# Patient Record
Sex: Female | Born: 1940 | Race: White | Hispanic: No | Smoking: Never smoker
Health system: Southern US, Community
[De-identification: ages and names within clinical notes are randomized; demographics above are authoritative.]

## PROBLEM LIST (undated history)

## (undated) DIAGNOSIS — F329 Major depressive disorder, single episode, unspecified: Secondary | ICD-10-CM

## (undated) DIAGNOSIS — K219 Gastro-esophageal reflux disease without esophagitis: Secondary | ICD-10-CM

## (undated) DIAGNOSIS — Z923 Personal history of irradiation: Secondary | ICD-10-CM

## (undated) DIAGNOSIS — G4700 Insomnia, unspecified: Secondary | ICD-10-CM

## (undated) DIAGNOSIS — K5909 Other constipation: Secondary | ICD-10-CM

## (undated) DIAGNOSIS — I4891 Unspecified atrial fibrillation: Secondary | ICD-10-CM

## (undated) DIAGNOSIS — I10 Essential (primary) hypertension: Secondary | ICD-10-CM

## (undated) DIAGNOSIS — I429 Cardiomyopathy, unspecified: Secondary | ICD-10-CM

## (undated) DIAGNOSIS — I071 Rheumatic tricuspid insufficiency: Secondary | ICD-10-CM

## (undated) DIAGNOSIS — C50919 Malignant neoplasm of unspecified site of unspecified female breast: Secondary | ICD-10-CM

## (undated) DIAGNOSIS — M1990 Unspecified osteoarthritis, unspecified site: Secondary | ICD-10-CM

## (undated) DIAGNOSIS — I340 Nonrheumatic mitral (valve) insufficiency: Secondary | ICD-10-CM

## (undated) DIAGNOSIS — F32A Depression, unspecified: Secondary | ICD-10-CM

## (undated) HISTORY — PX: VAGINAL HYSTERECTOMY: SHX2639

## (undated) HISTORY — PX: CATARACT EXTRACTION, BILATERAL: SHX1313

## (undated) HISTORY — DX: Insomnia, unspecified: G47.00

## (undated) HISTORY — PX: TONSILLECTOMY: SUR1361

## (undated) HISTORY — PX: LAPAROSCOPIC CHOLECYSTECTOMY: SUR755

## (undated) HISTORY — PX: BREAST SURGERY: SHX581

## (undated) HISTORY — PX: ABDOMINAL HYSTERECTOMY: SHX81

## (undated) HISTORY — DX: Depression, unspecified: F32.A

## (undated) HISTORY — DX: Major depressive disorder, single episode, unspecified: F32.9

## (undated) HISTORY — DX: Essential (primary) hypertension: I10

## (undated) HISTORY — DX: Gastro-esophageal reflux disease without esophagitis: K21.9

---

## 2003-07-06 LAB — HM MAMMOGRAPHY

## 2003-07-12 ENCOUNTER — Other Ambulatory Visit: Payer: Self-pay

## 2003-10-22 ENCOUNTER — Ambulatory Visit: Payer: Self-pay | Admitting: Radiation Oncology

## 2003-11-04 ENCOUNTER — Ambulatory Visit: Payer: Self-pay | Admitting: Unknown Physician Specialty

## 2003-11-22 ENCOUNTER — Ambulatory Visit: Payer: Self-pay | Admitting: Radiation Oncology

## 2004-01-04 ENCOUNTER — Ambulatory Visit: Payer: Self-pay | Admitting: General Surgery

## 2004-01-11 ENCOUNTER — Ambulatory Visit: Payer: Self-pay | Admitting: Oncology

## 2004-01-12 ENCOUNTER — Ambulatory Visit: Payer: Self-pay | Admitting: Unknown Physician Specialty

## 2004-01-12 LAB — HM COLONOSCOPY

## 2004-01-22 DIAGNOSIS — C50919 Malignant neoplasm of unspecified site of unspecified female breast: Secondary | ICD-10-CM

## 2004-01-22 DIAGNOSIS — Z923 Personal history of irradiation: Secondary | ICD-10-CM

## 2004-01-22 HISTORY — PX: BREAST BIOPSY: SHX20

## 2004-01-22 HISTORY — DX: Personal history of irradiation: Z92.3

## 2004-01-22 HISTORY — DX: Malignant neoplasm of unspecified site of unspecified female breast: C50.919

## 2004-01-22 HISTORY — PX: BREAST LUMPECTOMY: SHX2

## 2004-03-29 ENCOUNTER — Ambulatory Visit: Payer: Self-pay | Admitting: Oncology

## 2004-06-22 ENCOUNTER — Ambulatory Visit: Payer: Self-pay | Admitting: General Surgery

## 2004-07-11 ENCOUNTER — Ambulatory Visit: Payer: Self-pay | Admitting: Oncology

## 2004-10-04 ENCOUNTER — Ambulatory Visit: Payer: Self-pay | Admitting: Oncology

## 2005-01-04 ENCOUNTER — Ambulatory Visit: Payer: Self-pay | Admitting: General Surgery

## 2005-01-28 ENCOUNTER — Ambulatory Visit: Payer: Self-pay | Admitting: Oncology

## 2005-07-02 ENCOUNTER — Ambulatory Visit: Payer: Self-pay | Admitting: General Surgery

## 2005-07-15 ENCOUNTER — Ambulatory Visit: Payer: Self-pay | Admitting: Oncology

## 2007-06-17 ENCOUNTER — Ambulatory Visit: Payer: Self-pay | Admitting: Family Medicine

## 2008-01-07 ENCOUNTER — Ambulatory Visit: Payer: Self-pay | Admitting: Family Medicine

## 2010-09-16 ENCOUNTER — Ambulatory Visit: Payer: Self-pay | Admitting: Family Medicine

## 2011-02-25 DIAGNOSIS — M549 Dorsalgia, unspecified: Secondary | ICD-10-CM | POA: Diagnosis not present

## 2011-02-25 DIAGNOSIS — I10 Essential (primary) hypertension: Secondary | ICD-10-CM | POA: Diagnosis not present

## 2011-02-25 DIAGNOSIS — N390 Urinary tract infection, site not specified: Secondary | ICD-10-CM | POA: Diagnosis not present

## 2011-02-25 DIAGNOSIS — E785 Hyperlipidemia, unspecified: Secondary | ICD-10-CM | POA: Diagnosis not present

## 2011-02-25 DIAGNOSIS — K219 Gastro-esophageal reflux disease without esophagitis: Secondary | ICD-10-CM | POA: Diagnosis not present

## 2011-02-25 DIAGNOSIS — F329 Major depressive disorder, single episode, unspecified: Secondary | ICD-10-CM | POA: Diagnosis not present

## 2011-05-06 DIAGNOSIS — I10 Essential (primary) hypertension: Secondary | ICD-10-CM | POA: Diagnosis not present

## 2011-05-06 DIAGNOSIS — E785 Hyperlipidemia, unspecified: Secondary | ICD-10-CM | POA: Diagnosis not present

## 2011-06-05 DIAGNOSIS — H524 Presbyopia: Secondary | ICD-10-CM | POA: Diagnosis not present

## 2011-06-05 DIAGNOSIS — H04129 Dry eye syndrome of unspecified lacrimal gland: Secondary | ICD-10-CM | POA: Diagnosis not present

## 2011-06-05 DIAGNOSIS — Z961 Presence of intraocular lens: Secondary | ICD-10-CM | POA: Diagnosis not present

## 2011-08-19 DIAGNOSIS — Z853 Personal history of malignant neoplasm of breast: Secondary | ICD-10-CM | POA: Diagnosis not present

## 2011-08-19 DIAGNOSIS — D0590 Unspecified type of carcinoma in situ of unspecified breast: Secondary | ICD-10-CM | POA: Diagnosis not present

## 2011-08-19 DIAGNOSIS — I10 Essential (primary) hypertension: Secondary | ICD-10-CM | POA: Diagnosis not present

## 2011-08-19 DIAGNOSIS — F329 Major depressive disorder, single episode, unspecified: Secondary | ICD-10-CM | POA: Diagnosis not present

## 2011-08-19 DIAGNOSIS — M129 Arthropathy, unspecified: Secondary | ICD-10-CM | POA: Diagnosis not present

## 2011-08-19 DIAGNOSIS — N952 Postmenopausal atrophic vaginitis: Secondary | ICD-10-CM | POA: Diagnosis not present

## 2011-08-19 DIAGNOSIS — Z09 Encounter for follow-up examination after completed treatment for conditions other than malignant neoplasm: Secondary | ICD-10-CM | POA: Diagnosis not present

## 2011-12-04 DIAGNOSIS — F329 Major depressive disorder, single episode, unspecified: Secondary | ICD-10-CM | POA: Diagnosis not present

## 2011-12-04 DIAGNOSIS — E785 Hyperlipidemia, unspecified: Secondary | ICD-10-CM | POA: Diagnosis not present

## 2011-12-04 DIAGNOSIS — I10 Essential (primary) hypertension: Secondary | ICD-10-CM | POA: Diagnosis not present

## 2011-12-04 DIAGNOSIS — K219 Gastro-esophageal reflux disease without esophagitis: Secondary | ICD-10-CM | POA: Diagnosis not present

## 2012-06-17 DIAGNOSIS — H04129 Dry eye syndrome of unspecified lacrimal gland: Secondary | ICD-10-CM | POA: Diagnosis not present

## 2012-06-17 DIAGNOSIS — Z961 Presence of intraocular lens: Secondary | ICD-10-CM | POA: Diagnosis not present

## 2012-06-17 DIAGNOSIS — H524 Presbyopia: Secondary | ICD-10-CM | POA: Diagnosis not present

## 2012-08-20 DIAGNOSIS — E785 Hyperlipidemia, unspecified: Secondary | ICD-10-CM | POA: Diagnosis not present

## 2012-08-20 DIAGNOSIS — K219 Gastro-esophageal reflux disease without esophagitis: Secondary | ICD-10-CM | POA: Diagnosis not present

## 2012-08-20 DIAGNOSIS — F329 Major depressive disorder, single episode, unspecified: Secondary | ICD-10-CM | POA: Diagnosis not present

## 2012-08-20 DIAGNOSIS — E7800 Pure hypercholesterolemia, unspecified: Secondary | ICD-10-CM | POA: Diagnosis not present

## 2012-08-20 DIAGNOSIS — I10 Essential (primary) hypertension: Secondary | ICD-10-CM | POA: Diagnosis not present

## 2012-09-07 DIAGNOSIS — J040 Acute laryngitis: Secondary | ICD-10-CM | POA: Diagnosis not present

## 2012-09-07 DIAGNOSIS — J40 Bronchitis, not specified as acute or chronic: Secondary | ICD-10-CM | POA: Diagnosis not present

## 2012-11-25 ENCOUNTER — Ambulatory Visit: Payer: Self-pay | Admitting: Family Medicine

## 2012-11-25 DIAGNOSIS — M5137 Other intervertebral disc degeneration, lumbosacral region: Secondary | ICD-10-CM | POA: Diagnosis not present

## 2012-11-25 DIAGNOSIS — IMO0002 Reserved for concepts with insufficient information to code with codable children: Secondary | ICD-10-CM | POA: Diagnosis not present

## 2012-11-25 DIAGNOSIS — M47817 Spondylosis without myelopathy or radiculopathy, lumbosacral region: Secondary | ICD-10-CM | POA: Diagnosis not present

## 2012-12-10 DIAGNOSIS — IMO0002 Reserved for concepts with insufficient information to code with codable children: Secondary | ICD-10-CM | POA: Diagnosis not present

## 2012-12-10 DIAGNOSIS — M461 Sacroiliitis, not elsewhere classified: Secondary | ICD-10-CM | POA: Diagnosis not present

## 2012-12-10 DIAGNOSIS — B370 Candidal stomatitis: Secondary | ICD-10-CM | POA: Diagnosis not present

## 2013-01-01 DIAGNOSIS — J029 Acute pharyngitis, unspecified: Secondary | ICD-10-CM | POA: Diagnosis not present

## 2013-01-01 DIAGNOSIS — Z23 Encounter for immunization: Secondary | ICD-10-CM | POA: Diagnosis not present

## 2013-01-01 DIAGNOSIS — L439 Lichen planus, unspecified: Secondary | ICD-10-CM | POA: Diagnosis not present

## 2013-01-01 DIAGNOSIS — L259 Unspecified contact dermatitis, unspecified cause: Secondary | ICD-10-CM | POA: Diagnosis not present

## 2013-01-01 DIAGNOSIS — E785 Hyperlipidemia, unspecified: Secondary | ICD-10-CM | POA: Diagnosis not present

## 2013-01-01 DIAGNOSIS — Z79899 Other long term (current) drug therapy: Secondary | ICD-10-CM | POA: Diagnosis not present

## 2013-01-01 LAB — HEMOGLOBIN A1C: Hgb A1c MFr Bld: 5.4 % (ref 4.0–6.0)

## 2013-05-11 DIAGNOSIS — I10 Essential (primary) hypertension: Secondary | ICD-10-CM | POA: Diagnosis not present

## 2013-05-11 DIAGNOSIS — F329 Major depressive disorder, single episode, unspecified: Secondary | ICD-10-CM | POA: Diagnosis not present

## 2013-05-11 DIAGNOSIS — E785 Hyperlipidemia, unspecified: Secondary | ICD-10-CM | POA: Diagnosis not present

## 2013-05-11 DIAGNOSIS — K219 Gastro-esophageal reflux disease without esophagitis: Secondary | ICD-10-CM | POA: Diagnosis not present

## 2013-05-11 DIAGNOSIS — F3289 Other specified depressive episodes: Secondary | ICD-10-CM | POA: Diagnosis not present

## 2013-05-28 DIAGNOSIS — D1039 Benign neoplasm of other parts of mouth: Secondary | ICD-10-CM | POA: Diagnosis not present

## 2013-06-03 DIAGNOSIS — L439 Lichen planus, unspecified: Secondary | ICD-10-CM | POA: Diagnosis not present

## 2013-06-04 DIAGNOSIS — L821 Other seborrheic keratosis: Secondary | ICD-10-CM | POA: Diagnosis not present

## 2013-06-04 DIAGNOSIS — H16139 Photokeratitis, unspecified eye: Secondary | ICD-10-CM | POA: Diagnosis not present

## 2013-06-07 DIAGNOSIS — L439 Lichen planus, unspecified: Secondary | ICD-10-CM | POA: Diagnosis not present

## 2013-07-12 DIAGNOSIS — D485 Neoplasm of uncertain behavior of skin: Secondary | ICD-10-CM | POA: Diagnosis not present

## 2013-07-12 DIAGNOSIS — C44319 Basal cell carcinoma of skin of other parts of face: Secondary | ICD-10-CM | POA: Diagnosis not present

## 2013-07-12 DIAGNOSIS — L570 Actinic keratosis: Secondary | ICD-10-CM | POA: Diagnosis not present

## 2013-07-12 DIAGNOSIS — L821 Other seborrheic keratosis: Secondary | ICD-10-CM | POA: Diagnosis not present

## 2013-07-16 ENCOUNTER — Ambulatory Visit: Payer: Self-pay | Admitting: Family Medicine

## 2013-07-16 DIAGNOSIS — Z7982 Long term (current) use of aspirin: Secondary | ICD-10-CM | POA: Diagnosis not present

## 2013-07-16 DIAGNOSIS — J329 Chronic sinusitis, unspecified: Secondary | ICD-10-CM | POA: Diagnosis not present

## 2013-07-16 DIAGNOSIS — Z79899 Other long term (current) drug therapy: Secondary | ICD-10-CM | POA: Diagnosis not present

## 2013-08-30 DIAGNOSIS — C44319 Basal cell carcinoma of skin of other parts of face: Secondary | ICD-10-CM | POA: Diagnosis not present

## 2013-10-11 DIAGNOSIS — C4491 Basal cell carcinoma of skin, unspecified: Secondary | ICD-10-CM | POA: Diagnosis not present

## 2013-10-11 DIAGNOSIS — D485 Neoplasm of uncertain behavior of skin: Secondary | ICD-10-CM | POA: Diagnosis not present

## 2013-10-11 DIAGNOSIS — Z85828 Personal history of other malignant neoplasm of skin: Secondary | ICD-10-CM | POA: Diagnosis not present

## 2013-10-11 DIAGNOSIS — Z1283 Encounter for screening for malignant neoplasm of skin: Secondary | ICD-10-CM | POA: Diagnosis not present

## 2013-10-11 DIAGNOSIS — L570 Actinic keratosis: Secondary | ICD-10-CM | POA: Diagnosis not present

## 2013-10-18 DIAGNOSIS — L570 Actinic keratosis: Secondary | ICD-10-CM | POA: Diagnosis not present

## 2013-11-15 DIAGNOSIS — L570 Actinic keratosis: Secondary | ICD-10-CM | POA: Diagnosis not present

## 2013-12-02 ENCOUNTER — Ambulatory Visit: Payer: Self-pay | Admitting: Family Medicine

## 2013-12-02 DIAGNOSIS — G8929 Other chronic pain: Secondary | ICD-10-CM | POA: Diagnosis not present

## 2013-12-02 DIAGNOSIS — E784 Other hyperlipidemia: Secondary | ICD-10-CM | POA: Diagnosis not present

## 2013-12-02 DIAGNOSIS — I10 Essential (primary) hypertension: Secondary | ICD-10-CM | POA: Diagnosis not present

## 2013-12-02 DIAGNOSIS — F329 Major depressive disorder, single episode, unspecified: Secondary | ICD-10-CM | POA: Diagnosis not present

## 2013-12-02 DIAGNOSIS — Z780 Asymptomatic menopausal state: Secondary | ICD-10-CM | POA: Diagnosis not present

## 2013-12-02 DIAGNOSIS — M1990 Unspecified osteoarthritis, unspecified site: Secondary | ICD-10-CM | POA: Diagnosis not present

## 2013-12-02 DIAGNOSIS — J329 Chronic sinusitis, unspecified: Secondary | ICD-10-CM | POA: Diagnosis not present

## 2013-12-02 DIAGNOSIS — K219 Gastro-esophageal reflux disease without esophagitis: Secondary | ICD-10-CM | POA: Diagnosis not present

## 2013-12-02 DIAGNOSIS — M549 Dorsalgia, unspecified: Secondary | ICD-10-CM | POA: Diagnosis not present

## 2013-12-02 DIAGNOSIS — M5135 Other intervertebral disc degeneration, thoracolumbar region: Secondary | ICD-10-CM | POA: Diagnosis not present

## 2013-12-02 DIAGNOSIS — M545 Low back pain: Secondary | ICD-10-CM | POA: Diagnosis not present

## 2013-12-02 DIAGNOSIS — M5136 Other intervertebral disc degeneration, lumbar region: Secondary | ICD-10-CM | POA: Diagnosis not present

## 2013-12-02 LAB — LIPID PANEL
Cholesterol: 215 mg/dL — AB (ref 0–200)
HDL: 74 mg/dL — AB (ref 35–70)
LDL Cholesterol: 106 mg/dL
Triglycerides: 174 mg/dL — AB (ref 40–160)

## 2013-12-02 LAB — BASIC METABOLIC PANEL
BUN: 12 mg/dL (ref 4–21)
Creatinine: 0.8 mg/dL (ref <=1.1)
Glucose: 72 mg/dL

## 2014-01-24 DIAGNOSIS — Z08 Encounter for follow-up examination after completed treatment for malignant neoplasm: Secondary | ICD-10-CM | POA: Diagnosis not present

## 2014-01-24 DIAGNOSIS — L570 Actinic keratosis: Secondary | ICD-10-CM | POA: Diagnosis not present

## 2014-01-24 DIAGNOSIS — Z85828 Personal history of other malignant neoplasm of skin: Secondary | ICD-10-CM | POA: Diagnosis not present

## 2014-01-24 DIAGNOSIS — C44311 Basal cell carcinoma of skin of nose: Secondary | ICD-10-CM | POA: Diagnosis not present

## 2014-01-24 DIAGNOSIS — Z1283 Encounter for screening for malignant neoplasm of skin: Secondary | ICD-10-CM | POA: Diagnosis not present

## 2014-01-31 DIAGNOSIS — L570 Actinic keratosis: Secondary | ICD-10-CM | POA: Diagnosis not present

## 2014-05-19 DIAGNOSIS — F339 Major depressive disorder, recurrent, unspecified: Secondary | ICD-10-CM | POA: Diagnosis not present

## 2014-05-19 DIAGNOSIS — Z634 Disappearance and death of family member: Secondary | ICD-10-CM | POA: Diagnosis not present

## 2014-05-19 DIAGNOSIS — G4700 Insomnia, unspecified: Secondary | ICD-10-CM | POA: Diagnosis not present

## 2014-06-06 DIAGNOSIS — F4321 Adjustment disorder with depressed mood: Secondary | ICD-10-CM | POA: Insufficient documentation

## 2014-06-06 DIAGNOSIS — F339 Major depressive disorder, recurrent, unspecified: Secondary | ICD-10-CM | POA: Insufficient documentation

## 2014-06-06 DIAGNOSIS — K219 Gastro-esophageal reflux disease without esophagitis: Secondary | ICD-10-CM | POA: Insufficient documentation

## 2014-06-06 DIAGNOSIS — E7849 Other hyperlipidemia: Secondary | ICD-10-CM | POA: Insufficient documentation

## 2014-06-06 DIAGNOSIS — M1990 Unspecified osteoarthritis, unspecified site: Secondary | ICD-10-CM | POA: Insufficient documentation

## 2014-06-06 DIAGNOSIS — L309 Dermatitis, unspecified: Secondary | ICD-10-CM | POA: Insufficient documentation

## 2014-06-06 DIAGNOSIS — Z780 Asymptomatic menopausal state: Secondary | ICD-10-CM | POA: Insufficient documentation

## 2014-06-06 DIAGNOSIS — G4700 Insomnia, unspecified: Secondary | ICD-10-CM | POA: Insufficient documentation

## 2014-06-06 DIAGNOSIS — I10 Essential (primary) hypertension: Secondary | ICD-10-CM | POA: Insufficient documentation

## 2014-06-30 ENCOUNTER — Ambulatory Visit (INDEPENDENT_AMBULATORY_CARE_PROVIDER_SITE_OTHER): Payer: Medicare Other | Admitting: Family Medicine

## 2014-06-30 ENCOUNTER — Encounter: Payer: Self-pay | Admitting: Family Medicine

## 2014-06-30 VITALS — BP 138/80 | HR 78 | Ht 65.0 in | Wt 221.0 lb

## 2014-06-30 DIAGNOSIS — F32A Depression, unspecified: Secondary | ICD-10-CM

## 2014-06-30 DIAGNOSIS — F339 Major depressive disorder, recurrent, unspecified: Secondary | ICD-10-CM | POA: Diagnosis not present

## 2014-06-30 DIAGNOSIS — G4700 Insomnia, unspecified: Secondary | ICD-10-CM

## 2014-06-30 DIAGNOSIS — F329 Major depressive disorder, single episode, unspecified: Secondary | ICD-10-CM | POA: Diagnosis not present

## 2014-06-30 MED ORDER — ZOLPIDEM TARTRATE 5 MG PO TABS: 5.0000 mg | ORAL_TABLET | Freq: Every day | ORAL | Status: DC

## 2014-06-30 MED ORDER — SERTRALINE HCL 100 MG PO TABS: 100.0000 mg | ORAL_TABLET | Freq: Every day | ORAL | Status: DC

## 2014-06-30 NOTE — Progress Notes (Signed)
Name: Melissa Chase   MRN: [id number]    DOB: [birth year]   Date:06/30/2014       Progress Note  Subjective  Chief Complaint  Chief Complaint  Patient presents with  . Depression    med helping- "just feel jittery"    Mental Health Problem The primary symptoms include dysphoric mood. The primary symptoms do not include delusions, hallucinations, bizarre behavior, disorganized speech, negative symptoms or somatic symptoms. The current episode started more than 1 month ago.  The onset of the illness is precipitated by emotional stress. The degree of incapacity that she is experiencing as a consequence of her illness is moderate. Additional symptoms of the illness include insomnia. Additional symptoms of the illness do not include no anhedonia, no hypersomnia, no appetite change, no unexpected weight change, no fatigue, no agitation, no psychomotor retardation, no feelings of worthlessness, no attention impairment, no euphoric mood, no increased goal-directed activity, no flight of ideas, no inflated self-esteem, no decreased need for sleep, not distractible, no poor judgment, no visual change, no headaches, no abdominal pain or no seizures. She does not admit to suicidal ideas. She does not have a plan to commit suicide. She does not contemplate harming herself.    No problem-specific assessment & plan notes found for this encounter.   No past medical history on file.  Past Surgical History  Procedure Laterality Date  . Vaginal hysterectomy    . Laparoscopic cholecystectomy    . Tonsillectomy      No family history on file.  History   Social History  . Marital Status: Married    Spouse Name: N/A  . Number of Children: N/A  . Years of Education: N/A   Occupational History  . Not on file.   Social History Main Topics  . Smoking status: Never Smoker   . Smokeless tobacco: Not on file  . Alcohol Use: 0.0 oz/week    0 Standard drinks or equivalent per week  . Drug Use: No  .  Sexual Activity: Not on file   Other Topics Concern  . Not on file   Social History Narrative    No Known Allergies   Review of Systems  Constitutional: Negative for appetite change, fatigue and unexpected weight change.  Gastrointestinal: Negative for abdominal pain.  Neurological: Negative for seizures and headaches.  Psychiatric/Behavioral: Positive for dysphoric mood. Negative for hallucinations and agitation. The patient has insomnia.      Objective  Filed Vitals:   06/30/14 1513  BP: 138/80  Pulse: 78  Height: 5\' 5"  (1.651 m)  Weight: 221 lb (100.245 kg)    Physical Exam    No results found for this or any previous visit (from the past 2160 hour(s)).   Assessment & Plan  Problem List Items Addressed This Visit      Other   Recurrent major depressive episodes   Relevant Medications   sertraline (ZOLOFT) 100 MG tablet    Other Visit Diagnoses    Depression    -  Primary    Relevant Medications    sertraline (ZOLOFT) 100 MG tablet    Insomnia        Relevant Medications    zolpidem (AMBIEN) 5 MG tablet         Dr. [facility]  06/30/2014

## 2014-07-08 ENCOUNTER — Other Ambulatory Visit: Payer: Self-pay | Admitting: Family Medicine

## 2014-07-08 DIAGNOSIS — K219 Gastro-esophageal reflux disease without esophagitis: Secondary | ICD-10-CM

## 2014-07-10 ENCOUNTER — Other Ambulatory Visit: Payer: Self-pay | Admitting: Family Medicine

## 2014-07-10 DIAGNOSIS — M2550 Pain in unspecified joint: Secondary | ICD-10-CM

## 2014-07-20 DIAGNOSIS — H0289 Other specified disorders of eyelid: Secondary | ICD-10-CM | POA: Diagnosis not present

## 2014-07-20 DIAGNOSIS — H5213 Myopia, bilateral: Secondary | ICD-10-CM | POA: Diagnosis not present

## 2014-08-11 ENCOUNTER — Ambulatory Visit (INDEPENDENT_AMBULATORY_CARE_PROVIDER_SITE_OTHER): Payer: Medicare Other | Admitting: Family Medicine

## 2014-08-11 VITALS — BP 115/65 | HR 89 | Temp 98.0°F | Resp 16 | Wt 210.0 lb

## 2014-08-11 DIAGNOSIS — F339 Major depressive disorder, recurrent, unspecified: Secondary | ICD-10-CM | POA: Diagnosis not present

## 2014-08-11 DIAGNOSIS — F329 Major depressive disorder, single episode, unspecified: Secondary | ICD-10-CM | POA: Diagnosis not present

## 2014-08-11 DIAGNOSIS — F4321 Adjustment disorder with depressed mood: Secondary | ICD-10-CM

## 2014-08-11 DIAGNOSIS — F32A Depression, unspecified: Secondary | ICD-10-CM

## 2014-08-11 DIAGNOSIS — G4700 Insomnia, unspecified: Secondary | ICD-10-CM | POA: Diagnosis not present

## 2014-08-11 MED ORDER — ZOLPIDEM TARTRATE 5 MG PO TABS: 5.0000 mg | ORAL_TABLET | Freq: Every day | ORAL | Status: DC

## 2014-08-11 MED ORDER — SERTRALINE HCL 100 MG PO TABS: 100.0000 mg | ORAL_TABLET | Freq: Every day | ORAL | Status: DC

## 2014-08-11 NOTE — Progress Notes (Signed)
Name: Melissa Chase   MRN: [id number]    DOB: 07-12-1940   Date:08/11/2014       Progress Note  Subjective  Chief Complaint  Chief Complaint  Patient presents with  . Follow-up    Mental Health Problem The primary symptoms include dysphoric mood. The primary symptoms do not include delusions, hallucinations, bizarre behavior, disorganized speech, negative symptoms or somatic symptoms. The current episode started more than 1 month ago. This is a recurrent problem.  The onset of the illness is precipitated by a stressful event. The degree of incapacity that she is experiencing as a consequence of her illness is mild. Additional symptoms of the illness include anhedonia. Additional symptoms of the illness do not include no insomnia, no hypersomnia, no appetite change, no unexpected weight change, no fatigue, no agitation, no psychomotor retardation, no feelings of worthlessness, no attention impairment, no euphoric mood, no increased goal-directed activity, no flight of ideas, no inflated self-esteem, no decreased need for sleep, not distractible, no poor judgment, no visual change, no headaches, no abdominal pain or no seizures. She does not admit to suicidal ideas. She does not have a plan to commit suicide. She does not contemplate harming herself. She has not already injured self. She does not contemplate injuring another person. She has not already  injured another person.    No problem-specific assessment & plan notes found for this encounter.   No past medical history on file.  Past Surgical History  Procedure Laterality Date  . Vaginal hysterectomy    . Laparoscopic cholecystectomy    . Tonsillectomy      No family history on file.  History   Social History  . Marital Status: Married    Spouse Name: N/A  . Number of Children: N/A  . Years of Education: N/A   Occupational History  . Not on file.   Social History Main Topics  . Smoking status: Never Smoker   .  Smokeless tobacco: Not on file  . Alcohol Use: 0.0 oz/week    0 Standard drinks or equivalent per week  . Drug Use: No  . Sexual Activity: Not on file   Other Topics Concern  . Not on file   Social History Narrative    No Known Allergies   Review of Systems  Constitutional: Negative for fever, chills, weight loss, malaise/fatigue, appetite change, fatigue and unexpected weight change.  HENT: Negative for ear discharge, ear pain and sore throat.   Eyes: Negative for blurred vision.  Respiratory: Negative for cough, sputum production, shortness of breath and wheezing.   Cardiovascular: Negative for chest pain, palpitations and leg swelling.  Gastrointestinal: Negative for heartburn, nausea, abdominal pain, diarrhea, constipation, blood in stool and melena.  Genitourinary: Negative for dysuria, urgency, frequency and hematuria.  Musculoskeletal: Negative for myalgias, back pain, joint pain and neck pain.  Skin: Negative for rash.  Neurological: Negative for dizziness, tingling, sensory change, focal weakness, seizures and headaches.  Endo/Heme/Allergies: Negative for environmental allergies and polydipsia. Does not bruise/bleed easily.  Psychiatric/Behavioral: Positive for dysphoric mood. Negative for depression, suicidal ideas, hallucinations and agitation. The patient is not nervous/anxious and does not have insomnia.      Objective  Filed Vitals:   08/11/14 1412  BP: 115/65  Pulse: 89  Temp: 98 F (36.7 C)  Resp: 16  Weight: 210 lb (95.255 kg)  SpO2: 97%    Physical Exam  Constitutional: She is well-developed, well-nourished, and in no distress. No distress.  HENT:  Head:  Normocephalic and atraumatic.  Right Ear: External ear normal.  Left Ear: External ear normal.  Nose: Nose normal.  Mouth/Throat: Oropharynx is clear and moist.  Eyes: Conjunctivae and EOM are normal. Pupils are equal, round, and reactive to light. Right eye exhibits no discharge. Left eye  exhibits no discharge.  Neck: Normal range of motion. Neck supple. No JVD present. No thyromegaly present.  Cardiovascular: Normal rate, regular rhythm, normal heart sounds and intact distal pulses.  Exam reveals no gallop and no friction rub.   No murmur heard. Pulmonary/Chest: Effort normal and breath sounds normal.  Abdominal: Soft. Bowel sounds are normal. She exhibits no mass. There is no tenderness. There is no guarding.  Musculoskeletal: Normal range of motion. She exhibits no edema.  Lymphadenopathy:    She has no cervical adenopathy.  Neurological: She is alert. She has normal reflexes.  Skin: Skin is warm and dry. She is not diaphoretic.  Psychiatric: Mood and affect normal.      Assessment & Plan  Problem List Items Addressed This Visit      Other   Aggrieved - Primary   Recurrent major depressive episodes   Relevant Medications   sertraline (ZOLOFT) 100 MG tablet   Cannot sleep    Other Visit Diagnoses    Depression        Relevant Medications    sertraline (ZOLOFT) 100 MG tablet    Insomnia        Relevant Medications    zolpidem (AMBIEN) 5 MG tablet         Dr. Deanna [facility]  08/11/2014

## 2014-08-12 ENCOUNTER — Encounter: Payer: Self-pay | Admitting: Family Medicine

## 2014-08-12 ENCOUNTER — Other Ambulatory Visit: Payer: Self-pay

## 2014-08-15 ENCOUNTER — Other Ambulatory Visit: Payer: Self-pay

## 2014-10-09 ENCOUNTER — Other Ambulatory Visit: Payer: Self-pay | Admitting: Family Medicine

## 2014-10-09 DIAGNOSIS — I10 Essential (primary) hypertension: Secondary | ICD-10-CM

## 2014-10-10 ENCOUNTER — Encounter: Payer: Self-pay | Admitting: Family Medicine

## 2014-10-10 ENCOUNTER — Ambulatory Visit (INDEPENDENT_AMBULATORY_CARE_PROVIDER_SITE_OTHER): Payer: Medicare Other | Admitting: Family Medicine

## 2014-10-10 VITALS — BP 120/70 | HR 80 | Ht 65.0 in | Wt 231.0 lb

## 2014-10-10 DIAGNOSIS — F32A Depression, unspecified: Secondary | ICD-10-CM

## 2014-10-10 DIAGNOSIS — G4700 Insomnia, unspecified: Secondary | ICD-10-CM

## 2014-10-10 DIAGNOSIS — F339 Major depressive disorder, recurrent, unspecified: Secondary | ICD-10-CM | POA: Diagnosis not present

## 2014-10-10 DIAGNOSIS — F329 Major depressive disorder, single episode, unspecified: Secondary | ICD-10-CM | POA: Diagnosis not present

## 2014-10-10 MED ORDER — ZOLPIDEM TARTRATE 5 MG PO TABS: 5.0000 mg | ORAL_TABLET | Freq: Every day | ORAL | Status: DC

## 2014-10-10 MED ORDER — SERTRALINE HCL 100 MG PO TABS: 100.0000 mg | ORAL_TABLET | Freq: Every day | ORAL | Status: DC

## 2014-10-10 NOTE — Progress Notes (Signed)
Name: Melissa Chase   MRN: [id number]    DOB: 1940/04/02   Date:10/10/2014       Progress Note  Subjective  Chief Complaint  Chief Complaint  Patient presents with  . Depression  . Insomnia    Depression        This is a chronic problem.  The current episode started more than 1 year ago.   The onset quality is gradual.   The problem occurs intermittently.  The problem has been gradually improving since onset.  Associated symptoms include insomnia.  Associated symptoms include no decreased concentration, no fatigue, no helplessness, no hopelessness, not irritable, no restlessness, no decreased interest, no appetite change, no body aches, no myalgias, no headaches, no indigestion and no suicidal ideas.     The symptoms are aggravated by nothing.  Past treatments include SSRIs - Selective serotonin reuptake inhibitors.  Compliance with treatment is good.  Previous treatment provided mild relief.   Pertinent negatives include no chronic fatigue syndrome, no chronic pain, no fibromyalgia, no chronic illness and no obsessive-compulsive disorder. Insomnia Primary symptoms: difficulty falling asleep, somnolence, frequent awakening, malaise/fatigue.  The current episode started more than one month. The onset quality is gradual. The problem occurs nightly. The problem has been gradually improving ("better than I was") since onset. The symptoms are aggravated by anxiety. Past treatments include medication. The treatment provided significant relief. Typical bedtime:  11-12 P.M..  How long after going to bed to you fall asleep: 15-30 minutes.   PMH includes: depression, family stress or anxiety.    No problem-specific assessment & plan notes found for this encounter.   Past Medical History  Diagnosis Date  . Depression   . Insomnia   . Hypertension   . GERD (gastroesophageal reflux disease)     Past Surgical History  Procedure Laterality Date  . Vaginal hysterectomy    . Laparoscopic  cholecystectomy    . Tonsillectomy      Family History  Problem Relation Age of Onset  . Diabetes Sister     Social History   Social History  . Marital Status: Married    Spouse Name: N/A  . Number of Children: N/A  . Years of Education: N/A   Occupational History  . Not on file.   Social History Main Topics  . Smoking status: Never Smoker   . Smokeless tobacco: Not on file  . Alcohol Use: 0.0 oz/week    0 Standard drinks or equivalent per week  . Drug Use: No  . Sexual Activity: No   Other Topics Concern  . Not on file   Social History Narrative    No Known Allergies   Review of Systems  Constitutional: Positive for malaise/fatigue. Negative for fever, chills, weight loss, appetite change and fatigue.  HENT: Negative for ear discharge, ear pain and sore throat.   Eyes: Negative for blurred vision.  Respiratory: Negative for cough, sputum production, shortness of breath and wheezing.   Cardiovascular: Negative for chest pain, palpitations and leg swelling.  Gastrointestinal: Negative for heartburn, nausea, abdominal pain, diarrhea, constipation, blood in stool and melena.  Genitourinary: Negative for dysuria, urgency, frequency and hematuria.  Musculoskeletal: Negative for myalgias, back pain, joint pain and neck pain.  Skin: Negative for rash.  Neurological: Negative for dizziness, tingling, sensory change, focal weakness and headaches.  Endo/Heme/Allergies: Negative for environmental allergies and polydipsia. Does not bruise/bleed easily.  Psychiatric/Behavioral: Positive for depression. Negative for suicidal ideas and decreased concentration. The patient has insomnia.  The patient is not nervous/anxious.      Objective  Filed Vitals:   10/10/14 1340  BP: 120/70  Pulse: 80  Height: 5\' 5"  (1.651 m)  Weight: 231 lb (104.781 kg)    Physical Exam  Constitutional: She is well-developed, well-nourished, and in no distress. She is not irritable. No distress.   HENT:  Head: Normocephalic and atraumatic.  Right Ear: External ear normal.  Left Ear: External ear normal.  Nose: Nose normal.  Mouth/Throat: Oropharynx is clear and moist.  Eyes: Conjunctivae and EOM are normal. Pupils are equal, round, and reactive to light. Right eye exhibits no discharge. Left eye exhibits no discharge.  Neck: Normal range of motion. Neck supple. No JVD present. No thyromegaly present.  Cardiovascular: Normal rate, regular rhythm, normal heart sounds and intact distal pulses.  Exam reveals no gallop and no friction rub.   No murmur heard. Pulmonary/Chest: Effort normal and breath sounds normal.  Abdominal: Soft. Bowel sounds are normal. She exhibits no mass. There is no tenderness. There is no guarding.  Musculoskeletal: Normal range of motion. She exhibits no edema.  Lymphadenopathy:    She has no cervical adenopathy.  Neurological: She is alert. She has normal reflexes.  Skin: Skin is warm and dry. She is not diaphoretic.  Psychiatric: Mood and affect normal.  Nursing note and vitals reviewed.     Assessment & Plan  Problem List Items Addressed This Visit      Other   Recurrent major depressive episodes - Primary   Relevant Medications   sertraline (ZOLOFT) 100 MG tablet   Cannot sleep    Other Visit Diagnoses    Depression        Relevant Medications    sertraline (ZOLOFT) 100 MG tablet    Insomnia        Relevant Medications    zolpidem (AMBIEN) 5 MG tablet         Dr. [facility]  10/10/2014

## 2014-10-10 NOTE — Patient Instructions (Signed)
Insomnia Insomnia is frequent trouble falling and/or staying asleep. Insomnia can be a long term problem or a short term problem. Both are common. Insomnia can be a short term problem when the wakefulness is related to a certain stress or worry. Long term insomnia is often related to ongoing stress during waking hours and/or poor sleeping habits. Overtime, sleep deprivation itself can make the problem worse. Every little thing feels more severe because you are overtired and your ability to cope is decreased. CAUSES   Stress, anxiety, and depression.  Poor sleeping habits.  Distractions such as TV in the bedroom.  Naps close to bedtime.  Engaging in emotionally charged conversations before bed.  Technical reading before sleep.  Alcohol and other sedatives. They may make the problem worse. They can hurt normal sleep patterns and normal dream activity.  Stimulants such as caffeine for several hours prior to bedtime.  Pain syndromes and shortness of breath can cause insomnia.  Exercise late at night.  Changing time zones may cause sleeping problems (jet lag). It is sometimes helpful to have someone observe your sleeping patterns. They should look for periods of not breathing during the night (sleep apnea). They should also look to see how long those periods last. If you live alone or observers are uncertain, you can also be observed at a sleep clinic where your sleep patterns will be professionally monitored. Sleep apnea requires a checkup and treatment. Give your caregivers your medical history. Give your caregivers observations your family has made about your sleep.  SYMPTOMS   Not feeling rested in the morning.  Anxiety and restlessness at bedtime.  Difficulty falling and staying asleep. TREATMENT   Your caregiver may prescribe treatment for an underlying medical disorders. Your caregiver can give advice or help if you are using alcohol or other drugs for self-medication. Treatment  of underlying problems will usually eliminate insomnia problems.  Medications can be prescribed for short time use. They are generally not recommended for lengthy use.  Over-the-counter sleep medicines are not recommended for lengthy use. They can be habit forming.  You can promote easier sleeping by making lifestyle changes such as:  Using relaxation techniques that help with breathing and reduce muscle tension.  Exercising earlier in the day.  Changing your diet and the time of your last meal. No night time snacks.  Establish a regular time to go to bed.  Counseling can help with stressful problems and worry.  Soothing music and white noise may be helpful if there are background noises you cannot remove.  Stop tedious detailed work at least one hour before bedtime. HOME CARE INSTRUCTIONS   Keep a diary. Inform your caregiver about your progress. This includes any medication side effects. See your caregiver regularly. Take note of:  Times when you are asleep.  Times when you are awake during the night.  The quality of your sleep.  How you feel the next day. This information will help your caregiver care for you.  Get out of bed if you are still awake after 15 minutes. Read or do some quiet activity. Keep the lights down. Wait until you feel sleepy and go back to bed.  Keep regular sleeping and waking hours. Avoid naps.  Exercise regularly.  Avoid distractions at bedtime. Distractions include watching television or engaging in any intense or detailed activity like attempting to balance the household checkbook.  Develop a bedtime ritual. Keep a familiar routine of bathing, brushing your teeth, climbing into bed at the same   time each night, listening to soothing music. Routines increase the success of falling to sleep faster.  Use relaxation techniques. This can be using breathing and muscle tension release routines. It can also include visualizing peaceful scenes. You can  also help control troubling or intruding thoughts by keeping your mind occupied with boring or repetitive thoughts like the old concept of counting sheep. You can make it more creative like imagining planting one beautiful flower after another in your backyard garden.  During your day, work to eliminate stress. When this is not possible use some of the previous suggestions to help reduce the anxiety that accompanies stressful situations. MAKE SURE YOU:   Understand these instructions.  Will watch your condition.  Will get help right away if you are not doing well or get worse. Document Released: 01/05/2000 Document Revised: 04/01/2011 Document Reviewed: 02/04/2007 Exit[employer] Patient Information 2015 [employer]. This information is not intended to replace advice given to you by your health care provider. Make sure you discuss any questions you have with your health care provider.  

## 2014-11-07 ENCOUNTER — Other Ambulatory Visit: Payer: Self-pay | Admitting: Family Medicine

## 2014-12-05 ENCOUNTER — Encounter: Payer: Self-pay | Admitting: Family Medicine

## 2014-12-05 ENCOUNTER — Ambulatory Visit (INDEPENDENT_AMBULATORY_CARE_PROVIDER_SITE_OTHER): Payer: Medicare Other | Admitting: Family Medicine

## 2014-12-05 VITALS — BP 118/70 | HR 80 | Ht 65.0 in | Wt 228.0 lb

## 2014-12-05 DIAGNOSIS — Z1211 Encounter for screening for malignant neoplasm of colon: Secondary | ICD-10-CM | POA: Diagnosis not present

## 2014-12-05 DIAGNOSIS — Z1231 Encounter for screening mammogram for malignant neoplasm of breast: Secondary | ICD-10-CM

## 2014-12-05 DIAGNOSIS — R195 Other fecal abnormalities: Secondary | ICD-10-CM | POA: Diagnosis not present

## 2014-12-05 DIAGNOSIS — E785 Hyperlipidemia, unspecified: Secondary | ICD-10-CM | POA: Diagnosis not present

## 2014-12-05 DIAGNOSIS — Z1239 Encounter for other screening for malignant neoplasm of breast: Secondary | ICD-10-CM | POA: Diagnosis not present

## 2014-12-05 DIAGNOSIS — Z0000 Encounter for general adult medical examination without abnormal findings: Secondary | ICD-10-CM

## 2014-12-05 DIAGNOSIS — I872 Venous insufficiency (chronic) (peripheral): Secondary | ICD-10-CM | POA: Diagnosis not present

## 2014-12-05 DIAGNOSIS — Z13220 Encounter for screening for lipoid disorders: Secondary | ICD-10-CM

## 2014-12-05 DIAGNOSIS — Z23 Encounter for immunization: Secondary | ICD-10-CM | POA: Diagnosis not present

## 2014-12-05 LAB — POCT URINALYSIS DIPSTICK
Bilirubin, UA: NEGATIVE
Blood, UA: NEGATIVE
Glucose, UA: NEGATIVE
Ketones, UA: NEGATIVE
Leukocytes, UA: NEGATIVE
Nitrite, UA: NEGATIVE
Protein, UA: NEGATIVE
Spec Grav, UA: 1.02
Urobilinogen, UA: 0.2
pH, UA: 6

## 2014-12-05 LAB — HEMOCCULT GUIAC POC 1CARD (OFFICE): Fecal Occult Blood, POC: POSITIVE — AB

## 2014-12-05 NOTE — Progress Notes (Signed)
Name: Millennium Sieker   MRN: [id number]    DOB: [birth year]   Date:12/05/2014       Progress Note  Subjective  Chief Complaint  Chief Complaint  Patient presents with  . Annual Exam    no pap    HPI Comments: Annual physical exam.  Patient relates no subjective nor objective concerns. Patient maintenance discusses.   No problem-specific assessment & plan notes found for this encounter.   Past Medical History  Diagnosis Date  . Depression   . Insomnia   . Hypertension   . GERD (gastroesophageal reflux disease)     Past Surgical History  Procedure Laterality Date  . Vaginal hysterectomy    . Laparoscopic cholecystectomy    . Tonsillectomy      Family History  Problem Relation Age of Onset  . Diabetes Sister     Social History   Social History  . Marital Status: Married    Spouse Name: N/A  . Number of Children: N/A  . Years of Education: N/A   Occupational History  . Not on file.   Social History Main Topics  . Smoking status: Never Smoker   . Smokeless tobacco: Not on file  . Alcohol Use: 0.0 oz/week    0 Standard drinks or equivalent per week  . Drug Use: No  . Sexual Activity: No   Other Topics Concern  . Not on file   Social History Narrative    No Known Allergies   Review of Systems  Constitutional: Negative for fever, chills, weight loss and malaise/fatigue.  HENT: Negative for ear discharge, ear pain and sore throat.   Eyes: Negative for blurred vision.  Respiratory: Negative for cough, sputum production, shortness of breath and wheezing.   Cardiovascular: Negative for chest pain, palpitations and leg swelling.  Gastrointestinal: Negative for heartburn, nausea, abdominal pain, diarrhea, constipation, blood in stool and melena.  Genitourinary: Negative for dysuria, urgency, frequency and hematuria.  Musculoskeletal: Negative for myalgias, back pain, joint pain and neck pain.  Skin: Negative for rash.  Neurological: Negative for  dizziness, tingling, sensory change, focal weakness and headaches.  Endo/Heme/Allergies: Negative for environmental allergies and polydipsia. Does not bruise/bleed easily.  Psychiatric/Behavioral: Negative for depression and suicidal ideas. The patient is not nervous/anxious and does not have insomnia.      Objective  Filed Vitals:   12/05/14 0934  BP: 118/70  Pulse: 80  Height: 5\' 5"  (1.651 m)  Weight: 228 lb (103.42 kg)    Physical Exam  Constitutional: She is well-developed, well-nourished, and in no distress. No distress.  HENT:  Head: Normocephalic and atraumatic.  Right Ear: External ear normal.  Left Ear: External ear normal.  Nose: Nose normal.  Mouth/Throat: Oropharynx is clear and moist.  Eyes: Conjunctivae and EOM are normal. Pupils are equal, round, and reactive to light. Right eye exhibits no discharge. Left eye exhibits no discharge.  Neck: Normal range of motion. Neck supple. No JVD present. No thyromegaly present.  Cardiovascular: Normal rate, regular rhythm, normal heart sounds and intact distal pulses.  Exam reveals no gallop and no friction rub.   No murmur heard. Pulmonary/Chest: Effort normal and breath sounds normal. Right breast exhibits no inverted nipple, no mass, no nipple discharge, no skin change and no tenderness. Left breast exhibits no inverted nipple, no mass, no nipple discharge, no skin change and no tenderness. Breasts are asymmetrical.  Previous bx scar  Abdominal: Soft. Bowel sounds are normal. She exhibits no mass. There is no  hepatosplenomegaly. There is no tenderness. There is no guarding.  Genitourinary: Rectal exam shows external hemorrhoid.  Musculoskeletal: Normal range of motion. She exhibits no edema.  Lymphadenopathy:    She has no cervical adenopathy.  Neurological: She is alert. She has normal reflexes.  Skin: Skin is warm and dry. She is not diaphoretic.  Psychiatric: Mood and affect normal.  Nursing note and vitals  reviewed.     Assessment & Plan  Problem List Items Addressed This Visit    None    Visit Diagnoses    Annual physical exam    -  Primary    Relevant Orders    Renal Function Panel    POCT Urinalysis Dipstick (Completed)    Venous insufficiency of left lower extremity        painful    Relevant Orders    Ambulatory referral to Vascular Surgery    Lipid screening        Relevant Orders    Lipid Profile    Colon cancer screening        Relevant Orders    POCT Occult Blood Stool (Completed)    Breast cancer screening        Relevant Orders    MM Digital Screening    Hyperlipidemia        Relevant Orders    Lipid Profile    Occult blood in stools        Relevant Orders    Ambulatory referral to Gastroenterology    Visit for screening mammogram        Relevant Orders    MM Digital Screening    Need for influenza vaccination        Relevant Orders    Flu Vaccine QUAD 36+ mos PF IM (Fluarix & Fluzone Quad PF) (Completed)         Dr. Seleni Meller [facility]  12/05/2014

## 2014-12-06 LAB — LIPID PANEL
Chol/HDL Ratio: 3.1 ratio units (ref 0.0–4.4)
Cholesterol, Total: 254 mg/dL — ABNORMAL HIGH (ref 100–199)
HDL: 83 mg/dL (ref >39)
LDL Calculated: 150 mg/dL — ABNORMAL HIGH (ref 0–99)
Triglycerides: 107 mg/dL (ref 0–149)
VLDL Cholesterol Cal: 21 mg/dL (ref 5–40)

## 2014-12-06 LAB — RENAL FUNCTION PANEL: Phosphorus: 3.1 mg/dL (ref 2.5–4.5)

## 2014-12-08 ENCOUNTER — Other Ambulatory Visit: Payer: Self-pay | Admitting: Family Medicine

## 2015-01-11 ENCOUNTER — Other Ambulatory Visit: Payer: Self-pay | Admitting: Family Medicine

## 2015-02-10 DIAGNOSIS — D485 Neoplasm of uncertain behavior of skin: Secondary | ICD-10-CM | POA: Diagnosis not present

## 2015-02-10 DIAGNOSIS — C4491 Basal cell carcinoma of skin, unspecified: Secondary | ICD-10-CM | POA: Diagnosis not present

## 2015-02-13 DIAGNOSIS — C44311 Basal cell carcinoma of skin of nose: Secondary | ICD-10-CM | POA: Diagnosis not present

## 2015-02-13 DIAGNOSIS — D485 Neoplasm of uncertain behavior of skin: Secondary | ICD-10-CM | POA: Diagnosis not present

## 2015-03-11 ENCOUNTER — Other Ambulatory Visit: Payer: Self-pay | Admitting: Family Medicine

## 2015-04-14 ENCOUNTER — Ambulatory Visit (INDEPENDENT_AMBULATORY_CARE_PROVIDER_SITE_OTHER): Payer: Medicare Other | Admitting: Family Medicine

## 2015-04-14 ENCOUNTER — Encounter: Payer: Self-pay | Admitting: Family Medicine

## 2015-04-14 VITALS — BP 120/70 | HR 80 | Ht 65.0 in | Wt 234.0 lb

## 2015-04-14 DIAGNOSIS — F4321 Adjustment disorder with depressed mood: Secondary | ICD-10-CM | POA: Diagnosis not present

## 2015-04-14 DIAGNOSIS — I10 Essential (primary) hypertension: Secondary | ICD-10-CM | POA: Diagnosis not present

## 2015-04-14 DIAGNOSIS — F329 Major depressive disorder, single episode, unspecified: Secondary | ICD-10-CM

## 2015-04-14 DIAGNOSIS — E784 Other hyperlipidemia: Secondary | ICD-10-CM | POA: Diagnosis not present

## 2015-04-14 DIAGNOSIS — F339 Major depressive disorder, recurrent, unspecified: Secondary | ICD-10-CM | POA: Diagnosis not present

## 2015-04-14 DIAGNOSIS — E7849 Other hyperlipidemia: Secondary | ICD-10-CM

## 2015-04-14 DIAGNOSIS — Z780 Asymptomatic menopausal state: Secondary | ICD-10-CM | POA: Diagnosis not present

## 2015-04-14 DIAGNOSIS — G4700 Insomnia, unspecified: Secondary | ICD-10-CM

## 2015-04-14 DIAGNOSIS — F32A Depression, unspecified: Secondary | ICD-10-CM

## 2015-04-14 DIAGNOSIS — K219 Gastro-esophageal reflux disease without esophagitis: Secondary | ICD-10-CM

## 2015-04-14 MED ORDER — CYCLOBENZAPRINE HCL 10 MG PO TABS: 10.0000 mg | ORAL_TABLET | ORAL | Status: DC | PRN

## 2015-04-14 MED ORDER — METOPROLOL SUCCINATE ER 100 MG PO TB24: 100.0000 mg | ORAL_TABLET | Freq: Every day | ORAL | Status: DC

## 2015-04-14 MED ORDER — CELECOXIB 200 MG PO CAPS: 200.0000 mg | ORAL_CAPSULE | Freq: Every day | ORAL | Status: DC

## 2015-04-14 MED ORDER — LANSOPRAZOLE 30 MG PO CPDR: 30.0000 mg | DELAYED_RELEASE_CAPSULE | Freq: Every day | ORAL | Status: DC

## 2015-04-14 MED ORDER — HYDROCHLOROTHIAZIDE 12.5 MG PO CAPS: 12.5000 mg | ORAL_CAPSULE | Freq: Every day | ORAL | Status: DC

## 2015-04-14 MED ORDER — SERTRALINE HCL 100 MG PO TABS: 100.0000 mg | ORAL_TABLET | Freq: Every day | ORAL | Status: DC

## 2015-04-14 MED ORDER — ZOLPIDEM TARTRATE 10 MG PO TABS: 10.0000 mg | ORAL_TABLET | Freq: Every evening | ORAL | Status: DC | PRN

## 2015-04-14 NOTE — Progress Notes (Signed)
Name: Melissa Chase   MRN: [id number]    DOB: 02/19/1940   Date:04/14/2015       Progress Note  Subjective  Chief Complaint  Chief Complaint  Patient presents with  . Hypertension  . Depression  . Insomnia  . Spasms    uses Flexeril as needed  . Arthritis    Hypertension This is a chronic problem. The current episode started more than 1 year ago. The problem is unchanged. The problem is controlled. Pertinent negatives include no anxiety, blurred vision, chest pain, headaches, malaise/fatigue, neck pain, orthopnea, palpitations, peripheral edema, PND, shortness of breath or sweats. There are no associated agents to hypertension. There are no known risk factors for coronary artery disease. Past treatments include beta blockers and diuretics. The current treatment provides mild improvement. There are no compliance problems.  There is no history of angina, kidney disease, CAD/MI, CVA, heart failure, left ventricular hypertrophy, PVD, renovascular disease or retinopathy. There is no history of chronic renal disease or a hypertension causing med.  Depression        This is a recurrent problem.  The current episode started more than 1 year ago.   The onset quality is gradual.   Associated symptoms include fatigue, insomnia, restlessness and decreased interest.  Associated symptoms include no decreased concentration, no helplessness, no hopelessness, not irritable, no appetite change, no myalgias, no headaches and no suicidal ideas.  Past treatments include SSRIs - Selective serotonin reuptake inhibitors.  Compliance with treatment is good.   Pertinent negatives include no anxiety. Insomnia Primary symptoms: difficulty falling asleep, no malaise/fatigue.  The onset quality is gradual. Past treatments include medication. The treatment provided mild relief. PMH includes: depression.  Arthritis Presents for follow-up visit. The symptoms have been stable. Associated symptoms include fatigue. Pertinent  negatives include no diarrhea, dysuria, fever, rash or weight loss.    No problem-specific assessment & plan notes found for this encounter.   Past Medical History  Diagnosis Date  . Depression   . Insomnia   . Hypertension   . GERD (gastroesophageal reflux disease)   . Insomnia     Past Surgical History  Procedure Laterality Date  . Vaginal hysterectomy    . Laparoscopic cholecystectomy    . Tonsillectomy      Family History  Problem Relation Age of Onset  . Diabetes Sister     Social History   Social History  . Marital Status: Married    Spouse Name: N/A  . Number of Children: N/A  . Years of Education: N/A   Occupational History  . Not on file.   Social History Main Topics  . Smoking status: Never Smoker   . Smokeless tobacco: Not on file  . Alcohol Use: 0.0 oz/week    0 Standard drinks or equivalent per week  . Drug Use: No  . Sexual Activity: No   Other Topics Concern  . Not on file   Social History Narrative    No Known Allergies   Review of Systems  Constitutional: Positive for fatigue. Negative for fever, chills, weight loss, malaise/fatigue and appetite change.  HENT: Negative for ear discharge, ear pain and sore throat.   Eyes: Negative for blurred vision.  Respiratory: Negative for cough, sputum production, shortness of breath and wheezing.   Cardiovascular: Negative for chest pain, palpitations, orthopnea, leg swelling and PND.  Gastrointestinal: Negative for heartburn, nausea, abdominal pain, diarrhea, constipation, blood in stool and melena.  Genitourinary: Negative for dysuria, urgency, frequency and hematuria.  Musculoskeletal: Positive for arthritis. Negative for myalgias, back pain, joint pain and neck pain.  Skin: Negative for rash.  Neurological: Negative for dizziness, tingling, sensory change, focal weakness and headaches.  Endo/Heme/Allergies: Negative for environmental allergies and polydipsia. Does not bruise/bleed easily.   Psychiatric/Behavioral: Positive for depression. Negative for suicidal ideas and decreased concentration. The patient has insomnia. The patient is not nervous/anxious.      Objective  Filed Vitals:   04/14/15 0915  BP: 120/70  Pulse: 80  Height: 5\' 5"  (1.651 m)  Weight: 234 lb (106.142 kg)    Physical Exam  Constitutional: She is well-developed, well-nourished, and in no distress. She is not irritable. No distress.  HENT:  Head: Normocephalic and atraumatic.  Right Ear: External ear normal.  Left Ear: External ear normal.  Nose: Nose normal.  Mouth/Throat: Oropharynx is clear and moist.  Eyes: Conjunctivae and EOM are normal. Pupils are equal, round, and reactive to light. Right eye exhibits no discharge. Left eye exhibits no discharge.  Neck: Normal range of motion. Neck supple. No JVD present. No thyromegaly present.  Cardiovascular: Normal rate, regular rhythm, normal heart sounds and intact distal pulses.  Exam reveals no gallop and no friction rub.   No murmur heard. Pulmonary/Chest: Effort normal and breath sounds normal.  Abdominal: Soft. Bowel sounds are normal. She exhibits no mass. There is no tenderness. There is no guarding.  Musculoskeletal: Normal range of motion. She exhibits no edema.  Lymphadenopathy:    She has no cervical adenopathy.  Neurological: She is alert. She has normal reflexes.  Skin: Skin is warm and dry. She is not diaphoretic.  Psychiatric: Mood and affect normal.  Nursing note and vitals reviewed.     Assessment & Plan  Problem List Items Addressed This Visit      Cardiovascular and Mediastinum   Essential (primary) hypertension - Primary   Relevant Medications   hydrochlorothiazide (MICROZIDE) 12.5 MG capsule   metoprolol succinate (TOPROL-XL) 100 MG 24 hr tablet   Other Relevant Orders   Renal Function Panel     Digestive   Gastro-esophageal reflux disease without esophagitis   Relevant Medications   lansoprazole (PREVACID) 30  MG capsule     Other   Familial multiple lipoprotein-type hyperlipidemia   Relevant Medications   hydrochlorothiazide (MICROZIDE) 12.5 MG capsule   metoprolol succinate (TOPROL-XL) 100 MG 24 hr tablet   Aggrieved   Recurrent major depressive episodes (HCC)   Relevant Medications   sertraline (ZOLOFT) 100 MG tablet   Cannot sleep   Menopause    Other Visit Diagnoses    Depression        Relevant Medications    sertraline (ZOLOFT) 100 MG tablet    Essential hypertension        Relevant Medications    hydrochlorothiazide (MICROZIDE) 12.5 MG capsule    metoprolol succinate (TOPROL-XL) 100 MG 24 hr tablet         Dr. Deanna Jones [facility]  04/14/2015

## 2015-04-15 LAB — RENAL FUNCTION PANEL
Albumin: 4.2 g/dL (ref 3.5–4.8)
BUN/Creatinine Ratio: 17 (ref 11–26)
BUN: 12 mg/dL (ref 8–27)
CO2: 26 mmol/L (ref 18–29)
Calcium: 8.7 mg/dL (ref 8.7–10.3)
Chloride: 105 mmol/L (ref 96–106)
Creatinine, Ser: 0.71 mg/dL (ref 0.57–1.00)
GFR calc Af Amer: 98 mL/min/{1.73_m2} (ref >59)
GFR calc non Af Amer: 85 mL/min/{1.73_m2} (ref >59)
Glucose: 83 mg/dL (ref 65–99)
Phosphorus: 3.4 mg/dL (ref 2.5–4.5)
Potassium: 4.6 mmol/L (ref 3.5–5.2)
Sodium: 146 mmol/L — ABNORMAL HIGH (ref 134–144)

## 2015-05-12 DIAGNOSIS — C44311 Basal cell carcinoma of skin of nose: Secondary | ICD-10-CM | POA: Diagnosis not present

## 2015-06-14 ENCOUNTER — Other Ambulatory Visit: Payer: Self-pay | Admitting: Family Medicine

## 2015-06-28 ENCOUNTER — Other Ambulatory Visit: Payer: Self-pay

## 2015-06-28 MED ORDER — FLUCONAZOLE 150 MG PO TABS: 150.0000 mg | ORAL_TABLET | Freq: Once | ORAL | Status: DC

## 2015-07-14 ENCOUNTER — Other Ambulatory Visit: Payer: Self-pay | Admitting: Family Medicine

## 2015-09-05 ENCOUNTER — Ambulatory Visit (INDEPENDENT_AMBULATORY_CARE_PROVIDER_SITE_OTHER): Payer: Medicare Other | Admitting: Family Medicine

## 2015-09-05 ENCOUNTER — Encounter: Payer: Self-pay | Admitting: Family Medicine

## 2015-09-05 VITALS — BP 120/70 | HR 64 | Ht 65.0 in | Wt 234.0 lb

## 2015-09-05 DIAGNOSIS — S39012A Strain of muscle, fascia and tendon of lower back, initial encounter: Secondary | ICD-10-CM

## 2015-09-05 DIAGNOSIS — M461 Sacroiliitis, not elsewhere classified: Secondary | ICD-10-CM | POA: Diagnosis not present

## 2015-09-05 DIAGNOSIS — M519 Unspecified thoracic, thoracolumbar and lumbosacral intervertebral disc disorder: Secondary | ICD-10-CM | POA: Diagnosis not present

## 2015-09-05 MED ORDER — TRAMADOL HCL 50 MG PO TABS: 50.0000 mg | ORAL_TABLET | Freq: Three times a day (TID) | ORAL | 0 refills | Status: DC | PRN

## 2015-09-05 NOTE — Progress Notes (Signed)
Name: Melissa Chase   MRN: [id number]    DOB: 1940/06/16   Date:09/05/2015       Progress Note  Subjective  Chief Complaint  Chief Complaint  Patient presents with  . Back Pain    celebrex not helping- comes and goes, but hurts worse when she lays down. Stiff in the ams    Back Pain  This is a chronic problem. The current episode started 1 to 4 weeks ago. The problem occurs constantly. The problem has been waxing and waning since onset. The pain is present in the lumbar spine. The quality of the pain is described as aching. The pain does not radiate. The pain is at a severity of 8/10. The pain is moderate. The symptoms are aggravated by position and lying down. Stiffness is present in the morning. Pertinent negatives include no abdominal pain, bladder incontinence, bowel incontinence, chest pain, dysuria, fever, headaches, leg pain, numbness, paresis, paresthesias, pelvic pain, perianal numbness, tingling, weakness or weight loss. Risk factors include history of cancer. She has tried nothing for the symptoms. The treatment provided no relief.    No problem-specific Assessment & Plan notes found for this encounter.   Past Medical History:  Diagnosis Date  . Depression   . GERD (gastroesophageal reflux disease)   . Hypertension   . Insomnia   . Insomnia     Past Surgical History:  Procedure Laterality Date  . LAPAROSCOPIC CHOLECYSTECTOMY    . TONSILLECTOMY    . VAGINAL HYSTERECTOMY      Family History  Problem Relation Age of Onset  . Diabetes Sister     Social History   Social History  . Marital status: Married    Spouse name: N/A  . Number of children: N/A  . Years of education: N/A   Occupational History  . Not on file.   Social History Main Topics  . Smoking status: Never Smoker  . Smokeless tobacco: Not on file  . Alcohol use 0.0 oz/week  . Drug use: No  . Sexual activity: No   Other Topics Concern  . Not on file   Social History Narrative  . No  narrative on file    No Known Allergies   Review of Systems  Constitutional: Negative for chills, fever, malaise/fatigue and weight loss.  HENT: Negative for ear discharge, ear pain and sore throat.   Eyes: Negative for blurred vision.  Respiratory: Negative for cough, sputum production, shortness of breath and wheezing.   Cardiovascular: Negative for chest pain, palpitations and leg swelling.  Gastrointestinal: Negative for abdominal pain, blood in stool, bowel incontinence, constipation, diarrhea, heartburn, melena and nausea.  Genitourinary: Negative for bladder incontinence, dysuria, frequency, hematuria, pelvic pain and urgency.  Musculoskeletal: Positive for back pain. Negative for joint pain, myalgias and neck pain.  Skin: Negative for rash.  Neurological: Negative for dizziness, tingling, sensory change, focal weakness, weakness, numbness, headaches and paresthesias.  Endo/Heme/Allergies: Negative for environmental allergies and polydipsia. Does not bruise/bleed easily.  Psychiatric/Behavioral: Negative for depression and suicidal ideas. The patient is not nervous/anxious and does not have insomnia.      Objective  Vitals:   09/05/15 1030  BP: 120/70  Pulse: 64  Weight: 234 lb (106.1 kg)  Height: 5\' 5"  (1.651 m)    Physical Exam  Constitutional: She is well-developed, well-nourished, and in no distress. No distress.  HENT:  Head: Normocephalic and atraumatic.  Right Ear: External ear normal.  Left Ear: External ear normal.  Nose: Nose normal.  Mouth/Throat: Oropharynx is clear and moist.  Eyes: Conjunctivae and EOM are normal. Pupils are equal, round, and reactive to light. Right eye exhibits no discharge. Left eye exhibits no discharge.  Neck: Normal range of motion. Neck supple. No JVD present. No thyromegaly present.  Cardiovascular: Normal rate, regular rhythm, normal heart sounds and intact distal pulses.  Exam reveals no gallop and no friction rub.   No murmur  heard. Pulmonary/Chest: Effort normal and breath sounds normal.  Abdominal: Soft. Bowel sounds are normal. She exhibits no mass. There is no tenderness. There is no guarding.  Musculoskeletal: Normal range of motion. She exhibits no edema.       Lumbar back: She exhibits spasm.  Tenderness Sacroiliac joints bilateral  Lymphadenopathy:    She has no cervical adenopathy.  Neurological: She is alert. She has normal motor skills, normal sensation, normal strength and normal reflexes. She has a normal Straight Leg Raise Test.  Skin: Skin is warm and dry. She is not diaphoretic.  Psychiatric: Mood and affect normal.      Assessment & Plan  Problem List Items Addressed This Visit    None    Visit Diagnoses    Lumbosacral strain, initial encounter    -  Primary   Relevant Medications   traMADol (ULTRAM) 50 MG tablet   Other Relevant Orders   DG Lumbar Spine Complete   Intervertebral disc disorder       Relevant Medications   traMADol (ULTRAM) 50 MG tablet   Other Relevant Orders   DG Lumbar Spine Complete   Sacroiliitis (HCC)       Relevant Medications   traMADol (ULTRAM) 50 MG tablet   Other Relevant Orders   DG Lumbar Spine Complete        Dr. Deanna Jones [facility]  09/05/15

## 2015-10-04 DIAGNOSIS — C44311 Basal cell carcinoma of skin of nose: Secondary | ICD-10-CM | POA: Diagnosis not present

## 2015-10-16 ENCOUNTER — Other Ambulatory Visit: Payer: Self-pay

## 2015-10-16 DIAGNOSIS — B379 Candidiasis, unspecified: Secondary | ICD-10-CM

## 2015-10-16 MED ORDER — FLUCONAZOLE 150 MG PO TABS: 150.0000 mg | ORAL_TABLET | Freq: Once | ORAL | 0 refills | Status: AC

## 2015-10-17 ENCOUNTER — Ambulatory Visit
Admission: RE | Admit: 2015-10-17 | Discharge: 2015-10-17 | Disposition: A | Discharge status: Home / Self Care | Payer: Medicare Other | Source: Ambulatory Visit | Attending: Family Medicine | Admitting: Family Medicine

## 2015-10-17 DIAGNOSIS — M5136 Other intervertebral disc degeneration, lumbar region: Secondary | ICD-10-CM | POA: Insufficient documentation

## 2015-10-17 DIAGNOSIS — S39012A Strain of muscle, fascia and tendon of lower back, initial encounter: Secondary | ICD-10-CM

## 2015-10-17 DIAGNOSIS — M545 Low back pain: Secondary | ICD-10-CM | POA: Diagnosis present

## 2015-10-17 DIAGNOSIS — M5135 Other intervertebral disc degeneration, thoracolumbar region: Secondary | ICD-10-CM | POA: Diagnosis not present

## 2015-10-17 DIAGNOSIS — M461 Sacroiliitis, not elsewhere classified: Secondary | ICD-10-CM

## 2015-10-17 DIAGNOSIS — M519 Unspecified thoracic, thoracolumbar and lumbosacral intervertebral disc disorder: Secondary | ICD-10-CM

## 2015-10-27 ENCOUNTER — Other Ambulatory Visit: Payer: Self-pay | Admitting: Family Medicine

## 2015-11-27 ENCOUNTER — Ambulatory Visit (INDEPENDENT_AMBULATORY_CARE_PROVIDER_SITE_OTHER): Payer: Medicare Other | Admitting: Family Medicine

## 2015-11-27 ENCOUNTER — Encounter: Payer: Self-pay | Admitting: Family Medicine

## 2015-11-27 VITALS — BP 120/80 | HR 72 | Ht 65.0 in | Wt 231.0 lb

## 2015-11-27 DIAGNOSIS — J40 Bronchitis, not specified as acute or chronic: Secondary | ICD-10-CM | POA: Diagnosis not present

## 2015-11-27 MED ORDER — BENZONATATE 100 MG PO CAPS: 100.0000 mg | ORAL_CAPSULE | Freq: Two times a day (BID) | ORAL | 0 refills | Status: DC | PRN

## 2015-11-27 MED ORDER — AMOXICILLIN-POT CLAVULANATE 875-125 MG PO TABS: 1.0000 | ORAL_TABLET | Freq: Two times a day (BID) | ORAL | 0 refills | Status: DC

## 2015-11-27 NOTE — Progress Notes (Signed)
Name: Melissa Chase   MRN: [id number]    DOB: [birth year]   Date:11/27/2015       Progress Note  Subjective  Chief Complaint  Chief Complaint  Patient presents with  . Hoarse    x 2 days- cough and scratchy throat- green production    Sore Throat   This is a new problem. The current episode started yesterday. The problem has been waxing and waning. Neither side of throat is experiencing more pain than the other. There has been no fever. The fever has been present for 1 to 2 days. The pain is mild. Associated symptoms include a hoarse voice. Pertinent negatives include no abdominal pain, congestion, coughing, diarrhea, drooling, ear discharge, ear pain, headaches, plugged ear sensation, neck pain, shortness of breath, stridor, swollen glands, trouble swallowing or vomiting. She has tried nothing for the symptoms. The treatment provided mild relief.  Cough  This is a new problem. The current episode started in the past 7 days. The problem has been gradually improving. The cough is productive of purulent sputum. Pertinent negatives include no chest pain, chills, ear congestion, ear pain, fever, headaches, heartburn, hemoptysis, myalgias, nasal congestion, postnasal drip, rash, rhinorrhea, sore throat, shortness of breath, sweats, weight loss or wheezing. The symptoms are aggravated by cold air. She has tried OTC cough suppressant for the symptoms. The treatment provided moderate relief. There is no history of environmental allergies.    No problem-specific Assessment & Plan notes found for this encounter.   Past Medical History:  Diagnosis Date  . Depression   . GERD (gastroesophageal reflux disease)   . Hypertension   . Insomnia   . Insomnia     Past Surgical History:  Procedure Laterality Date  . LAPAROSCOPIC CHOLECYSTECTOMY    . TONSILLECTOMY    . VAGINAL HYSTERECTOMY      Family History  Problem Relation Age of Onset  . Diabetes Sister     Social History   Social  History  . Marital status: Married    Spouse name: N/A  . Number of children: N/A  . Years of education: N/A   Occupational History  . Not on file.   Social History Main Topics  . Smoking status: Never Smoker  . Smokeless tobacco: Not on file  . Alcohol use 0.0 oz/week  . Drug use: No  . Sexual activity: No   Other Topics Concern  . Not on file   Social History Narrative  . No narrative on file    No Known Allergies   Review of Systems  Constitutional: Negative for chills, fever, malaise/fatigue and weight loss.  HENT: Positive for hoarse voice. Negative for congestion, drooling, ear discharge, ear pain, postnasal drip, rhinorrhea, sore throat and trouble swallowing.   Eyes: Negative for blurred vision.  Respiratory: Negative for cough, hemoptysis, sputum production, shortness of breath, wheezing and stridor.   Cardiovascular: Negative for chest pain, palpitations and leg swelling.  Gastrointestinal: Negative for abdominal pain, blood in stool, constipation, diarrhea, heartburn, melena, nausea and vomiting.  Genitourinary: Negative for dysuria, frequency, hematuria and urgency.  Musculoskeletal: Negative for back pain, joint pain, myalgias and neck pain.  Skin: Negative for rash.  Neurological: Negative for dizziness, tingling, sensory change, focal weakness and headaches.  Endo/Heme/Allergies: Negative for environmental allergies and polydipsia. Does not bruise/bleed easily.  Psychiatric/Behavioral: Negative for depression and suicidal ideas. The patient is not nervous/anxious and does not have insomnia.      Objective  Vitals:   11/27/15 1338  BP: 120/80  Pulse: 72  Weight: 231 lb (104.8 kg)  Height: 5\' 5"  (1.651 m)    Physical Exam  Constitutional: She is well-developed, well-nourished, and in no distress. No distress.  HENT:  Head: Normocephalic and atraumatic.  Right Ear: External ear normal.  Left Ear: External ear normal.  Nose: Nose normal.   Mouth/Throat: Oropharynx is clear and moist.  Eyes: Conjunctivae and EOM are normal. Pupils are equal, round, and reactive to light. Right eye exhibits no discharge. Left eye exhibits no discharge.  Neck: Normal range of motion. Neck supple. No JVD present. No thyromegaly present.  Cardiovascular: Normal rate, regular rhythm, normal heart sounds and intact distal pulses.  Exam reveals no gallop and no friction rub.   No murmur heard. Pulmonary/Chest: Effort normal and breath sounds normal. No respiratory distress. She has no wheezes. She has no rales.  Abdominal: Soft. Bowel sounds are normal. She exhibits no mass. There is no tenderness. There is no guarding.  Musculoskeletal: Normal range of motion. She exhibits no edema.  Lymphadenopathy:    She has no cervical adenopathy.  Neurological: She is alert. She has normal reflexes.  Skin: Skin is warm and dry. She is not diaphoretic.  Psychiatric: Mood and affect normal.  Nursing note and vitals reviewed.     Assessment & Plan  Problem List Items Addressed This Visit    None    Visit Diagnoses    Bronchitis    -  Primary   Relevant Medications   amoxicillin-clavulanate (AUGMENTIN) 875-125 MG tablet   benzonatate (TESSALON) 100 MG capsule        Dr. [facility]  11/27/15

## 2015-12-01 ENCOUNTER — Other Ambulatory Visit: Payer: Self-pay | Admitting: Family Medicine

## 2015-12-18 ENCOUNTER — Other Ambulatory Visit: Payer: Self-pay

## 2015-12-19 ENCOUNTER — Other Ambulatory Visit: Payer: Self-pay | Admitting: Family Medicine

## 2015-12-19 DIAGNOSIS — F329 Major depressive disorder, single episode, unspecified: Secondary | ICD-10-CM

## 2015-12-19 DIAGNOSIS — F339 Major depressive disorder, recurrent, unspecified: Secondary | ICD-10-CM

## 2015-12-19 DIAGNOSIS — I10 Essential (primary) hypertension: Secondary | ICD-10-CM

## 2015-12-19 DIAGNOSIS — F32A Depression, unspecified: Secondary | ICD-10-CM

## 2016-02-13 ENCOUNTER — Other Ambulatory Visit: Payer: Self-pay | Admitting: Family Medicine

## 2016-02-13 DIAGNOSIS — I10 Essential (primary) hypertension: Secondary | ICD-10-CM

## 2016-02-13 DIAGNOSIS — F339 Major depressive disorder, recurrent, unspecified: Secondary | ICD-10-CM

## 2016-02-13 DIAGNOSIS — F329 Major depressive disorder, single episode, unspecified: Secondary | ICD-10-CM

## 2016-02-13 DIAGNOSIS — F32A Depression, unspecified: Secondary | ICD-10-CM

## 2016-02-15 ENCOUNTER — Ambulatory Visit
Admission: RE | Admit: 2016-02-15 | Discharge: 2016-02-15 | Disposition: A | Discharge status: Home / Self Care | Payer: Medicare Other | Source: Ambulatory Visit | Attending: Family Medicine | Admitting: Family Medicine

## 2016-02-15 ENCOUNTER — Ambulatory Visit (INDEPENDENT_AMBULATORY_CARE_PROVIDER_SITE_OTHER): Payer: Medicare Other | Admitting: Family Medicine

## 2016-02-15 VITALS — BP 110/80 | HR 84 | Ht 65.0 in | Wt 238.0 lb

## 2016-02-15 DIAGNOSIS — R0609 Other forms of dyspnea: Secondary | ICD-10-CM

## 2016-02-15 DIAGNOSIS — R1310 Dysphagia, unspecified: Secondary | ICD-10-CM

## 2016-02-15 DIAGNOSIS — R918 Other nonspecific abnormal finding of lung field: Secondary | ICD-10-CM | POA: Diagnosis not present

## 2016-02-15 DIAGNOSIS — I4891 Unspecified atrial fibrillation: Secondary | ICD-10-CM

## 2016-02-15 DIAGNOSIS — M5134 Other intervertebral disc degeneration, thoracic region: Secondary | ICD-10-CM | POA: Diagnosis not present

## 2016-02-15 DIAGNOSIS — R0600 Dyspnea, unspecified: Secondary | ICD-10-CM

## 2016-02-15 DIAGNOSIS — R1319 Other dysphagia: Secondary | ICD-10-CM

## 2016-02-15 DIAGNOSIS — R9431 Abnormal electrocardiogram [ECG] [EKG]: Secondary | ICD-10-CM | POA: Diagnosis not present

## 2016-02-15 DIAGNOSIS — R1013 Epigastric pain: Secondary | ICD-10-CM

## 2016-02-15 NOTE — Patient Instructions (Signed)
Atrial Fibrillation Atrial fibrillation is a type of irregular or rapid heartbeat (arrhythmia). In atrial fibrillation, the heart quivers continuously in a chaotic pattern. This occurs when parts of the heart receive disorganized signals that make the heart unable to pump blood normally. This can increase the risk for stroke, heart failure, and other heart-related conditions. There are different types of atrial fibrillation, including:  Paroxysmal atrial fibrillation. This type starts suddenly, and it usually stops on its own shortly after it starts.  Persistent atrial fibrillation. This type often lasts longer than a week. It may stop on its own or with treatment.  Long-lasting persistent atrial fibrillation. This type lasts longer than 12 months.  Permanent atrial fibrillation. This type does not go away.  Talk with your health care provider to learn about the type of atrial fibrillation that you have. What are the causes? This condition is caused by some heart-related conditions or procedures, including:  A heart attack.  Coronary artery disease.  Heart failure.  Heart valve conditions.  High blood pressure.  Inflammation of the sac that surrounds the heart (pericarditis).  Heart surgery.  Certain heart rhythm disorders, such as Wolf-Parkinson-White syndrome.  Other causes include:  Pneumonia.  Obstructive sleep apnea.  Blockage of an artery in the lungs (pulmonary embolism, or PE).  Lung cancer.  Chronic lung disease.  Thyroid problems, especially if the thyroid is overactive (hyperthyroidism).  Caffeine.  Excessive alcohol use or illegal drug use.  Use of some medicines, including certain decongestants and diet pills.  Sometimes, the cause cannot be found. What increases the risk? This condition is more likely to develop in:  People who are older in age.  People who smoke.  People who have diabetes mellitus.  People who are overweight  (obese).  Athletes who exercise vigorously.  What are the signs or symptoms? Symptoms of this condition include:  A feeling that your heart is beating rapidly or irregularly.  A feeling of discomfort or pain in your chest.  Shortness of breath.  Sudden light-headedness or weakness.  Getting tired easily during exercise.  In some cases, there are no symptoms. How is this diagnosed? Your health care provider may be able to detect atrial fibrillation when taking your pulse. If detected, this condition may be diagnosed with:  An electrocardiogram (ECG).  A Holter monitor test that records your heartbeat patterns over a 24-hour period.  Transthoracic echocardiogram (TTE) to evaluate how blood flows through your heart.  Transesophageal echocardiogram (TEE) to view more detailed images of your heart.  A stress test.  Imaging tests, such as a CT scan or chest X-ray.  Blood tests.  How is this treated? The main goals of treatment are to prevent blood clots from forming and to keep your heart beating at a normal rate and rhythm. The type of treatment that you receive depends on many factors, such as your underlying medical conditions and how you feel when you are experiencing atrial fibrillation. This condition may be treated with:  Medicine to slow down the heart rate, bring the heart's rhythm back to normal, or prevent clots from forming.  Electrical cardioversion. This is a procedure that resets your heart's rhythm by delivering a controlled, low-energy shock to the heart through your skin.  Different types of ablation, such as catheter ablation, catheter ablation with pacemaker, or surgical ablation. These procedures destroy the heart tissues that send abnormal signals. When the pacemaker is used, it is placed under your skin to help your heart beat in   a regular rhythm.  Follow these instructions at home:  Take over-the counter and prescription medicines only as told by your  health care provider.  If your health care provider prescribed a blood-thinning medicine (anticoagulant), take it exactly as told. Taking too much blood-thinning medicine can cause bleeding. If you do not take enough blood-thinning medicine, you will not have the protection that you need against stroke and other problems.  Do not use tobacco products, including cigarettes, chewing tobacco, and e-cigarettes. If you need help quitting, ask your health care provider.  If you have obstructive sleep apnea, manage your condition as told by your health care provider.  Do not drink alcohol.  Do not drink beverages that contain caffeine, such as coffee, soda, and tea.  Maintain a healthy weight. Do not use diet pills unless your health care provider approves. Diet pills may make heart problems worse.  Follow diet instructions as told by your health care provider.  Exercise regularly as told by your health care provider.  Keep all follow-up visits as told by your health care provider. This is important. How is this prevented?  Avoid drinking beverages that contain caffeine or alcohol.  Avoid certain medicines, especially medicines that are used for breathing problems.  Avoid certain herbs and herbal medicines, such as those that contain ephedra or ginseng.  Do not use illegal drugs, such as cocaine and amphetamines.  Do not smoke.  Manage your high blood pressure. Contact a health care provider if:  You notice a change in the rate, rhythm, or strength of your heartbeat.  You are taking an anticoagulant and you notice increased bruising.  You tire more easily when you exercise or exert yourself. Get help right away if:  You have chest pain, abdominal pain, sweating, or weakness.  You feel nauseous.  You notice blood in your vomit, bowel movement, or urine.  You have shortness of breath.  You suddenly have swollen feet and ankles.  You feel dizzy.  You have sudden weakness or  numbness of the face, arm, or leg, especially on one side of the body.  You have trouble speaking, trouble understanding, or both (aphasia).  Your face or your eyelid droops on one side. These symptoms may represent a serious problem that is an emergency. Do not wait to see if the symptoms will go away. Get medical help right away. Call your local emergency services (911 in the [place] Do not drive yourself to the hospital. This information is not intended to replace advice given to you by your health care provider. Make sure you discuss any questions you have with your health care provider. Document Released: 01/07/2005 Document Revised: 05/17/2015 Document Reviewed: 05/04/2014 Elsevier Interactive Patient Education  2017 [employer]  

## 2016-02-15 NOTE — Progress Notes (Signed)
Name: Melissa Chase   MRN: [id number]    DOB: December 02, 1940   Date:02/15/2016       Progress Note  Subjective  Chief Complaint  Chief Complaint  Patient presents with  . Abdominal Pain    "feels like something is in the center of abdomen swelling up"- occurs after eating and when tries to lay down at night. Is currently taking Prevacid, but insurance will not pay anymore. Has a Hx of stretching her esophagus due to reflux coming up. Has times after swallowing food- "has fluid or phlegm that comes up with a streak of blood in it"    Abdominal Pain  This is a recurrent (dysphagia) problem. The current episode started more than 1 year ago. The onset quality is gradual. The problem occurs intermittently. The problem has been waxing and waning. The pain is located in the epigastric region. The pain is moderate. The quality of the pain is colicky. Pertinent negatives include no constipation, diarrhea, dysuria, fever, frequency, headaches, hematuria, melena, myalgias, nausea, vomiting or weight loss. The pain is aggravated by belching (blood tinged). She has tried nothing for the symptoms. The treatment provided moderate relief. Prior diagnostic workup includes upper endoscopy. esophageal stricture  Shortness of Breath  This is a new problem. The current episode started 1 to 4 weeks ago. The problem occurs intermittently. The problem has been waxing and waning. Associated symptoms include abdominal pain, hemoptysis and wheezing. Pertinent negatives include no chest pain, claudication, coryza, ear pain, fever, headaches, leg pain, leg swelling, neck pain, orthopnea, PND, rash, rhinorrhea, sore throat, sputum production, swollen glands, syncope or vomiting. Nothing aggravates the symptoms. She has tried nothing for the symptoms. The treatment provided moderate relief. There is no history of allergies, aspirin allergies, asthma, bronchiolitis, CAD, chronic lung disease, COPD, DVT, a heart failure, PE or  pneumonia. (Esophageal stricture)    No problem-specific Assessment & Plan notes found for this encounter.   Past Medical History:  Diagnosis Date  . Depression   . GERD (gastroesophageal reflux disease)   . Hypertension   . Insomnia   . Insomnia     Past Surgical History:  Procedure Laterality Date  . LAPAROSCOPIC CHOLECYSTECTOMY    . TONSILLECTOMY    . VAGINAL HYSTERECTOMY      Family History  Problem Relation Age of Onset  . Diabetes Sister     Social History   Social History  . Marital status: Married    Spouse name: N/A  . Number of children: N/A  . Years of education: N/A   Occupational History  . Not on file.   Social History Main Topics  . Smoking status: Never Smoker  . Smokeless tobacco: Not on file  . Alcohol use 0.0 oz/week  . Drug use: No  . Sexual activity: No   Other Topics Concern  . Not on file   Social History Narrative  . No narrative on file    No Known Allergies   Review of Systems  Constitutional: Negative for chills, fever, malaise/fatigue and weight loss.  HENT: Negative for congestion, ear discharge, ear pain, nosebleeds, rhinorrhea and sore throat.   Eyes: Negative for blurred vision.  Respiratory: Positive for hemoptysis, shortness of breath and wheezing. Negative for cough and sputum production.   Cardiovascular: Negative for chest pain, palpitations, orthopnea, claudication, leg swelling, syncope and PND.  Gastrointestinal: Positive for abdominal pain. Negative for blood in stool, constipation, diarrhea, heartburn, melena, nausea and vomiting.  Genitourinary: Negative for dysuria, frequency, hematuria  and urgency.  Musculoskeletal: Negative for back pain, joint pain, myalgias and neck pain.  Skin: Negative for rash.  Neurological: Negative for dizziness, tingling, sensory change, focal weakness and headaches.  Endo/Heme/Allergies: Negative for environmental allergies and polydipsia. Does not bruise/bleed easily.   Psychiatric/Behavioral: Negative for depression and suicidal ideas. The patient is not nervous/anxious and does not have insomnia.      Objective  Vitals:   02/15/16 1412  BP: 110/80  Pulse: 84  Weight: 238 lb (108 kg)  Height: 5\' 5"  (1.651 m)    Physical Exam  Constitutional: She is well-developed, well-nourished, and in no distress. No distress.  HENT:  Head: Normocephalic and atraumatic.  Right Ear: External ear normal.  Left Ear: External ear normal.  Nose: Nose normal.  Mouth/Throat: Oropharynx is clear and moist.  Eyes: Conjunctivae and EOM are normal. Pupils are equal, round, and reactive to light. Right eye exhibits no discharge. Left eye exhibits no discharge.  Neck: Normal range of motion. Neck supple. No JVD present. No thyromegaly present.  Cardiovascular: Normal rate, regular rhythm, normal heart sounds and intact distal pulses.  Exam reveals no gallop and no friction rub.   No murmur heard. Pulmonary/Chest: Effort normal and breath sounds normal.  Abdominal: Soft. Bowel sounds are normal. She exhibits no mass. There is no tenderness. There is no guarding.  Musculoskeletal: Normal range of motion. She exhibits no edema.  Lymphadenopathy:    She has no cervical adenopathy.  Neurological: She is alert. She has normal reflexes.  Skin: Skin is warm and dry. She is not diaphoretic.  Psychiatric: Mood and affect normal.      Assessment & Plan  Problem List Items Addressed This Visit    None    Visit Diagnoses    Epigastric pain    -  Primary   Relevant Orders   EKG 12-Lead (Completed)   Esophageal dysphagia       Relevant Orders   Hepatic Function Panel (6)   Lipase   Ambulatory referral to Gastroenterology   Dyspnea on exertion       Relevant Orders   EKG 12-Lead (Completed)   DG Chest 2 View   Ambulatory referral to Cardiology   Abnormal EKG       Relevant Orders   Ambulatory referral to Cardiology   Atrial fibrillation, unspecified type ([place])        new onset    more than 30 minutes was spent with patient     Dr. [facility]  02/15/16

## 2016-02-16 ENCOUNTER — Other Ambulatory Visit: Payer: Self-pay

## 2016-02-16 LAB — LIPASE: Lipase: 44 U/L (ref 14–85)

## 2016-02-16 LAB — HEPATIC FUNCTION PANEL (6)
ALT: 17 IU/L (ref 0–32)
AST: 31 IU/L (ref 0–40)
Albumin: 4.4 g/dL (ref 3.5–4.8)
Alkaline Phosphatase: 54 IU/L (ref 39–117)
Bilirubin Total: 1.1 mg/dL (ref 0.0–1.2)
Bilirubin, Direct: 0.29 mg/dL (ref 0.00–0.40)

## 2016-02-20 DIAGNOSIS — E782 Mixed hyperlipidemia: Secondary | ICD-10-CM | POA: Insufficient documentation

## 2016-02-20 DIAGNOSIS — I10 Essential (primary) hypertension: Secondary | ICD-10-CM | POA: Diagnosis not present

## 2016-02-20 DIAGNOSIS — I4891 Unspecified atrial fibrillation: Secondary | ICD-10-CM | POA: Diagnosis not present

## 2016-02-20 DIAGNOSIS — R0600 Dyspnea, unspecified: Secondary | ICD-10-CM | POA: Insufficient documentation

## 2016-02-20 DIAGNOSIS — R0602 Shortness of breath: Secondary | ICD-10-CM | POA: Diagnosis not present

## 2016-02-22 ENCOUNTER — Other Ambulatory Visit: Payer: Self-pay | Admitting: Student

## 2016-02-22 ENCOUNTER — Ambulatory Visit
Admission: RE | Admit: 2016-02-22 | Discharge: 2016-02-22 | Disposition: A | Discharge status: Home / Self Care | Payer: Medicare Other | Source: Ambulatory Visit | Attending: Student | Admitting: Student

## 2016-02-22 DIAGNOSIS — Z86010 Personal history of colonic polyps: Secondary | ICD-10-CM | POA: Diagnosis not present

## 2016-02-22 DIAGNOSIS — R634 Abnormal weight loss: Secondary | ICD-10-CM | POA: Diagnosis not present

## 2016-02-22 DIAGNOSIS — R1013 Epigastric pain: Secondary | ICD-10-CM | POA: Insufficient documentation

## 2016-02-22 DIAGNOSIS — K449 Diaphragmatic hernia without obstruction or gangrene: Secondary | ICD-10-CM | POA: Diagnosis not present

## 2016-02-23 DIAGNOSIS — R0602 Shortness of breath: Secondary | ICD-10-CM | POA: Diagnosis not present

## 2016-02-27 DIAGNOSIS — I481 Persistent atrial fibrillation: Secondary | ICD-10-CM | POA: Diagnosis not present

## 2016-02-27 DIAGNOSIS — I10 Essential (primary) hypertension: Secondary | ICD-10-CM | POA: Diagnosis not present

## 2016-02-27 DIAGNOSIS — E782 Mixed hyperlipidemia: Secondary | ICD-10-CM | POA: Diagnosis not present

## 2016-03-13 DIAGNOSIS — I4891 Unspecified atrial fibrillation: Secondary | ICD-10-CM | POA: Diagnosis not present

## 2016-03-13 DIAGNOSIS — I481 Persistent atrial fibrillation: Secondary | ICD-10-CM | POA: Diagnosis not present

## 2016-03-14 ENCOUNTER — Other Ambulatory Visit: Payer: Self-pay | Admitting: Family Medicine

## 2016-03-14 DIAGNOSIS — F32A Depression, unspecified: Secondary | ICD-10-CM

## 2016-03-14 DIAGNOSIS — I10 Essential (primary) hypertension: Secondary | ICD-10-CM

## 2016-03-14 DIAGNOSIS — F329 Major depressive disorder, single episode, unspecified: Secondary | ICD-10-CM

## 2016-03-14 DIAGNOSIS — F339 Major depressive disorder, recurrent, unspecified: Secondary | ICD-10-CM

## 2016-03-16 ENCOUNTER — Other Ambulatory Visit: Payer: Self-pay | Admitting: Family Medicine

## 2016-03-20 DIAGNOSIS — I4891 Unspecified atrial fibrillation: Secondary | ICD-10-CM | POA: Diagnosis present

## 2016-03-21 ENCOUNTER — Ambulatory Visit
Admission: RE | Admit: 2016-03-21 | Discharge: 2016-03-21 | Disposition: A | Discharge status: Home / Self Care | Payer: Medicare Other | Source: Ambulatory Visit | Attending: Internal Medicine | Admitting: Internal Medicine

## 2016-03-21 ENCOUNTER — Ambulatory Visit: Payer: Medicare Other | Admitting: Anesthesiology

## 2016-03-21 ENCOUNTER — Encounter
Admission: RE | Disposition: A | Discharge status: Home / Self Care | Payer: Self-pay | Source: Ambulatory Visit | Attending: Internal Medicine

## 2016-03-21 ENCOUNTER — Encounter: Payer: Self-pay | Admitting: *Deleted

## 2016-03-21 DIAGNOSIS — I10 Essential (primary) hypertension: Secondary | ICD-10-CM | POA: Insufficient documentation

## 2016-03-21 DIAGNOSIS — F329 Major depressive disorder, single episode, unspecified: Secondary | ICD-10-CM | POA: Insufficient documentation

## 2016-03-21 DIAGNOSIS — I480 Paroxysmal atrial fibrillation: Secondary | ICD-10-CM | POA: Insufficient documentation

## 2016-03-21 DIAGNOSIS — I481 Persistent atrial fibrillation: Secondary | ICD-10-CM | POA: Diagnosis not present

## 2016-03-21 DIAGNOSIS — K219 Gastro-esophageal reflux disease without esophagitis: Secondary | ICD-10-CM | POA: Diagnosis not present

## 2016-03-21 DIAGNOSIS — Z853 Personal history of malignant neoplasm of breast: Secondary | ICD-10-CM | POA: Insufficient documentation

## 2016-03-21 DIAGNOSIS — Z7901 Long term (current) use of anticoagulants: Secondary | ICD-10-CM | POA: Diagnosis not present

## 2016-03-21 DIAGNOSIS — Z79899 Other long term (current) drug therapy: Secondary | ICD-10-CM | POA: Insufficient documentation

## 2016-03-21 DIAGNOSIS — I4891 Unspecified atrial fibrillation: Secondary | ICD-10-CM | POA: Diagnosis not present

## 2016-03-21 DIAGNOSIS — E785 Hyperlipidemia, unspecified: Secondary | ICD-10-CM | POA: Insufficient documentation

## 2016-03-21 HISTORY — PX: CARDIOVERSION: EP1203

## 2016-03-21 SURGERY — CARDIOVERSION (CATH LAB)
Anesthesia: General

## 2016-03-21 MED ORDER — PROPOFOL 10 MG/ML IV BOLUS
INTRAVENOUS | Status: AC
  Filled 2016-03-21: qty 40

## 2016-03-21 MED ORDER — SODIUM CHLORIDE 0.9 % IV SOLN
INTRAVENOUS | Status: DC
  Administered 2016-03-21: 07:00:00 via INTRAVENOUS

## 2016-03-21 MED ORDER — PROPOFOL 10 MG/ML IV BOLUS
INTRAVENOUS | Status: DC | PRN
  Administered 2016-03-21: 60 mg via INTRAVENOUS

## 2016-03-21 NOTE — Discharge Instructions (Signed)
Electrical Cardioversion, Care After °This sheet gives you information about how to care for yourself after your procedure. Your health care provider may also give you more specific instructions. If you have problems or questions, contact your health care provider. °What can I expect after the procedure? °After the procedure, it is common to have: °· Some redness on the skin where the shocks were given. °Follow these instructions at home: °· Do not drive for 24 hours if you were given a medicine to help you relax (sedative). °· Take over-the-counter and prescription medicines only as told by your health care provider. °· Ask your health care provider how to check your pulse. Check it often. °· Rest for 48 hours after the procedure or as told by your health care provider. °· Avoid or limit your caffeine use as told by your health care provider. °Contact a health care provider if: °· You feel like your heart is beating too quickly or your pulse is not regular. °· You have a serious muscle cramp that does not go away. °Get help right away if: °· You have discomfort in your chest. °· You are dizzy or you feel faint. °· You have trouble breathing or you are short of breath. °· Your speech is slurred. °· You have trouble moving an arm or leg on one side of your body. °· Your fingers or toes turn cold or blue. °This information is not intended to replace advice given to you by your health care provider. Make sure you discuss any questions you have with your health care provider. °Document Released: 10/28/2012 Document Revised: 08/11/2015 Document Reviewed: 07/14/2015 °Elsevier Interactive Patient Education © 2017 [employer] ° °

## 2016-03-21 NOTE — Anesthesia Postprocedure Evaluation (Signed)
Anesthesia Post Note  Patient: Melissa Chase  Procedure(s) Performed: Procedure(s) (LRB): Cardioversion (N/A)  Patient location during evaluation: PACU Anesthesia Type: General Level of consciousness: awake and alert and oriented Pain management: pain level controlled Vital Signs Assessment: post-procedure vital signs reviewed and stable Respiratory status: spontaneous breathing Cardiovascular status: blood pressure returned to baseline Anesthetic complications: no     Last Vitals:  Vitals:   03/21/16 0800 03/21/16 0815  BP: 137/72 125/72  Pulse: (!) 44 (!) 45  Resp: 15 15  Temp:      Last Pain:  Vitals:   03/21/16 0636  TempSrc: Oral                 Yari Szeliga

## 2016-03-21 NOTE — Anesthesia Post-op Follow-up Note (Cosign Needed)
Anesthesia QCDR form completed.        

## 2016-03-21 NOTE — Anesthesia Preprocedure Evaluation (Addendum)
Anesthesia Evaluation  Patient identified by MRN, date of birth, ID band Patient awake    Reviewed: Allergy & Precautions, NPO status , Patient's Chart, lab work & pertinent test results, reviewed documented beta blocker date and time   Airway Mallampati: III  TM Distance: <3 FB     Dental  (+) Chipped   Pulmonary neg pulmonary ROS,    Pulmonary exam normal        Cardiovascular hypertension, Pt. on medications and Pt. on home beta blockers Normal cardiovascular exam+ dysrhythmias Atrial Fibrillation      Neuro/Psych PSYCHIATRIC DISORDERS Depression negative neurological ROS     GI/Hepatic Neg liver ROS, GERD  Medicated and Controlled,  Endo/Other  negative endocrine ROS  Renal/GU negative Renal ROS  negative genitourinary   Musculoskeletal  (+) Arthritis , Osteoarthritis,    Abdominal Normal abdominal exam  (+)   Peds negative pediatric ROS (+)  Hematology negative hematology ROS (+)   Anesthesia Other Findings   Reproductive/Obstetrics                           Anesthesia Physical Anesthesia Plan  ASA: III  Anesthesia Plan: General   Post-op Pain Management:    Induction: Intravenous  Airway Management Planned: Nasal Cannula  Additional Equipment:   Intra-op Plan:   Post-operative Plan:   Informed Consent: I have reviewed the patients History and Physical, chart, labs and discussed the procedure including the risks, benefits and alternatives for the proposed anesthesia with the patient or authorized representative who has indicated his/her understanding and acceptance.   Dental advisory given  Plan Discussed with: CRNA and Surgeon  Anesthesia Plan Comments:         Anesthesia Quick Evaluation

## 2016-03-21 NOTE — Transfer of Care (Signed)
Immediate Anesthesia Transfer of Care Note  Patient: Melissa Chase  Procedure(s) Performed: Procedure(s): Cardioversion (N/A)  Patient Location: PACU  Anesthesia Type:General  Level of Consciousness: awake, alert  and oriented  Airway & Oxygen Therapy: Patient connected to nasal cannula oxygen  Post-op Assessment: Post -op Vital signs reviewed and stable  Post vital signs: stable  Last Vitals:  Vitals:   03/21/16 0734 03/21/16 0735  BP:  111/63  Pulse: (!) 49 (!) 47  Resp: 20 (!) 21  Temp:      Last Pain:  Vitals:   03/21/16 0636  TempSrc: Oral         Complications: No apparent anesthesia complications

## 2016-03-21 NOTE — CV Procedure (Signed)
Electrical Cardioversion Procedure Note Melissa Chase [id number] 23-Sep-1940  Procedure: Electrical Cardioversion Indications:  Atrial Fibrillation  Procedure Details Consent: Risks of procedure as well as the alternatives and risks of each were explained to the (patient/caregiver).  Consent for procedure obtained. Time Out: Verified patient identification, verified procedure, site/side was marked, verified correct patient position, special equipment/implants available, medications/allergies/relevent history reviewed, required imaging and test results available.  Performed  Patient placed on cardiac monitor, pulse oximetry, supplemental oxygen as necessary.  Sedation given: Benzodiazepines and Short-acting barbiturates Pacer pads placed anterior and posterior chest.  Cardioverted 1 time(s).  Cardioverted at 120J.  Evaluation Findings: Post procedure EKG shows: NSR Complications: None Patient did tolerate procedure well.   Corey Skains 03/21/2016, 7:47 AM

## 2016-04-04 DIAGNOSIS — R001 Bradycardia, unspecified: Secondary | ICD-10-CM | POA: Insufficient documentation

## 2016-04-04 DIAGNOSIS — I4891 Unspecified atrial fibrillation: Secondary | ICD-10-CM | POA: Diagnosis not present

## 2016-04-14 ENCOUNTER — Other Ambulatory Visit: Payer: Self-pay | Admitting: Family Medicine

## 2016-04-14 DIAGNOSIS — F339 Major depressive disorder, recurrent, unspecified: Secondary | ICD-10-CM

## 2016-04-14 DIAGNOSIS — F32A Depression, unspecified: Secondary | ICD-10-CM

## 2016-04-14 DIAGNOSIS — F329 Major depressive disorder, single episode, unspecified: Secondary | ICD-10-CM

## 2016-04-14 DIAGNOSIS — I10 Essential (primary) hypertension: Secondary | ICD-10-CM

## 2016-04-30 ENCOUNTER — Other Ambulatory Visit: Payer: Self-pay | Admitting: Family Medicine

## 2016-05-02 DIAGNOSIS — R001 Bradycardia, unspecified: Secondary | ICD-10-CM | POA: Diagnosis not present

## 2016-05-02 DIAGNOSIS — I420 Dilated cardiomyopathy: Secondary | ICD-10-CM | POA: Insufficient documentation

## 2016-05-02 DIAGNOSIS — I4891 Unspecified atrial fibrillation: Secondary | ICD-10-CM | POA: Diagnosis not present

## 2016-05-02 DIAGNOSIS — I10 Essential (primary) hypertension: Secondary | ICD-10-CM | POA: Diagnosis not present

## 2016-05-10 ENCOUNTER — Ambulatory Visit: Payer: Medicare Other | Admitting: Family Medicine

## 2016-05-21 ENCOUNTER — Ambulatory Visit: Payer: Medicare Other | Admitting: Family Medicine

## 2016-05-23 ENCOUNTER — Ambulatory Visit: Payer: Medicare Other | Admitting: Family Medicine

## 2016-06-12 ENCOUNTER — Other Ambulatory Visit: Payer: Self-pay | Admitting: Family Medicine

## 2016-06-12 DIAGNOSIS — F32A Depression, unspecified: Secondary | ICD-10-CM

## 2016-06-12 DIAGNOSIS — F339 Major depressive disorder, recurrent, unspecified: Secondary | ICD-10-CM

## 2016-06-12 DIAGNOSIS — I10 Essential (primary) hypertension: Secondary | ICD-10-CM

## 2016-06-12 DIAGNOSIS — F329 Major depressive disorder, single episode, unspecified: Secondary | ICD-10-CM

## 2016-07-09 ENCOUNTER — Other Ambulatory Visit: Payer: Self-pay

## 2016-07-18 ENCOUNTER — Ambulatory Visit (INDEPENDENT_AMBULATORY_CARE_PROVIDER_SITE_OTHER): Payer: Medicare Other | Admitting: Family Medicine

## 2016-07-18 ENCOUNTER — Encounter: Payer: Self-pay | Admitting: Family Medicine

## 2016-07-18 VITALS — BP 123/68 | HR 62 | Ht 65.0 in | Wt 229.0 lb

## 2016-07-18 DIAGNOSIS — F339 Major depressive disorder, recurrent, unspecified: Secondary | ICD-10-CM | POA: Diagnosis not present

## 2016-07-18 DIAGNOSIS — H1033 Unspecified acute conjunctivitis, bilateral: Secondary | ICD-10-CM

## 2016-07-18 DIAGNOSIS — F3341 Major depressive disorder, recurrent, in partial remission: Secondary | ICD-10-CM | POA: Diagnosis not present

## 2016-07-18 MED ORDER — DULOXETINE HCL 30 MG PO CPEP: 30.0000 mg | ORAL_CAPSULE | Freq: Every day | ORAL | 2 refills | Status: DC

## 2016-07-18 MED ORDER — SULFACETAMIDE SODIUM 10 % OP SOLN: 1.0000 [drp] | OPHTHALMIC | 0 refills | Status: DC

## 2016-07-18 NOTE — Progress Notes (Signed)
Name: Melissa Chase   MRN: [id number]    DOB: 05/31/1940   Date:07/18/2016       Progress Note  Subjective  Chief Complaint  No chief complaint on file.   Eye Problem   Both eyes are affected.This is a recurrent problem. The current episode started 1 to 4 weeks ago. The problem occurs daily. The problem has been waxing and waning. The pain is mild. Pertinent negatives include no blurred vision, eye discharge, double vision, eye redness, fever, foreign body sensation, itching, nausea, photophobia, recent URI, tingling or vomiting. The treatment provided mild relief.    No problem-specific Assessment & Plan notes found for this encounter.   Past Medical History:  Diagnosis Date  . Depression   . GERD (gastroesophageal reflux disease)   . Hypertension   . Insomnia   . Insomnia     Past Surgical History:  Procedure Laterality Date  . CARDIOVERSION N/A 03/21/2016   Procedure: Cardioversion;  Surgeon: Corey Skains, MD;  Location: [facility] ORS;  Service: Cardiovascular;  Laterality: N/A;  . LAPAROSCOPIC CHOLECYSTECTOMY    . TONSILLECTOMY    . VAGINAL HYSTERECTOMY      Family History  Problem Relation Age of Onset  . Diabetes Sister     Social History   Social History  . Marital status: Married    Spouse name: N/A  . Number of children: N/A  . Years of education: N/A   Occupational History  . Not on file.   Social History Main Topics  . Smoking status: Never Smoker  . Smokeless tobacco: Never Used  . Alcohol use 0.0 oz/week  . Drug use: No  . Sexual activity: No   Other Topics Concern  . Not on file   Social History Narrative  . No narrative on file    No Known Allergies  Outpatient Medications Prior to Visit  Medication Sig Dispense Refill  . amiodarone (PACERONE) 400 MG tablet Take 400 mg by mouth daily.    Marland Kitchen apixaban (ELIQUIS) 5 MG TABS tablet Take 5 mg by mouth 2 (two) times daily.    . hydrochlorothiazide (MICROZIDE) 12.5 MG capsule TAKE 1 CAPSULE  (12.5 MG TOTAL) BY MOUTH DAILY. 30 capsule 1  . metoprolol succinate (TOPROL-XL) 100 MG 24 hr tablet TAKE 1 TABLET (100 MG TOTAL) BY MOUTH DAILY. TAKE WITH OR IMMEDIATELY FOLLOWING A MEAL. 30 tablet 1  . Multiple Vitamins-Minerals (MULTIVITAMIN WITH MINERALS) tablet Take 1 tablet by mouth daily.    . Omega-3 1000 MG CAPS Take 1 capsule by mouth daily.    Marland Kitchen omeprazole (PRILOSEC) 40 MG capsule Take 40 mg by mouth daily.    . celecoxib (CELEBREX) 200 MG capsule TAKE 1 CAPSULE (200 MG TOTAL) BY MOUTH DAILY. 90 capsule 0  . sertraline (ZOLOFT) 100 MG tablet TAKE 1 TABLET (100 MG TOTAL) BY MOUTH DAILY. 30 tablet 1   No facility-administered medications prior to visit.     Review of Systems  Constitutional: Negative for chills, fever, malaise/fatigue and weight loss.  HENT: Negative for ear discharge, ear pain and sore throat.   Eyes: Negative for blurred vision, double vision, photophobia, discharge, redness and itching.  Respiratory: Negative for cough, sputum production, shortness of breath and wheezing.   Cardiovascular: Negative for chest pain, palpitations and leg swelling.  Gastrointestinal: Negative for abdominal pain, blood in stool, constipation, diarrhea, heartburn, melena, nausea and vomiting.  Genitourinary: Negative for dysuria, frequency, hematuria and urgency.  Musculoskeletal: Negative for back pain, joint pain, myalgias  and neck pain.  Skin: Negative for rash.  Neurological: Negative for dizziness, tingling, sensory change, focal weakness and headaches.  Endo/Heme/Allergies: Negative for environmental allergies and polydipsia. Does not bruise/bleed easily.  Psychiatric/Behavioral: Negative for depression and suicidal ideas. The patient is not nervous/anxious and does not have insomnia.      Objective  Vitals:   07/18/16 1108  BP: 123/68  Pulse: 62  SpO2: 98%  Weight: 229 lb (103.9 kg)  Height: 5\' 5"  (1.651 m)    Physical Exam  Constitutional: She is well-developed,  well-nourished, and in no distress. No distress.  HENT:  Head: Normocephalic and atraumatic.  Right Ear: External ear normal.  Left Ear: External ear normal.  Nose: Nose normal.  Mouth/Throat: Oropharynx is clear and moist.  Eyes: Conjunctivae and EOM are normal. Pupils are equal, round, and reactive to light. Right eye exhibits no discharge. Left eye exhibits no discharge.  Neck: Normal range of motion. Neck supple. No JVD present. No thyromegaly present.  Cardiovascular: Normal rate, regular rhythm, normal heart sounds and intact distal pulses.  Exam reveals no gallop and no friction rub.   No murmur heard. Pulmonary/Chest: Effort normal and breath sounds normal. She has no wheezes. She has no rales.  Abdominal: Soft. Bowel sounds are normal. She exhibits no mass. There is no tenderness. There is no guarding.  Musculoskeletal: Normal range of motion. She exhibits no edema.  Lymphadenopathy:    She has no cervical adenopathy.  Neurological: She is alert. She has normal reflexes.  Skin: Skin is warm and dry. She is not diaphoretic.  Psychiatric: Mood and affect normal.  Nursing note and vitals reviewed.     Assessment & Plan  Problem List Items Addressed This Visit      Other   Recurrent major depressive episodes ([place])   Relevant Medications   DULoxetine (CYMBALTA) 30 MG capsule    Other Visit Diagnoses    Acute bacterial conjunctivitis of both eyes    -  Primary   Relevant Medications   sulfacetamide (BLEPH-10) 10 % ophthalmic solution   Recurrent major depressive disorder, in partial remission (H[facility])       Relevant Medications   DULoxetine (CYMBALTA) 30 MG capsule      Meds ordered this encounter  Medications  . sulfacetamide (BLEPH-10) 10 % ophthalmic solution    Sig: Place 1 drop into both eyes every 3 (three) hours.    Dispense:  15 mL    Refill:  0  . DULoxetine (CYMBALTA) 30 MG capsule    Sig: Take 1 capsule (30 mg total) by mouth daily.    Dispense:  30  capsule    Refill:  2      Dr. [facility]  07/18/16

## 2016-07-22 ENCOUNTER — Other Ambulatory Visit: Payer: Self-pay | Admitting: Family Medicine

## 2016-07-30 ENCOUNTER — Ambulatory Visit: Payer: Medicare Other

## 2016-07-30 ENCOUNTER — Ambulatory Visit
Admission: RE | Admit: 2016-07-30 | Discharge: 2016-07-30 | Disposition: A | Discharge status: Home / Self Care | Payer: Medicare Other | Source: Ambulatory Visit | Attending: Family Medicine | Admitting: Family Medicine

## 2016-07-30 ENCOUNTER — Encounter: Payer: Self-pay | Admitting: Family Medicine

## 2016-07-30 ENCOUNTER — Ambulatory Visit (INDEPENDENT_AMBULATORY_CARE_PROVIDER_SITE_OTHER): Payer: Medicare Other | Admitting: Family Medicine

## 2016-07-30 VITALS — BP 120/80 | HR 68 | Ht 65.0 in | Wt 225.0 lb

## 2016-07-30 DIAGNOSIS — N949 Unspecified condition associated with female genital organs and menstrual cycle: Secondary | ICD-10-CM

## 2016-07-30 DIAGNOSIS — R938 Abnormal findings on diagnostic imaging of other specified body structures: Secondary | ICD-10-CM | POA: Diagnosis not present

## 2016-07-30 DIAGNOSIS — Z90710 Acquired absence of both cervix and uterus: Secondary | ICD-10-CM | POA: Diagnosis not present

## 2016-07-30 DIAGNOSIS — K219 Gastro-esophageal reflux disease without esophagitis: Secondary | ICD-10-CM | POA: Diagnosis not present

## 2016-07-30 DIAGNOSIS — N898 Other specified noninflammatory disorders of vagina: Secondary | ICD-10-CM | POA: Diagnosis not present

## 2016-07-30 DIAGNOSIS — Z1211 Encounter for screening for malignant neoplasm of colon: Secondary | ICD-10-CM | POA: Diagnosis not present

## 2016-07-30 DIAGNOSIS — I10 Essential (primary) hypertension: Secondary | ICD-10-CM | POA: Diagnosis not present

## 2016-07-30 DIAGNOSIS — E784 Other hyperlipidemia: Secondary | ICD-10-CM | POA: Diagnosis not present

## 2016-07-30 DIAGNOSIS — R102 Pelvic and perineal pain: Secondary | ICD-10-CM | POA: Insufficient documentation

## 2016-07-30 DIAGNOSIS — Z1272 Encounter for screening for malignant neoplasm of vagina: Secondary | ICD-10-CM | POA: Diagnosis not present

## 2016-07-30 DIAGNOSIS — Z1239 Encounter for other screening for malignant neoplasm of breast: Secondary | ICD-10-CM

## 2016-07-30 DIAGNOSIS — Z0000 Encounter for general adult medical examination without abnormal findings: Secondary | ICD-10-CM

## 2016-07-30 DIAGNOSIS — Z1231 Encounter for screening mammogram for malignant neoplasm of breast: Secondary | ICD-10-CM

## 2016-07-30 DIAGNOSIS — M810 Age-related osteoporosis without current pathological fracture: Secondary | ICD-10-CM | POA: Diagnosis not present

## 2016-07-30 DIAGNOSIS — E7849 Other hyperlipidemia: Secondary | ICD-10-CM

## 2016-07-30 DIAGNOSIS — N831 Corpus luteum cyst of ovary, unspecified side: Secondary | ICD-10-CM | POA: Diagnosis not present

## 2016-07-30 LAB — HEMOCCULT GUIAC POC 1CARD (OFFICE): Fecal Occult Blood, POC: NEGATIVE

## 2016-07-30 NOTE — Progress Notes (Signed)
Name: Melissa Chase   MRN: [id number]    DOB: [birth year]   Date:07/30/2016       Progress Note  Subjective  Chief Complaint  Chief Complaint  Patient presents with  . Annual Exam    pap- needs TDAP, Pneum, bone density and mammo    Patient presents for complete physical exam.   Gynecologic Exam  The patient's pertinent negatives include no genital itching, genital lesions, genital odor, genital rash, missed menses, pelvic pain, vaginal bleeding or vaginal discharge. Primary symptoms comment: vaginal discomfort/hardness. This is a new problem. The current episode started 1 to 4 weeks ago (1 month ago). The problem has been waxing and waning. The patient is experiencing no pain. Pertinent negatives include no abdominal pain, back pain, chills, constipation, diarrhea, dysuria, fever, flank pain, frequency, headaches, hematuria, joint pain, nausea, rash, sore throat or urgency. Associated symptoms comments: Little discharge. Nothing aggravates the symptoms. Treatments tried: douching. The treatment provided mild relief. Her past medical history is significant for a gynecological surgery. There is no history of an ectopic pregnancy, menorrhagia, metrorrhagia, miscarriage, PID or vaginosis.    No problem-specific Assessment & Plan notes found for this encounter.   Past Medical History:  Diagnosis Date  . Depression   . GERD (gastroesophageal reflux disease)   . Hypertension   . Insomnia   . Insomnia     Past Surgical History:  Procedure Laterality Date  . CARDIOVERSION N/A 03/21/2016   Procedure: Cardioversion;  Surgeon: Corey Skains, MD;  Location: [facility] ORS;  Service: Cardiovascular;  Laterality: N/A;  . LAPAROSCOPIC CHOLECYSTECTOMY    . TONSILLECTOMY    . VAGINAL HYSTERECTOMY      Family History  Problem Relation Age of Onset  . Diabetes Sister     Social History   Social History  . Marital status: Married    Spouse name: N/A  . Number of children: N/A  . Years of  education: N/A   Occupational History  . Not on file.   Social History Main Topics  . Smoking status: Never Smoker  . Smokeless tobacco: Never Used  . Alcohol use 0.0 oz/week  . Drug use: No  . Sexual activity: No   Other Topics Concern  . Not on file   Social History Narrative  . No narrative on file    No Known Allergies  Outpatient Medications Prior to Visit  Medication Sig Dispense Refill  . amiodarone (PACERONE) 400 MG tablet Take 200 mg by mouth daily. Dr Nehemiah Massed    . apixaban (ELIQUIS) 5 MG TABS tablet Take 5 mg by mouth 2 (two) times daily.    . celecoxib (CELEBREX) 200 MG capsule TAKE 1 CAPSULE (200 MG TOTAL) BY MOUTH DAILY. 90 capsule 0  . DULoxetine (CYMBALTA) 30 MG capsule Take 1 capsule (30 mg total) by mouth daily. 30 capsule 2  . hydrochlorothiazide (MICROZIDE) 12.5 MG capsule TAKE 1 CAPSULE (12.5 MG TOTAL) BY MOUTH DAILY. 30 capsule 1  . metoprolol succinate (TOPROL-XL) 100 MG 24 hr tablet TAKE 1 TABLET (100 MG TOTAL) BY MOUTH DAILY. TAKE WITH OR IMMEDIATELY FOLLOWING A MEAL. 30 tablet 1  . Multiple Vitamins-Minerals (MULTIVITAMIN WITH MINERALS) tablet Take 1 tablet by mouth daily.    . Omega-3 1000 MG CAPS Take 1 capsule by mouth daily.    Marland Kitchen omeprazole (PRILOSEC) 40 MG capsule Take 40 mg by mouth daily.    Marland Kitchen sulfacetamide (BLEPH-10) 10 % ophthalmic solution Place 1 drop into both eyes every 3 (three)  hours. 15 mL 0   No facility-administered medications prior to visit.     Review of Systems  Constitutional: Negative for chills, fever, malaise/fatigue and weight loss.  HENT: Negative for ear discharge, ear pain and sore throat.   Eyes: Negative for blurred vision.  Respiratory: Negative for cough, sputum production, shortness of breath and wheezing.   Cardiovascular: Negative for chest pain, palpitations and leg swelling.  Gastrointestinal: Negative for abdominal pain, blood in stool, constipation, diarrhea, heartburn, melena and nausea.  Genitourinary:  Negative for dysuria, flank pain, frequency, hematuria, menorrhagia, missed menses, pelvic pain, urgency and vaginal discharge.  Musculoskeletal: Negative for back pain, joint pain, myalgias and neck pain.  Skin: Negative for rash.  Neurological: Negative for dizziness, tingling, sensory change, focal weakness and headaches.  Endo/Heme/Allergies: Negative for environmental allergies and polydipsia. Does not bruise/bleed easily.  Psychiatric/Behavioral: Negative for depression and suicidal ideas. The patient is not nervous/anxious and does not have insomnia.      Objective  Vitals:   07/30/16 0948  BP: 120/80  Pulse: 68  Weight: 225 lb (102.1 kg)  Height: 5\' 5"  (1.651 m)    Physical Exam  Constitutional: She is well-developed, well-nourished, and in no distress. No distress.  HENT:  Head: Normocephalic and atraumatic.  Right Ear: External ear normal.  Left Ear: External ear normal.  Nose: Nose normal.  Mouth/Throat: Oropharynx is clear and moist.  Eyes: Conjunctivae and EOM are normal. Pupils are equal, round, and reactive to light. Right eye exhibits no discharge. Left eye exhibits no discharge.  Neck: Normal range of motion. Neck supple. No JVD present. No thyromegaly present.  Cardiovascular: Normal rate, regular rhythm, normal heart sounds and intact distal pulses.  Exam reveals no gallop and no friction rub.   No murmur heard. Pulmonary/Chest: Effort normal and breath sounds normal. She has no wheezes. She has no rales.  Abdominal: Soft. Bowel sounds are normal. She exhibits no mass. There is no tenderness. There is no guarding.  Genitourinary: Right adnexa normal, left adnexa normal and vulva normal. Rectal exam shows external hemorrhoid. Rectal exam shows guaiac negative stool. Vagina exhibits lesion. No vaginal discharge found.  Musculoskeletal: Normal range of motion. She exhibits no edema.  Lymphadenopathy:    She has no cervical adenopathy.  Neurological: She is alert.  She has normal reflexes.  Skin: Skin is warm and dry. She is not diaphoretic.  Psychiatric: Mood and affect normal.  Nursing note and vitals reviewed.     Assessment & Plan  Problem List Items Addressed This Visit      Cardiovascular and Mediastinum   Essential (primary) hypertension   Relevant Orders   Renal function panel     Digestive   Gastro-esophageal reflux disease without esophagitis   Relevant Orders   Ambulatory referral to Gastroenterology     Other   Familial multiple lipoprotein-type hyperlipidemia   Relevant Orders   Lipid Profile   Renal function panel    Other Visit Diagnoses    Annual physical exam    -  Primary   Relevant Orders   Pap IG (Image Guided)   Breast cancer screening       Relevant Orders   MM Digital Screening   Osteoporosis, unspecified osteoporosis type, unspecified pathological fracture presence       Relevant Orders   DG Bone Density   Vaginal lesion       near right introitis   Relevant Orders   US Pelvis Complete   US Transvaginal Non-OB  Pap IG (Image Guided)   Ambulatory referral to Gynecology   Adnexal fullness       Relevant Orders   US Pelvis Complete   US Transvaginal Non-OB   Pelvic pain       Relevant Orders   US Pelvis Complete   Pap IG (Image Guided)   Colon cancer screening       Relevant Orders   Ambulatory referral to Gastroenterology   POCT occult blood stool (Completed)   Screening for vaginal cancer       Relevant Orders   Pap IG (Image Guided)      No orders of the defined types were placed in this encounter.  I spent 60 minutes with this patient, More than 50% of that time was spent in face to face education, counseling and care coordination.   Dr. [facility]  07/30/16

## 2016-07-31 LAB — RENAL FUNCTION PANEL
Albumin: 4.5 g/dL (ref 3.5–4.8)
BUN/Creatinine Ratio: 13 (ref 12–28)
BUN: 14 mg/dL (ref 8–27)
CO2: 26 mmol/L (ref 20–29)
Calcium: 9.6 mg/dL (ref 8.7–10.3)
Chloride: 104 mmol/L (ref 96–106)
Creatinine, Ser: 1.1 mg/dL — ABNORMAL HIGH (ref 0.57–1.00)
GFR calc Af Amer: 57 mL/min/{1.73_m2} — ABNORMAL LOW (ref >59)
GFR calc non Af Amer: 50 mL/min/{1.73_m2} — ABNORMAL LOW (ref >59)
Glucose: 85 mg/dL (ref 65–99)
Phosphorus: 3.3 mg/dL (ref 2.5–4.5)
Potassium: 4.7 mmol/L (ref 3.5–5.2)
Sodium: 145 mmol/L — ABNORMAL HIGH (ref 134–144)

## 2016-07-31 LAB — LIPID PANEL
Chol/HDL Ratio: 3.8 ratio (ref 0.0–4.4)
Cholesterol, Total: 276 mg/dL — ABNORMAL HIGH (ref 100–199)
HDL: 72 mg/dL (ref >39)
LDL Calculated: 181 mg/dL — ABNORMAL HIGH (ref 0–99)
Triglycerides: 117 mg/dL (ref 0–149)
VLDL Cholesterol Cal: 23 mg/dL (ref 5–40)

## 2016-08-02 LAB — PAP IG (IMAGE GUIDED): PAP Smear Comment: 0

## 2016-08-03 ENCOUNTER — Other Ambulatory Visit: Payer: Self-pay | Admitting: Family Medicine

## 2016-08-12 ENCOUNTER — Other Ambulatory Visit: Payer: Self-pay | Admitting: Family Medicine

## 2016-08-12 DIAGNOSIS — F339 Major depressive disorder, recurrent, unspecified: Secondary | ICD-10-CM

## 2016-08-12 DIAGNOSIS — F32A Depression, unspecified: Secondary | ICD-10-CM

## 2016-08-12 DIAGNOSIS — F329 Major depressive disorder, single episode, unspecified: Secondary | ICD-10-CM

## 2016-08-14 DIAGNOSIS — R001 Bradycardia, unspecified: Secondary | ICD-10-CM | POA: Diagnosis not present

## 2016-08-14 DIAGNOSIS — I481 Persistent atrial fibrillation: Secondary | ICD-10-CM | POA: Diagnosis not present

## 2016-08-14 DIAGNOSIS — I10 Essential (primary) hypertension: Secondary | ICD-10-CM | POA: Diagnosis not present

## 2016-08-14 DIAGNOSIS — I420 Dilated cardiomyopathy: Secondary | ICD-10-CM | POA: Diagnosis not present

## 2016-08-14 DIAGNOSIS — R0600 Dyspnea, unspecified: Secondary | ICD-10-CM | POA: Diagnosis not present

## 2016-08-14 DIAGNOSIS — E782 Mixed hyperlipidemia: Secondary | ICD-10-CM | POA: Diagnosis not present

## 2016-08-28 ENCOUNTER — Ambulatory Visit (INDEPENDENT_AMBULATORY_CARE_PROVIDER_SITE_OTHER): Payer: Medicare Other | Admitting: Gastroenterology

## 2016-08-28 ENCOUNTER — Encounter: Payer: Self-pay | Admitting: Gastroenterology

## 2016-08-28 ENCOUNTER — Other Ambulatory Visit: Payer: Self-pay

## 2016-08-28 VITALS — BP 132/71 | HR 50 | Temp 98.7°F | Ht 65.0 in | Wt 224.0 lb

## 2016-08-28 DIAGNOSIS — Z1211 Encounter for screening for malignant neoplasm of colon: Secondary | ICD-10-CM

## 2016-08-28 DIAGNOSIS — N83201 Unspecified ovarian cyst, right side: Secondary | ICD-10-CM | POA: Diagnosis not present

## 2016-08-28 DIAGNOSIS — K219 Gastro-esophageal reflux disease without esophagitis: Secondary | ICD-10-CM

## 2016-08-28 DIAGNOSIS — N898 Other specified noninflammatory disorders of vagina: Secondary | ICD-10-CM | POA: Diagnosis not present

## 2016-08-28 DIAGNOSIS — N83202 Unspecified ovarian cyst, left side: Secondary | ICD-10-CM | POA: Diagnosis not present

## 2016-08-28 NOTE — Progress Notes (Signed)
Gastroenterology Consultation  Referring Provider:     Juline Patch, MD Primary Care Physician:  Juline Patch, MD Primary Gastroenterologist:  Dr. Allen Norris     Reason for Consultation:     Reflux        HPI:   Melissa Chase is a 76 y.o. y/o female referred for consultation & management of Reflux by Dr. Juline Patch, MD.  This patient comes here today for a history of reflux.  The patient states that her reflux is completely gone and she is not having any problems with reflux at this time.  The patient does not really want to talk about her reflux but is more concerned with a lump that she has reported lateral to her anus on the right side.  She reports this lump to be more prominent when she stands up.  She also states that she has a similar bump within her vagina.  There is no report of any dysphagia, fevers, chills, nausea or vomiting.  The patient also denies any change in bowel habits.  She did have a colonoscopy in 2005 and states that she had polyps that time and has not had a repeat colonoscopy since then.  Past Medical History:  Diagnosis Date  . Depression   . GERD (gastroesophageal reflux disease)   . Hypertension   . Insomnia   . Insomnia     Past Surgical History:  Procedure Laterality Date  . CARDIOVERSION N/A 03/21/2016   Procedure: Cardioversion;  Surgeon: Corey Skains, MD;  Location: [facility] ORS;  Service: Cardiovascular;  Laterality: N/A;  . LAPAROSCOPIC CHOLECYSTECTOMY    . TONSILLECTOMY    . VAGINAL HYSTERECTOMY      Prior to Admission medications   Medication Sig Start Date End Date Taking? Authorizing Provider  amiodarone (PACERONE) 400 MG tablet Take 200 mg by mouth daily. Dr Nehemiah Massed   Yes [provider]  apixaban (ELIQUIS) 5 MG TABS tablet Take 5 mg by mouth 2 (two) times daily.   Yes [provider]  hydrochlorothiazide (MICROZIDE) 12.5 MG capsule TAKE 1 CAPSULE (12.5 MG TOTAL) BY MOUTH DAILY. 04/15/16  Yes Juline Patch, MD    metoprolol succinate (TOPROL-XL) 100 MG 24 hr tablet TAKE 1 TABLET (100 MG TOTAL) BY MOUTH DAILY. TAKE WITH OR IMMEDIATELY FOLLOWING A MEAL. 08/05/16  Yes Juline Patch, MD  Multiple Vitamins-Minerals (MULTIVITAMIN WITH MINERALS) tablet Take 1 tablet by mouth daily.   Yes [provider]  omeprazole (PRILOSEC) 40 MG capsule Take 40 mg by mouth daily.   Yes [provider]  pravastatin (PRAVACHOL) 20 MG tablet Take by mouth. 08/14/16 08/14/17 Yes [provider]  sertraline (ZOLOFT) 100 MG tablet TAKE ONE TABLET BY MOUTH DAILY 08/12/16  Yes Juline Patch, MD  celecoxib (CELEBREX) 200 MG capsule TAKE 1 CAPSULE (200 MG TOTAL) BY MOUTH DAILY. Patient not taking: Reported on 08/28/2016 07/22/16   Juline Patch, MD  DULoxetine (CYMBALTA) 30 MG capsule Take 1 capsule (30 mg total) by mouth daily. Patient not taking: Reported on 08/28/2016 07/18/16   Juline Patch, MD  Omega-3 1000 MG CAPS Take 1 capsule by mouth daily.    [provider]    Family History  Problem Relation Age of Onset  . Diabetes Sister      Social History  Substance Use Topics  . Smoking status: Never Smoker  . Smokeless tobacco: Never Used  . Alcohol use 0.0 oz/week    Allergies  as of 08/28/2016  . (No Known Allergies)    Review of Systems:    All systems reviewed and negative except where noted in HPI.   Physical Exam:  BP 132/71   Pulse (!) 50   Temp 98.7 F (37.1 C) (Oral)   Ht 5\' 5"  (1.651 m)   Wt 224 lb (101.6 kg)   BMI 37.28 kg/m  No LMP recorded. Patient has had a hysterectomy. Psych:  Alert and cooperative. Normal mood and affect. General:   Alert,  Well-developed, well-nourished, pleasant and cooperative in NAD Head:  Normocephalic and atraumatic. Eyes:  Sclera clear, no icterus.   Conjunctiva pink. Ears:  Normal auditory acuity. Nose:  No deformity, discharge, or lesions. Mouth:  No deformity or lesions,oropharynx pink & moist. Neck:  Supple; no masses or  thyromegaly. Lungs:  Respirations even and unlabored.  Clear throughout to auscultation.   No wheezes, crackles, or rhonchi. No acute distress. Heart:  Regular rate and rhythm; no murmurs, clicks, rubs, or gallops. Abdomen:  Normal bowel sounds.  No bruits.  Soft, non-tender and non-distended without masses, hepatosplenomegaly or hernias noted.  No guarding or rebound tenderness.  Negative Carnett sign.   Rectal:  External exam shows a lesion that is lateral to her anus and appears to be a lipoma versus a calcified lesion.  Msk:  Symmetrical without gross deformities.  Good, equal movement & strength bilaterally. Pulses:  Normal pulses noted. Extremities:  No clubbing or edema.  No cyanosis. Neurologic:  Alert and oriented x3;  grossly normal neurologically. Skin:  Intact without significant lesions or rashes.  No jaundice. Lymph Nodes:  No significant cervical adenopathy. Psych:  Alert and cooperative. Normal mood and affect.  Imaging Studies: US Transvaginal Non-ob  Result Date: 07/30/2016 CLINICAL DATA:  Initial evaluation for pelvic pain, right adnexal fullness. History of vaginal lesion with prior hysterectomy. EXAM: TRANSABDOMINAL AND TRANSVAGINAL ULTRASOUND OF PELVIS TECHNIQUE: Both transabdominal and transvaginal ultrasound examinations of the pelvis were performed. Transabdominal technique was performed for global imaging of the pelvis including uterus, ovaries, adnexal regions, and pelvic cul-de-sac. It was necessary to proceed with endovaginal exam following the transabdominal exam to visualize the pelvic viscera. COMPARISON:  None available. FINDINGS: Uterus Surgically absent. No abnormality seen at the vaginal cuff. Patient's reported vaginal lesion is not visualized on this exam. Endometrium Surgically absent. Right ovary Measurements: 2.7 x 1.4 x 1.4 cm. Round, well-circumscribed anechoic cystic lesion measuring 1.6 x 1.9 x 1.4 cm is present within the right adnexa. No internal  vascularity or complex features. Lesion is positioned just adjacent to the right ovary, and may reflect an exophytic cyst. Left ovary Measurements: 2.8 x 1.2 x 1.0 cm. Round well-circumscribed anechoic cystic lesion measuring 1.1 x 1.4 x 1.0 cm is present. No internal vascularity or complex features. Lesion is positioned immediately adjacent to the left ovary, and may reflect an exophytic cyst. Other findings No abnormal free fluid. IMPRESSION: 1. Bilateral simple adnexal cysts as above. Based on appearance and size, a 1 year follow-up ultrasound is recommended to document stability. This recommendation follows the consensus statement: Management of Asymptomatic Ovarian and Other Adnexal Cysts Imaged at [place]: Society of Radiologists in [place]. Radiology 2010; [phone]. 2. Status post hysterectomy. Patient is known vaginal lesion is not visualized on this exam. Electronically Signed   By: Jeannine Boga M.D.   On: 07/30/2016 22:03   US Pelvis Complete  Result Date: 07/30/2016 CLINICAL DATA:  Initial evaluation for pelvic pain, right adnexal  fullness. History of vaginal lesion with prior hysterectomy. EXAM: TRANSABDOMINAL AND TRANSVAGINAL ULTRASOUND OF PELVIS TECHNIQUE: Both transabdominal and transvaginal ultrasound examinations of the pelvis were performed. Transabdominal technique was performed for global imaging of the pelvis including uterus, ovaries, adnexal regions, and pelvic cul-de-sac. It was necessary to proceed with endovaginal exam following the transabdominal exam to visualize the pelvic viscera. COMPARISON:  None available. FINDINGS: Uterus Surgically absent. No abnormality seen at the vaginal cuff. Patient's reported vaginal lesion is not visualized on this exam. Endometrium Surgically absent. Right ovary Measurements: 2.7 x 1.4 x 1.4 cm. Round, well-circumscribed anechoic cystic lesion measuring 1.6 x 1.9 x 1.4 cm is present within the right adnexa. No  internal vascularity or complex features. Lesion is positioned just adjacent to the right ovary, and may reflect an exophytic cyst. Left ovary Measurements: 2.8 x 1.2 x 1.0 cm. Round well-circumscribed anechoic cystic lesion measuring 1.1 x 1.4 x 1.0 cm is present. No internal vascularity or complex features. Lesion is positioned immediately adjacent to the left ovary, and may reflect an exophytic cyst. Other findings No abnormal free fluid. IMPRESSION: 1. Bilateral simple adnexal cysts as above. Based on appearance and size, a 1 year follow-up ultrasound is recommended to document stability. This recommendation follows the consensus statement: Management of Asymptomatic Ovarian and Other Adnexal Cysts Imaged at [place]: Society of Radiologists in [place]. Radiology 2010; [phone]. 2. Status post hysterectomy. Patient is known vaginal lesion is not visualized on this exam. Electronically Signed   By: Jeannine Boga M.D.   On: 07/30/2016 22:03    Assessment and Plan:   Raliyah Montella is a 76 y.o. y/o female who comes in today with a history of Heartburn that she states is under control without any reports of problems at this time.  The patient is more concerned about a lesion she has peri-anally.  She has been told that this is likely a lipoma or although she has a visit with her OB/GYN today.  The patient has not had a screening colonoscopy in many years and will be set up for screening colonoscopy. I have discussed risks & benefits which include, but are not limited to, bleeding, infection, perforation & drug reaction.  The patient agrees with this plan & written consent will be obtained.     Melissa Lame, MD. [facility]   Note: This dictation was prepared with Dragon dictation along with smaller phrase technology. Any transcriptional errors that result from this process are unintentional.

## 2016-09-02 ENCOUNTER — Other Ambulatory Visit: Payer: Self-pay

## 2016-09-19 DIAGNOSIS — Z79899 Other long term (current) drug therapy: Secondary | ICD-10-CM | POA: Diagnosis not present

## 2016-09-19 DIAGNOSIS — I480 Paroxysmal atrial fibrillation: Secondary | ICD-10-CM | POA: Diagnosis not present

## 2016-09-19 DIAGNOSIS — R001 Bradycardia, unspecified: Secondary | ICD-10-CM | POA: Diagnosis not present

## 2016-09-19 DIAGNOSIS — E782 Mixed hyperlipidemia: Secondary | ICD-10-CM | POA: Diagnosis not present

## 2016-09-19 DIAGNOSIS — I10 Essential (primary) hypertension: Secondary | ICD-10-CM | POA: Diagnosis not present

## 2016-09-24 ENCOUNTER — Other Ambulatory Visit: Payer: Self-pay | Admitting: Gastroenterology

## 2016-09-27 ENCOUNTER — Other Ambulatory Visit: Payer: Self-pay | Admitting: Family Medicine

## 2016-10-01 ENCOUNTER — Other Ambulatory Visit: Payer: Self-pay | Admitting: Family Medicine

## 2016-10-07 NOTE — Discharge Instructions (Signed)
General Anesthesia, Adult, Care After °These instructions provide you with information about caring for yourself after your procedure. Your health care provider may also give you more specific instructions. Your treatment has been planned according to current medical practices, but problems sometimes occur. Call your health care provider if you have any problems or questions after your procedure. °What can I expect after the procedure? °After the procedure, it is common to have: °· Vomiting. °· A sore throat. °· Mental slowness. ° °It is common to feel: °· Nauseous. °· Cold or shivery. °· Sleepy. °· Tired. °· Sore or achy, even in parts of your body where you did not have surgery. ° °Follow these instructions at home: °For at least 24 hours after the procedure: °· Do not: °? Participate in activities where you could fall or become injured. °? Drive. °? Use heavy machinery. °? Drink alcohol. °? Take sleeping pills or medicines that cause drowsiness. °? Make important decisions or sign legal documents. °? Take care of children on your own. °· Rest. °Eating and drinking °· If you vomit, drink water, juice, or soup when you can drink without vomiting. °· Drink enough fluid to keep your urine clear or pale yellow. °· Make sure you have little or no nausea before eating solid foods. °· Follow the diet recommended by your health care provider. °General instructions °· Have a responsible adult stay with you until you are awake and alert. °· Return to your normal activities as told by your health care provider. Ask your health care provider what activities are safe for you. °· Take over-the-counter and prescription medicines only as told by your health care provider. °· If you smoke, do not smoke without supervision. °· Keep all follow-up visits as told by your health care provider. This is important. °Contact a health care provider if: °· You continue to have nausea or vomiting at home, and medicines are not helpful. °· You  cannot drink fluids or start eating again. °· You cannot urinate after 8-12 hours. °· You develop a skin rash. °· You have fever. °· You have increasing redness at the site of your procedure. °Get help right away if: °· You have difficulty breathing. °· You have chest pain. °· You have unexpected bleeding. °· You feel that you are having a life-threatening or urgent problem. °This information is not intended to replace advice given to you by your health care provider. Make sure you discuss any questions you have with your health care provider. °Document Released: 04/15/2000 Document Revised: 06/12/2015 Document Reviewed: 12/22/2014 °Elsevier Interactive Patient Education © 2018 [employer] ° °

## 2016-11-14 ENCOUNTER — Encounter: Admission: RE | Payer: Self-pay | Source: Ambulatory Visit

## 2016-11-14 ENCOUNTER — Ambulatory Visit: Admission: RE | Admit: 2016-11-14 | Payer: Medicare Other | Source: Ambulatory Visit | Admitting: Gastroenterology

## 2016-11-14 SURGERY — COLONOSCOPY WITH PROPOFOL
Anesthesia: Choice

## 2016-11-27 DIAGNOSIS — N8189 Other female genital prolapse: Secondary | ICD-10-CM | POA: Diagnosis not present

## 2016-11-28 ENCOUNTER — Other Ambulatory Visit: Payer: Self-pay | Admitting: Family Medicine

## 2016-11-29 ENCOUNTER — Other Ambulatory Visit: Payer: Self-pay | Admitting: Obstetrics and Gynecology

## 2016-11-29 DIAGNOSIS — N8189 Other female genital prolapse: Secondary | ICD-10-CM

## 2016-12-03 ENCOUNTER — Other Ambulatory Visit: Payer: Self-pay

## 2016-12-03 DIAGNOSIS — M62838 Other muscle spasm: Secondary | ICD-10-CM

## 2016-12-03 MED ORDER — CYCLOBENZAPRINE HCL 5 MG PO TABS: 5.0000 mg | ORAL_TABLET | Freq: Three times a day (TID) | ORAL | 1 refills | Status: DC | PRN

## 2016-12-07 ENCOUNTER — Other Ambulatory Visit: Payer: Self-pay | Admitting: Family Medicine

## 2016-12-07 DIAGNOSIS — F339 Major depressive disorder, recurrent, unspecified: Secondary | ICD-10-CM

## 2016-12-07 DIAGNOSIS — F32A Depression, unspecified: Secondary | ICD-10-CM

## 2016-12-07 DIAGNOSIS — F329 Major depressive disorder, single episode, unspecified: Secondary | ICD-10-CM

## 2016-12-31 ENCOUNTER — Other Ambulatory Visit: Payer: Self-pay | Admitting: Family Medicine

## 2017-01-09 DIAGNOSIS — E782 Mixed hyperlipidemia: Secondary | ICD-10-CM | POA: Diagnosis not present

## 2017-01-09 DIAGNOSIS — I480 Paroxysmal atrial fibrillation: Secondary | ICD-10-CM | POA: Diagnosis not present

## 2017-01-09 DIAGNOSIS — I10 Essential (primary) hypertension: Secondary | ICD-10-CM | POA: Diagnosis not present

## 2017-01-27 ENCOUNTER — Other Ambulatory Visit: Payer: Self-pay

## 2017-01-27 MED ORDER — OMEPRAZOLE 40 MG PO CPDR: 40.0000 mg | DELAYED_RELEASE_CAPSULE | Freq: Two times a day (BID) | ORAL | 0 refills | Status: DC

## 2017-01-27 MED ORDER — METOPROLOL SUCCINATE ER 100 MG PO TB24: 100.0000 mg | ORAL_TABLET | Freq: Every day | ORAL | 0 refills | Status: DC

## 2017-01-28 ENCOUNTER — Ambulatory Visit: Payer: Medicare Other | Admitting: Family Medicine

## 2017-02-04 ENCOUNTER — Ambulatory Visit
Admission: RE | Admit: 2017-02-04 | Discharge: 2017-02-04 | Disposition: A | Discharge status: Home / Self Care | Payer: Medicare Other | Source: Ambulatory Visit | Attending: Family Medicine | Admitting: Family Medicine

## 2017-02-04 ENCOUNTER — Ambulatory Visit (INDEPENDENT_AMBULATORY_CARE_PROVIDER_SITE_OTHER): Payer: Medicare Other | Admitting: Family Medicine

## 2017-02-04 ENCOUNTER — Ambulatory Visit: Payer: Medicare Other | Admitting: Family Medicine

## 2017-02-04 ENCOUNTER — Encounter: Payer: Self-pay | Admitting: Family Medicine

## 2017-02-04 VITALS — BP 122/90 | HR 72 | Ht 65.0 in | Wt 238.0 lb

## 2017-02-04 DIAGNOSIS — M545 Low back pain, unspecified: Secondary | ICD-10-CM

## 2017-02-04 DIAGNOSIS — F329 Major depressive disorder, single episode, unspecified: Secondary | ICD-10-CM | POA: Diagnosis not present

## 2017-02-04 DIAGNOSIS — M542 Cervicalgia: Secondary | ICD-10-CM

## 2017-02-04 DIAGNOSIS — M546 Pain in thoracic spine: Secondary | ICD-10-CM | POA: Diagnosis not present

## 2017-02-04 DIAGNOSIS — K219 Gastro-esophageal reflux disease without esophagitis: Secondary | ICD-10-CM

## 2017-02-04 DIAGNOSIS — M25562 Pain in left knee: Secondary | ICD-10-CM | POA: Diagnosis not present

## 2017-02-04 DIAGNOSIS — M5030 Other cervical disc degeneration, unspecified cervical region: Secondary | ICD-10-CM | POA: Diagnosis not present

## 2017-02-04 DIAGNOSIS — M25552 Pain in left hip: Principal | ICD-10-CM

## 2017-02-04 DIAGNOSIS — M25551 Pain in right hip: Secondary | ICD-10-CM

## 2017-02-04 DIAGNOSIS — M461 Sacroiliitis, not elsewhere classified: Secondary | ICD-10-CM | POA: Diagnosis not present

## 2017-02-04 DIAGNOSIS — F32A Depression, unspecified: Secondary | ICD-10-CM

## 2017-02-04 DIAGNOSIS — F339 Major depressive disorder, recurrent, unspecified: Secondary | ICD-10-CM

## 2017-02-04 DIAGNOSIS — E785 Hyperlipidemia, unspecified: Secondary | ICD-10-CM

## 2017-02-04 DIAGNOSIS — G8929 Other chronic pain: Secondary | ICD-10-CM

## 2017-02-04 DIAGNOSIS — I420 Dilated cardiomyopathy: Secondary | ICD-10-CM

## 2017-02-04 DIAGNOSIS — M62838 Other muscle spasm: Secondary | ICD-10-CM | POA: Diagnosis not present

## 2017-02-04 DIAGNOSIS — I10 Essential (primary) hypertension: Secondary | ICD-10-CM

## 2017-02-04 DIAGNOSIS — M4184 Other forms of scoliosis, thoracic region: Secondary | ICD-10-CM | POA: Diagnosis not present

## 2017-02-04 DIAGNOSIS — M47814 Spondylosis without myelopathy or radiculopathy, thoracic region: Secondary | ICD-10-CM | POA: Diagnosis not present

## 2017-02-04 DIAGNOSIS — M40294 Other kyphosis, thoracic region: Secondary | ICD-10-CM | POA: Diagnosis not present

## 2017-02-04 DIAGNOSIS — M5136 Other intervertebral disc degeneration, lumbar region: Secondary | ICD-10-CM | POA: Diagnosis not present

## 2017-02-04 DIAGNOSIS — I4891 Unspecified atrial fibrillation: Secondary | ICD-10-CM

## 2017-02-04 DIAGNOSIS — M4802 Spinal stenosis, cervical region: Secondary | ICD-10-CM | POA: Diagnosis not present

## 2017-02-04 MED ORDER — SERTRALINE HCL 100 MG PO TABS: 100.0000 mg | ORAL_TABLET | Freq: Every day | ORAL | 6 refills | Status: DC

## 2017-02-04 MED ORDER — METOPROLOL SUCCINATE ER 100 MG PO TB24: 100.0000 mg | ORAL_TABLET | Freq: Every day | ORAL | 6 refills | Status: DC

## 2017-02-04 MED ORDER — HYDROCHLOROTHIAZIDE 12.5 MG PO CAPS: 12.5000 mg | ORAL_CAPSULE | Freq: Every day | ORAL | 6 refills | Status: DC

## 2017-02-04 MED ORDER — CYCLOBENZAPRINE HCL 5 MG PO TABS: 5.0000 mg | ORAL_TABLET | Freq: Three times a day (TID) | ORAL | 6 refills | Status: DC | PRN

## 2017-02-04 MED ORDER — PRAVASTATIN SODIUM 20 MG PO TABS: 20.0000 mg | ORAL_TABLET | Freq: Every day | ORAL | 6 refills | Status: DC

## 2017-02-04 MED ORDER — OMEGA-3 1000 MG PO CAPS: 1.0000 | ORAL_CAPSULE | Freq: Every day | ORAL | 3 refills | Status: DC

## 2017-02-04 MED ORDER — OMEPRAZOLE 40 MG PO CPDR: 40.0000 mg | DELAYED_RELEASE_CAPSULE | Freq: Two times a day (BID) | ORAL | 11 refills | Status: DC

## 2017-02-04 NOTE — Progress Notes (Signed)
Name: Melissa Chase   MRN: [id number]    DOB: May 05, 1940   Date:02/04/2017       Progress Note  Subjective  Chief Complaint  Chief Complaint  Patient presents with  . Hypertension  . Hyperlipidemia  . Gastroesophageal Reflux  . Depression    Hypertension  This is a chronic problem. The current episode started more than 1 year ago. The problem has been waxing and waning since onset. The problem is controlled. Associated symptoms include neck pain. Pertinent negatives include no anxiety, blurred vision, chest pain, headaches, malaise/fatigue, orthopnea, palpitations, peripheral edema, PND, shortness of breath or sweats. There are no associated agents to hypertension. Past treatments include beta blockers and diuretics. The current treatment provides moderate improvement. There are no compliance problems.  There is no history of angina, kidney disease, CAD/MI, CVA, heart failure, left ventricular hypertrophy, PVD or retinopathy. There is no history of chronic renal disease, a hypertension causing med or renovascular disease.  Hyperlipidemia  This is a chronic problem. The current episode started more than 1 year ago. The problem is controlled. Recent lipid tests were reviewed and are normal. She has no history of chronic renal disease. Pertinent negatives include no chest pain, focal weakness, leg pain, myalgias or shortness of breath. Current antihyperlipidemic treatment includes statins. The current treatment provides moderate improvement of lipids. There are no compliance problems.  There are no known risk factors for coronary artery disease.  Gastroesophageal Reflux  She reports no abdominal pain, no belching, no chest pain, no choking, no coughing, no dysphagia, no heartburn, no hoarse voice, no nausea, no sore throat or no wheezing. This is a chronic problem. The current episode started more than 1 year ago. The problem has been waxing and waning. The symptoms are aggravated by certain  foods. Pertinent negatives include no fatigue, melena or weight loss. She has tried a PPI (prilosec) for the symptoms. The treatment provided moderate relief.  Depression         This is a chronic problem.  The current episode started more than 1 year ago.   The onset quality is sudden.   The problem has been gradually improving since onset.  Associated symptoms include no decreased concentration, no fatigue, no helplessness, no hopelessness, does not have insomnia, not irritable, no restlessness, no decreased interest, no appetite change, no body aches, no myalgias, no headaches, no indigestion, not sad and no suicidal ideas.     The symptoms are aggravated by medication.  Past treatments include SSRIs - Selective serotonin reuptake inhibitors.  Compliance with treatment is good.  Previous treatment provided mild relief.   Pertinent negatives include no anxiety. Neck Pain   This is a chronic problem. The current episode started more than 1 year ago. The problem occurs constantly. The problem has been gradually worsening. The quality of the pain is described as aching. The pain is at a severity of 8/10. The pain is moderate. The symptoms are aggravated by bending and twisting. Pertinent negatives include no chest pain, fever, headaches, leg pain, numbness, paresis, tingling or weight loss.  Back Pain  This is a chronic problem. The current episode started more than 1 year ago. The problem occurs constantly. The problem has been gradually worsening since onset. The pain is present in the lumbar spine, sacro-iliac and thoracic spine. The quality of the pain is described as aching. The pain does not radiate. The pain is at a severity of 8/10. The pain is moderate. The pain is the  same all the time. Pertinent negatives include no abdominal pain, bladder incontinence, bowel incontinence, chest pain, dysuria, fever, headaches, leg pain, numbness, paresis, tingling or weight loss.  Hip Pain   The incident occurred  more than 1 week ago. There was no injury mechanism. The quality of the pain is described as aching. The pain is at a severity of 8/10. Pertinent negatives include no inability to bear weight, loss of motion, numbness or tingling. She has tried NSAIDs (celebrex/ cymbalta) for the symptoms.    No problem-specific Assessment & Plan notes found for this encounter.   Past Medical History:  Diagnosis Date  . Depression   . GERD (gastroesophageal reflux disease)   . Hypertension   . Insomnia   . Insomnia     Past Surgical History:  Procedure Laterality Date  . CARDIOVERSION N/A 03/21/2016   Procedure: Cardioversion;  Surgeon: Corey Skains, MD;  Location: [facility] ORS;  Service: Cardiovascular;  Laterality: N/A;  . LAPAROSCOPIC CHOLECYSTECTOMY    . TONSILLECTOMY    . VAGINAL HYSTERECTOMY      Family History  Problem Relation Age of Onset  . Diabetes Sister     Social History   Socioeconomic History  . Marital status: Married    Spouse name: Not on file  . Number of children: Not on file  . Years of education: Not on file  . Highest education level: Not on file  Social Needs  . Financial resource strain: Not on file  . Food insecurity - worry: Not on file  . Food insecurity - inability: Not on file  . Transportation needs - medical: Not on file  . Transportation needs - non-medical: Not on file  Occupational History  . Not on file  Tobacco Use  . Smoking status: Never Smoker  . Smokeless tobacco: Never Used  Substance and Sexual Activity  . Alcohol use: Yes    Alcohol/week: 0.0 oz  . Drug use: No  . Sexual activity: No  Other Topics Concern  . Not on file  Social History Narrative  . Not on file    No Known Allergies  Outpatient Medications Prior to Visit  Medication Sig Dispense Refill  . amiodarone (PACERONE) 400 MG tablet Take 200 mg by mouth daily. Dr Nehemiah Massed    . apixaban (ELIQUIS) 5 MG TABS tablet Take 5 mg by mouth 2 (two) times daily.    . Multiple  Vitamins-Minerals (MULTIVITAMIN WITH MINERALS) tablet Take 1 tablet by mouth daily.    . hydrochlorothiazide (MICROZIDE) 12.5 MG capsule TAKE 1 CAPSULE (12.5 MG TOTAL) BY MOUTH DAILY. 30 capsule 1  . metoprolol succinate (TOPROL-XL) 100 MG 24 hr tablet Take 1 tablet (100 mg total) by mouth daily. Take with or immediately following a meal. 30 tablet 0  . Omega-3 1000 MG CAPS Take 1 capsule by mouth daily.    Marland Kitchen omeprazole (PRILOSEC) 40 MG capsule Take 1 capsule (40 mg total) by mouth 2 (two) times daily. 60 capsule 0  . pravastatin (PRAVACHOL) 20 MG tablet Take by mouth.    . sertraline (ZOLOFT) 100 MG tablet TAKE 1 TABLET BY MOUTH EVERY DAY 90 tablet 0  . celecoxib (CELEBREX) 200 MG capsule TAKE 1 CAPSULE (200 MG TOTAL) BY MOUTH DAILY. (Patient not taking: Reported on 08/28/2016) 90 capsule 0  . cyclobenzaprine (FLEXERIL) 5 MG tablet Take 1 tablet (5 mg total) 3 (three) times daily as needed by mouth for muscle spasms. (Patient not taking: Reported on 02/04/2017) 30 tablet 1  .  DULoxetine (CYMBALTA) 30 MG capsule Take 1 capsule (30 mg total) by mouth daily. (Patient not taking: Reported on 08/28/2016) 30 capsule 2   No facility-administered medications prior to visit.     Review of Systems  Constitutional: Negative for appetite change, chills, fatigue, fever, malaise/fatigue and weight loss.  HENT: Negative for ear discharge, ear pain, hoarse voice and sore throat.   Eyes: Negative for blurred vision.  Respiratory: Negative for cough, sputum production, choking, shortness of breath and wheezing.   Cardiovascular: Negative for chest pain, palpitations, orthopnea, leg swelling and PND.  Gastrointestinal: Negative for abdominal pain, blood in stool, bowel incontinence, constipation, diarrhea, dysphagia, heartburn, melena and nausea.  Genitourinary: Negative for bladder incontinence, dysuria, frequency, hematuria and urgency.  Musculoskeletal: Positive for back pain, joint pain and neck pain. Negative  for myalgias.  Skin: Negative for rash.  Neurological: Negative for dizziness, tingling, sensory change, focal weakness, numbness and headaches.  Endo/Heme/Allergies: Negative for environmental allergies and polydipsia. Does not bruise/bleed easily.  Psychiatric/Behavioral: Positive for depression. Negative for decreased concentration and suicidal ideas. The patient is not nervous/anxious and does not have insomnia.      Objective  Vitals:   02/04/17 0933  BP: 122/90  Pulse: 72  Weight: 238 lb (108 kg)  Height: 5\' 5"  (1.651 m)    Physical Exam  Constitutional: She is well-developed, well-nourished, and in no distress. She is not irritable. No distress.  HENT:  Head: Normocephalic and atraumatic.  Right Ear: External ear normal.  Left Ear: External ear normal.  Nose: Nose normal.  Mouth/Throat: Oropharynx is clear and moist.  Eyes: Conjunctivae and EOM are normal. Pupils are equal, round, and reactive to light. Right eye exhibits no discharge. Left eye exhibits no discharge.  Neck: Normal range of motion. Neck supple. No JVD present. No thyromegaly present.  Cardiovascular: Normal rate, regular rhythm, normal heart sounds and intact distal pulses. Exam reveals no gallop and no friction rub.  No murmur heard. Pulmonary/Chest: Effort normal and breath sounds normal. She has no wheezes. She has no rales.  Abdominal: Soft. Bowel sounds are normal. She exhibits no mass. There is no tenderness. There is no guarding.  Musculoskeletal: Normal range of motion. She exhibits no edema.  Lymphadenopathy:    She has no cervical adenopathy.  Neurological: She is alert. She has normal reflexes.  Skin: Skin is warm and dry. She is not diaphoretic.  Psychiatric: Mood and affect normal.  Nursing note and vitals reviewed.     Assessment & Plan  Problem List Items Addressed This Visit      Cardiovascular and Mediastinum   A-fib ([place])   Relevant Medications   hydrochlorothiazide  (MICROZIDE) 12.5 MG capsule   pravastatin (PRAVACHOL) 20 MG tablet   metoprolol succinate (TOPROL-XL) 100 MG 24 hr tablet   Dilated cardiomyopathy ([facility])   Relevant Medications   hydrochlorothiazide (MICROZIDE) 12.5 MG capsule   pravastatin (PRAVACHOL) 20 MG tablet   metoprolol succinate (TOPROL-XL) 100 MG 24 hr tablet     Musculoskeletal and Integument   Sacroiliitis ([facility]CC)   Relevant Medications   cyclobenzaprine (FLEXERIL) 5 MG tablet     Other   Recurrent major depressive episodes ([facility])   Relevant Medications   sertraline (ZOLOFT) 100 MG tablet    Other Visit Diagnoses    Dyslipidemia    -  Primary   Relevant Medications   pravastatin (PRAVACHOL) 20 MG tablet   Omega-3 1000 MG CAPS   Depression       Relevant  Medications   sertraline (ZOLOFT) 100 MG tablet   Essential hypertension       Relevant Medications   hydrochlorothiazide (MICROZIDE) 12.5 MG capsule   pravastatin (PRAVACHOL) 20 MG tablet   metoprolol succinate (TOPROL-XL) 100 MG 24 hr tablet   Night muscle spasms       Relevant Medications   cyclobenzaprine (FLEXERIL) 5 MG tablet   Gastroesophageal reflux disease without esophagitis       Relevant Medications   omeprazole (PRILOSEC) 40 MG capsule   Neck pain       Relevant Orders   DG Cervical Spine Complete   Chronic midline thoracic back pain       Relevant Medications   sertraline (ZOLOFT) 100 MG tablet   cyclobenzaprine (FLEXERIL) 5 MG tablet   Other Relevant Orders   DG Thoracic Spine W/Swimmers   Lumbar pain       Relevant Medications   cyclobenzaprine (FLEXERIL) 5 MG tablet   Other Relevant Orders   DG Lumbar Spine Complete   Pain of both hip joints       Relevant Orders   DG HIP UNILAT WITH PELVIS 2-3 VIEWS LEFT   DG HIP UNILAT WITH PELVIS 2-3 VIEWS RIGHT      Meds ordered this encounter  Medications  . sertraline (ZOLOFT) 100 MG tablet    Sig: Take 1 tablet (100 mg total) by mouth daily.    Dispense:  30 tablet    Refill:  6  .  hydrochlorothiazide (MICROZIDE) 12.5 MG capsule    Sig: Take 1 capsule (12.5 mg total) by mouth daily.    Dispense:  30 capsule    Refill:  6  . cyclobenzaprine (FLEXERIL) 5 MG tablet    Sig: Take 1 tablet (5 mg total) by mouth 3 (three) times daily as needed for muscle spasms.    Dispense:  90 tablet    Refill:  6  . pravastatin (PRAVACHOL) 20 MG tablet    Sig: Take 1 tablet (20 mg total) by mouth daily.    Dispense:  30 tablet    Refill:  6  . Omega-3 1000 MG CAPS    Sig: Take 1 capsule (1,000 mg total) by mouth daily.    Dispense:  90 capsule    Refill:  3  . metoprolol succinate (TOPROL-XL) 100 MG 24 hr tablet    Sig: Take 1 tablet (100 mg total) by mouth daily. Take with or immediately following a meal.    Dispense:  30 tablet    Refill:  6  . omeprazole (PRILOSEC) 40 MG capsule    Sig: Take 1 capsule (40 mg total) by mouth 2 (two) times daily.    Dispense:  60 capsule    Refill:  11      Dr. Mac[facility]  02/04/17

## 2017-02-05 ENCOUNTER — Other Ambulatory Visit: Payer: Self-pay

## 2017-02-05 DIAGNOSIS — M47812 Spondylosis without myelopathy or radiculopathy, cervical region: Secondary | ICD-10-CM

## 2017-02-05 DIAGNOSIS — M47816 Spondylosis without myelopathy or radiculopathy, lumbar region: Secondary | ICD-10-CM

## 2017-02-19 DIAGNOSIS — I10 Essential (primary) hypertension: Secondary | ICD-10-CM | POA: Diagnosis not present

## 2017-02-19 DIAGNOSIS — I420 Dilated cardiomyopathy: Secondary | ICD-10-CM | POA: Diagnosis not present

## 2017-02-19 DIAGNOSIS — E782 Mixed hyperlipidemia: Secondary | ICD-10-CM | POA: Diagnosis not present

## 2017-02-19 DIAGNOSIS — I480 Paroxysmal atrial fibrillation: Secondary | ICD-10-CM | POA: Diagnosis not present

## 2017-04-10 DIAGNOSIS — I10 Essential (primary) hypertension: Secondary | ICD-10-CM | POA: Diagnosis not present

## 2017-04-10 DIAGNOSIS — E782 Mixed hyperlipidemia: Secondary | ICD-10-CM | POA: Diagnosis not present

## 2017-04-10 DIAGNOSIS — I420 Dilated cardiomyopathy: Secondary | ICD-10-CM | POA: Diagnosis not present

## 2017-04-10 DIAGNOSIS — I480 Paroxysmal atrial fibrillation: Secondary | ICD-10-CM | POA: Diagnosis not present

## 2017-05-04 ENCOUNTER — Other Ambulatory Visit: Payer: Self-pay | Admitting: Family Medicine

## 2017-05-26 ENCOUNTER — Ambulatory Visit (INDEPENDENT_AMBULATORY_CARE_PROVIDER_SITE_OTHER): Payer: Medicare Other | Admitting: Family Medicine

## 2017-05-26 ENCOUNTER — Encounter: Payer: Self-pay | Admitting: Family Medicine

## 2017-05-26 VITALS — BP 120/80 | HR 80 | Ht 65.0 in | Wt 240.0 lb

## 2017-05-26 DIAGNOSIS — H6981 Other specified disorders of Eustachian tube, right ear: Secondary | ICD-10-CM | POA: Diagnosis not present

## 2017-05-26 DIAGNOSIS — Z1231 Encounter for screening mammogram for malignant neoplasm of breast: Secondary | ICD-10-CM

## 2017-05-26 DIAGNOSIS — N63 Unspecified lump in unspecified breast: Secondary | ICD-10-CM | POA: Diagnosis not present

## 2017-05-26 MED ORDER — FLUTICASONE PROPIONATE 50 MCG/ACT NA SUSP: 2.0000 | Freq: Every day | NASAL | 6 refills | Status: DC

## 2017-05-26 NOTE — Progress Notes (Signed)
Name: Melissa Chase   MRN: [id number]    DOB: 16-Mar-1940   Date:05/26/2017       Progress Note  Subjective  Chief Complaint  Chief Complaint  Patient presents with  . Ear Pain    R) ear pain- throbbing  . Breast Mass    R) breast "marble near nipple"    Patient noted a right breast nodule 8-9 months ago. Gradually noted to be firm and some tenderness.   Otalgia   There is pain in the right ear. This is a new problem. The current episode started in the past 7 days (friday). The problem has been gradually worsening. There has been no fever. The pain is at a severity of 5/10. The pain is moderate. Associated symptoms include rhinorrhea. Pertinent negatives include no abdominal pain, coughing, diarrhea, ear discharge, headaches, hearing loss, neck pain, rash, sore throat or vomiting. She has tried nothing for the symptoms. There is no history of a chronic ear infection, hearing loss or a tympanostomy tube.    No problem-specific Assessment & Plan notes found for this encounter.   Past Medical History:  Diagnosis Date  . Depression   . GERD (gastroesophageal reflux disease)   . Hypertension   . Insomnia   . Insomnia     Past Surgical History:  Procedure Laterality Date  . CARDIOVERSION N/A 03/21/2016   Procedure: Cardioversion;  Surgeon: Corey Skains, MD;  Location: [facility] ORS;  Service: Cardiovascular;  Laterality: N/A;  . LAPAROSCOPIC CHOLECYSTECTOMY    . TONSILLECTOMY    . VAGINAL HYSTERECTOMY      Family History  Problem Relation Age of Onset  . Diabetes Sister     Social History   Socioeconomic History  . Marital status: Married    Spouse name: Not on file  . Number of children: Not on file  . Years of education: Not on file  . Highest education level: Not on file  Occupational History  . Not on file  Social Needs  . Financial resource strain: Not on file  . Food insecurity:    Worry: Not on file    Inability: Not on file  . Transportation needs:   Medical: Not on file    Non-medical: Not on file  Tobacco Use  . Smoking status: Never Smoker  . Smokeless tobacco: Never Used  Substance and Sexual Activity  . Alcohol use: Yes    Alcohol/week: 0.0 oz  . Drug use: No  . Sexual activity: Never  Lifestyle  . Physical activity:    Days per week: Not on file    Minutes per session: Not on file  . Stress: Not on file  Relationships  . Social connections:    Talks on phone: Not on file    Gets together: Not on file    Attends religious service: Not on file    Active member of club or organization: Not on file    Attends meetings of clubs or organizations: Not on file    Relationship status: Not on file  . Intimate partner violence:    Fear of current or ex partner: Not on file    Emotionally abused: Not on file    Physically abused: Not on file    Forced sexual activity: Not on file  Other Topics Concern  . Not on file  Social History Narrative  . Not on file    No Known Allergies  Outpatient Medications Prior to Visit  Medication Sig Dispense Refill  .  apixaban (ELIQUIS) 5 MG TABS tablet Take 5 mg by mouth 2 (two) times daily.    . hydrochlorothiazide (MICROZIDE) 12.5 MG capsule Take 1 capsule (12.5 mg total) by mouth daily. 30 capsule 6  . metoprolol succinate (TOPROL-XL) 100 MG 24 hr tablet Take 1 tablet (100 mg total) by mouth daily. Take with or immediately following a meal. 30 tablet 6  . Multiple Vitamins-Minerals (MULTIVITAMIN WITH MINERALS) tablet Take 1 tablet by mouth daily.    . Omega-3 1000 MG CAPS Take 1 capsule (1,000 mg total) by mouth daily. 90 capsule 3  . omeprazole (PRILOSEC) 40 MG capsule Take 1 capsule (40 mg total) by mouth 2 (two) times daily. 60 capsule 11  . pravastatin (PRAVACHOL) 20 MG tablet Take 1 tablet (20 mg total) by mouth daily. 30 tablet 6  . sertraline (ZOLOFT) 100 MG tablet Take 1 tablet (100 mg total) by mouth daily. 30 tablet 6  . cyclobenzaprine (FLEXERIL) 5 MG tablet Take 1 tablet (5  mg total) by mouth 3 (three) times daily as needed for muscle spasms. (Patient not taking: Reported on 05/26/2017) 90 tablet 6  . amiodarone (PACERONE) 400 MG tablet Take 200 mg by mouth daily. Dr Nehemiah Massed    . omeprazole (PRILOSEC) 40 MG capsule TAKE 1 CAPSULE BY MOUTH EVERY DAY 30 capsule 1   No facility-administered medications prior to visit.     Review of Systems  Constitutional: Negative for chills, fever, malaise/fatigue and weight loss.  HENT: Positive for ear pain and rhinorrhea. Negative for ear discharge, hearing loss and sore throat.   Eyes: Negative for blurred vision.  Respiratory: Negative for cough, sputum production, shortness of breath and wheezing.   Cardiovascular: Negative for chest pain, palpitations and leg swelling.  Gastrointestinal: Negative for abdominal pain, blood in stool, constipation, diarrhea, heartburn, melena, nausea and vomiting.  Genitourinary: Negative for dysuria, frequency, hematuria and urgency.  Musculoskeletal: Negative for back pain, joint pain, myalgias and neck pain.  Skin: Negative for rash.  Neurological: Negative for dizziness, tingling, sensory change, focal weakness and headaches.  Endo/Heme/Allergies: Negative for environmental allergies and polydipsia. Does not bruise/bleed easily.  Psychiatric/Behavioral: Negative for depression and suicidal ideas. The patient is not nervous/anxious and does not have insomnia.      Objective  Vitals:   05/26/17 1536  BP: 120/80  Pulse: 80  Weight: 240 lb (108.9 kg)  Height: 5\' 5"  (1.651 m)    Physical Exam  Constitutional: She is oriented to person, place, and time. She appears well-developed and well-nourished.  HENT:  Head: Normocephalic.  Right Ear: Hearing, external ear and ear canal normal. Tympanic membrane is retracted. No middle ear effusion.  Left Ear: Hearing, tympanic membrane, external ear and ear canal normal.  Mouth/Throat: Oropharynx is clear and moist.  Eyes: Pupils are equal,  round, and reactive to light. Conjunctivae and EOM are normal. Lids are everted and swept, no foreign bodies found. Left eye exhibits no hordeolum. No foreign body present in the left eye. Right conjunctiva is not injected. Left conjunctiva is not injected. No scleral icterus.  Neck: Normal range of motion. Neck supple. No JVD present. No tracheal deviation present. No thyromegaly present.  Cardiovascular: Normal rate, regular rhythm, normal heart sounds and intact distal pulses. Exam reveals no gallop and no friction rub.  No murmur heard. Pulmonary/Chest: Effort normal and breath sounds normal. No respiratory distress. She has no wheezes. She has no rales. Right breast exhibits mass and tenderness. Right breast exhibits no inverted nipple, no  nipple discharge and no skin change. Left breast exhibits no inverted nipple, no mass, no nipple discharge, no skin change and no tenderness. No breast swelling, tenderness or discharge.    Abdominal: Soft. Bowel sounds are normal. She exhibits no mass. There is no hepatosplenomegaly. There is no tenderness. There is no rebound and no guarding.  Musculoskeletal: Normal range of motion. She exhibits no edema or tenderness.  Lymphadenopathy:    She has no cervical adenopathy.  Neurological: She is alert and oriented to person, place, and time. She has normal strength. She displays normal reflexes. No cranial nerve deficit.  Skin: Skin is warm. No rash noted.  Psychiatric: She has a normal mood and affect. Her mood appears not anxious. She does not exhibit a depressed mood.  Nursing note and vitals reviewed.     Assessment & Plan  Problem List Items Addressed This Visit    None    Visit Diagnoses    Dysfunction of right eustachian tube    -  Primary   Relevant Medications   fluticasone (FLONASE) 50 MCG/ACT nasal spray   Breast mass in female       Relevant Orders   US BREAST LTD UNI RIGHT INC AXILLA   MM Digital Screening   Breast cancer screening  by mammogram       Relevant Orders   MM Digital Screening      Meds ordered this encounter  Medications  . fluticasone (FLONASE) 50 MCG/ACT nasal spray    Sig: Place 2 sprays into both nostrils daily.    Dispense:  16 g    Refill:  6      Dr. Otilio Miu [facility]  05/26/17

## 2017-05-28 ENCOUNTER — Other Ambulatory Visit: Payer: Self-pay

## 2017-05-28 ENCOUNTER — Other Ambulatory Visit: Payer: Self-pay | Admitting: Family Medicine

## 2017-05-28 DIAGNOSIS — F339 Major depressive disorder, recurrent, unspecified: Secondary | ICD-10-CM

## 2017-05-28 DIAGNOSIS — F32A Depression, unspecified: Secondary | ICD-10-CM

## 2017-05-28 DIAGNOSIS — F329 Major depressive disorder, single episode, unspecified: Secondary | ICD-10-CM

## 2017-06-04 ENCOUNTER — Other Ambulatory Visit: Payer: Self-pay | Admitting: Family Medicine

## 2017-06-04 DIAGNOSIS — N63 Unspecified lump in unspecified breast: Secondary | ICD-10-CM

## 2017-06-04 DIAGNOSIS — Z853 Personal history of malignant neoplasm of breast: Secondary | ICD-10-CM

## 2017-06-13 ENCOUNTER — Ambulatory Visit
Admission: RE | Admit: 2017-06-13 | Discharge: 2017-06-13 | Disposition: A | Discharge status: Home / Self Care | Payer: Medicare Other | Source: Ambulatory Visit | Attending: Family Medicine | Admitting: Family Medicine

## 2017-06-13 DIAGNOSIS — N63 Unspecified lump in unspecified breast: Secondary | ICD-10-CM | POA: Diagnosis not present

## 2017-06-13 DIAGNOSIS — Z853 Personal history of malignant neoplasm of breast: Secondary | ICD-10-CM | POA: Insufficient documentation

## 2017-06-13 DIAGNOSIS — R922 Inconclusive mammogram: Secondary | ICD-10-CM | POA: Diagnosis not present

## 2017-06-13 DIAGNOSIS — N6312 Unspecified lump in the right breast, upper inner quadrant: Secondary | ICD-10-CM | POA: Diagnosis not present

## 2017-06-13 DIAGNOSIS — N6311 Unspecified lump in the right breast, upper outer quadrant: Secondary | ICD-10-CM | POA: Diagnosis not present

## 2017-06-13 HISTORY — DX: Personal history of irradiation: Z92.3

## 2017-06-13 HISTORY — DX: Malignant neoplasm of unspecified site of unspecified female breast: C50.919

## 2017-06-17 ENCOUNTER — Other Ambulatory Visit: Payer: Self-pay | Admitting: Family Medicine

## 2017-06-17 DIAGNOSIS — N6310 Unspecified lump in the right breast, unspecified quadrant: Secondary | ICD-10-CM

## 2017-06-17 DIAGNOSIS — R928 Other abnormal and inconclusive findings on diagnostic imaging of breast: Secondary | ICD-10-CM

## 2017-06-25 ENCOUNTER — Ambulatory Visit
Admission: RE | Admit: 2017-06-25 | Discharge: 2017-06-25 | Disposition: A | Discharge status: Home / Self Care | Payer: Medicare Other | Source: Ambulatory Visit | Attending: Family Medicine | Admitting: Family Medicine

## 2017-06-25 DIAGNOSIS — N6310 Unspecified lump in the right breast, unspecified quadrant: Secondary | ICD-10-CM | POA: Diagnosis not present

## 2017-06-25 DIAGNOSIS — R928 Other abnormal and inconclusive findings on diagnostic imaging of breast: Secondary | ICD-10-CM | POA: Insufficient documentation

## 2017-06-25 DIAGNOSIS — N6341 Unspecified lump in right breast, subareolar: Secondary | ICD-10-CM | POA: Diagnosis not present

## 2017-06-25 DIAGNOSIS — D4861 Neoplasm of uncertain behavior of right breast: Secondary | ICD-10-CM | POA: Diagnosis not present

## 2017-06-25 HISTORY — PX: BREAST BIOPSY: SHX20

## 2017-06-26 LAB — SURGICAL PATHOLOGY

## 2017-06-30 ENCOUNTER — Ambulatory Visit: Payer: Self-pay | Admitting: General Surgery

## 2017-06-30 ENCOUNTER — Other Ambulatory Visit: Payer: Self-pay | Admitting: General Surgery

## 2017-06-30 DIAGNOSIS — N6091 Unspecified benign mammary dysplasia of right breast: Secondary | ICD-10-CM | POA: Diagnosis not present

## 2017-06-30 DIAGNOSIS — Z01818 Encounter for other preprocedural examination: Secondary | ICD-10-CM | POA: Diagnosis not present

## 2017-06-30 DIAGNOSIS — Z7901 Long term (current) use of anticoagulants: Secondary | ICD-10-CM | POA: Diagnosis not present

## 2017-06-30 DIAGNOSIS — R945 Abnormal results of liver function studies: Secondary | ICD-10-CM | POA: Diagnosis not present

## 2017-06-30 NOTE — H&P (Signed)
PATIENT PROFILE: Melissa Chase is a 77 y.o. female who presents to the Clinic for consultation at the request of Dr. Ronnald Ramp for evaluation of atypical breast lesion.  PCP:  Armando Reichert, MD  HISTORY OF PRESENT ILLNESS: Melissa Chase reports she felt a node on the right subareolar breast around 9 months ago. Patient had a diagnostic mammogram and ultrasound on 06/13/17 and was found with a right breast solid mass at 1230 that measured around 1.1 x 0.9 x 0.5 cm with internal vascularity. No axillary lymphadenopathy seen. The mass was biopsied using ultrasound guided core biopsy. 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 Excision was recommended.   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   Family history of breast cancer: none.  Family history of other cancers: none History of Radiation to the chest: Left breast radiation.   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 Positive for weight gain.  Allergy and Immunology ROS: negative for - hives  Hematological and Lymphatic ROS: negative for - bleeding problems or bruising, negative for palpable nodes Endocrine ROS: negative for - heat or cold intolerance, hair changes Respiratory ROS: negative for - cough, shortness of breath or wheezing Cardiovascular ROS: no chest pain or palpitations GI ROS: negative for nausea, vomiting, abdominal pain, diarrhea, constipation Musculoskeletal ROS: negative for - joint swelling or  muscle pain Neurological ROS: negative for - confusion, syncope Dermatological ROS: negative for pruritus and rash Psychiatric: negative for anxiety, depression, difficulty sleeping and memory loss  MEDICATIONS: CurrentMedications        Current Outpatient Medications  Medication Sig Dispense Refill  . acetaminophen (TYLENOL) 500 MG tablet Take 1,000 mg by mouth 2 (two) times daily Patient takes 500 mg in the am and 1,000 mg in the pm    . apixaban (ELIQUIS) 5 mg tablet Take 1 tablet (5 mg total) by mouth every 12 (twelve) hours 60 tablet 5  . estradiol (ESTRACE) 0.01 % (0.1 mg/gram) vaginal cream Insert 0.5mg  every day x 2 weeks then every other day x 2 weeks then 2-3 times weekly for maintenance (Patient taking differently: Place 0.5 g vaginally every 14 (fourteen) days Insert 0.5mg  every day x 2 weeks then every other day x 2 weeks then 2-3 times weekly for maintenanc ) 42.5 g 3  . hydroCHLOROthiazide (MICROZIDE) 12.5 mg capsule TAKE 1 CAPSULE BY MOUTH EVERY DAY 30 capsule 0  . melatonin 10 mg Tab Take 1 tablet by mouth nightly    . metoprolol succinate (TOPROL-XL) 100 MG XL tablet Take 100 mg by mouth once daily.      . multivitamin with minerals tablet Take by mouth    . nystatin (MYCOSTATIN) 100,000 unit/mL suspension Swish and swallow 500,000 Units 4 (four) times daily as needed      . omega-3 acid ethyl esters (LOVAZA) 1 gram capsule Take by mouth    . omeprazole (PRILOSEC) 40 MG DR capsule Take 40 mg by  mouth once daily  6  . pravastatin (PRAVACHOL) 20 MG tablet Take 1 tablet (20 mg total) by mouth nightly 30 tablet 11  . sertraline (ZOLOFT) 100 MG tablet Take 100 mg by mouth once daily.      Marland Kitchen sulfacetamide (BLEPH-10) 10 % ophthalmic solution PLACE 1 DROP INTO BOTH EYES EVERY 3 (THREE) HOURS.  0   No current facility-administered medications for this visit.       ALLERGIES: Patient has no known allergies.  PAST MEDICAL HISTORY:     Past Medical  History:  Diagnosis Date  . Atrial fibrillation , unspecified (CMS-HCC)   . Breast cancer , unspecified (CMS-HCC)   . Hyperlipidemia   . Hypertension     PAST SURGICAL HISTORY:      Past Surgical History:  Procedure Laterality Date  . CHOLECYSTECTOMY    . 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 EGD  11/04/2003   No repeat per RTE     FAMILY HISTORY:      Family History  Problem Relation Age of Onset  . Coronary Artery Disease (Blocked arteries around heart) Mother      SOCIAL HISTORY: Social History          Socioeconomic History  . Marital status: Widowed    Spouse name: Not on file  . Number of children: Not on file  . Years of education: Not on file  . Highest education level: Not on file  Occupational History  . Not on file  Social Needs  . Financial resource strain: Not on file  . Food insecurity:    Worry: Not on file    Inability: Not on file  . Transportation needs:    Medical: Not on file    Non-medical: Not on file  Tobacco Use  . Smoking status: Never Smoker  . Smokeless tobacco: Never Used  Substance and Sexual Activity  . Alcohol use: Yes    Alcohol/week: 1.8 oz    Types: 3 Shots of liquor per week    Comment: 3 a month  . Drug use: No  . Sexual activity: Never  Other Topics Concern  . Not on file  Social History Narrative  . Not on file      PHYSICAL EXAM: There were no vitals filed for this visit. Body mass index is 39.27 kg/m. Weight: (!) 107 kg (236 lb)   GENERAL: Alert, active, oriented x3  HEENT: Pupils equal reactive to light. Extraocular movements are intact. Sclera clear. Palpebral conjunctiva normal red color.Pharynx clear.  NECK: Supple with no palpable mass and no adenopathy.  LUNGS: Sound clear with no rales rhonchi or wheezes.  HEART: Regular rhythm S1 and S2 without murmur.  BREAST: abnormal  mass palpable upper portion of the areola with associated bruising. Left breasts appear normal, no suspicious masses, no skin or nipple changes or axillary nodes, Superior scar healed.  ABDOMEN: Soft and depressible, nontender with no palpable mass, no hepatomegaly.  EXTREMITIES: Well-developed well-nourished symmetrical with no dependent edema.  NEUROLOGICAL: Awake alert oriented, facial expression symmetrical, moving all extremities.  REVIEW OF DATA: I have reviewed the following data today:      No visits with results within 3 Month(s) from this visit.  Latest known visit with results is:  Appointment on 02/19/2017  Component Date Value  . LV Ejection Fraction (%) 02/19/2017 45   . Aortic Valve Regurgitati* 02/19/2017 mild   . Aortic Valve Stenosis  Gr* 02/19/2017 none   . Aortic Valve Max Velocit* 02/19/2017 1.7   . Aortic Valve Stenosis Me* 02/19/2017 5.3   . Mitral Valve Regurgitati* 02/19/2017 moderate   . Mitral Valve Stenosis Gr* 02/19/2017 none   . Tricuspid Valve Regurgit* 02/19/2017 moderate   . Tricuspid Valve Regurgit* 02/19/2017 3   . Right Ventricle Systolic* 22/29/7989 21.1   . LV End Diastolic Diamete* 94/17/4081 6.1   . LV End Systolic Diameter* 44/81/8563 4.9   . LV Septum Wall Thickness* 02/19/2017 0.73   . LV Posterior Wall Thickn* 02/19/2017 0.79   . Left Atrium Diameter (cm) 02/19/2017 4.1     Diagnostic mammogram and ultrasound images personally reviewed  Biopsy pathology report reviewed and discussed personally with patient and daughter.   ASSESSMENT: Ms. Fong is a 77 y.o. 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    Patient was oriented again about the pathology results. Surgical alternatives were discussed with patient including needle excisional biopsy (lumpectomy). Surgical technique and post operative care was discussed with patient. Risk of surgery was discussed with patient including but not  limited to: wound infection, seroma, hematoma, brachial plexopathy, mondor's disease (thrombosis of small veins of breast), chronic wound pain, breast lymphedema, altered sensation to the nipple and cosmesis among others. Also patient and daughter oriented that if there are positive margins in a new diagnosis of breast cancer, re excision will be considered. Also discussed that if invasive carcinoma is identified will need axillary lymph node evaluation for excision and staging.   PLAN: 1. Needle guided excisional open biopsy (19125) 2. CBC, CMP, PT/NR, PTT 3. Cardiology Clearance 4. Hold Eliquis 48 hours before surgery 5. Contact us if has any question or concern.   Patient and daughter verbalized understanding, all questions were answered, and were agreeable with the plan outlined above.   Encounter duration was 60 minutes more that 50 % of the time on counseling and orientation about diagnosis, treatment and coordination plan of care.   Herbert Pun, MD  Electronically signed by Herbert Pun, MD

## 2017-06-30 NOTE — H&P (View-Only) (Signed)
PATIENT PROFILE: Melissa Chase is a 77 y.o. female who presents to the Clinic for consultation at the request of Dr. Ronnald Ramp for evaluation of atypical breast lesion.  PCP:  Armando Reichert, MD  HISTORY OF PRESENT ILLNESS: [name] reports she felt a node on the right subareolar breast around 9 months ago. Patient had a diagnostic mammogram and ultrasound on 06/13/17 and was found with a right breast solid mass at 1230 that measured around 1.1 x 0.9 x 0.5 cm with internal vascularity. No axillary lymphadenopathy seen. The mass was biopsied using ultrasound guided core biopsy. 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 Excision was recommended.   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   Family history of breast cancer: none.  Family history of other cancers: none History of Radiation to the chest: Left breast radiation.   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 Positive for weight gain.  Allergy and Immunology ROS: negative for - hives  Hematological and Lymphatic ROS: negative for - bleeding problems or bruising, negative for palpable nodes Endocrine ROS: negative for - heat or cold intolerance, hair changes Respiratory ROS: negative for - cough, shortness of breath or wheezing Cardiovascular ROS: no chest pain or palpitations GI ROS: negative for nausea, vomiting, abdominal pain, diarrhea, constipation Musculoskeletal ROS: negative for - joint swelling or  muscle pain Neurological ROS: negative for - confusion, syncope Dermatological ROS: negative for pruritus and rash Psychiatric: negative for anxiety, depression, difficulty sleeping and memory loss  MEDICATIONS: CurrentMedications        Current Outpatient Medications  Medication Sig Dispense Refill  . acetaminophen (TYLENOL) 500 MG tablet Take 1,000 mg by mouth 2 (two) times daily Patient takes 500 mg in the am and 1,000 mg in the pm    . apixaban (ELIQUIS) 5 mg tablet Take 1 tablet (5 mg total) by mouth every 12 (twelve) hours 60 tablet 5  . estradiol (ESTRACE) 0.01 % (0.1 mg/gram) vaginal cream Insert 0.5mg  every day x 2 weeks then every other day x 2 weeks then 2-3 times weekly for maintenance (Patient taking differently: Place 0.5 g vaginally every 14 (fourteen) days Insert 0.5mg  every day x 2 weeks then every other day x 2 weeks then 2-3 times weekly for maintenanc ) 42.5 g 3  . hydroCHLOROthiazide (MICROZIDE) 12.5 mg capsule TAKE 1 CAPSULE BY MOUTH EVERY DAY 30 capsule 0  . melatonin 10 mg Tab Take 1 tablet by mouth nightly    . metoprolol succinate (TOPROL-XL) 100 MG XL tablet Take 100 mg by mouth once daily.      . multivitamin with minerals tablet Take by mouth    . nystatin (MYCOSTATIN) 100,000 unit/mL suspension Swish and swallow 500,000 Units 4 (four) times daily as needed      . omega-3 acid ethyl esters (LOVAZA) 1 gram capsule Take by mouth    . omeprazole (PRILOSEC) 40 MG DR capsule Take 40 mg by  mouth once daily  6  . pravastatin (PRAVACHOL) 20 MG tablet Take 1 tablet (20 mg total) by mouth nightly 30 tablet 11  . sertraline (ZOLOFT) 100 MG tablet Take 100 mg by mouth once daily.      Marland Kitchen sulfacetamide (BLEPH-10) 10 % ophthalmic solution PLACE 1 DROP INTO BOTH EYES EVERY 3 (THREE) HOURS.  0   No current facility-administered medications for this visit.       ALLERGIES: Patient has no known allergies.  PAST MEDICAL HISTORY:     Past Medical  History:  Diagnosis Date  . Atrial fibrillation , unspecified (CMS-HCC)   . Breast cancer , unspecified (CMS-HCC)   . Hyperlipidemia   . Hypertension     PAST SURGICAL HISTORY:      Past Surgical History:  Procedure Laterality Date  . CHOLECYSTECTOMY    . 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 EGD  11/04/2003   No repeat per RTE     FAMILY HISTORY:      Family History  Problem Relation Age of Onset  . Coronary Artery Disease (Blocked arteries around heart) Mother      SOCIAL HISTORY: Social History          Socioeconomic History  . Marital status: Widowed    Spouse name: Not on file  . Number of children: Not on file  . Years of education: Not on file  . Highest education level: Not on file  Occupational History  . Not on file  Social Needs  . Financial resource strain: Not on file  . Food insecurity:    Worry: Not on file    Inability: Not on file  . Transportation needs:    Medical: Not on file    Non-medical: Not on file  Tobacco Use  . Smoking status: Never Smoker  . Smokeless tobacco: Never Used  Substance and Sexual Activity  . Alcohol use: Yes    Alcohol/week: 1.8 oz    Types: 3 Shots of liquor per week    Comment: 3 a month  . Drug use: No  . Sexual activity: Never  Other Topics Concern  . Not on file  Social History Narrative  . Not on file      PHYSICAL EXAM: There were no vitals filed for this visit. Body mass index is 39.27 kg/m. Weight: (!) 107 kg (236 lb)   GENERAL: Alert, active, oriented x3  HEENT: Pupils equal reactive to light. Extraocular movements are intact. Sclera clear. Palpebral conjunctiva normal red color.Pharynx clear.  NECK: Supple with no palpable mass and no adenopathy.  LUNGS: Sound clear with no rales rhonchi or wheezes.  HEART: Regular rhythm S1 and S2 without murmur.  BREAST: abnormal  mass palpable upper portion of the areola with associated bruising. Left breasts appear normal, no suspicious masses, no skin or nipple changes or axillary nodes, Superior scar healed.  ABDOMEN: Soft and depressible, nontender with no palpable mass, no hepatomegaly.  EXTREMITIES: Well-developed well-nourished symmetrical with no dependent edema.  NEUROLOGICAL: Awake alert oriented, facial expression symmetrical, moving all extremities.  REVIEW OF DATA: I have reviewed the following data today:      No visits with results within 3 Month(s) from this visit.  Latest known visit with results is:  Appointment on 02/19/2017  Component Date Value  . LV Ejection Fraction (%) 02/19/2017 45   . Aortic Valve Regurgitati* 02/19/2017 mild   . Aortic Valve Stenosis  Gr* 02/19/2017 none   . Aortic Valve Max Velocit* 02/19/2017 1.7   . Aortic Valve Stenosis Me* 02/19/2017 5.3   . Mitral Valve Regurgitati* 02/19/2017 moderate   . Mitral Valve Stenosis Gr* 02/19/2017 none   . Tricuspid Valve Regurgit* 02/19/2017 moderate   . Tricuspid Valve Regurgit* 02/19/2017 3   . Right Ventricle Systolic* 76/54/6503 54.6   . LV End Diastolic Diamete* 56/81/2751 6.1   . LV End Systolic Diameter* 70/01/7492 4.9   . LV Septum Wall Thickness* 02/19/2017 0.73   . LV Posterior Wall Thickn* 02/19/2017 0.79   . Left Atrium Diameter (cm) 02/19/2017 4.1     Diagnostic mammogram and ultrasound images personally reviewed  Biopsy pathology report reviewed and discussed personally with patient and daughter.   ASSESSMENT: Ms. Tino is a 77 y.o. 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    Patient was oriented again about the pathology results. Surgical alternatives were discussed with patient including needle excisional biopsy (lumpectomy). Surgical technique and post operative care was discussed with patient. Risk of surgery was discussed with patient including but not  limited to: wound infection, seroma, hematoma, brachial plexopathy, mondor's disease (thrombosis of small veins of breast), chronic wound pain, breast lymphedema, altered sensation to the nipple and cosmesis among others. Also patient and daughter oriented that if there are positive margins in a new diagnosis of breast cancer, re excision will be considered. Also discussed that if invasive carcinoma is identified will need axillary lymph node evaluation for excision and staging.   PLAN: 1. Needle guided excisional open biopsy (19125) 2. CBC, CMP, PT/NR, PTT 3. Cardiology Clearance 4. Hold Eliquis 48 hours before surgery 5. Contact us if has any question or concern.   Patient and daughter verbalized understanding, all questions were answered, and were agreeable with the plan outlined above.   Encounter duration was 60 minutes more that 50 % of the time on counseling and orientation about diagnosis, treatment and coordination plan of care.   Herbert Pun, MD  Electronically signed by Herbert Pun, MD

## 2017-07-03 ENCOUNTER — Inpatient Hospital Stay: Admission: RE | Admit: 2017-07-03 | Payer: Medicare Other | Source: Ambulatory Visit

## 2017-07-03 ENCOUNTER — Other Ambulatory Visit: Payer: Self-pay

## 2017-07-03 ENCOUNTER — Encounter
Admission: RE | Admit: 2017-07-03 | Discharge: 2017-07-03 | Disposition: A | Discharge status: Home / Self Care | Payer: Medicare Other | Source: Ambulatory Visit | Attending: General Surgery | Admitting: General Surgery

## 2017-07-03 DIAGNOSIS — Z01818 Encounter for other preprocedural examination: Secondary | ICD-10-CM | POA: Diagnosis not present

## 2017-07-03 DIAGNOSIS — I4891 Unspecified atrial fibrillation: Secondary | ICD-10-CM | POA: Diagnosis not present

## 2017-07-03 DIAGNOSIS — I10 Essential (primary) hypertension: Secondary | ICD-10-CM | POA: Insufficient documentation

## 2017-07-03 DIAGNOSIS — Z01810 Encounter for preprocedural cardiovascular examination: Secondary | ICD-10-CM | POA: Diagnosis not present

## 2017-07-03 DIAGNOSIS — I480 Paroxysmal atrial fibrillation: Secondary | ICD-10-CM | POA: Diagnosis not present

## 2017-07-03 DIAGNOSIS — R001 Bradycardia, unspecified: Secondary | ICD-10-CM | POA: Insufficient documentation

## 2017-07-03 DIAGNOSIS — E782 Mixed hyperlipidemia: Secondary | ICD-10-CM | POA: Diagnosis not present

## 2017-07-03 DIAGNOSIS — I420 Dilated cardiomyopathy: Secondary | ICD-10-CM | POA: Diagnosis not present

## 2017-07-03 HISTORY — DX: Unspecified osteoarthritis, unspecified site: M19.90

## 2017-07-03 HISTORY — DX: Unspecified atrial fibrillation: I48.91

## 2017-07-03 NOTE — Pre-Procedure Instructions (Signed)
Copy of today's EKG sent with patient to Dr Alveria Apley office for her cardiac clearance appt

## 2017-07-03 NOTE — Patient Instructions (Addendum)
Your procedure is scheduled on: July 09, 2017 [facility] Report to Day Surgery on the 2nd floor of the [facility]. To find out your arrival time, please call [phone] between 1PM - 3PM on: Tuesday July 08, 2017  REMEMBER: Instructions that are not followed completely may result in serious medical risk, up to and including death; or upon the discretion of your surgeon and anesthesiologist your surgery may need to be rescheduled.  Do not eat food after midnight the night before your procedure.  No gum chewing, lozengers or hard candies.  You may however, drink CLEAR liquids up to 2 hours before you are scheduled to arrive for your surgery. Do not drink anything within 2 hours of the start of your surgery.  Clear liquids include: - water  - apple juice without pulp - clear gatorade - black coffee or tea (Do NOT add anything to the coffee or tea) Do NOT drink anything that is not on this list.  Type 1 and Type 2 diabetics should only drink water.  No Alcohol for 24 hours before or after surgery.  No Smoking including e-cigarettes for 24 hours prior to surgery.  No chewable tobacco products for at least 6 hours prior to surgery.  No nicotine patches on the day of surgery.  On the morning of surgery brush your teeth with toothpaste and water, you may rinse your mouth with mouthwash if you wish. Do not swallow any toothpaste or mouthwash.  Notify your doctor if there is any change in your medical condition (cold, fever, infection).  Do not wear jewelry, make-up, hairpins, clips or nail polish.  Do not wear lotions, powders, or perfumes. You may wear deodorant.  Do not shave 48 hours prior to surgery. Men may shave face and neck.  Contacts and dentures may not be worn into surgery.  Do not bring valuables to the hospital, including drivers license, insurance or credit cards.  [facility] is not responsible for any belongings or valuables.   TAKE THESE MEDICATIONS THE MORNING  OF SURGERY: METOPROLOL SERTRALINE OMEPRAZOLE   Use CHG [place] directed on instruction sheet.  Follow recommendations from Cardiologist, Pulmonologist or PCP regarding stopping Aspirin, Coumadin, Plavix, Eliquis, Pradaxa, or Pletal. HOLD ELIQUIS LAST DOSE July 06, 2017   Stop Anti-inflammatories (NSAIDS) such as Advil, Aleve, Ibuprofen, Motrin, Naproxen, Naprosyn and Aspirin based products such as Excedrin, Goodys Powder, BC Powder. (May take Tylenol or Acetaminophen if needed.)  Stop ANY OVER THE COUNTER supplements until after surgery OMEGA 3, MELATONIN (May continue Vitamin D, Vitamin B, and multivitamin.)  Wear comfortable clothing (specific to your surgery type) to the hospital.  Plan for stool softeners for home use.  If you are being discharged the day of surgery, you will not be allowed to drive home. You will need a responsible adult to drive you home and stay with you that night.   If you are taking public transportation, you will need to have a responsible adult with you. Please confirm with your physician that it is acceptable to use public transportation.   Please call [phone] if you have any questions about these instructions.

## 2017-07-08 MED ORDER — CEFAZOLIN SODIUM-DEXTROSE 2-4 GM/100ML-% IV SOLN
2.0000 g | INTRAVENOUS | Status: AC
  Administered 2017-07-09: 2 g via INTRAVENOUS

## 2017-07-09 ENCOUNTER — Other Ambulatory Visit: Payer: Self-pay | Admitting: General Surgery

## 2017-07-09 ENCOUNTER — Ambulatory Visit
Admission: RE | Admit: 2017-07-09 | Discharge: 2017-07-09 | Disposition: A | Discharge status: Home / Self Care | Payer: Medicare Other | Source: Ambulatory Visit | Attending: General Surgery | Admitting: General Surgery

## 2017-07-09 ENCOUNTER — Encounter
Admission: RE | Disposition: A | Discharge status: Home / Self Care | Payer: Self-pay | Source: Ambulatory Visit | Attending: General Surgery

## 2017-07-09 ENCOUNTER — Ambulatory Visit: Payer: Medicare Other | Admitting: Certified Registered Nurse Anesthetist

## 2017-07-09 DIAGNOSIS — D0511 Intraductal carcinoma in situ of right breast: Secondary | ICD-10-CM | POA: Insufficient documentation

## 2017-07-09 DIAGNOSIS — Z79890 Hormone replacement therapy (postmenopausal): Secondary | ICD-10-CM | POA: Insufficient documentation

## 2017-07-09 DIAGNOSIS — N6091 Unspecified benign mammary dysplasia of right breast: Secondary | ICD-10-CM

## 2017-07-09 DIAGNOSIS — Z923 Personal history of irradiation: Secondary | ICD-10-CM | POA: Insufficient documentation

## 2017-07-09 DIAGNOSIS — K219 Gastro-esophageal reflux disease without esophagitis: Secondary | ICD-10-CM | POA: Insufficient documentation

## 2017-07-09 DIAGNOSIS — Z7901 Long term (current) use of anticoagulants: Secondary | ICD-10-CM | POA: Diagnosis not present

## 2017-07-09 DIAGNOSIS — Z79899 Other long term (current) drug therapy: Secondary | ICD-10-CM | POA: Diagnosis not present

## 2017-07-09 DIAGNOSIS — Z85828 Personal history of other malignant neoplasm of skin: Secondary | ICD-10-CM | POA: Diagnosis not present

## 2017-07-09 DIAGNOSIS — I110 Hypertensive heart disease with heart failure: Secondary | ICD-10-CM | POA: Diagnosis not present

## 2017-07-09 DIAGNOSIS — N6312 Unspecified lump in the right breast, upper inner quadrant: Secondary | ICD-10-CM | POA: Diagnosis not present

## 2017-07-09 DIAGNOSIS — D241 Benign neoplasm of right breast: Secondary | ICD-10-CM | POA: Diagnosis not present

## 2017-07-09 DIAGNOSIS — F329 Major depressive disorder, single episode, unspecified: Secondary | ICD-10-CM | POA: Diagnosis not present

## 2017-07-09 DIAGNOSIS — E782 Mixed hyperlipidemia: Secondary | ICD-10-CM | POA: Diagnosis not present

## 2017-07-09 DIAGNOSIS — I509 Heart failure, unspecified: Secondary | ICD-10-CM | POA: Diagnosis not present

## 2017-07-09 DIAGNOSIS — M1990 Unspecified osteoarthritis, unspecified site: Secondary | ICD-10-CM | POA: Diagnosis not present

## 2017-07-09 DIAGNOSIS — I4891 Unspecified atrial fibrillation: Secondary | ICD-10-CM | POA: Diagnosis not present

## 2017-07-09 DIAGNOSIS — Z9012 Acquired absence of left breast and nipple: Secondary | ICD-10-CM | POA: Diagnosis not present

## 2017-07-09 DIAGNOSIS — N6081 Other benign mammary dysplasias of right breast: Secondary | ICD-10-CM | POA: Diagnosis not present

## 2017-07-09 HISTORY — PX: BREAST LUMPECTOMY: SHX2

## 2017-07-09 HISTORY — PX: BREAST LUMPECTOMY WITH NEEDLE LOCALIZATION: SHX5759

## 2017-07-09 SURGERY — BREAST LUMPECTOMY WITH NEEDLE LOCALIZATION
Anesthesia: General | Site: Breast | Laterality: Right | Wound class: Clean

## 2017-07-09 MED ORDER — FENTANYL CITRATE (PF) 100 MCG/2ML IJ SOLN
INTRAMUSCULAR | Status: AC
  Filled 2017-07-09: qty 2

## 2017-07-09 MED ORDER — SEVOFLURANE IN SOLN
RESPIRATORY_TRACT | Status: AC
  Filled 2017-07-09: qty 250

## 2017-07-09 MED ORDER — FENTANYL CITRATE (PF) 100 MCG/2ML IJ SOLN
INTRAMUSCULAR | Status: DC | PRN
  Administered 2017-07-09 (×3): 50 ug via INTRAVENOUS

## 2017-07-09 MED ORDER — FENTANYL CITRATE (PF) 100 MCG/2ML IJ SOLN: 25.0000 ug | INTRAMUSCULAR | Status: DC | PRN

## 2017-07-09 MED ORDER — LACTATED RINGERS IV SOLN
INTRAVENOUS | Status: DC
  Administered 2017-07-09: 10:00:00 via INTRAVENOUS

## 2017-07-09 MED ORDER — PROPOFOL 10 MG/ML IV BOLUS
INTRAVENOUS | Status: DC | PRN
  Administered 2017-07-09: 60 mg via INTRAVENOUS
  Administered 2017-07-09: 80 mg via INTRAVENOUS

## 2017-07-09 MED ORDER — DEXAMETHASONE SODIUM PHOSPHATE 10 MG/ML IJ SOLN
INTRAMUSCULAR | Status: AC
Start: 2017-07-09 — End: ?
  Filled 2017-07-09: qty 1

## 2017-07-09 MED ORDER — LIDOCAINE HCL (CARDIAC) PF 100 MG/5ML IV SOSY
PREFILLED_SYRINGE | INTRAVENOUS | Status: DC | PRN
  Administered 2017-07-09: 100 mg via INTRAVENOUS

## 2017-07-09 MED ORDER — LIDOCAINE HCL (PF) 2 % IJ SOLN
INTRAMUSCULAR | Status: AC
  Filled 2017-07-09: qty 10

## 2017-07-09 MED ORDER — ETOMIDATE 2 MG/ML IV SOLN
INTRAVENOUS | Status: AC
  Filled 2017-07-09: qty 10

## 2017-07-09 MED ORDER — SUCCINYLCHOLINE CHLORIDE 20 MG/ML IJ SOLN
INTRAMUSCULAR | Status: DC | PRN
  Administered 2017-07-09: 100 mg via INTRAVENOUS

## 2017-07-09 MED ORDER — PHENYLEPHRINE HCL 10 MG/ML IJ SOLN
INTRAMUSCULAR | Status: DC | PRN
  Administered 2017-07-09 (×2): 100 ug via INTRAVENOUS

## 2017-07-09 MED ORDER — ETOMIDATE 2 MG/ML IV SOLN
INTRAVENOUS | Status: DC | PRN
  Administered 2017-07-09: 20 mg via INTRAVENOUS

## 2017-07-09 MED ORDER — MIDAZOLAM HCL 2 MG/2ML IJ SOLN
INTRAMUSCULAR | Status: DC | PRN
  Administered 2017-07-09 (×2): 1 mg via INTRAVENOUS

## 2017-07-09 MED ORDER — DEXAMETHASONE SODIUM PHOSPHATE 10 MG/ML IJ SOLN
INTRAMUSCULAR | Status: DC | PRN
  Administered 2017-07-09: 10 mg via INTRAVENOUS

## 2017-07-09 MED ORDER — ACETAMINOPHEN 10 MG/ML IV SOLN
INTRAVENOUS | Status: DC | PRN
  Administered 2017-07-09: 1000 mg via INTRAVENOUS

## 2017-07-09 MED ORDER — ONDANSETRON HCL 4 MG/2ML IJ SOLN
INTRAMUSCULAR | Status: AC
  Filled 2017-07-09: qty 2

## 2017-07-09 MED ORDER — HYDROCODONE-ACETAMINOPHEN 5-325 MG PO TABS: 1.0000 | ORAL_TABLET | ORAL | 0 refills | Status: AC | PRN

## 2017-07-09 MED ORDER — ACETAMINOPHEN 10 MG/ML IV SOLN
INTRAVENOUS | Status: AC
  Filled 2017-07-09: qty 100

## 2017-07-09 MED ORDER — ROCURONIUM BROMIDE 100 MG/10ML IV SOLN
INTRAVENOUS | Status: DC | PRN
  Administered 2017-07-09: 5 mg via INTRAVENOUS

## 2017-07-09 MED ORDER — MIDAZOLAM HCL 2 MG/2ML IJ SOLN
INTRAMUSCULAR | Status: AC
  Filled 2017-07-09: qty 2

## 2017-07-09 MED ORDER — BUPIVACAINE-EPINEPHRINE (PF) 0.5% -1:200000 IJ SOLN
INTRAMUSCULAR | Status: AC
  Filled 2017-07-09: qty 30

## 2017-07-09 MED ORDER — EPHEDRINE SULFATE 50 MG/ML IJ SOLN
INTRAMUSCULAR | Status: DC | PRN
  Administered 2017-07-09 (×2): 10 mg via INTRAVENOUS

## 2017-07-09 MED ORDER — GLYCOPYRROLATE 0.2 MG/ML IJ SOLN
INTRAMUSCULAR | Status: DC | PRN
  Administered 2017-07-09: 0.2 mg via INTRAVENOUS

## 2017-07-09 MED ORDER — ONDANSETRON HCL 4 MG/2ML IJ SOLN: 4.0000 mg | Freq: Once | INTRAMUSCULAR | Status: DC | PRN

## 2017-07-09 MED ORDER — CEFAZOLIN SODIUM-DEXTROSE 2-4 GM/100ML-% IV SOLN
INTRAVENOUS | Status: AC
  Filled 2017-07-09: qty 100

## 2017-07-09 MED ORDER — ROCURONIUM BROMIDE 50 MG/5ML IV SOLN
INTRAVENOUS | Status: AC
  Filled 2017-07-09: qty 1

## 2017-07-09 MED ORDER — ONDANSETRON HCL 4 MG/2ML IJ SOLN
INTRAMUSCULAR | Status: DC | PRN
  Administered 2017-07-09: 4 mg via INTRAVENOUS

## 2017-07-09 MED ORDER — SUCCINYLCHOLINE CHLORIDE 20 MG/ML IJ SOLN
INTRAMUSCULAR | Status: AC
  Filled 2017-07-09: qty 1

## 2017-07-09 MED ORDER — BUPIVACAINE-EPINEPHRINE (PF) 0.5% -1:200000 IJ SOLN
INTRAMUSCULAR | Status: DC | PRN
  Administered 2017-07-09: 20 mL via PERINEURAL

## 2017-07-09 SURGICAL SUPPLY — 30 items
CANISTER SUCT 1200ML W/VALVE (MISCELLANEOUS) ×2 IMPLANT
CHLORAPREP W/TINT 26ML (MISCELLANEOUS) ×2 IMPLANT
CNTNR SPEC 2.5X3XGRAD LEK (MISCELLANEOUS) ×2
CONT SPEC 4OZ STER OR WHT (MISCELLANEOUS) ×2
CONTAINER SPEC 2.5X3XGRAD LEK (MISCELLANEOUS) ×2 IMPLANT
DERMABOND ADVANCED (GAUZE/BANDAGES/DRESSINGS) ×1
DERMABOND ADVANCED .7 DNX12 (GAUZE/BANDAGES/DRESSINGS) ×1 IMPLANT
DRAPE LAPAROTOMY 77X122 PED (DRAPES) ×2 IMPLANT
ELECT REM PT RETURN 9FT ADLT (ELECTROSURGICAL) ×2
ELECTRODE REM PT RTRN 9FT ADLT (ELECTROSURGICAL) ×1 IMPLANT
GLOVE BIO SURGEON STRL SZ 6.5 (GLOVE) ×2 IMPLANT
GOWN STRL REUS W/ TWL LRG LVL3 (GOWN DISPOSABLE) ×3 IMPLANT
GOWN STRL REUS W/TWL LRG LVL3 (GOWN DISPOSABLE) ×3
KIT TURNOVER KIT A (KITS) ×2 IMPLANT
LABEL OR SOLS (LABEL) ×2 IMPLANT
MARGIN MAP 10MM (MISCELLANEOUS) ×2 IMPLANT
NEEDLE HYPO 25X1 1.5 SAFETY (NEEDLE) ×4 IMPLANT
PACK BASIN MINOR ARMC (MISCELLANEOUS) ×2 IMPLANT
SPONGE XRAY 4X4 16PLY STRL (MISCELLANEOUS) ×2 IMPLANT
SUT ETHILON 3-0 FS-10 30 BLK (SUTURE) ×2
SUT MNCRL 4-0 (SUTURE) ×1
SUT MNCRL 4-0 27XMFL (SUTURE) ×1
SUT PROLENE 0 CT 1 30 (SUTURE) ×2 IMPLANT
SUT SILK 2 0 SH (SUTURE) ×4 IMPLANT
SUT VIC AB 3-0 SH 27 (SUTURE) ×1
SUT VIC AB 3-0 SH 27X BRD (SUTURE) ×1 IMPLANT
SUTURE EHLN 3-0 FS-10 30 BLK (SUTURE) ×1 IMPLANT
SUTURE MNCRL 4-0 27XMF (SUTURE) ×1 IMPLANT
SYR 10ML LL (SYRINGE) ×2 IMPLANT
WATER STERILE IRR 1000ML POUR (IV SOLUTION) ×2 IMPLANT

## 2017-07-09 NOTE — Anesthesia Preprocedure Evaluation (Addendum)
Anesthesia Evaluation  Patient identified by MRN, date of birth, ID band Patient awake    Reviewed: Allergy & Precautions, NPO status , Patient's Chart, lab work & pertinent test results  Airway Mallampati: III  TM Distance: <3 FB     Dental  (+) Chipped   Pulmonary neg pulmonary ROS,    Pulmonary exam normal        Cardiovascular hypertension, Pt. on medications and Pt. on home beta blockers +CHF  Normal cardiovascular exam+ dysrhythmias Atrial Fibrillation      Neuro/Psych PSYCHIATRIC DISORDERS Depression negative neurological ROS     GI/Hepatic Neg liver ROS, GERD  Medicated and Controlled,  Endo/Other  negative endocrine ROS  Renal/GU negative Renal ROS  negative genitourinary   Musculoskeletal  (+) Arthritis , Osteoarthritis,    Abdominal Normal abdominal exam  (+)   Peds negative pediatric ROS (+)  Hematology negative hematology ROS (+)   Anesthesia Other Findings Past Medical History: No date: Afib ([place]) No date: Arthritis 2006: Breast cancer ([place])     Comment:  left breast No date: Depression No date: GERD (gastroesophageal reflux disease) No date: Hypertension No date: Insomnia No date: Insomnia 2006: Personal history of radiation therapy     Comment:  36 tx  EF 45%  Reproductive/Obstetrics                            Anesthesia Physical  Anesthesia Plan  ASA: III  Anesthesia Plan: General   Post-op Pain Management:    Induction: Intravenous  PONV Risk Score and Plan:   Airway Management Planned: Oral ETT  Additional Equipment:   Intra-op Plan:   Post-operative Plan: Extubation in OR  Informed Consent: I have reviewed the patients History and Physical, chart, labs and discussed the procedure including the risks, benefits and alternatives for the proposed anesthesia with the patient or authorized representative who has indicated his/her understanding and  acceptance.   Dental advisory given  Plan Discussed with: CRNA and Surgeon  Anesthesia Plan Comments:         Anesthesia Quick Evaluation

## 2017-07-09 NOTE — Discharge Instructions (Signed)
°  Diet: Resume home heart healthy regular diet.   Activity: No heavy lifting >20 pounds (children, pets, laundry, garbage) or strenuous activity until follow-up, but light activity and walking are encouraged. Do not drive or drink alcohol if taking narcotic pain medications.  Wound care: May shower with soapy water and pat dry (do not rub incisions), but no baths or submerging incision underwater until follow-up. (no swimming)   Medications: Resume all home medications except Eliquis. For mild to moderate pain: acetaminophen (Tylenol) or ibuprofen (if no kidney disease). Combining Tylenol with alcohol can substantially increase your risk of causing liver disease. Narcotic pain medications, if prescribed, can be used for severe pain, though may cause nausea, constipation, and drowsiness. Do not combine Tylenol and Norco within a 6 hour period as Norco contains Tylenol. If you do not need the narcotic pain medication, you do not need to fill the prescription.  May restart Eliquis on 07/11/17.  If develops constipation may use a laxative  Call office [phone]) at any time if any questions, worsening pain, fevers/chills, bleeding, drainage from incision site, or other concerns.  AMBULATORY SURGERY  DISCHARGE INSTRUCTIONS   1) The drugs that you were given will stay in your system until tomorrow so for the next 24 hours you should not:  A) Drive an automobile B) Make any legal decisions C) Drink any alcoholic beverage   2) You may resume regular meals tomorrow.  Today it is better to start with liquids and gradually work up to solid foods.  You may eat anything you prefer, but it is better to start with liquids, then soup and crackers, and gradually work up to solid foods.   3) Please notify your doctor immediately if you have any unusual bleeding, trouble breathing, redness and pain at the surgery site, drainage, fever, or pain not relieved by medication.    4) Additional  Instructions:        Please contact your physician with any problems or Same Day Surgery at [phone], Monday through Friday 6 am to 4 pm, or [place] at [facility] number at [phone].

## 2017-07-09 NOTE — Transfer of Care (Signed)
Immediate Anesthesia Transfer of Care Note  Patient: Melissa Chase  Procedure(s) Performed: BREAST LUMPECTOMY WITH NEEDLE LOCALIZATION (Right Breast)  Patient Location: PACU  Anesthesia Type:General  Level of Consciousness: sedated  Airway & Oxygen Therapy: Patient Spontanous Breathing and Patient connected to face mask oxygen  Post-op Assessment: Report given to RN and Post -op Vital signs reviewed and stable  Post vital signs: Reviewed and stable  Last Vitals:  Vitals Value Taken Time  BP 139/60 07/09/2017 12:25 PM  Temp    Pulse 62 07/09/2017 12:29 PM  Resp 13 07/09/2017 12:29 PM  SpO2 100 % 07/09/2017 12:29 PM  Vitals shown include unvalidated device data.  Last Pain:  Vitals:   07/09/17 0922  TempSrc: Oral  PainSc: 0-No pain         Complications: No apparent anesthesia complications

## 2017-07-09 NOTE — Interval H&P Note (Signed)
History and Physical Interval Note:  07/09/2017 9:36 AM  Melissa Chase  has presented today for surgery, with the diagnosis of ATYPICAL DUCTAL HYPERPLASIA OF RIGHT BREAST  The various methods of treatment have been discussed with the patient and family. After consideration of risks, benefits and other options for treatment, the patient has consented to  Procedure(s): BREAST LUMPECTOMY WITH NEEDLE LOCALIZATION (Right) as a surgical intervention .  The patient's history has been reviewed, patient examined, no change in status, stable for surgery.  I have reviewed the patient's chart and labs. Right breast marked in the pre procedure room. Questions were answered to the patient's satisfaction.     Herbert Pun

## 2017-07-09 NOTE — Anesthesia Postprocedure Evaluation (Signed)
Anesthesia Post Note  Patient: Melissa Chase  Procedure(s) Performed: BREAST LUMPECTOMY WITH NEEDLE LOCALIZATION (Right Breast)  Patient location during evaluation: PACU Anesthesia Type: General Level of consciousness: awake and alert and oriented Pain management: pain level controlled Vital Signs Assessment: post-procedure vital signs reviewed and stable Respiratory status: spontaneous breathing Cardiovascular status: blood pressure returned to baseline Anesthetic complications: no     Last Vitals:  Vitals:   07/09/17 1306 07/09/17 1419  BP: (!) 148/63 135/61  Pulse: 60 (!) 59  Resp: 16   Temp: (!) 35.9 C   SpO2: 97% 98%    Last Pain:  Vitals:   07/09/17 1419  TempSrc:   PainSc: 0-No pain                 Fedor Kazmierski

## 2017-07-09 NOTE — Anesthesia Procedure Notes (Signed)
Procedure Name: Intubation Date/Time: 07/09/2017 10:04 AM Performed by: Johnna Acosta, CRNA Pre-anesthesia Checklist: Patient identified, Emergency Drugs available, Suction available, Patient being monitored and Timeout performed Patient Re-evaluated:Patient Re-evaluated prior to induction Oxygen Delivery Method: Circle system utilized Preoxygenation: Pre-oxygenation with 100% oxygen Induction Type: IV induction Ventilation: Mask ventilation without difficulty and Oral airway inserted - appropriate to patient size Laryngoscope Size: McGraph and 3 Tube type: Oral Tube size: 7.0 mm Number of attempts: 1 Airway Equipment and Method: Stylet and Video-laryngoscopy Placement Confirmation: ETT inserted through vocal cords under direct vision,  positive ETCO2 and breath sounds checked- equal and bilateral Secured at: 21 cm Tube secured with: Tape Dental Injury: Teeth and Oropharynx as per pre-operative assessment  Difficulty Due To: Difficulty was anticipated, Difficult Airway- due to large tongue, Difficult Airway- due to limited oral opening and Difficult Airway- due to dentition Future Recommendations: Recommend- induction with short-acting agent, and alternative techniques readily available

## 2017-07-09 NOTE — Addendum Note (Signed)
Addendum  created 07/09/17 0941 by Alvin Critchley, MD   Sign clinical note

## 2017-07-09 NOTE — Anesthesia Post-op Follow-up Note (Signed)
Anesthesia QCDR form completed.        

## 2017-07-09 NOTE — Op Note (Signed)
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   Procedure: Right needle-localized breast lumpectomy.                       Anesthesia: GETA  Surgeon: Dr. Windell Moment  Wound Classification: Clean  Indications: Patient is a 77 y.o. female with a nonpalpable right breast mass noted on mammography with core biopsy demonstrating atypical papillary neoplasm requires needle-localized lumpectomy for treatment.   Findings: 1. Specimen mammography shows marker and wire on specimen 2. Pathology call refers gross examination of margins was abutting the lateral margin 3. No other palpable mass or lymph node identified.   Description of procedure: Preoperative needle localization was performed by radiology. Localization studies were reviewed. The patient was taken to the operating room and placed supine on the operating table, and after general anesthesia the right chest was prepped and draped in the usual sterile fashion. A time-out was completed verifying correct patient, procedure, site, positioning, and implant(s) and/or special equipment prior to beginning this procedure.  By comparing the localization studies with the direction and skin entry site of the needle, the probable trajectory and location of the mass was visualized. A circumareolar skin incision was planned in such a way as to minimize the amount of dissection to reach the mass.  The skin incision was made. Flaps were raised and the location of the wire confirmed. The wire was delivered into the wound. A 2-0 silk figure-of-eight stay suture was placed around the wire and used for retraction. Dissection was then taken down circumferentially, taking care to include the entire localizing needle and a wide margin of grossly normal tissue. The specimen and entire localizing wire were removed. The specimen was oriented and sent to radiology with the localization  studies. Pathology called back referring the the lateral margin was abutting the border. The lateral margin was re excised. The wound was irrigated. Hemostasis was checked. The wound was closed with interrupted sutures of 3-0 Vicryl and a subcuticular suture of Monocryl 4-0. No attempt was made to close the dead space. A dressing was applied.   The patient tolerated the procedure well and was taken to the postanesthesia care unit in stable condition.   Specimen: Right Breast mass (Orientation markers used: Cranial, Caudal, Medial, Lateral,Skin, Deep)                   Right breast lateral margin   Complications: None  Estimated Blood Loss: 10 mL

## 2017-07-14 LAB — SURGICAL PATHOLOGY

## 2017-07-17 ENCOUNTER — Encounter: Payer: Self-pay | Admitting: *Deleted

## 2017-07-17 ENCOUNTER — Other Ambulatory Visit: Payer: Self-pay

## 2017-07-17 NOTE — Progress Notes (Signed)
Oncology Nurse Navigator Documentation    Oncology Nurse Navigator Documentation  Navigator Location: [facility]-Med Onc (07/17/17 1000) Referral date to RadOnc/MedOnc: 07/22/17 (07/17/17 1000) )Navigator Encounter Type: Introductory phone call (07/17/17 1000)   Abnormal Finding Date: 06/06/17 (07/17/17 1000) Confirmed Diagnosis Date: 07/09/17 (07/17/17 1000) Surgery Date: 07/09/17 (07/17/17 1000)                                            Time Spent with Patient: 15 (07/17/17 1000)   Called patient to establish navigation services.  Left message for her to return my call.  Patient and her daughter Melissa Chase returned my call.  Reviewed pathology and answered her and Lisa's questions.  Will meet with patient at her consult with rad/onc and med/onc on 07/22/17.  Will give her her educational literature at that time.  She is to call if she has any questions or needs.

## 2017-07-22 ENCOUNTER — Encounter: Payer: Self-pay | Admitting: Radiation Oncology

## 2017-07-22 ENCOUNTER — Inpatient Hospital Stay: Payer: Medicare Other | Attending: Hematology and Oncology | Admitting: Hematology and Oncology

## 2017-07-22 ENCOUNTER — Encounter: Payer: Self-pay | Admitting: *Deleted

## 2017-07-22 ENCOUNTER — Encounter: Payer: Self-pay | Admitting: Hematology and Oncology

## 2017-07-22 ENCOUNTER — Ambulatory Visit
Admission: RE | Admit: 2017-07-22 | Discharge: 2017-07-22 | Disposition: A | Discharge status: Home / Self Care | Payer: Medicare Other | Source: Ambulatory Visit | Attending: Radiation Oncology | Admitting: Radiation Oncology

## 2017-07-22 ENCOUNTER — Other Ambulatory Visit: Payer: Self-pay

## 2017-07-22 VITALS — BP 137/79 | HR 58 | Temp 98.1°F | Resp 18 | Wt 242.4 lb

## 2017-07-22 VITALS — BP 142/90 | HR 60 | Temp 97.9°F | Resp 18 | Ht 65.5 in | Wt 241.4 lb

## 2017-07-22 DIAGNOSIS — F329 Major depressive disorder, single episode, unspecified: Secondary | ICD-10-CM | POA: Insufficient documentation

## 2017-07-22 DIAGNOSIS — Z923 Personal history of irradiation: Secondary | ICD-10-CM | POA: Insufficient documentation

## 2017-07-22 DIAGNOSIS — Z170 Estrogen receptor positive status [ER+]: Secondary | ICD-10-CM | POA: Diagnosis not present

## 2017-07-22 DIAGNOSIS — I10 Essential (primary) hypertension: Secondary | ICD-10-CM | POA: Diagnosis not present

## 2017-07-22 DIAGNOSIS — C50911 Malignant neoplasm of unspecified site of right female breast: Secondary | ICD-10-CM

## 2017-07-22 DIAGNOSIS — D0511 Intraductal carcinoma in situ of right breast: Secondary | ICD-10-CM | POA: Diagnosis not present

## 2017-07-22 DIAGNOSIS — D0512 Intraductal carcinoma in situ of left breast: Secondary | ICD-10-CM | POA: Insufficient documentation

## 2017-07-22 NOTE — Progress Notes (Signed)
[facility] day:  07/22/2017  Chief Complaint: Melissa Chase is a 77 y.o. 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 Windell Moment for assessment and management.  HPI:  The patient has a history of left breast DCIS in 2006.  She palpated a lump.  She underwent lumpectomy followed by radiation.  She was on tamoxifen for 5 years.  She underwent regular screening mammograms until a few years ago.  She states that she palpated a right subareolar lump several months ago.  The area became "hard and sore".  She sought medical attention at the urging of her daughter.  Bilateral mammogram and ultrasound on 06/13/2017 revealed a solid 1.1 x 0.9 x 0.5 cm right breast mass 1 cm from the nipple with interval vascularity.  There was no evidence of right axillary lymphadenopathy.  She underwent ultrasound guided core biopsy on 06/25/2017.  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  Consideration included ductal carcinoma in situ involving a papilloma and encapsulated papillary carcinoma.  She underwent lumpectomy with wire localization on 07/09/2017 by Dr. Herbert Pun.  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  Lateral excision revealed atypical ductal hyperplasia.  Margins were negative.  Tumor was ER positive (> 90%) and PR positive (> 90%).  Pathologic stage was TisNx.  Symptomatically, she feels fine.  She notes a little soreness as she is still healing.  She underwent menses at age 25.  Her first pregnancy was at age 19.  She had 3 children.  She did not breast feed.  She was on birth control pills x 1 month.  She started to have menopausal symptoms in her 74s.  She was on hormone replacement therapy for a "long time".  She denies any family history of breast cancer.   Past  Medical History:  Diagnosis Date  . Afib ([place])   . Arthritis   . Breast cancer ([place]) 2006   left breast  . Depression   . GERD (gastroesophageal reflux disease)   . Hypertension   . Insomnia   . Insomnia   . Personal history of radiation therapy 2006   37 tx.    Past Surgical History:  Procedure Laterality Date  . BREAST LUMPECTOMY WITH NEEDLE LOCALIZATION Right 07/09/2017   Procedure: BREAST LUMPECTOMY WITH NEEDLE LOCALIZATION;  Surgeon: Herbert Pun, MD;  Location: [facility] ORS;  Service: General;  Laterality: Right;  . BREAST SURGERY     left breast lumpectomy  . CARDIOVERSION N/A 03/21/2016   Procedure: Cardioversion;  Surgeon: Corey Skains, MD;  Location: [facility] ORS;  Service: Cardiovascular;  Laterality: N/A;  . CATARACT EXTRACTION, BILATERAL    . LAPAROSCOPIC CHOLECYSTECTOMY    . TONSILLECTOMY    . VAGINAL HYSTERECTOMY      Family History  Problem Relation Age of Onset  . Diabetes Sister     Social History:  reports that she has never smoked. She has never used smokeless tobacco. She reports that she does not drink alcohol or use drugs.  She denies any exposure to radiation or toxins.  She lives alone in [place].  Her daughter, [name], lives 5-6 miles away.  The patient is accompanied by by her friend, Truman Hayward, today.  Allergies: No Known Allergies  Current Medications: Current Outpatient Medications  Medication Sig Dispense Refill  . acetaminophen (TYLENOL) 500 MG  tablet Take 1,000 mg by mouth 2 (two) times daily as needed (for pain.).    Marland Kitchen apixaban (ELIQUIS) 5 MG TABS tablet Take 5 mg by mouth 2 (two) times daily.    . diphenhydramine-acetaminophen (TYLENOL PM) 25-500 MG TABS tablet Take 1-2 tablets by mouth at bedtime as needed (FOR SLEEP.).    Marland Kitchen estradiol (ESTRACE) 0.1 MG/GM vaginal cream Place 1 Applicatorful vaginally at bedtime as needed (FOR VAGINAL DRYNESS.).    Marland Kitchen hydrochlorothiazide (MICROZIDE) 12.5 MG capsule Take 1 capsule (12.5 mg total) by mouth  daily. 30 capsule 6  . HYDROcodone-acetaminophen (NORCO/VICODIN) 5-325 MG tablet Take 1 tablet by mouth every 4 (four) hours as needed. for pain  0  . Melatonin 10 MG TABS Take 10 mg by mouth at bedtime.    . metoprolol succinate (TOPROL-XL) 100 MG 24 hr tablet Take 1 tablet (100 mg total) by mouth daily. Take with or immediately following a meal. 30 tablet 6  . Multiple Vitamins-Minerals (MULTIVITAMIN WITH MINERALS) tablet Take 1 tablet by mouth daily.    Marland Kitchen nystatin (MYCOSTATIN) 100000 UNIT/ML suspension Take 5 mLs by mouth 4 (four) times daily as needed (for mouth sores).    . Omega-3 1000 MG CAPS Take 1 capsule (1,000 mg total) by mouth daily. 90 capsule 3  . omeprazole (PRILOSEC) 40 MG capsule Take 1 capsule (40 mg total) by mouth 2 (two) times daily. 60 capsule 11  . pravastatin (PRAVACHOL) 20 MG tablet Take 1 tablet (20 mg total) by mouth daily. (Patient taking differently: Take 20 mg by mouth at bedtime. ) 30 tablet 6  . sertraline (ZOLOFT) 100 MG tablet TAKE 1 TABLET BY MOUTH EVERY DAY 90 tablet 0  . Sulfacetamide Sodium (BLEPH-10 OP) Place 1 drop into both eyes every 3 (three) hours as needed (for dry eyes.).    Marland Kitchen omeprazole (PRILOSEC) 40 MG capsule TAKE 1 CAPSULE BY MOUTH EVERY DAY 30 capsule 1   No current facility-administered medications for this visit.     Review of Systems:  GENERAL:  Feels "fine".  No fevers, sweats or weight loss. PERFORMANCE STATUS (ECOG):  0 HEENT:  No visual changes, runny nose, sore throat, mouth sores or tenderness. Lungs: No shortness of breath or cough.  No hemoptysis. Cardiac:  No chest pain, palpitations, orthopnea, or PND. GI:  Little constipation following surgery managed with Senokot.  No nausea, vomiting, diarrhea, melena or hematochezia. GU:  No urgency, frequency, dysuria, or hematuria. Musculoskeletal:  No back pain.  No joint pain.  No muscle tenderness. Extremities:  No pain or swelling. Skin:  Feels a little sore post-op.  No rashes or  skin changes. Neuro:  No headache, numbness or weakness, balance or coordination issues. Endocrine:  No diabetes, thyroid issues, hot flashes or night sweats. Psych:  No mood changes, depression or anxiety. Pain:  No focal pain. Review of systems:  All other systems reviewed and found to be negative.  Physical Exam: Blood pressure (!) 142/90, pulse 60, temperature 97.9 F (36.6 C), temperature source Tympanic, resp. rate 18, height 5' 5.5" (1.664 m), weight 241 lb 7 oz (109.5 kg). GENERAL:  Well developed, well nourished, woman sitting comfortably in the exam room in no acute distress. MENTAL STATUS:  Alert and oriented to person, place and time. HEAD:  Dark hair.  Normocephalic, atraumatic, face symmetric, no Cushingoid features. EYES:  Brown eyes.  Pupils equal round and reactive to light and accomodation.  No conjunctivitis or scleral icterus. ENT:  Oropharynx clear without lesion.  Tongue normal. Mucous membranes moist.  RESPIRATORY:  Clear to auscultation without rales, wheezes or rhonchi. CARDIOVASCULAR:  Regular rate and rhythm without murmur, rub or gallop. BREAST:  Right breast with post-operative changes 12 o'clock to 3 o'clock.  Melissa Chase with surgical glue at incision.  Abrasion.  No discrete masses, skin changes or nipple discharge.  Left breast with well healed post-operative changes (old) at 11 o'clock.  No masses, skin changes or nipple discharge.  ABDOMEN:  Soft, non-tender, with active bowel sounds, and no hepatosplenomegaly.  No masses. SKIN:  No rashes, ulcers or lesions. EXTREMITIES: No edema, no skin discoloration or tenderness.  No palpable cords. LYMPH NODES: No palpable cervical, supraclavicular, axillary or inguinal adenopathy  NEUROLOGICAL: Unremarkable. PSYCH:  Appropriate.   No visits with results within 3 Day(s) from this visit.  Latest known visit with results is:  Admission on 07/09/2017, Discharged on 07/09/2017  Component Date Value Ref Range Status  .  SURGICAL PATHOLOGY 07/09/2017    Final-Edited                   Value:Surgical Pathology THIS IS AN ADDENDUM REPORT CASE: [id number] PATIENT: [facility] Surgical Pathology Report Addendum  Reason for Addendum #1:  Immunohistochemistry results  SPECIMEN SUBMITTED: A. Breast mass, right B. Breast marker, right C. Breast, right; lateral margin  CLINICAL HISTORY: None provided  PRE-OPERATIVE DIAGNOSIS: Atypical ductal hyperplasia of right breast  POST-OPERATIVE DIAGNOSIS: Atypical ductal hyperplasia of right  DIAGNOSIS: A. BREAST, RIGHT; ULTRASOUND-GUIDED LUMPECTOMY WITH WIRE LOCALIZATION: - DUCTAL CARCINOMA IN SITU, WITH INVOLVEMENT OF PRE-EXISTING PAPILLOMA AND ADJACENT DUCTS. - BIOPSY SITE CHANGES PRESENT. - SEE COMMENT AND SUMMARY BELOW.  B. BREAST MARKER CLIP, RIGHT; REMOVAL: - MARKER CLIP AND CAUTERIZED TISSUE.  C. BREAST, RIGHT, LATERAL MARGIN; RE-EXCISION: - ATYPICAL DUCTAL HYPERPLASIA. - BIOPSY SITE CHANGES.  Comment: The papillary neoplasm was transected at the cauterized                          inked lateral margin, at the deep margin. Because it is fragmented and cauterized, the size of the papillomatous lesion cannot be determined. Focally there is residual pre-existing papilloma. The re-excised lateral margin is negative for carcinoma.  ER and PR will be assessed by immunohistochemistry (IHC) on block A1. The result will be reported in an addendum.   CANCER CASE SUMMARY: DUCTAL CARCINOMA IN SITU OF THE BREAST Procedure: Excision Specimen Laterality: Right Size (Extent) of DCIS:  at least 12 mm Histologic Type: Ductal carcinoma in situ Nuclear Grade: 1-2 Necrosis: Not identified Margins: Uninvolved by DCIS                      Distance from closest margin: At least 10 mm Regional Lymph nodes: Lymph nodes not submitted or found Pathologic Stage Classification (pTNM, AJCC 8th Edition): pTis(DCIS) pNX   GROSS DESCRIPTION: A. intraoperative  Consultation:     Labeled: Right breast mass     Received: Fresh     Specimen: Right breast mass     Pathologic                          evaluation performed: Immediate gross evaluation of margins     Diagnosis: Right breast (medial):     - Biopsy site/lesion identified.     - Close to lateral margin at inferior, additional tissue requested     Communicated to: Dr. Windell Moment at 11:31  AM on 07/09/2017, Bryan Lemma, MD     Tissue submitted: n/a  A. Labeled: Right breast mass Received: Fresh Accompanying specimen radiograph: Yes Radiographic findings: 1 wire, no marker clip Time in fixative: 11:31 AM on 07/09/2017  Cold ischemic time: 40 minutes Total fixation time: 9 hours Type of procedure: Excision Location / laterality of specimen: Right Orientation of specimen: Cranial, caudal, skin and deep metallic markers (Dr Dicie Beam talked with Dr Windell Moment it was determined that medial and lateral orientation markers were misplaced/switched) Inking: Superior = blue Inferior = green Medial = yellow Lateral = orange Posterior = black Anterior/Superficial = red Size of specimen: 6.9 (anterior-posterior                          by 6.1 (superior-inferior) by 3.1 (medial-lateral) centimeter Skin: Not present Biopsy site: One area suggestive of biopsy site  Number of discrete masses: 1 Size of mass(es): 1.0 x 0.7 x 1.0 Description of mass(es): Slightly rubbery tan centrally hemorrhagic nodule Distance between masses/clips: Not applicable Margins: Abutting the lateral and inferior, 2.4 cm from superior, 2.1 cm from anterior, 1.0 cm from deep and 1.7 cm from medial Description of remainder of tissue: Yellow lobulated fibrous and fatty  Block summary: 1-7 - entire mass from anterior towards posterior respectively with closest margins perpendicular 8 - perpendicular medial 9 - perpendicular anterior 10 - perpendicular superior  B. Labeled: Right breast marker Received: In  formalin Tissue fragment(s): 1 Size: 0.6 x 0.2 x 0.1 cm Description: Yellow-tan fragment of tissue with a metallic marker clip, clip removed Entirely submitted in one cassette.  C. Labeled: Right breast lateral margin,                          short Prolene skin, long Prolene cranial, silk on cavity site Received: In formalin Accompanying specimen radiograph: No Radiographic findings: Not applicable Time in fixative: 11:53 AM on 07/09/2017  Cold ischemic time: Not provided Total fixation time: 8.5 hours Type of procedure: Lumpectomy Location / laterality of specimen: Right Orientation of specimen: Short Prolene skin, long Prolene cranial, silk on cavity site Inking: Superior = blue Inferior = green Medial = yellow (with black silk stitch) Lateral = orange Posterior = black Anterior/Superficial = red Size of specimen: 6.3 (anterior-posterior) by 7.3 (superior-inferior) by 2.3 (medial-lateral) centimeter Skin: None submitted Biopsy site: Surface with black suture  Number of discrete masses: No mass identified Description of remainder of tissue: Yellow lobulated fibrofatty  Block summary: 1-7 - multiple representative sections of fibrous areas   Final Diagnosis performed by Bryan Lemma, MD.   Elec                         tronically signed 07/11/2017 5:03:35PM The electronic signature indicates that the named Attending Pathologist has evaluated the specimen  Technical component performed at [facility], [address], [place], [place] [address] Lab: [phone] Dir: Rush Farmer, MD, MMM  Professional component performed at [facility], [facility], [place], [place], [place] [address] Lab: [phone] Dir: Dellia Nims. Rubinas, MD  ADDENDUM: BREAST BIOMARKER TESTS Estrogen Receptor (ER) Status: POSITIVE Percentage of cells with nuclear positivity: >90% Average intensity of staining: Strong  Progesterone Receptor (PgR) Status: POSITIVE Percentage of  cells with nuclear positivity: >90% Average intensity of staining: Strong  Cold Ischemia and Fixation Times: Meet requirements specified in latest version of the ASCO/CAP guidelines Testing Performed  on Block Number(s): A1  METHODS Fixative: Formalin Estrogen Receptor:  FDA cleared ([place]) Primary Antibody:  SP1 Pro                         gesterone Receptor: FDA cleared ([place]) Primary Antibody: 1E2 Immunohistochemistry controls worked appropriately. Slides were prepared by [facility], [place], [place], and interpreted by Dr. Dicie Beam.  This test was developed and its performance characteristics determined by [facility]. It has not been cleared or approved by the [place] Food and Drug Administration. The FDA does not require this test to go through premarket FDA review. This test is used for clinical purposes. It should not be regarded as investigational or for research. This laboratory is certified under the Clinical Laboratory Improvement Amendments (CLIA) as qualified to perform high complexity clinical laboratory testing.   Addendum #1 performed by Bryan Lemma, MD.   Electronically signed 07/14/2017 3:10:09PM The electronic signature indicates that the named Attending Pathologist has evaluated the specimen  Technical component performed at [facility], [address] [place],                          [place] [address] Lab: [phone] Dir: Rush Farmer, MD, MMM  Professional component performed at [facility], [facility], [place], [place], [place] [address] Lab: [phone] Dir: Dellia Nims. Rubinas, MD     Assessment:  Jordyan Hardiman is a 77 y.o. 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  She underwent right lumpectomy, radiation, and 5 years of tamoxifen in 2006.  She is s/p left lumpectomy on 07/09/2017.   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  Lateral excision revealed atypical ductal hyperplasia.  Margins were negative.  Tumor was ER positive (> 90%) and PR positive (> 90%). Pathologic stage was TisNx.  Bilateral mammogram and ultrasound on 06/13/2017 revealed a solid 1.1 x 0.9 x 0.5 cm right breast mass 1 cm from the nipple with interval vascularity.  There was no evidence of right axillary lymphadenopathy.  Symptomatically, she denies any complaint.  Exam reveals post-operative changes.  Plan: 1.  Discuss DCIS.  Discuss plan for radiation followed by hormonal therapy.  Discuss tamoxifen versus aromatase inhibitor.  Given 8 years following completion of tamoxifen for left breast DCIS and recent diagnosis of DCIS, she is not felt to be a tamoxifen failure.  Discuss aromatase inhibitors in detail.  Side effects reviewed.  Discuss obtaining a baseline bone density study secondary to the risk of bone thinning with an aromatase inhibitor. 2.  Referral to radiation oncology. 3.  Bone density study. 4.  RTC after completion of radiation for MD assessment, labs (CBC with diff, CMP), and initiation of hormonal therapy.   Lequita Asal, MD  07/22/2017, 4:35 PM

## 2017-07-22 NOTE — Progress Notes (Signed)
New patient in for DCIS.  Friend is with patient today.

## 2017-07-22 NOTE — Progress Notes (Signed)
NEW PATIENT EVALUATION  Name: Melissa Chase  MRN: [id number]  Date:   07/22/2017     DOB: [birth year]   This 77 y.o. 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 Breast Cancer    Pt is here for initial consult of breast cancer    DIAGNOSIS: The encounter diagnosis was Primary cancer of right breast ([place]).   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 She recently presented with a self discovered mass in the subareolar region of her right breast.mammogram canconfirmed a solid mass with indeterminate appearance in the right breast at 12:00 position in the subareolar location.this was confirmed on ultrasound with no abnormality in the axilla. Should 1 core biopsy which was positive foratypical papillary neoplasm with calcifications. She went on to have a wide local excision with sparing of the nipple areolar complex.pathology was positive fora 1.2 cm area of ductal carcinoma in situ with margins clear at least 1 centimeter .there was involvement of a pre-existing papilloma and adjacent ducts. No lymph nodes were submitted.umor was strongly ER/PR positive. Present time she is doing well still somewhat sore from her surgery although healing nicely. She is now referred to radiation oncology for opinion.  PLANNED TREATMENT REGIMEN: hypofractionated right breast radiation  PAST MEDICAL HISTORY:  has a past medical history of Afib ([place]), Arthritis, Breast cancer ([place]) (2006), Depression, GERD (gastroesophageal reflux disease), Hypertension, Insomnia, Insomnia, and Personal  history of radiation therapy (2006).    PAST SURGICAL HISTORY:  Past Surgical History:  Procedure Laterality Date  . BREAST LUMPECTOMY WITH NEEDLE LOCALIZATION Right 07/09/2017   Procedure: BREAST LUMPECTOMY WITH NEEDLE LOCALIZATION;  Surgeon: Herbert Pun, MD;  Location: [facility] ORS;  Service: General;  Laterality: Right;  . BREAST SURGERY     left breast lumpectomy  . CARDIOVERSION N/A 03/21/2016   Procedure: Cardioversion;  Surgeon: Corey Skains, MD;  Location: [facility] ORS;  Service: Cardiovascular;  Laterality: N/A;  . CATARACT EXTRACTION, BILATERAL    . LAPAROSCOPIC CHOLECYSTECTOMY    . TONSILLECTOMY    . VAGINAL HYSTERECTOMY      FAMILY HISTORY: family history includes Diabetes in her sister.  SOCIAL HISTORY:  reports that she has never smoked. She has never used smokeless tobacco. She reports that she does not drink alcohol or use drugs.  ALLERGIES: Patient has no known allergies.  MEDICATIONS:  Current Outpatient Medications  Medication Sig Dispense Refill  . acetaminophen (TYLENOL) 500 MG tablet Take 1,000 mg by mouth 2 (two) times daily as needed (for pain.).    Marland Kitchen apixaban (ELIQUIS) 5 MG TABS tablet Take 5 mg by mouth 2 (two) times daily.    . diphenhydramine-acetaminophen (TYLENOL PM) 25-500 MG TABS tablet Take 1-2 tablets by mouth at bedtime as needed (FOR SLEEP.).    Marland Kitchen estradiol (ESTRACE) 0.1 MG/GM vaginal cream Place 1 Applicatorful vaginally at bedtime as needed (FOR VAGINAL DRYNESS.).    Marland Kitchen hydrochlorothiazide (MICROZIDE) 12.5 MG capsule Take 1 capsule (12.5 mg total) by mouth daily. 30 capsule 6  . HYDROcodone-acetaminophen (NORCO/VICODIN) 5-325 MG tablet Take 1 tablet by mouth every 4 (four) hours as needed. for pain  0  . Melatonin 10 MG TABS Take 10 mg by mouth at bedtime.    . metoprolol succinate (TOPROL-XL) 100 MG 24 hr tablet Take 1 tablet (100 mg total) by mouth daily. Take with or immediately following a meal. 30 tablet 6  . Multiple Vitamins-Minerals  (MULTIVITAMIN WITH MINERALS) tablet Take 1 tablet by mouth daily.    Marland Kitchen nystatin (MYCOSTATIN) 100000 UNIT/ML suspension Take 5 mLs by mouth 4 (four) times daily as needed (for mouth sores).    . Omega-3 1000 MG CAPS Take 1 capsule (1,000 mg total) by mouth daily. 90 capsule 3  . pravastatin (PRAVACHOL) 20 MG tablet Take 1 tablet (20 mg total) by mouth daily. (Patient taking differently: Take 20 mg by mouth at bedtime. ) 30 tablet 6  . sertraline (ZOLOFT) 100 MG tablet TAKE 1 TABLET BY MOUTH EVERY DAY 90 tablet 0  . Sulfacetamide Sodium (BLEPH-10 OP) Place 1 drop into both eyes every 3 (three) hours as needed (for dry eyes.).    Marland Kitchen omeprazole (PRILOSEC) 40 MG capsule Take 1 capsule (40 mg total) by mouth 2 (two) times daily. 60 capsule 11   No current facility-administered medications for this encounter.     ECOG PERFORMANCE STATUS:  0 - Asymptomatic  REVIEW OF SYSTEMS:  Patient denies any weight loss, fatigue, weakness, fever, chills or night sweats. Patient denies any loss of vision, blurred vision. Patient denies any ringing  of the ears or hearing loss. No irregular heartbeat. Patient denies heart murmur or history of fainting. Patient denies any chest pain or pain radiating to her upper extremities. Patient denies any shortness of breath, difficulty breathing at night, cough or hemoptysis. Patient denies any swelling in the lower legs. Patient denies any nausea vomiting, vomiting of blood, or coffee ground material in the vomitus. Patient denies any stomach pain. Patient states has had normal bowel movements no significant constipation or diarrhea. Patient denies any dysuria, hematuria or significant nocturia. Patient denies any problems walking, swelling in the joints or loss of balance. Patient denies any skin changes, loss of hair or loss of weight. Patient denies any excessive worrying or anxiety or significant depression. Patient denies any problems with insomnia. Patient denies excessive  thirst, polyuria, polydipsia. Patient denies any swollen glands, patient denies easy bruising or easy bleeding. Patient denies any recent infections, allergies or URI. Patient "s visual fields have not changed significantly in recent time.    PHYSICAL EXAM: BP 137/79   Pulse (!) 58   Temp 98.1 F (36.7 C)   Resp 18   Wt 242 lb 6.3 oz (109.9 kg)   BMI 40.34 kg/m  Right breast has a recent incision in the 12:00 position in the perineal a complex. Incision is healing well. No dominant mass or nodularity is noted in either breast in 2 positions examined. No axillary or supraclavicular adenopathy is appreciated. Well-developed well-nourished patient in NAD. HEENT reveals PERLA, EOMI, discs not visualized.  Oral cavity is clear. No oral mucosal lesions are identified. Neck is clear without evidence of cervical or supraclavicular adenopathy. Lungs are clear to A&P. Cardiac examination is essentially unremarkable with regular rate and rhythm without murmur rub or thrill. Abdomen is benign with no organomegaly or masses noted. Motor sensory and DTR levels are equal and symmetric in the upper and lower extremities. Cranial nerves II through XII are grossly intact. Proprioception is intact. No peripheral adenopathy or edema is identified. No motor or sensory levels are noted. Crude visual fields are within normal  range.  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  PLAN: at this time believe patient be candidate for hyperfractionated radiation therapy to her right breast over 4 weeks. Would also boost her scar another 1400 cGy using electron beam. Risks and benefits of treatment including skin reaction fatigue alteration of blood counts  possible inclusion of superficial lung all were discussed in detail with the patient. She seems to comprehend my treatment plan well. I have personally scheduled and ordered CT simulation in about 2 weeks to allow healing. Patient also will be candidate for antiestrogen therapy after completion of radiation.  I would like to take this opportunity to thank you for allowing me to participate in the care of your patient.Noreene Filbert, MD

## 2017-07-22 NOTE — Patient Instructions (Signed)
Letrozole tablets What is this medicine? LETROZOLE (LET roe zole) blocks the production of estrogen. It is used to treat breast cancer. This medicine may be used for other purposes; ask your health care provider or pharmacist if you have questions. COMMON BRAND NAME(S): Femara What should I tell my health care provider before I take this medicine? They need to know if you have any of these conditions: -high cholesterol -liver disease -osteoporosis (weak bones) -an unusual or allergic reaction to letrozole, other medicines, foods, dyes, or preservatives -pregnant or trying to get pregnant -breast-feeding How should I use this medicine? Take this medicine by mouth with a glass of water. You may take it with or without food. Follow the directions on the prescription label. Take your medicine at regular intervals. Do not take your medicine more often than directed. Do not stop taking except on your doctor's advice. Talk to your pediatrician regarding the use of this medicine in children. Special care may be needed. Overdosage: If you think you have taken too much of this medicine contact a poison control center or emergency room at once. NOTE: This medicine is only for you. Do not share this medicine with others. What if I miss a dose? If you miss a dose, take it as soon as you can. If it is almost time for your next dose, take only that dose. Do not take double or extra doses. What may interact with this medicine? Do not take this medicine with any of the following medications: -estrogens, like hormone replacement therapy or birth control pills This medicine may also interact with the following medications: -dietary supplements such as androstenedione or DHEA -prasterone -tamoxifen This list may not describe all possible interactions. Give your health care provider a list of all the medicines, herbs, non-prescription drugs, or dietary supplements you use. Also tell them if you smoke, drink  alcohol, or use illegal drugs. Some items may interact with your medicine. What should I watch for while using this medicine? Tell your doctor or healthcare professional if your symptoms do not start to get better or if they get worse. Do not become pregnant while taking this medicine or for 3 weeks after stopping it. Women should inform their doctor if they wish to become pregnant or think they might be pregnant. There is a potential for serious side effects to an unborn child. Talk to your health care professional or pharmacist for more information. Do not breast-feed while taking this medicine or for 3 weeks after stopping it. This medicine may interfere with the ability to have a child. Talk with your doctor or health care professional if you are concerned about your fertility. Using this medicine for a long time may increase your risk of low bone mass. Talk to your doctor about bone health. You may get drowsy or dizzy. Do not drive, use machinery, or do anything that needs mental alertness until you know how this medicine affects you. Do not stand or sit up quickly, especially if you are an older patient. This reduces the risk of dizzy or fainting spells. You may need blood work done while you are taking this medicine. What side effects may I notice from receiving this medicine? Side effects that you should report to your doctor or health care professional as soon as possible: -allergic reactions like skin rash, itching, or hives -bone fracture -chest pain -signs and symptoms of a blood clot such as breathing problems; changes in vision; chest pain; severe, sudden headache; pain, swelling,   warmth in the leg; trouble speaking; sudden numbness or weakness of the face, arm or leg -vaginal bleeding Side effects that usually do not require medical attention (report to your doctor or health care professional if they continue or are bothersome): -bone, back, joint, or muscle  pain -dizziness -fatigue -fluid retention -headache -hot flashes, night sweats -nausea -weight gain This list may not describe all possible side effects. Call your doctor for medical advice about side effects. You may report side effects to FDA at 1-800-FDA-1088. Where should I keep my medicine? Keep out of the reach of children. Store between 15 and 30 degrees C (59 and 86 degrees F). Throw away any unused medicine after the expiration date. NOTE: This sheet is a summary. It may not cover all possible information. If you have questions about this medicine, talk to your doctor, pharmacist, or health care provider.  2018 Elsevier/Gold Standard (2015-08-14 11:10:41)  

## 2017-07-22 NOTE — Progress Notes (Signed)
  Oncology Nurse Navigator Documentation  Navigator Location: [facility] (07/22/17 1500) Referral date to RadOnc/MedOnc: 07/22/17 (07/22/17 1500) )Navigator Encounter Type: Initial MedOnc;Initial RadOnc (07/22/17 1500)                   Treatment Initiated Date: 07/09/17 (07/22/17 1500)     Barriers/Navigation Needs: Education (07/22/17 1500) Education: Newly Diagnosed Cancer Education (07/22/17 1500) Interventions: Education (07/22/17 1500)     Education Method: Verbal;Written (07/22/17 1500)  Support Groups/Services: Breast Support Group (07/22/17 1500)             Time Spent with Patient: 2 (07/22/17 1500)   Met patient and her friend Lattie Haw during her consult with Dr. Baruch Gouty.  Patient has a history or left breast cancer in 2006.  States she was treated with radiation therapy and antihormonal therapy only.  Plan for 5 weeks of external beam radiation with antihormonal therapy.  She is to call if she has any questions or needs.

## 2017-07-26 ENCOUNTER — Other Ambulatory Visit: Payer: Self-pay | Admitting: Family Medicine

## 2017-08-04 ENCOUNTER — Ambulatory Visit
Admission: RE | Admit: 2017-08-04 | Discharge: 2017-08-04 | Disposition: A | Discharge status: Home / Self Care | Payer: Medicare Other | Source: Ambulatory Visit | Attending: Hematology and Oncology | Admitting: Hematology and Oncology

## 2017-08-04 DIAGNOSIS — M8580 Other specified disorders of bone density and structure, unspecified site: Secondary | ICD-10-CM | POA: Insufficient documentation

## 2017-08-04 DIAGNOSIS — D0511 Intraductal carcinoma in situ of right breast: Secondary | ICD-10-CM | POA: Insufficient documentation

## 2017-08-04 DIAGNOSIS — Z13820 Encounter for screening for osteoporosis: Secondary | ICD-10-CM | POA: Diagnosis not present

## 2017-08-04 DIAGNOSIS — Z923 Personal history of irradiation: Secondary | ICD-10-CM | POA: Insufficient documentation

## 2017-08-04 DIAGNOSIS — Z780 Asymptomatic menopausal state: Secondary | ICD-10-CM | POA: Insufficient documentation

## 2017-08-04 DIAGNOSIS — M419 Scoliosis, unspecified: Secondary | ICD-10-CM | POA: Insufficient documentation

## 2017-08-04 DIAGNOSIS — M85851 Other specified disorders of bone density and structure, right thigh: Secondary | ICD-10-CM | POA: Diagnosis not present

## 2017-08-04 DIAGNOSIS — Z853 Personal history of malignant neoplasm of breast: Secondary | ICD-10-CM | POA: Insufficient documentation

## 2017-08-04 DIAGNOSIS — C50919 Malignant neoplasm of unspecified site of unspecified female breast: Secondary | ICD-10-CM | POA: Insufficient documentation

## 2017-08-07 ENCOUNTER — Ambulatory Visit
Admission: RE | Admit: 2017-08-07 | Discharge: 2017-08-07 | Disposition: A | Discharge status: Home / Self Care | Payer: Medicare Other | Source: Ambulatory Visit | Attending: Radiation Oncology | Admitting: Radiation Oncology

## 2017-08-07 DIAGNOSIS — Z510 Encounter for antineoplastic radiation therapy: Secondary | ICD-10-CM | POA: Diagnosis not present

## 2017-08-07 DIAGNOSIS — D0511 Intraductal carcinoma in situ of right breast: Secondary | ICD-10-CM | POA: Insufficient documentation

## 2017-08-07 DIAGNOSIS — Z170 Estrogen receptor positive status [ER+]: Secondary | ICD-10-CM | POA: Diagnosis not present

## 2017-08-08 DIAGNOSIS — Z170 Estrogen receptor positive status [ER+]: Secondary | ICD-10-CM | POA: Diagnosis not present

## 2017-08-08 DIAGNOSIS — Z510 Encounter for antineoplastic radiation therapy: Secondary | ICD-10-CM | POA: Diagnosis not present

## 2017-08-08 DIAGNOSIS — D0511 Intraductal carcinoma in situ of right breast: Secondary | ICD-10-CM | POA: Diagnosis not present

## 2017-08-11 ENCOUNTER — Other Ambulatory Visit: Payer: Self-pay | Admitting: *Deleted

## 2017-08-11 DIAGNOSIS — C50911 Malignant neoplasm of unspecified site of right female breast: Secondary | ICD-10-CM

## 2017-08-14 ENCOUNTER — Ambulatory Visit
Admission: RE | Admit: 2017-08-14 | Discharge: 2017-08-14 | Disposition: A | Discharge status: Home / Self Care | Payer: Medicare Other | Source: Ambulatory Visit | Attending: Radiation Oncology | Admitting: Radiation Oncology

## 2017-08-14 DIAGNOSIS — D0511 Intraductal carcinoma in situ of right breast: Secondary | ICD-10-CM | POA: Diagnosis not present

## 2017-08-14 DIAGNOSIS — Z170 Estrogen receptor positive status [ER+]: Secondary | ICD-10-CM | POA: Diagnosis not present

## 2017-08-14 DIAGNOSIS — Z510 Encounter for antineoplastic radiation therapy: Secondary | ICD-10-CM | POA: Diagnosis not present

## 2017-08-16 ENCOUNTER — Other Ambulatory Visit: Payer: Self-pay | Admitting: Family Medicine

## 2017-08-16 DIAGNOSIS — I10 Essential (primary) hypertension: Secondary | ICD-10-CM

## 2017-08-18 ENCOUNTER — Ambulatory Visit
Admission: RE | Admit: 2017-08-18 | Discharge: 2017-08-18 | Disposition: A | Discharge status: Home / Self Care | Payer: Medicare Other | Source: Ambulatory Visit | Attending: Radiation Oncology | Admitting: Radiation Oncology

## 2017-08-18 DIAGNOSIS — Z170 Estrogen receptor positive status [ER+]: Secondary | ICD-10-CM | POA: Diagnosis not present

## 2017-08-18 DIAGNOSIS — Z510 Encounter for antineoplastic radiation therapy: Secondary | ICD-10-CM | POA: Diagnosis not present

## 2017-08-18 DIAGNOSIS — D0511 Intraductal carcinoma in situ of right breast: Secondary | ICD-10-CM | POA: Diagnosis not present

## 2017-08-19 ENCOUNTER — Ambulatory Visit
Admission: RE | Admit: 2017-08-19 | Discharge: 2017-08-19 | Disposition: A | Discharge status: Home / Self Care | Payer: Medicare Other | Source: Ambulatory Visit | Attending: Radiation Oncology | Admitting: Radiation Oncology

## 2017-08-19 DIAGNOSIS — Z510 Encounter for antineoplastic radiation therapy: Secondary | ICD-10-CM | POA: Diagnosis not present

## 2017-08-19 DIAGNOSIS — D0511 Intraductal carcinoma in situ of right breast: Secondary | ICD-10-CM | POA: Diagnosis not present

## 2017-08-19 DIAGNOSIS — Z170 Estrogen receptor positive status [ER+]: Secondary | ICD-10-CM | POA: Diagnosis not present

## 2017-08-20 ENCOUNTER — Ambulatory Visit
Admission: RE | Admit: 2017-08-20 | Discharge: 2017-08-20 | Disposition: A | Discharge status: Home / Self Care | Payer: Medicare Other | Source: Ambulatory Visit | Attending: Radiation Oncology | Admitting: Radiation Oncology

## 2017-08-20 ENCOUNTER — Other Ambulatory Visit: Payer: Self-pay | Admitting: Family Medicine

## 2017-08-20 DIAGNOSIS — Z510 Encounter for antineoplastic radiation therapy: Secondary | ICD-10-CM | POA: Diagnosis not present

## 2017-08-20 DIAGNOSIS — I10 Essential (primary) hypertension: Secondary | ICD-10-CM

## 2017-08-20 DIAGNOSIS — D0511 Intraductal carcinoma in situ of right breast: Secondary | ICD-10-CM | POA: Diagnosis not present

## 2017-08-20 DIAGNOSIS — Z170 Estrogen receptor positive status [ER+]: Secondary | ICD-10-CM | POA: Diagnosis not present

## 2017-08-21 ENCOUNTER — Ambulatory Visit
Admission: RE | Admit: 2017-08-21 | Discharge: 2017-08-21 | Disposition: A | Discharge status: Home / Self Care | Payer: Medicare Other | Source: Ambulatory Visit | Attending: Radiation Oncology | Admitting: Radiation Oncology

## 2017-08-21 DIAGNOSIS — D0511 Intraductal carcinoma in situ of right breast: Secondary | ICD-10-CM | POA: Diagnosis not present

## 2017-08-21 DIAGNOSIS — D0512 Intraductal carcinoma in situ of left breast: Secondary | ICD-10-CM | POA: Diagnosis not present

## 2017-08-21 DIAGNOSIS — Z170 Estrogen receptor positive status [ER+]: Secondary | ICD-10-CM | POA: Diagnosis not present

## 2017-08-21 DIAGNOSIS — Z510 Encounter for antineoplastic radiation therapy: Secondary | ICD-10-CM | POA: Diagnosis not present

## 2017-08-22 ENCOUNTER — Ambulatory Visit
Admission: RE | Admit: 2017-08-22 | Discharge: 2017-08-22 | Disposition: A | Discharge status: Home / Self Care | Payer: Medicare Other | Source: Ambulatory Visit | Attending: Radiation Oncology | Admitting: Radiation Oncology

## 2017-08-22 DIAGNOSIS — D0511 Intraductal carcinoma in situ of right breast: Secondary | ICD-10-CM | POA: Diagnosis not present

## 2017-08-22 DIAGNOSIS — Z170 Estrogen receptor positive status [ER+]: Secondary | ICD-10-CM | POA: Diagnosis not present

## 2017-08-22 DIAGNOSIS — D0512 Intraductal carcinoma in situ of left breast: Secondary | ICD-10-CM | POA: Diagnosis not present

## 2017-08-22 DIAGNOSIS — Z510 Encounter for antineoplastic radiation therapy: Secondary | ICD-10-CM | POA: Diagnosis not present

## 2017-08-24 ENCOUNTER — Other Ambulatory Visit: Payer: Self-pay | Admitting: Family Medicine

## 2017-08-24 DIAGNOSIS — F339 Major depressive disorder, recurrent, unspecified: Secondary | ICD-10-CM

## 2017-08-24 DIAGNOSIS — F32A Depression, unspecified: Secondary | ICD-10-CM

## 2017-08-24 DIAGNOSIS — F329 Major depressive disorder, single episode, unspecified: Secondary | ICD-10-CM

## 2017-08-25 ENCOUNTER — Ambulatory Visit
Admission: RE | Admit: 2017-08-25 | Discharge: 2017-08-25 | Disposition: A | Discharge status: Home / Self Care | Payer: Medicare Other | Source: Ambulatory Visit | Attending: Radiation Oncology | Admitting: Radiation Oncology

## 2017-08-25 DIAGNOSIS — Z170 Estrogen receptor positive status [ER+]: Secondary | ICD-10-CM | POA: Diagnosis not present

## 2017-08-25 DIAGNOSIS — Z510 Encounter for antineoplastic radiation therapy: Secondary | ICD-10-CM | POA: Diagnosis not present

## 2017-08-25 DIAGNOSIS — D0511 Intraductal carcinoma in situ of right breast: Secondary | ICD-10-CM | POA: Diagnosis not present

## 2017-08-25 DIAGNOSIS — D0512 Intraductal carcinoma in situ of left breast: Secondary | ICD-10-CM | POA: Diagnosis not present

## 2017-08-26 ENCOUNTER — Ambulatory Visit
Admission: RE | Admit: 2017-08-26 | Discharge: 2017-08-26 | Disposition: A | Discharge status: Home / Self Care | Payer: Medicare Other | Source: Ambulatory Visit | Attending: Radiation Oncology | Admitting: Radiation Oncology

## 2017-08-26 DIAGNOSIS — Z510 Encounter for antineoplastic radiation therapy: Secondary | ICD-10-CM | POA: Diagnosis not present

## 2017-08-26 DIAGNOSIS — D0512 Intraductal carcinoma in situ of left breast: Secondary | ICD-10-CM | POA: Diagnosis not present

## 2017-08-26 DIAGNOSIS — D0511 Intraductal carcinoma in situ of right breast: Secondary | ICD-10-CM | POA: Diagnosis not present

## 2017-08-26 DIAGNOSIS — Z170 Estrogen receptor positive status [ER+]: Secondary | ICD-10-CM | POA: Diagnosis not present

## 2017-08-27 ENCOUNTER — Ambulatory Visit: Payer: Medicare Other

## 2017-08-27 ENCOUNTER — Inpatient Hospital Stay: Payer: Medicare Other | Attending: Oncology

## 2017-08-27 DIAGNOSIS — M8588 Other specified disorders of bone density and structure, other site: Secondary | ICD-10-CM | POA: Insufficient documentation

## 2017-08-27 DIAGNOSIS — D0511 Intraductal carcinoma in situ of right breast: Secondary | ICD-10-CM | POA: Insufficient documentation

## 2017-08-27 DIAGNOSIS — F329 Major depressive disorder, single episode, unspecified: Secondary | ICD-10-CM | POA: Insufficient documentation

## 2017-08-27 DIAGNOSIS — I10 Essential (primary) hypertension: Secondary | ICD-10-CM | POA: Insufficient documentation

## 2017-08-27 DIAGNOSIS — Z170 Estrogen receptor positive status [ER+]: Secondary | ICD-10-CM | POA: Insufficient documentation

## 2017-08-28 ENCOUNTER — Ambulatory Visit
Admission: RE | Admit: 2017-08-28 | Discharge: 2017-08-28 | Disposition: A | Discharge status: Home / Self Care | Payer: Medicare Other | Source: Ambulatory Visit | Attending: Radiation Oncology | Admitting: Radiation Oncology

## 2017-08-28 DIAGNOSIS — Z510 Encounter for antineoplastic radiation therapy: Secondary | ICD-10-CM | POA: Diagnosis not present

## 2017-08-28 DIAGNOSIS — D0512 Intraductal carcinoma in situ of left breast: Secondary | ICD-10-CM | POA: Diagnosis not present

## 2017-08-28 DIAGNOSIS — D0511 Intraductal carcinoma in situ of right breast: Secondary | ICD-10-CM | POA: Diagnosis not present

## 2017-08-28 DIAGNOSIS — Z170 Estrogen receptor positive status [ER+]: Secondary | ICD-10-CM | POA: Diagnosis not present

## 2017-08-29 ENCOUNTER — Ambulatory Visit
Admission: RE | Admit: 2017-08-29 | Discharge: 2017-08-29 | Disposition: A | Discharge status: Home / Self Care | Payer: Medicare Other | Source: Ambulatory Visit | Attending: Radiation Oncology | Admitting: Radiation Oncology

## 2017-08-29 DIAGNOSIS — Z510 Encounter for antineoplastic radiation therapy: Secondary | ICD-10-CM | POA: Diagnosis not present

## 2017-08-29 DIAGNOSIS — D0512 Intraductal carcinoma in situ of left breast: Secondary | ICD-10-CM | POA: Diagnosis not present

## 2017-08-29 DIAGNOSIS — Z170 Estrogen receptor positive status [ER+]: Secondary | ICD-10-CM | POA: Diagnosis not present

## 2017-08-29 DIAGNOSIS — D0511 Intraductal carcinoma in situ of right breast: Secondary | ICD-10-CM | POA: Diagnosis not present

## 2017-09-01 ENCOUNTER — Ambulatory Visit
Admission: RE | Admit: 2017-09-01 | Discharge: 2017-09-01 | Disposition: A | Discharge status: Home / Self Care | Payer: Medicare Other | Source: Ambulatory Visit | Attending: Radiation Oncology | Admitting: Radiation Oncology

## 2017-09-01 DIAGNOSIS — D0511 Intraductal carcinoma in situ of right breast: Secondary | ICD-10-CM | POA: Diagnosis not present

## 2017-09-01 DIAGNOSIS — Z170 Estrogen receptor positive status [ER+]: Secondary | ICD-10-CM | POA: Diagnosis not present

## 2017-09-01 DIAGNOSIS — Z510 Encounter for antineoplastic radiation therapy: Secondary | ICD-10-CM | POA: Diagnosis not present

## 2017-09-01 DIAGNOSIS — D0512 Intraductal carcinoma in situ of left breast: Secondary | ICD-10-CM | POA: Diagnosis not present

## 2017-09-02 ENCOUNTER — Inpatient Hospital Stay: Payer: Medicare Other

## 2017-09-02 ENCOUNTER — Ambulatory Visit
Admission: RE | Admit: 2017-09-02 | Discharge: 2017-09-02 | Disposition: A | Discharge status: Home / Self Care | Payer: Medicare Other | Source: Ambulatory Visit | Attending: Radiation Oncology | Admitting: Radiation Oncology

## 2017-09-02 DIAGNOSIS — Z510 Encounter for antineoplastic radiation therapy: Secondary | ICD-10-CM | POA: Diagnosis not present

## 2017-09-02 DIAGNOSIS — M8588 Other specified disorders of bone density and structure, other site: Secondary | ICD-10-CM | POA: Diagnosis not present

## 2017-09-02 DIAGNOSIS — F329 Major depressive disorder, single episode, unspecified: Secondary | ICD-10-CM | POA: Diagnosis not present

## 2017-09-02 DIAGNOSIS — I10 Essential (primary) hypertension: Secondary | ICD-10-CM | POA: Diagnosis not present

## 2017-09-02 DIAGNOSIS — D0512 Intraductal carcinoma in situ of left breast: Secondary | ICD-10-CM | POA: Diagnosis not present

## 2017-09-02 DIAGNOSIS — D0511 Intraductal carcinoma in situ of right breast: Secondary | ICD-10-CM | POA: Diagnosis not present

## 2017-09-02 DIAGNOSIS — Z170 Estrogen receptor positive status [ER+]: Secondary | ICD-10-CM | POA: Diagnosis not present

## 2017-09-02 DIAGNOSIS — C50911 Malignant neoplasm of unspecified site of right female breast: Secondary | ICD-10-CM

## 2017-09-02 LAB — CBC
HCT: 40.5 % (ref 35.0–47.0)
Hemoglobin: 13.8 g/dL (ref 12.0–16.0)
MCH: 31.6 pg (ref 26.0–34.0)
MCHC: 33.9 g/dL (ref 32.0–36.0)
MCV: 93.2 fL (ref 80.0–100.0)
Platelets: 170 10*3/uL (ref 150–440)
RBC: 4.35 MIL/uL (ref 3.80–5.20)
RDW: 13.2 % (ref 11.5–14.5)
WBC: 4.2 10*3/uL (ref 3.6–11.0)

## 2017-09-03 ENCOUNTER — Ambulatory Visit
Admission: RE | Admit: 2017-09-03 | Discharge: 2017-09-03 | Disposition: A | Discharge status: Home / Self Care | Payer: Medicare Other | Source: Ambulatory Visit | Attending: Radiation Oncology | Admitting: Radiation Oncology

## 2017-09-03 DIAGNOSIS — D0511 Intraductal carcinoma in situ of right breast: Secondary | ICD-10-CM | POA: Diagnosis not present

## 2017-09-03 DIAGNOSIS — Z510 Encounter for antineoplastic radiation therapy: Secondary | ICD-10-CM | POA: Diagnosis not present

## 2017-09-03 DIAGNOSIS — Z170 Estrogen receptor positive status [ER+]: Secondary | ICD-10-CM | POA: Diagnosis not present

## 2017-09-03 DIAGNOSIS — D0512 Intraductal carcinoma in situ of left breast: Secondary | ICD-10-CM | POA: Diagnosis not present

## 2017-09-04 ENCOUNTER — Ambulatory Visit: Payer: Medicare Other

## 2017-09-05 ENCOUNTER — Ambulatory Visit: Payer: Medicare Other

## 2017-09-07 ENCOUNTER — Ambulatory Visit: Admission: RE | Admit: 2017-09-07 | Payer: Medicare Other | Source: Ambulatory Visit

## 2017-09-08 ENCOUNTER — Ambulatory Visit: Payer: Medicare Other

## 2017-09-08 ENCOUNTER — Ambulatory Visit
Admission: RE | Admit: 2017-09-08 | Discharge: 2017-09-08 | Disposition: A | Discharge status: Home / Self Care | Payer: Medicare Other | Source: Ambulatory Visit | Attending: Radiation Oncology | Admitting: Radiation Oncology

## 2017-09-08 DIAGNOSIS — D0511 Intraductal carcinoma in situ of right breast: Secondary | ICD-10-CM | POA: Diagnosis not present

## 2017-09-08 DIAGNOSIS — Z170 Estrogen receptor positive status [ER+]: Secondary | ICD-10-CM | POA: Diagnosis not present

## 2017-09-08 DIAGNOSIS — Z510 Encounter for antineoplastic radiation therapy: Secondary | ICD-10-CM | POA: Diagnosis not present

## 2017-09-08 DIAGNOSIS — D0512 Intraductal carcinoma in situ of left breast: Secondary | ICD-10-CM | POA: Diagnosis not present

## 2017-09-09 ENCOUNTER — Ambulatory Visit: Payer: Medicare Other

## 2017-09-09 ENCOUNTER — Ambulatory Visit
Admission: RE | Admit: 2017-09-09 | Discharge: 2017-09-09 | Disposition: A | Discharge status: Home / Self Care | Payer: Medicare Other | Source: Ambulatory Visit | Attending: Radiation Oncology | Admitting: Radiation Oncology

## 2017-09-09 DIAGNOSIS — D0512 Intraductal carcinoma in situ of left breast: Secondary | ICD-10-CM | POA: Diagnosis not present

## 2017-09-09 DIAGNOSIS — Z510 Encounter for antineoplastic radiation therapy: Secondary | ICD-10-CM | POA: Diagnosis not present

## 2017-09-09 DIAGNOSIS — Z170 Estrogen receptor positive status [ER+]: Secondary | ICD-10-CM | POA: Diagnosis not present

## 2017-09-09 DIAGNOSIS — D0511 Intraductal carcinoma in situ of right breast: Secondary | ICD-10-CM | POA: Diagnosis not present

## 2017-09-10 ENCOUNTER — Inpatient Hospital Stay: Payer: Medicare Other

## 2017-09-10 ENCOUNTER — Ambulatory Visit: Payer: Medicare Other

## 2017-09-10 ENCOUNTER — Ambulatory Visit
Admission: RE | Admit: 2017-09-10 | Discharge: 2017-09-10 | Disposition: A | Discharge status: Home / Self Care | Payer: Medicare Other | Source: Ambulatory Visit | Attending: Radiation Oncology | Admitting: Radiation Oncology

## 2017-09-10 DIAGNOSIS — C50911 Malignant neoplasm of unspecified site of right female breast: Secondary | ICD-10-CM

## 2017-09-10 DIAGNOSIS — F329 Major depressive disorder, single episode, unspecified: Secondary | ICD-10-CM | POA: Diagnosis not present

## 2017-09-10 DIAGNOSIS — Z510 Encounter for antineoplastic radiation therapy: Secondary | ICD-10-CM | POA: Diagnosis not present

## 2017-09-10 DIAGNOSIS — D0511 Intraductal carcinoma in situ of right breast: Secondary | ICD-10-CM | POA: Diagnosis not present

## 2017-09-10 DIAGNOSIS — M8588 Other specified disorders of bone density and structure, other site: Secondary | ICD-10-CM | POA: Diagnosis not present

## 2017-09-10 DIAGNOSIS — Z170 Estrogen receptor positive status [ER+]: Secondary | ICD-10-CM | POA: Diagnosis not present

## 2017-09-10 DIAGNOSIS — I10 Essential (primary) hypertension: Secondary | ICD-10-CM | POA: Diagnosis not present

## 2017-09-10 DIAGNOSIS — D0512 Intraductal carcinoma in situ of left breast: Secondary | ICD-10-CM | POA: Diagnosis not present

## 2017-09-10 LAB — CBC
HCT: 40.1 % (ref 35.0–47.0)
Hemoglobin: 13.7 g/dL (ref 12.0–16.0)
MCH: 31.5 pg (ref 26.0–34.0)
MCHC: 34.1 g/dL (ref 32.0–36.0)
MCV: 92.4 fL (ref 80.0–100.0)
Platelets: 169 10*3/uL (ref 150–440)
RBC: 4.34 MIL/uL (ref 3.80–5.20)
RDW: 13.1 % (ref 11.5–14.5)
WBC: 4.4 10*3/uL (ref 3.6–11.0)

## 2017-09-11 ENCOUNTER — Ambulatory Visit
Admission: RE | Admit: 2017-09-11 | Discharge: 2017-09-11 | Disposition: A | Discharge status: Home / Self Care | Payer: Medicare Other | Source: Ambulatory Visit | Attending: Radiation Oncology | Admitting: Radiation Oncology

## 2017-09-11 DIAGNOSIS — Z510 Encounter for antineoplastic radiation therapy: Secondary | ICD-10-CM | POA: Diagnosis not present

## 2017-09-11 DIAGNOSIS — D0512 Intraductal carcinoma in situ of left breast: Secondary | ICD-10-CM | POA: Diagnosis not present

## 2017-09-11 DIAGNOSIS — Z170 Estrogen receptor positive status [ER+]: Secondary | ICD-10-CM | POA: Diagnosis not present

## 2017-09-11 DIAGNOSIS — D0511 Intraductal carcinoma in situ of right breast: Secondary | ICD-10-CM | POA: Diagnosis not present

## 2017-09-12 ENCOUNTER — Ambulatory Visit
Admission: RE | Admit: 2017-09-12 | Discharge: 2017-09-12 | Disposition: A | Discharge status: Home / Self Care | Payer: Medicare Other | Source: Ambulatory Visit | Attending: Radiation Oncology | Admitting: Radiation Oncology

## 2017-09-12 DIAGNOSIS — Z510 Encounter for antineoplastic radiation therapy: Secondary | ICD-10-CM | POA: Diagnosis not present

## 2017-09-12 DIAGNOSIS — D0512 Intraductal carcinoma in situ of left breast: Secondary | ICD-10-CM | POA: Diagnosis not present

## 2017-09-15 ENCOUNTER — Ambulatory Visit
Admission: RE | Admit: 2017-09-15 | Discharge: 2017-09-15 | Disposition: A | Discharge status: Home / Self Care | Payer: Medicare Other | Source: Ambulatory Visit | Attending: Radiation Oncology | Admitting: Radiation Oncology

## 2017-09-15 DIAGNOSIS — D0512 Intraductal carcinoma in situ of left breast: Secondary | ICD-10-CM | POA: Diagnosis not present

## 2017-09-15 DIAGNOSIS — Z510 Encounter for antineoplastic radiation therapy: Secondary | ICD-10-CM | POA: Diagnosis not present

## 2017-09-16 ENCOUNTER — Other Ambulatory Visit: Payer: Self-pay | Admitting: *Deleted

## 2017-09-16 ENCOUNTER — Inpatient Hospital Stay (HOSPITAL_BASED_OUTPATIENT_CLINIC_OR_DEPARTMENT_OTHER): Payer: Medicare Other | Admitting: Oncology

## 2017-09-16 ENCOUNTER — Ambulatory Visit
Admission: RE | Admit: 2017-09-16 | Discharge: 2017-09-16 | Disposition: A | Discharge status: Home / Self Care | Payer: Medicare Other | Source: Ambulatory Visit | Attending: Radiation Oncology | Admitting: Radiation Oncology

## 2017-09-16 ENCOUNTER — Encounter: Payer: Self-pay | Admitting: Oncology

## 2017-09-16 VITALS — BP 124/78 | HR 55 | Temp 97.8°F | Resp 18 | Ht 65.5 in | Wt 241.0 lb

## 2017-09-16 DIAGNOSIS — F329 Major depressive disorder, single episode, unspecified: Secondary | ICD-10-CM | POA: Diagnosis not present

## 2017-09-16 DIAGNOSIS — M8588 Other specified disorders of bone density and structure, other site: Secondary | ICD-10-CM

## 2017-09-16 DIAGNOSIS — I10 Essential (primary) hypertension: Secondary | ICD-10-CM

## 2017-09-16 DIAGNOSIS — D0511 Intraductal carcinoma in situ of right breast: Secondary | ICD-10-CM

## 2017-09-16 DIAGNOSIS — Z170 Estrogen receptor positive status [ER+]: Secondary | ICD-10-CM | POA: Diagnosis not present

## 2017-09-16 DIAGNOSIS — Z7189 Other specified counseling: Secondary | ICD-10-CM

## 2017-09-16 DIAGNOSIS — Z510 Encounter for antineoplastic radiation therapy: Secondary | ICD-10-CM | POA: Diagnosis not present

## 2017-09-16 DIAGNOSIS — D0512 Intraductal carcinoma in situ of left breast: Secondary | ICD-10-CM | POA: Diagnosis not present

## 2017-09-16 MED ORDER — SILVER SULFADIAZINE 1 % EX CREA: 1.0000 "application " | TOPICAL_CREAM | Freq: Two times a day (BID) | CUTANEOUS | 2 refills | Status: DC

## 2017-09-16 MED ORDER — ANASTROZOLE 1 MG PO TABS: 1.0000 mg | ORAL_TABLET | Freq: Every day | ORAL | 3 refills | Status: DC

## 2017-09-16 NOTE — Progress Notes (Signed)
No new changes noted today 

## 2017-09-17 ENCOUNTER — Other Ambulatory Visit: Payer: Self-pay | Admitting: Family Medicine

## 2017-09-17 ENCOUNTER — Ambulatory Visit
Admission: RE | Admit: 2017-09-17 | Discharge: 2017-09-17 | Disposition: A | Discharge status: Home / Self Care | Payer: Medicare Other | Source: Ambulatory Visit | Attending: Radiation Oncology | Admitting: Radiation Oncology

## 2017-09-17 ENCOUNTER — Telehealth: Payer: Self-pay | Admitting: *Deleted

## 2017-09-17 DIAGNOSIS — Z170 Estrogen receptor positive status [ER+]: Secondary | ICD-10-CM | POA: Diagnosis not present

## 2017-09-17 DIAGNOSIS — D0512 Intraductal carcinoma in situ of left breast: Secondary | ICD-10-CM | POA: Diagnosis not present

## 2017-09-17 DIAGNOSIS — Z510 Encounter for antineoplastic radiation therapy: Secondary | ICD-10-CM | POA: Diagnosis not present

## 2017-09-17 DIAGNOSIS — D0511 Intraductal carcinoma in situ of right breast: Secondary | ICD-10-CM | POA: Diagnosis not present

## 2017-09-17 MED ORDER — ALENDRONATE SODIUM 70 MG PO TABS: 70.0000 mg | ORAL_TABLET | ORAL | 3 refills | Status: DC

## 2017-09-17 NOTE — Telephone Encounter (Signed)
Daughter called and states they were expecting another prescription to be sent in to pharmacy ending in max. Discussed with Judeen Hammans and she states she was to have sent in Fosamax for her and so it was sent and daughter informed

## 2017-09-18 ENCOUNTER — Ambulatory Visit
Admission: RE | Admit: 2017-09-18 | Discharge: 2017-09-18 | Disposition: A | Discharge status: Home / Self Care | Payer: Medicare Other | Source: Ambulatory Visit | Attending: Radiation Oncology | Admitting: Radiation Oncology

## 2017-09-18 ENCOUNTER — Ambulatory Visit: Payer: Medicare Other

## 2017-09-18 DIAGNOSIS — Z510 Encounter for antineoplastic radiation therapy: Secondary | ICD-10-CM | POA: Diagnosis not present

## 2017-09-18 DIAGNOSIS — D0511 Intraductal carcinoma in situ of right breast: Secondary | ICD-10-CM | POA: Insufficient documentation

## 2017-09-18 DIAGNOSIS — D0512 Intraductal carcinoma in situ of left breast: Secondary | ICD-10-CM | POA: Diagnosis not present

## 2017-09-18 DIAGNOSIS — Z7189 Other specified counseling: Secondary | ICD-10-CM | POA: Insufficient documentation

## 2017-09-18 NOTE — Progress Notes (Signed)
START ON PATHWAY REGIMEN - Breast     Daily:     Anastrozole   **Always confirm dose/schedule in your pharmacy ordering system**  Patient Characteristics: Postoperative without Neoadjuvant Therapy (Pathologic Staging), Carcinoma in situ (DCIS or [place]), ER Positive and Breast Conserving Surgery, Postmenopausal Therapeutic Status: Postoperative without Neoadjuvant Therapy (Pathologic Staging) AJCC Grade: GX AJCC N Category: pNX AJCC M Category: cM0 ER Status: Positive (+) AJCC 8 Stage Grouping: 0 HER2 Status: Negative (-) Oncotype Dx Recurrence Score: Not Appropriate AJCC T Category: pTX PR Status: Positive (+) Menopausal Status: Postmenopausal Intent of Therapy: Curative Intent, Discussed with Patient

## 2017-09-18 NOTE — Progress Notes (Signed)
Hematology/Oncology Consult note [facility]  Telephone:([phone] Fax:([phone]  Patient Care Team: Juline Patch, MD as PCP - General (Family Medicine)   Name of the patient: Melissa Chase  [id number]  19-Jan-1941   Date of visit: 09/18/17  Diagnosis-right breast DCIS status post lumpectomy  Chief complaint/ Reason for visit-discuss further management options for right breast DCIS  Heme/Onc history: Patient is a 77 year old female with a history of left breast DCIS in 2006.  At that time she underwent a lumpectomy followed by radiation treatment and was on tamoxifen for 5 years.  Subsequently she has been undergoing regular screening mammograms.  She self palpated a mass in the right subareolar region.  This was followed by a mammogram and ultrasound in May 2019 which revealed a solid 1.1 x 0.9 x 0.5 cm right breast mass.  No evidence of right axillary adenopathy.  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  Consideration includes DCIS involving a papilloma and encapsulated papillary carcinoma.  Patient underwent lumpectomy on 07/09/2017 by Dr. Peyton Najjar which showed a 12 mm grade 1-2 DCIS with involvement of pre-existing papilloma and adjacent ducts.  Lateral excision revealed atypical ductal hyperplasia.  Margins were negative.  Tumor was greater than ER 90% positive and greater than 90% PR positive.  Pathologic stage Tis NX.  Patient is currently undergoing adjuvant radiation therapy which she will be completing next week.  She reports some soreness in the region of the right breast but denies any complaints.  She was seen by Dr. Mike Gip in July 2019 to discuss adjuvant hormone treatment options.  She is currently not on any endocrine therapy.  Interval history-she is tolerating radiation treatment well and will be finishing it up next week.  She has chronic arthritis for which she sees rheumatology.  ECOG PS- 1 Pain  scale- 3 Opioid associated constipation- no  Review of systems- Review of Systems  Constitutional: Negative for chills, fever, malaise/fatigue and weight loss.  HENT: Negative for congestion, ear discharge and nosebleeds.   Eyes: Negative for blurred vision.  Respiratory: Negative for cough, hemoptysis, sputum production, shortness of breath and wheezing.   Cardiovascular: Negative for chest pain, palpitations, orthopnea and claudication.  Gastrointestinal: Negative for abdominal pain, blood in stool, constipation, diarrhea, heartburn, melena, nausea and vomiting.  Genitourinary: Negative for dysuria, flank pain, frequency, hematuria and urgency.  Musculoskeletal: Positive for joint pain. Negative for back pain and myalgias.  Skin: Negative for rash.  Neurological: Negative for dizziness, tingling, focal weakness, seizures, weakness and headaches.  Endo/Heme/Allergies: Does not bruise/bleed easily.  Psychiatric/Behavioral: Negative for depression and suicidal ideas. The patient does not have insomnia.      No Known Allergies   Past Medical History:  Diagnosis Date  . Afib ([place])   . Arthritis   . Breast cancer ([place]) 2006   left breast  . Depression   . GERD (gastroesophageal reflux disease)   . Hypertension   . Insomnia   . Insomnia   . Personal history of radiation therapy 2006   37 tx.     Past Surgical History:  Procedure Laterality Date  . BREAST LUMPECTOMY WITH NEEDLE LOCALIZATION Right 07/09/2017   Procedure: BREAST LUMPECTOMY WITH NEEDLE LOCALIZATION;  Surgeon: Herbert Pun, MD;  Location: [facility] ORS;  Service: General;  Laterality: Right;  . BREAST SURGERY     left breast lumpectomy  . CARDIOVERSION N/A 03/21/2016   Procedure: Cardioversion;  Surgeon: Corey Skains, MD;  Location: [facility]  ORS;  Service: Cardiovascular;  Laterality: N/A;  . CATARACT EXTRACTION, BILATERAL    . LAPAROSCOPIC CHOLECYSTECTOMY    . TONSILLECTOMY    . VAGINAL HYSTERECTOMY       Social History   Socioeconomic History  . Marital status: Widowed    Spouse name: Not on file  . Number of children: Not on file  . Years of education: Not on file  . Highest education level: Not on file  Occupational History  . Not on file  Social Needs  . Financial resource strain: Not on file  . Food insecurity:    Worry: Not on file    Inability: Not on file  . Transportation needs:    Medical: Not on file    Non-medical: Not on file  Tobacco Use  . Smoking status: Never Smoker  . Smokeless tobacco: Never Used  Substance and Sexual Activity  . Alcohol use: Never    Frequency: Never  . Drug use: No  . Sexual activity: Not Currently  Lifestyle  . Physical activity:    Days per week: Not on file    Minutes per session: Not on file  . Stress: Not on file  Relationships  . Social connections:    Talks on phone: Not on file    Gets together: Not on file    Attends religious service: Not on file    Active member of club or organization: Not on file    Attends meetings of clubs or organizations: Not on file    Relationship status: Not on file  . Intimate partner violence:    Fear of current or ex partner: Not on file    Emotionally abused: Not on file    Physically abused: Not on file    Forced sexual activity: Not on file  Other Topics Concern  . Not on file  Social History Narrative  . Not on file    Family History  Problem Relation Age of Onset  . Diabetes Sister      Current Outpatient Medications:  .  apixaban (ELIQUIS) 5 MG TABS tablet, Take 5 mg by mouth 2 (two) times daily., Disp: , Rfl:  .  hydrochlorothiazide (MICROZIDE) 12.5 MG capsule, TAKE 1 CAPSULE BY MOUTH EVERY DAY, Disp: 30 capsule, Rfl: 1 .  lisinopril (PRINIVIL,ZESTRIL) 5 MG tablet, Take 5 mg by mouth daily., Disp: , Rfl:  .  Melatonin 10 MG TABS, Take 10 mg by mouth at bedtime., Disp: , Rfl:  .  metoprolol succinate (TOPROL-XL) 100 MG 24 hr tablet, TAKE 1 TABLET (100 MG TOTAL) BY  MOUTH DAILY. TAKE WITH OR IMMEDIATELY FOLLOWING A MEAL., Disp: 30 tablet, Rfl: 4 .  Multiple Vitamins-Minerals (MULTIVITAMIN WITH MINERALS) tablet, Take 1 tablet by mouth daily., Disp: , Rfl:  .  nystatin (MYCOSTATIN) 100000 UNIT/ML suspension, Take 5 mLs by mouth 4 (four) times daily as needed (for mouth sores)., Disp: , Rfl:  .  Omega-3 1000 MG CAPS, Take 1 capsule (1,000 mg total) by mouth daily., Disp: 90 capsule, Rfl: 3 .  omeprazole (PRILOSEC) 40 MG capsule, Take 1 capsule (40 mg total) by mouth 2 (two) times daily., Disp: 60 capsule, Rfl: 11 .  pravastatin (PRAVACHOL) 20 MG tablet, Take 1 tablet (20 mg total) by mouth daily. (Patient taking differently: Take 20 mg by mouth at bedtime. ), Disp: 30 tablet, Rfl: 6 .  sertraline (ZOLOFT) 100 MG tablet, TAKE 1 TABLET BY MOUTH EVERY DAY, Disp: 90 tablet, Rfl: 0 .  acetaminophen (TYLENOL)  500 MG tablet, Take 1,000 mg by mouth 2 (two) times daily as needed (for pain.)., Disp: , Rfl:  .  alendronate (FOSAMAX) 70 MG tablet, Take 1 tablet (70 mg total) by mouth once a week. Take with a full glass of water on an empty stomach., Disp: 4 tablet, Rfl: 3 .  anastrozole (ARIMIDEX) 1 MG tablet, Take 1 tablet (1 mg total) by mouth daily., Disp: 30 tablet, Rfl: 3 .  diphenhydramine-acetaminophen (TYLENOL PM) 25-500 MG TABS tablet, Take 1-2 tablets by mouth at bedtime as needed (FOR SLEEP.)., Disp: , Rfl:  .  estradiol (ESTRACE) 0.1 MG/GM vaginal cream, Place 1 Applicatorful vaginally at bedtime as needed (FOR VAGINAL DRYNESS.)., Disp: , Rfl:  .  HYDROcodone-acetaminophen (NORCO/VICODIN) 5-325 MG tablet, Take 1 tablet by mouth every 4 (four) hours as needed. for pain, Disp: , Rfl: 0 .  omeprazole (PRILOSEC) 40 MG capsule, TAKE 1 CAPSULE BY MOUTH EVERY DAY (Patient not taking: Reported on 09/16/2017), Disp: 30 capsule, Rfl: 1 .  omeprazole (PRILOSEC) 40 MG capsule, TAKE 1 CAPSULE BY MOUTH EVERY DAY, Disp: 30 capsule, Rfl: 0 .  silver sulfADIAZINE (SILVADENE) 1 %  cream, Apply 1 application topically 2 (two) times daily. (Patient not taking: Reported on 09/16/2017), Disp: 50 g, Rfl: 2 .  Sulfacetamide Sodium (BLEPH-10 OP), Place 1 drop into both eyes every 3 (three) hours as needed (for dry eyes.)., Disp: , Rfl:   Physical exam:  Vitals:   09/16/17 1503  BP: 124/78  Pulse: (!) 55  Resp: 18  Temp: 97.8 F (36.6 C)  TempSrc: Tympanic  SpO2: 95%  Weight: 241 lb (109.3 kg)  Height: 5' 5.5" (1.664 m)   Physical Exam  Constitutional: She is oriented to person, place, and time.  Patient is obese.  Does not appear to be in any acute distress  HENT:  Head: Normocephalic and atraumatic.  Eyes: Pupils are equal, round, and reactive to light. EOM are normal.  Neck: Normal range of motion.  Cardiovascular: Normal rate, regular rhythm and normal heart sounds.  Pulmonary/Chest: Effort normal and breath sounds normal.  Abdominal: Soft. Bowel sounds are normal.  Neurological: She is alert and oriented to person, place, and time.  Skin: Skin is warm and dry.  There is erythema noted over the right chest wall.  Patient also has a superficial excoriation/dermatitis in the region of the right axilla which has a dressing in place.  CMP Latest Ref Rng & Units 07/30/2016  Glucose 65 - 99 mg/dL 85  BUN 8 - 27 mg/dL 14  Creatinine 0.57 - 1.00 mg/dL 1.10(H)  Sodium 134 - 144 mmol/L 145(H)  Potassium 3.5 - 5.2 mmol/L 4.7  Chloride 96 - 106 mmol/L 104  CO2 20 - 29 mmol/L 26  Calcium 8.7 - 10.3 mg/dL 9.6  Total Bilirubin 0.0 - 1.2 mg/dL -  Alkaline Phos 39 - 117 IU/L -  AST 0 - 40 IU/L -  ALT 0 - 32 IU/L -   CBC Latest Ref Rng & Units 09/10/2017  WBC 3.6 - 11.0 K/uL 4.4  Hemoglobin 12.0 - 16.0 g/dL 13.7  Hematocrit 35.0 - 47.0 % 40.1  Platelets 150 - 440 K/uL 169      Assessment and plan- Patient is a 77 y.o. 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  I discussed the results of  the pathology with the patient in detail.  Given that she has ER positive DCIS adjuvant hormone therapy  would be recommended for 5 years.  Patient was previously taking tamoxifen for her left breast DCIS between 2006 2011.  She did not have recurrent DCIS while she was on tamoxifen and hence she cannot be considered to be tamoxifen failure.  At this time I will wait for radiation treatment to be completed before we start hormone therapy.  I discussed risks and benefits of both tamoxifen and Arimidex.  Side effects of tamoxifen include all but not limited to fatigue, hot flashes, risk of DVT and uterine cancer as well as cardiovascular side effects.  Side effects of Arimidex include all but not limited to fatigue, hot flashes, cardiovascular side effects, arthralgias and worsening bone health.  Patient did have a baseline bone density scan which shows a T score of -2.1 at the right femur and a 10-year probability of a major osteoporotic fracture of 12.4%  and that of hip fracture to be 3.2%.  Arimidex could potentially make it worse but this can be monitored every other year.  Patient is concerned about using her Arimidex given her pre-existing arthralgias.  I explained to her that in terms of preventing another DCIS her Arimidex is slightly better than tamoxifen however there is been no improvement in overall survival with either of these 2 drugs.  It may be reasonable to attempt Arimidex at this time after she completes radiation treatment and if she has worsening arthralgia she can consider switching to alternative AI such as Aromasin or letrozole.  If she continues to have problems with aromatase inhibitors tamoxifen still remains an option.  We will go ahead and prescribe her Arimidex at this time which she will start taking next week.  I will see her back in 6 weeks time to see how she is tolerating her Arimidex.  She will call us in the interim if she is having any problems with Arimidex.  Patient will  also need to take calcium 1200 mg along with vitamin D 800 international units while she is on Arimidex for bone health.  Also discussed that her osteopenia is significant given that her 10-year probability of a major hip fracture is greater than 3% and she would be a good candidate for bisphosphonates.  Discussed risks of both oral and parenteral bisphosphonates especially osteonecrosis of the jaw which could be seen with parenteral bisphosphonates.  Patient would therefore like to try oral bisphosphonates at this time.  We will go ahead and prescribe Fosamax 70 mg weekly.  Also discussed other side effects of Fosamax including all but not limited to worsening heartburn.  Patient understands and agrees to proceed  Cancer Staging Ductal carcinoma in situ (DCIS) of left breast Staging form: Breast, AJCC 8th Edition - Clinical stage from 09/16/2017: Stage 0 (cTis ([place]), cN0, cM0, ER+, PR+) - Signed by Sindy Guadeloupe, MD on 09/18/2017     Total face to face encounter time for this patient visit was 30 min. >50% of the time was  spent in counseling and coordination of care.    Visit Diagnosis 1. Ductal carcinoma in situ (DCIS) of right breast   2. Goals of care, counseling/discussion      Dr. Randa Evens, MD, MPH [facility] [phone] 09/18/2017 9:55 AM

## 2017-09-19 ENCOUNTER — Ambulatory Visit: Payer: Medicare Other

## 2017-09-19 ENCOUNTER — Ambulatory Visit
Admission: RE | Admit: 2017-09-19 | Discharge: 2017-09-19 | Disposition: A | Discharge status: Home / Self Care | Payer: Medicare Other | Source: Ambulatory Visit | Attending: Radiation Oncology | Admitting: Radiation Oncology

## 2017-09-19 DIAGNOSIS — D0512 Intraductal carcinoma in situ of left breast: Secondary | ICD-10-CM | POA: Diagnosis not present

## 2017-09-19 DIAGNOSIS — Z510 Encounter for antineoplastic radiation therapy: Secondary | ICD-10-CM | POA: Diagnosis not present

## 2017-09-23 ENCOUNTER — Ambulatory Visit
Admission: RE | Admit: 2017-09-23 | Discharge: 2017-09-23 | Disposition: A | Discharge status: Home / Self Care | Payer: Medicare Other | Source: Ambulatory Visit | Attending: Radiation Oncology | Admitting: Radiation Oncology

## 2017-09-23 ENCOUNTER — Ambulatory Visit: Payer: Medicare Other

## 2017-09-23 DIAGNOSIS — D0512 Intraductal carcinoma in situ of left breast: Secondary | ICD-10-CM | POA: Insufficient documentation

## 2017-09-23 DIAGNOSIS — Z510 Encounter for antineoplastic radiation therapy: Secondary | ICD-10-CM | POA: Insufficient documentation

## 2017-09-24 ENCOUNTER — Ambulatory Visit: Payer: Medicare Other

## 2017-09-24 ENCOUNTER — Ambulatory Visit
Admission: RE | Admit: 2017-09-24 | Discharge: 2017-09-24 | Disposition: A | Discharge status: Home / Self Care | Payer: Medicare Other | Source: Ambulatory Visit | Attending: Radiation Oncology | Admitting: Radiation Oncology

## 2017-09-24 DIAGNOSIS — Z510 Encounter for antineoplastic radiation therapy: Secondary | ICD-10-CM | POA: Diagnosis not present

## 2017-09-24 DIAGNOSIS — D0511 Intraductal carcinoma in situ of right breast: Secondary | ICD-10-CM | POA: Diagnosis not present

## 2017-09-24 DIAGNOSIS — Z170 Estrogen receptor positive status [ER+]: Secondary | ICD-10-CM | POA: Diagnosis not present

## 2017-09-24 DIAGNOSIS — D0512 Intraductal carcinoma in situ of left breast: Secondary | ICD-10-CM | POA: Diagnosis not present

## 2017-09-25 ENCOUNTER — Ambulatory Visit: Payer: Medicare Other

## 2017-09-26 ENCOUNTER — Other Ambulatory Visit: Payer: Self-pay | Admitting: Family Medicine

## 2017-09-26 DIAGNOSIS — I10 Essential (primary) hypertension: Secondary | ICD-10-CM

## 2017-10-09 DIAGNOSIS — I480 Paroxysmal atrial fibrillation: Secondary | ICD-10-CM | POA: Diagnosis not present

## 2017-10-09 DIAGNOSIS — E782 Mixed hyperlipidemia: Secondary | ICD-10-CM | POA: Diagnosis not present

## 2017-10-09 DIAGNOSIS — I10 Essential (primary) hypertension: Secondary | ICD-10-CM | POA: Diagnosis not present

## 2017-10-09 DIAGNOSIS — I420 Dilated cardiomyopathy: Secondary | ICD-10-CM | POA: Diagnosis not present

## 2017-10-22 ENCOUNTER — Other Ambulatory Visit: Payer: Self-pay | Admitting: Family Medicine

## 2017-10-22 DIAGNOSIS — I10 Essential (primary) hypertension: Secondary | ICD-10-CM

## 2017-10-30 DIAGNOSIS — Z853 Personal history of malignant neoplasm of breast: Secondary | ICD-10-CM | POA: Diagnosis not present

## 2017-11-04 ENCOUNTER — Inpatient Hospital Stay: Payer: Medicare Other | Attending: Oncology | Admitting: Oncology

## 2017-11-04 ENCOUNTER — Inpatient Hospital Stay: Payer: Medicare Other

## 2017-11-04 ENCOUNTER — Encounter: Payer: Self-pay | Admitting: Radiation Oncology

## 2017-11-04 ENCOUNTER — Encounter: Payer: Self-pay | Admitting: Oncology

## 2017-11-04 ENCOUNTER — Ambulatory Visit
Admission: RE | Admit: 2017-11-04 | Discharge: 2017-11-04 | Disposition: A | Discharge status: Home / Self Care | Payer: Medicare Other | Source: Ambulatory Visit | Attending: Radiation Oncology | Admitting: Radiation Oncology

## 2017-11-04 ENCOUNTER — Other Ambulatory Visit: Payer: Self-pay

## 2017-11-04 VITALS — BP 130/74 | HR 57 | Temp 98.0°F | Resp 18 | Wt 241.4 lb

## 2017-11-04 VITALS — BP 120/86 | HR 54 | Temp 98.7°F | Resp 18 | Ht 65.5 in | Wt 241.0 lb

## 2017-11-04 DIAGNOSIS — Z170 Estrogen receptor positive status [ER+]: Secondary | ICD-10-CM | POA: Insufficient documentation

## 2017-11-04 DIAGNOSIS — I10 Essential (primary) hypertension: Secondary | ICD-10-CM | POA: Diagnosis not present

## 2017-11-04 DIAGNOSIS — Z79811 Long term (current) use of aromatase inhibitors: Secondary | ICD-10-CM | POA: Insufficient documentation

## 2017-11-04 DIAGNOSIS — Z923 Personal history of irradiation: Secondary | ICD-10-CM | POA: Diagnosis not present

## 2017-11-04 DIAGNOSIS — D0511 Intraductal carcinoma in situ of right breast: Secondary | ICD-10-CM | POA: Diagnosis not present

## 2017-11-04 DIAGNOSIS — M8580 Other specified disorders of bone density and structure, unspecified site: Secondary | ICD-10-CM | POA: Insufficient documentation

## 2017-11-04 DIAGNOSIS — Z5181 Encounter for therapeutic drug level monitoring: Secondary | ICD-10-CM

## 2017-11-04 DIAGNOSIS — C50911 Malignant neoplasm of unspecified site of right female breast: Secondary | ICD-10-CM

## 2017-11-04 DIAGNOSIS — F329 Major depressive disorder, single episode, unspecified: Secondary | ICD-10-CM | POA: Diagnosis not present

## 2017-11-04 DIAGNOSIS — H04123 Dry eye syndrome of bilateral lacrimal glands: Secondary | ICD-10-CM | POA: Diagnosis not present

## 2017-11-04 DIAGNOSIS — H524 Presbyopia: Secondary | ICD-10-CM | POA: Diagnosis not present

## 2017-11-04 LAB — COMPREHENSIVE METABOLIC PANEL
ALT: 14 U/L (ref 0–44)
AST: 25 U/L (ref 15–41)
Albumin: 3.9 g/dL (ref 3.5–5.0)
Alkaline Phosphatase: 40 U/L (ref 38–126)
Anion gap: 6 (ref 5–15)
BUN: 16 mg/dL (ref 8–23)
CO2: 29 mmol/L (ref 22–32)
Calcium: 9.2 mg/dL (ref 8.9–10.3)
Chloride: 105 mmol/L (ref 98–111)
Creatinine, Ser: 0.86 mg/dL (ref 0.44–1.00)
GFR calc Af Amer: >60 mL/min (ref >60)
GFR calc non Af Amer: >60 mL/min (ref >60)
Glucose, Bld: 100 mg/dL — ABNORMAL HIGH (ref 70–99)
Potassium: 4.3 mmol/L (ref 3.5–5.1)
Sodium: 140 mmol/L (ref 135–145)
Total Bilirubin: 0.8 mg/dL (ref 0.3–1.2)
Total Protein: 7 g/dL (ref 6.5–8.1)

## 2017-11-04 NOTE — Progress Notes (Signed)
Radiation Oncology Follow up Note  Name: Melissa Chase   Date:   11/04/2017 MRN:  [id number] DOB: 11/20/1940    This 77 y.o. female presents to the clinic today for one-month follow-up status post whole breast radiation to her right breast for ER/PR positive ductal carcinoma in situ.  REFERRING PROVIDER: Juline Patch, MD  HPI: patient is a 77 year old female now out 1 month having completed whole breast radiation to her right breast for ER/PR positive ductal carcinoma in situ. She seen today in routine follow up is doing well. She specifically denies breast tenderness cough or bone pain. She has started on.arimadex and is tolerating that well without side effect.  COMPLICATIONS OF TREATMENT: none  FOLLOW UP COMPLIANCE: keeps appointments   PHYSICAL EXAM:  BP 130/74 (BP Location: Right Arm, Patient Position: Sitting)   Pulse (!) 57   Temp 98 F (36.7 C) (Tympanic)   Resp 18   Wt 241 lb 6.5 oz (109.5 kg)   BMI 39.56 kg/m  Lungs are clear to A&P cardiac examination essentially unremarkable with regular rate and rhythm. No dominant mass or nodularity is noted in either breast in 2 positions examined. Incision is well-healed. No axillary or supraclavicular adenopathy is appreciated. Cosmetic result is excellent.still some slight hyperpigmentation around the nipple areolar complex which I've assured her will dissipate over time.Well-developed well-nourished patient in NAD. HEENT reveals PERLA, EOMI, discs not visualized.  Oral cavity is clear. No oral mucosal lesions are identified. Neck is clear without evidence of cervical or supraclavicular adenopathy. Lungs are clear to A&P. Cardiac examination is essentially unremarkable with regular rate and rhythm without murmur rub or thrill. Abdomen is benign with no organomegaly or masses noted. Motor sensory and DTR levels are equal and symmetric in the upper and lower extremities. Cranial nerves II through XII are grossly intact. Proprioception  is intact. No peripheral adenopathy or edema is identified. No motor or sensory levels are noted. Crude visual fields are within normal range.  RADIOLOGY RESULTS: no current films for review  PLAN: at the present time she is doing well with no evidence of disease. I am please were overall progress. I have asked to see her back in 4-5 months for follow-up. Patient is to call with any concerns. She continues on arimadex without side effect.  I would like to take this opportunity to thank you for allowing me to participate in the care of your patient.Noreene Filbert, MD

## 2017-11-08 NOTE — Progress Notes (Signed)
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  At that time she underwent a lumpectomy followed by radiation treatment and was on tamoxifen for 5 years.  Subsequently she has been undergoing regular screening mammograms.  She self palpated a mass in the right subareolar region.  This was followed by a mammogram and ultrasound in May 2019 which revealed a solid 1.1 x 0.9 x 0.5 cm right breast mass.  No evidence of right axillary adenopathy.  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  Consideration includes DCIS involving a papilloma and encapsulated papillary carcinoma.  Patient underwent lumpectomy on 07/09/2017 by Dr. Peyton Najjar which showed a 12 mm grade 1-2 DCIS with involvement of pre-existing papilloma and adjacent ducts.  Lateral excision revealed atypical ductal hyperplasia.  Margins were negative.  Tumor was greater than ER 90% positive and greater than 90% PR positive.  Pathologic stage Tis NX.  Patient completed adjuvant RT and was started on arimidex   Interval history- patient reports that since she started taking arimidex, she has been experiencing b/l kee pain. There are times when her legs give way and she is afraid of falling. She did not have these symptoms prior to taking arimidex.   ECOG PS- 1 Pain scale- 0   Review of systems- Review of Systems  Constitutional: Negative for chills, fever, malaise/fatigue and weight loss.  HENT: Negative for congestion, ear discharge and nosebleeds.   Eyes:  Negative for blurred vision.  Respiratory: Negative for cough, hemoptysis, sputum production, shortness of breath and wheezing.   Cardiovascular: Negative for chest pain, palpitations, orthopnea and claudication.  Gastrointestinal: Negative for abdominal pain, blood in stool, constipation, diarrhea, heartburn, melena, nausea and vomiting.  Genitourinary: Negative for dysuria, flank pain, frequency, hematuria and urgency.  Musculoskeletal: Positive for joint pain. Negative for back pain and myalgias.  Skin: Negative for rash.  Neurological: Negative for dizziness, tingling, focal weakness, seizures, weakness and headaches.  Endo/Heme/Allergies: Does not bruise/bleed easily.  Psychiatric/Behavioral: Negative for depression and suicidal ideas. The patient does not have insomnia.      No Known Allergies   Past Medical History:  Diagnosis Date  . Afib ([place])   . Arthritis   . Breast cancer ([place]) 2006   left breast  . Depression   . GERD (gastroesophageal reflux disease)   . Hypertension   . Insomnia   . Insomnia   . Personal history of radiation therapy 2006   37 tx.     Past Surgical History:  Procedure Laterality Date  . BREAST LUMPECTOMY WITH NEEDLE LOCALIZATION Right 07/09/2017   Procedure: BREAST LUMPECTOMY WITH NEEDLE LOCALIZATION;  Surgeon: Herbert Pun, MD;  Location: [facility] ORS;  Service: General;  Laterality: Right;  . BREAST SURGERY     left breast lumpectomy  . CARDIOVERSION N/A 03/21/2016   Procedure: Cardioversion;  Surgeon: Corey Skains, MD;  Location: [facility] ORS;  Service: Cardiovascular;  Laterality: N/A;  . CATARACT EXTRACTION, BILATERAL    . LAPAROSCOPIC CHOLECYSTECTOMY    . TONSILLECTOMY    . VAGINAL HYSTERECTOMY  Social History   Socioeconomic History  . Marital status: Widowed    Spouse name: Not on file  . Number of children: Not on file  . Years of education: Not on file  . Highest education level: Not on file  Occupational History  .  Not on file  Social Needs  . Financial resource strain: Not on file  . Food insecurity:    Worry: Not on file    Inability: Not on file  . Transportation needs:    Medical: Not on file    Non-medical: Not on file  Tobacco Use  . Smoking status: Never Smoker  . Smokeless tobacco: Never Used  Substance and Sexual Activity  . Alcohol use: Never    Frequency: Never  . Drug use: No  . Sexual activity: Not Currently  Lifestyle  . Physical activity:    Days per week: Not on file    Minutes per session: Not on file  . Stress: Not on file  Relationships  . Social connections:    Talks on phone: Not on file    Gets together: Not on file    Attends religious service: Not on file    Active member of club or organization: Not on file    Attends meetings of clubs or organizations: Not on file    Relationship status: Not on file  . Intimate partner violence:    Fear of current or ex partner: Not on file    Emotionally abused: Not on file    Physically abused: Not on file    Forced sexual activity: Not on file  Other Topics Concern  . Not on file  Social History Narrative  . Not on file    Family History  Problem Relation Age of Onset  . Diabetes Sister      Current Outpatient Medications:  .  alendronate (FOSAMAX) 70 MG tablet, Take 1 tablet (70 mg total) by mouth once a week. Take with a full glass of water on an empty stomach., Disp: 4 tablet, Rfl: 3 .  apixaban (ELIQUIS) 5 MG TABS tablet, Take 5 mg by mouth 2 (two) times daily., Disp: , Rfl:  .  calcium-vitamin D (OSCAL WITH D) 500-200 MG-UNIT TABS tablet, Take by mouth., Disp: , Rfl:  .  diphenhydramine-acetaminophen (TYLENOL PM) 25-500 MG TABS tablet, Take 1-2 tablets by mouth at bedtime as needed (FOR SLEEP.)., Disp: , Rfl:  .  estradiol (ESTRACE) 0.1 MG/GM vaginal cream, Place 1 Applicatorful vaginally at bedtime as needed (FOR VAGINAL DRYNESS.)., Disp: , Rfl:  .  hydrochlorothiazide (MICROZIDE) 12.5 MG capsule, TAKE 1  CAPSULE BY MOUTH EVERY DAY, Disp: 30 capsule, Rfl: 1 .  HYDROcodone-acetaminophen (NORCO/VICODIN) 5-325 MG tablet, Take 1 tablet by mouth every 4 (four) hours as needed. for pain, Disp: , Rfl: 0 .  lisinopril (PRINIVIL,ZESTRIL) 5 MG tablet, Take 5 mg by mouth daily., Disp: , Rfl:  .  Melatonin 10 MG TABS, Take 10 mg by mouth at bedtime., Disp: , Rfl:  .  metoprolol succinate (TOPROL-XL) 100 MG 24 hr tablet, TAKE 1 TABLET (100 MG TOTAL) BY MOUTH DAILY. TAKE WITH OR IMMEDIATELY FOLLOWING A MEAL., Disp: 30 tablet, Rfl: 4 .  Multiple Vitamins-Minerals (MULTIVITAMIN WITH MINERALS) tablet, Take 1 tablet by mouth daily., Disp: , Rfl:  .  NON FORMULARY, Take by mouth., Disp: , Rfl:  .  nystatin (MYCOSTATIN) 100000 UNIT/ML suspension, Take 5 mLs by mouth 4 (four) times daily as needed (for mouth sores)., Disp: , Rfl:  .  omeprazole (PRILOSEC) 40 MG capsule, TAKE 1 CAPSULE BY MOUTH EVERY DAY, Disp: 30 capsule, Rfl: 0 .  sertraline (ZOLOFT) 100 MG tablet, TAKE 1 TABLET BY MOUTH EVERY DAY, Disp: 90 tablet, Rfl: 0 .  Sulfacetamide Sodium (BLEPH-10 OP), Place 1 drop into both eyes every 3 (three) hours as needed (for dry eyes.)., Disp: , Rfl:  .  acetaminophen (TYLENOL) 500 MG tablet, Take 1,000 mg by mouth 2 (two) times daily as needed (for pain.)., Disp: , Rfl:  .  anastrozole (ARIMIDEX) 1 MG tablet, Take 1 tablet (1 mg total) by mouth daily. (Patient not taking: Reported on 11/04/2017), Disp: 30 tablet, Rfl: 3 .  Omega-3 1000 MG CAPS, Take 1 capsule (1,000 mg total) by mouth daily. (Patient not taking: Reported on 11/04/2017), Disp: 90 capsule, Rfl: 3 .  omeprazole (PRILOSEC) 40 MG capsule, Take 1 capsule (40 mg total) by mouth 2 (two) times daily. (Patient not taking: Reported on 11/04/2017), Disp: 60 capsule, Rfl: 11 .  omeprazole (PRILOSEC) 40 MG capsule, TAKE 1 CAPSULE BY MOUTH EVERY DAY (Patient not taking: Reported on 09/16/2017), Disp: 30 capsule, Rfl: 1 .  pravastatin (PRAVACHOL) 20 MG tablet, Take 1  tablet (20 mg total) by mouth daily. (Patient not taking: Reported on 11/04/2017), Disp: 30 tablet, Rfl: 6 .  silver sulfADIAZINE (SILVADENE) 1 % cream, Apply 1 application topically 2 (two) times daily. (Patient not taking: Reported on 09/16/2017), Disp: 50 g, Rfl: 2  Physical exam:  Vitals:   11/04/17 1433  BP: 120/86  Pulse: (!) 54  Resp: 18  Temp: 98.7 F (37.1 C)  TempSrc: Tympanic  SpO2: 97%  Weight: 241 lb (109.3 kg)  Height: 5' 5.5" (1.664 m)   Physical Exam  Constitutional: She is oriented to person, place, and time.  Patient is obese. Does not appear to be in any acute distress  HENT:  Head: Normocephalic and atraumatic.  Eyes: Pupils are equal, round, and reactive to light. EOM are normal.  Neck: Normal range of motion.  Cardiovascular: Normal rate, regular rhythm and normal heart sounds.  Pulmonary/Chest: Effort normal and breath sounds normal.  Abdominal: Soft. Bowel sounds are normal.  Neurological: She is alert and oriented to person, place, and time.  Skin: Skin is warm and dry.     CMP Latest Ref Rng & Units 11/04/2017  Glucose 70 - 99 mg/dL 100(H)  BUN 8 - 23 mg/dL 16  Creatinine 0.44 - 1.00 mg/dL 0.86  Sodium 135 - 145 mmol/L 140  Potassium 3.5 - 5.1 mmol/L 4.3  Chloride 98 - 111 mmol/L 105  CO2 22 - 32 mmol/L 29  Calcium 8.9 - 10.3 mg/dL 9.2  Total Protein 6.5 - 8.1 g/dL 7.0  Total Bilirubin 0.3 - 1.2 mg/dL 0.8  Alkaline Phos 38 - 126 U/L 40  AST 15 - 41 U/L 25  ALT 0 - 44 U/L 14   CBC Latest Ref Rng & Units 09/10/2017  WBC 3.6 - 11.0 K/uL 4.4  Hemoglobin 12.0 - 16.0 g/dL 13.7  Hematocrit 35.0 - 47.0 % 40.1  Platelets 150 - 440 K/uL 169     Assessment and plan- Patient is a 77 y.o. female with a history of right breast DCIS status post lumpectomy ER positive. She is s/p adjuvant RT and currently on arimidex   Patient reports worsening arthralgias since starting her arimidex which is affecting her QOL and she is concerned about fall risk.  Discussed trying alternative AI's although they do have similar side effects but some  women may tolerate better than the other. Other option would be to try tamoxifen which is has tolerated well in the past. She is s/p hysterectomy and already on anticoagulation for her A fib. Also when she was on tamoxifen in the past she did not have recurrence which was after she stopped taking it.  Patient and her daughter think tamoxifen will be a better option for her. We will therefore go ahead and prescribe that for her. Her basdeline bone density scan does show osteopenia but her fRAX score does not warrant the use of adjuvant bisphosphonates at this time   Total face to face encounter time for this patient visit was 30 min. >50% of the time was  spent in counseling and coordination of care.     Visit Diagnosis 1. Visit for monitoring Arimidex therapy   2. Osteopenia, unspecified location      Dr. Randa Evens, MD, MPH [facility] at [facility] [phone] 11/08/2017 6:47 AM

## 2017-11-17 ENCOUNTER — Telehealth: Payer: Self-pay | Admitting: Obstetrics and Gynecology

## 2017-11-17 ENCOUNTER — Other Ambulatory Visit: Payer: Self-pay | Admitting: Family Medicine

## 2017-11-17 DIAGNOSIS — I10 Essential (primary) hypertension: Secondary | ICD-10-CM

## 2017-11-19 ENCOUNTER — Other Ambulatory Visit: Payer: Self-pay | Admitting: Family Medicine

## 2017-11-19 DIAGNOSIS — I10 Essential (primary) hypertension: Secondary | ICD-10-CM

## 2017-11-26 NOTE — Telephone Encounter (Signed)
Error

## 2017-11-29 ENCOUNTER — Other Ambulatory Visit: Payer: Self-pay | Admitting: Family Medicine

## 2017-11-29 DIAGNOSIS — F329 Major depressive disorder, single episode, unspecified: Secondary | ICD-10-CM

## 2017-11-29 DIAGNOSIS — F339 Major depressive disorder, recurrent, unspecified: Secondary | ICD-10-CM

## 2017-11-29 DIAGNOSIS — F32A Depression, unspecified: Secondary | ICD-10-CM

## 2017-12-22 ENCOUNTER — Other Ambulatory Visit: Payer: Self-pay | Admitting: *Deleted

## 2017-12-22 MED ORDER — TAMOXIFEN CITRATE 20 MG PO TABS: 20.0000 mg | ORAL_TABLET | Freq: Every day | ORAL | 1 refills | Status: DC

## 2017-12-22 NOTE — Telephone Encounter (Signed)
Patient called to report that she has been off Arimidex for more than 3 weeks and it is time to order Tamoxifen as discussed

## 2017-12-27 ENCOUNTER — Other Ambulatory Visit: Payer: Self-pay | Admitting: Oncology

## 2017-12-29 ENCOUNTER — Other Ambulatory Visit: Payer: Self-pay | Admitting: Family Medicine

## 2017-12-29 DIAGNOSIS — F32A Depression, unspecified: Secondary | ICD-10-CM

## 2017-12-29 DIAGNOSIS — F329 Major depressive disorder, single episode, unspecified: Secondary | ICD-10-CM

## 2017-12-29 DIAGNOSIS — F339 Major depressive disorder, recurrent, unspecified: Secondary | ICD-10-CM

## 2017-12-29 NOTE — Telephone Encounter (Signed)
Her basdeline bone density scan does show osteopenia but her fRAX score does not warrant the use of adjuvant bisphosphonates at this time

## 2017-12-30 DIAGNOSIS — I420 Dilated cardiomyopathy: Secondary | ICD-10-CM | POA: Diagnosis not present

## 2017-12-30 DIAGNOSIS — I10 Essential (primary) hypertension: Secondary | ICD-10-CM | POA: Diagnosis not present

## 2017-12-30 DIAGNOSIS — Z6841 Body Mass Index (BMI) 40.0 and over, adult: Secondary | ICD-10-CM | POA: Diagnosis not present

## 2017-12-30 DIAGNOSIS — I480 Paroxysmal atrial fibrillation: Secondary | ICD-10-CM | POA: Diagnosis not present

## 2018-01-11 ENCOUNTER — Other Ambulatory Visit: Payer: Self-pay | Admitting: Family Medicine

## 2018-01-15 ENCOUNTER — Other Ambulatory Visit: Payer: Self-pay | Admitting: Oncology

## 2018-01-16 ENCOUNTER — Encounter: Payer: Self-pay | Admitting: Family Medicine

## 2018-01-16 ENCOUNTER — Ambulatory Visit (INDEPENDENT_AMBULATORY_CARE_PROVIDER_SITE_OTHER): Payer: Medicare Other | Admitting: Family Medicine

## 2018-01-16 VITALS — BP 120/81 | HR 60 | Resp 16 | Ht 65.5 in | Wt 239.0 lb

## 2018-01-16 DIAGNOSIS — F32A Depression, unspecified: Secondary | ICD-10-CM

## 2018-01-16 DIAGNOSIS — N644 Mastodynia: Secondary | ICD-10-CM

## 2018-01-16 DIAGNOSIS — K219 Gastro-esophageal reflux disease without esophagitis: Secondary | ICD-10-CM | POA: Diagnosis not present

## 2018-01-16 DIAGNOSIS — F329 Major depressive disorder, single episode, unspecified: Secondary | ICD-10-CM

## 2018-01-16 DIAGNOSIS — F339 Major depressive disorder, recurrent, unspecified: Secondary | ICD-10-CM | POA: Diagnosis not present

## 2018-01-16 DIAGNOSIS — E785 Hyperlipidemia, unspecified: Secondary | ICD-10-CM | POA: Diagnosis not present

## 2018-01-16 DIAGNOSIS — I10 Essential (primary) hypertension: Secondary | ICD-10-CM

## 2018-01-16 MED ORDER — AMOXICILLIN-POT CLAVULANATE 875-125 MG PO TABS: 1.0000 | ORAL_TABLET | Freq: Two times a day (BID) | ORAL | 0 refills | Status: DC

## 2018-01-16 MED ORDER — METOPROLOL SUCCINATE ER 100 MG PO TB24: 100.0000 mg | ORAL_TABLET | Freq: Every day | ORAL | 1 refills | Status: DC

## 2018-01-16 MED ORDER — HYDROCHLOROTHIAZIDE 12.5 MG PO CAPS: ORAL_CAPSULE | ORAL | 1 refills | Status: DC

## 2018-01-16 NOTE — Progress Notes (Signed)
Date:  01/16/2018   Name:  Melissa Chase   DOB:  1940/04/24   MRN:  [id number]   Chief Complaint: Hypertension (refill HCTZ and Metoprolol ) and Arthritis (severe pain in back )  Hypertension  This is a chronic problem. The current episode started more than 1 year ago. The problem is unchanged. The problem is controlled. Pertinent negatives include no anxiety, blurred vision, chest pain, headaches, malaise/fatigue, neck pain, orthopnea, palpitations, peripheral edema, PND, shortness of breath or sweats. There are no associated agents to hypertension. There are no known risk factors for coronary artery disease. Past treatments include beta blockers and diuretics. The current treatment provides moderate improvement. There are no compliance problems.  There is no history of angina, kidney disease, CAD/MI, CVA, heart failure, left ventricular hypertrophy, PVD or retinopathy. There is no history of chronic renal disease, a hypertension causing med or renovascular disease.  Arthritis  Presents for follow-up visit. She complains of pain. She reports no stiffness, joint swelling or joint warmth. The symptoms have been stable. Affected location: back. Pertinent negatives include no diarrhea, dry eyes, dry mouth, dysuria, fatigue, fever, pain at night, pain while resting, rash, Raynaud's syndrome, uveitis or weight loss.  Hyperlipidemia  This is a chronic problem. The current episode started more than 1 year ago. The problem is controlled. Recent lipid tests were reviewed and are normal. She has no history of chronic renal disease, diabetes, hypothyroidism, liver disease, obesity or nephrotic syndrome. There are no known factors aggravating her hyperlipidemia. Pertinent negatives include no chest pain, myalgias or shortness of breath. Current antihyperlipidemic treatment includes statins. The current treatment provides moderate improvement of lipids. There are no compliance problems.  Risk factors for  coronary artery disease include dyslipidemia, hypertension and post-menopausal.  Depression         This is a chronic problem.  The current episode started more than 1 year ago.   The problem occurs intermittently.  The problem has been waxing and waning since onset.  Associated symptoms include irritable, body aches and sad.  Associated symptoms include no decreased concentration, no fatigue, no helplessness, no hopelessness, does not have insomnia, no restlessness, no decreased interest, no appetite change, no myalgias, no headaches, no indigestion and no suicidal ideas.  Past treatments include SSRIs - Selective serotonin reuptake inhibitors.  Compliance with treatment is good.  Previous treatment provided mild relief.   Pertinent negatives include no hypothyroidism and no anxiety.   Review of Systems  Constitutional: Negative.  Negative for appetite change, chills, fatigue, fever, malaise/fatigue, unexpected weight change and weight loss.  HENT: Negative for congestion, ear discharge, ear pain, rhinorrhea, sinus pressure, sneezing and sore throat.   Eyes: Negative for blurred vision, photophobia, pain, discharge, redness and itching.  Respiratory: Negative for cough, shortness of breath, wheezing and stridor.   Cardiovascular: Negative for chest pain, palpitations, orthopnea and PND.  Gastrointestinal: Negative for abdominal pain, blood in stool, constipation, diarrhea, nausea and vomiting.  Endocrine: Negative for cold intolerance, heat intolerance, polydipsia, polyphagia and polyuria.  Genitourinary: Negative for dysuria, flank pain, frequency, hematuria, menstrual problem, pelvic pain, urgency, vaginal bleeding and vaginal discharge.  Musculoskeletal: Positive for arthritis. Negative for arthralgias, back pain, joint swelling, myalgias, neck pain and stiffness.  Skin: Negative for rash.  Allergic/Immunologic: Negative for environmental allergies and food allergies.  Neurological: Negative for  dizziness, weakness, light-headedness, numbness and headaches.  Hematological: Negative for adenopathy. Does not bruise/bleed easily.  Psychiatric/Behavioral: Positive for depression. Negative for decreased concentration,  dysphoric mood and suicidal ideas. The patient is not nervous/anxious and does not have insomnia.     Patient Active Problem List   Diagnosis Date Noted  . 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 Goals of care, counseling/discussion 09/18/2017  . Ductal carcinoma in situ (DCIS) of left breast 07/22/2017  . Sacroiliitis ([place]) 02/04/2017  . Dilated cardiomyopathy ([place]) 05/02/2016  . Bradycardia 04/04/2016  . A-fib ([place]) 03/20/2016  . Acute dyspnea 02/20/2016  . 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 Arthritis 06/06/2014  . Aggrieved 06/06/2014  . Dermatitis, eczematoid 06/06/2014  . Recurrent major depressive episodes ([place]) 06/06/2014  . Essential (primary) hypertension 06/06/2014  . Gastro-esophageal reflux disease without esophagitis 06/06/2014  . Cannot sleep 06/06/2014  . Menopause 06/06/2014    No Known Allergies  Past Surgical History:  Procedure Laterality Date  . BREAST LUMPECTOMY WITH NEEDLE LOCALIZATION Right 07/09/2017   Procedure: BREAST LUMPECTOMY WITH NEEDLE LOCALIZATION;  Surgeon: Herbert Pun, MD;  Location: [facility] ORS;  Service: General;  Laterality: Right;  . BREAST SURGERY     left breast lumpectomy  . CARDIOVERSION N/A 03/21/2016   Procedure: Cardioversion;  Surgeon: Corey Skains, MD;  Location: [facility] ORS;  Service: Cardiovascular;  Laterality: N/A;  . CATARACT EXTRACTION, BILATERAL    . LAPAROSCOPIC CHOLECYSTECTOMY    . TONSILLECTOMY    . VAGINAL HYSTERECTOMY      Social History   Tobacco Use  . Smoking status: Never Smoker  . Smokeless tobacco: Never Used  Substance Use Topics  . Alcohol use: Never    Frequency: Never  . Drug use: No      Medication list has been reviewed and updated.  Current Meds  Medication Sig  . alendronate (FOSAMAX) 70 MG tablet TAKE 1 TABLET BY MOUTH ONCE A WEEK. TAKE WITH A FULL GLASS OF WATER ON AN EMPTY STOMACH.  Marland Kitchen apixaban (ELIQUIS) 5 MG TABS tablet Take 5 mg by mouth 2 (two) times daily.  . calcium-vitamin D (OSCAL WITH D) 500-200 MG-UNIT TABS tablet Take by mouth.  . hydrochlorothiazide (MICROZIDE) 12.5 MG capsule TAKE 1 CAPSULE BY MOUTH EVERY DAY  . Melatonin 10 MG TABS Take 10 mg by mouth at bedtime.  . metoprolol succinate (TOPROL-XL) 100 MG 24 hr tablet TAKE 1 TABLET (100 MG TOTAL) BY MOUTH DAILY. TAKE WITH OR IMMEDIATELY FOLLOWING A MEAL.  Marland Kitchen nystatin (MYCOSTATIN) 100000 UNIT/ML suspension Take 5 mLs by mouth 4 (four) times daily as needed (for mouth sores).  Marland Kitchen omeprazole (PRILOSEC) 40 MG capsule Take 1 capsule (40 mg total) by mouth 2 (two) times daily.  . pravastatin (PRAVACHOL) 20 MG tablet Take 1 tablet (20 mg total) by mouth daily.  . sertraline (ZOLOFT) 100 MG tablet TAKE 1 TABLET BY MOUTH EVERY DAY  . Sulfacetamide Sodium (BLEPH-10 OP) Place 1 drop into both eyes every 3 (three) hours as needed (for dry eyes.).  Marland Kitchen tamoxifen (NOLVADEX) 20 MG tablet Take 1 tablet (20 mg total) by mouth daily.  . [DISCONTINUED] Multiple Vitamins-Minerals (MULTIVITAMIN WITH MINERALS) tablet Take 1 tablet by mouth daily.    PHQ 2/9 Scores 07/22/2017 02/04/2017 11/27/2015 10/10/2014  PHQ - 2 Score 0 6 0 4  PHQ- 9 Score - 16 - 9    Physical Exam Vitals signs and nursing note reviewed.  Constitutional:      General: She is irritable. She is not in acute distress.    Appearance: She is not diaphoretic.  HENT:  Head: Normocephalic and atraumatic.     Right Ear: Tympanic membrane, ear canal and external ear normal. There is no impacted cerumen.     Left Ear: Tympanic membrane, ear canal and external ear normal. There is no impacted cerumen.     Nose: Nose normal.  Eyes:     General:        Right  eye: No discharge.        Left eye: No discharge.     Conjunctiva/sclera: Conjunctivae normal.     Pupils: Pupils are equal, round, and reactive to light.  Neck:     Musculoskeletal: Normal range of motion and neck supple.     Thyroid: No thyromegaly.     Vascular: No JVD.  Cardiovascular:     Rate and Rhythm: Normal rate and regular rhythm.     Heart sounds: Normal heart sounds. No murmur. No friction rub. No gallop.   Pulmonary:     Effort: Pulmonary effort is normal.     Breath sounds: Normal breath sounds. No wheezing or rhonchi.  Abdominal:     General: Bowel sounds are normal.     Palpations: Abdomen is soft. There is no mass.     Tenderness: There is no abdominal tenderness. There is no guarding.  Musculoskeletal: Normal range of motion.  Lymphadenopathy:     Cervical: No cervical adenopathy.  Skin:    General: Skin is warm and dry.  Neurological:     Mental Status: She is alert.     Deep Tendon Reflexes: Reflexes are normal and symmetric.     BP 120/81   Pulse 60   Resp 16   Ht 5' 5.5" (1.664 m)   Wt 239 lb (108.4 kg)   SpO2 95%   BMI 39.17 kg/m   Assessment and Plan: 1. Depression, unspecified depression type Chronic stable currently on sertraline with no concerns.  2. Recurrent major depressive episodes (HCC) Stable continue Zoloft 100 mg daily.  Patient moving to [place] with new spouse and is in a stable position.  3. Dyslipidemia Chronic controlled continue pravastatin 20 mg daily.  4. Essential hypertension Chronic controlled continue metoprolol 100 mg daily hydrochlorothiazide 12.5 mg daily we will check renal function panel. - metoprolol succinate (TOPROL-XL) 100 MG 24 hr tablet; Take 1 tablet (100 mg total) by mouth daily. Take with or immediately following a meal.  Dispense: 90 tablet; Refill: 1 - hydrochlorothiazide (MICROZIDE) 12.5 MG capsule; TAKE 1 CAPSULE BY MOUTH EVERY DAY  Dispense: 90 capsule; Refill: 1 - Renal Function Panel  5.  Gastroesophageal reflux disease without esophagitis Chronic controlled continue Meprazole 40 mg daily.  6. Mastalgia Has an area of tenderness and swelling in the area of intraductal carcinoma surgery.  Initiate Augmentin 875 twice a day and a neurosurgical consult and to evaluate for any possibility of residual infection. - Ambulatory referral to General Surgery - amoxicillin-clavulanate (AUGMENTIN) 875-125 MG tablet; Take 1 tablet by mouth 2 (two) times daily.  Dispense: 20 tablet; Refill: 0

## 2018-01-17 LAB — RENAL FUNCTION PANEL
Albumin: 4.3 g/dL (ref 3.5–4.8)
BUN/Creatinine Ratio: 16 (ref 12–28)
BUN: 17 mg/dL (ref 8–27)
CO2: 26 mmol/L (ref 20–29)
Calcium: 9.9 mg/dL (ref 8.7–10.3)
Chloride: 105 mmol/L (ref 96–106)
Creatinine, Ser: 1.09 mg/dL — ABNORMAL HIGH (ref 0.57–1.00)
GFR calc Af Amer: 57 mL/min/{1.73_m2} — ABNORMAL LOW (ref >59)
GFR calc non Af Amer: 49 mL/min/{1.73_m2} — ABNORMAL LOW (ref >59)
Glucose: 90 mg/dL (ref 65–99)
Phosphorus: 3.5 mg/dL (ref 2.5–4.5)
Potassium: 4.7 mmol/L (ref 3.5–5.2)
Sodium: 144 mmol/L (ref 134–144)

## 2018-01-27 ENCOUNTER — Other Ambulatory Visit: Payer: Self-pay | Admitting: Family Medicine

## 2018-01-27 DIAGNOSIS — F329 Major depressive disorder, single episode, unspecified: Secondary | ICD-10-CM

## 2018-01-27 DIAGNOSIS — F339 Major depressive disorder, recurrent, unspecified: Secondary | ICD-10-CM

## 2018-01-27 DIAGNOSIS — F32A Depression, unspecified: Secondary | ICD-10-CM

## 2018-02-03 ENCOUNTER — Other Ambulatory Visit: Payer: Self-pay | Admitting: Family Medicine

## 2018-02-03 DIAGNOSIS — K219 Gastro-esophageal reflux disease without esophagitis: Secondary | ICD-10-CM

## 2018-02-05 ENCOUNTER — Encounter: Payer: Self-pay | Admitting: Oncology

## 2018-02-05 ENCOUNTER — Inpatient Hospital Stay: Payer: Medicare Other | Attending: Oncology | Admitting: Oncology

## 2018-02-05 VITALS — BP 114/75 | HR 53 | Temp 97.5°F | Resp 18 | Ht 65.5 in | Wt 239.0 lb

## 2018-02-05 DIAGNOSIS — Z79810 Long term (current) use of selective estrogen receptor modulators (SERMs): Secondary | ICD-10-CM

## 2018-02-05 DIAGNOSIS — I10 Essential (primary) hypertension: Secondary | ICD-10-CM

## 2018-02-05 DIAGNOSIS — Z7901 Long term (current) use of anticoagulants: Secondary | ICD-10-CM | POA: Insufficient documentation

## 2018-02-05 DIAGNOSIS — Z08 Encounter for follow-up examination after completed treatment for malignant neoplasm: Secondary | ICD-10-CM

## 2018-02-05 DIAGNOSIS — F329 Major depressive disorder, single episode, unspecified: Secondary | ICD-10-CM | POA: Insufficient documentation

## 2018-02-05 DIAGNOSIS — D0511 Intraductal carcinoma in situ of right breast: Secondary | ICD-10-CM | POA: Insufficient documentation

## 2018-02-05 DIAGNOSIS — Z853 Personal history of malignant neoplasm of breast: Secondary | ICD-10-CM

## 2018-02-05 NOTE — Progress Notes (Signed)
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 At that time she underwent a lumpectomy followed by radiation treatment and was on tamoxifen for 5 years. Subsequently she has been undergoing regular screening mammograms. She self palpated a mass in the right subareolar region. This was followed by a mammogram and ultrasound in May 2019 which revealed a solid 1.1 x 0.9 x 0.5 cm right breast mass. No evidence of right axillary adenopathy. 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 Consideration includes DCIS involving a papilloma and encapsulated papillary carcinoma. Patient underwent lumpectomy on 07/09/2017 by Dr. Peyton Najjar which showed a 12 mm grade 1-2 DCIS with involvement of pre-existing papilloma and adjacent ducts. Lateral excision revealed atypical ductal hyperplasia. Margins were negative. Tumor was greater than ER 90% positive and greater than 90% PR positive. Pathologic stage Tis NX. Patient completed adjuvant RT and was started on arimidex   Interval history-she is tolerating tamoxifen well and reports no significant side effects.  She does not have any significant joint pains or falls that she had with AI.  She is also taking her calcium and vitamin D.  She has some mild fatigue which is essentially stable  ECOG PS- 1 Pain scale- 0   Review of systems- Review of Systems  Constitutional: Positive for malaise/fatigue. Negative for chills, fever and weight loss.  HENT:  Negative for congestion, ear discharge and nosebleeds.   Eyes: Negative for blurred vision.  Respiratory: Negative for cough, hemoptysis, sputum production, shortness of breath and wheezing.   Cardiovascular: Negative for chest pain, palpitations, orthopnea and claudication.  Gastrointestinal: Negative for abdominal pain, blood in stool, constipation, diarrhea, heartburn, melena, nausea and vomiting.  Genitourinary: Negative for dysuria, flank pain, frequency, hematuria and urgency.  Musculoskeletal: Negative for back pain, joint pain and myalgias.  Skin: Negative for rash.  Neurological: Negative for dizziness, tingling, focal weakness, seizures, weakness and headaches.  Endo/Heme/Allergies: Does not bruise/bleed easily.  Psychiatric/Behavioral: Negative for depression and suicidal ideas. The patient does not have insomnia.       No Known Allergies   Past Medical History:  Diagnosis Date  . Afib ([place])   . Arthritis   . Breast cancer ([place]) 2006   left breast  . Depression   . GERD (gastroesophageal reflux disease)   . Hypertension   . Insomnia   . Insomnia   . Personal history of radiation therapy 2006   37 tx.     Past Surgical History:  Procedure Laterality Date  . BREAST LUMPECTOMY WITH NEEDLE LOCALIZATION Right 07/09/2017   Procedure: BREAST LUMPECTOMY WITH NEEDLE LOCALIZATION;  Surgeon: Herbert Pun, MD;  Location: [facility] ORS;  Service: General;  Laterality: Right;  . BREAST SURGERY     left breast lumpectomy  . CARDIOVERSION N/A 03/21/2016   Procedure: Cardioversion;  Surgeon: Corey Skains, MD;  Location: [facility] ORS;  Service: Cardiovascular;  Laterality: N/A;  . CATARACT EXTRACTION, BILATERAL    . LAPAROSCOPIC CHOLECYSTECTOMY    . TONSILLECTOMY    . VAGINAL HYSTERECTOMY  Social History   Socioeconomic History  . Marital status: Widowed    Spouse name: Not on file  . Number of children: Not on file  . Years of education: Not on file  . Highest  education level: Not on file  Occupational History  . Not on file  Social Needs  . Financial resource strain: Not on file  . Food insecurity:    Worry: Not on file    Inability: Not on file  . Transportation needs:    Medical: Not on file    Non-medical: Not on file  Tobacco Use  . Smoking status: Never Smoker  . Smokeless tobacco: Never Used  Substance and Sexual Activity  . Alcohol use: Never    Frequency: Never  . Drug use: No  . Sexual activity: Not Currently  Lifestyle  . Physical activity:    Days per week: Not on file    Minutes per session: Not on file  . Stress: Not on file  Relationships  . Social connections:    Talks on phone: Not on file    Gets together: Not on file    Attends religious service: Not on file    Active member of club or organization: Not on file    Attends meetings of clubs or organizations: Not on file    Relationship status: Not on file  . Intimate partner violence:    Fear of current or ex partner: Not on file    Emotionally abused: Not on file    Physically abused: Not on file    Forced sexual activity: Not on file  Other Topics Concern  . Not on file  Social History Narrative  . Not on file    Family History  Problem Relation Age of Onset  . Diabetes Sister   . Breast cancer Daughter      Current Outpatient Medications:  .  alendronate (FOSAMAX) 70 MG tablet, TAKE 1 TABLET BY MOUTH ONCE A WEEK. TAKE WITH A FULL GLASS OF WATER ON AN EMPTY STOMACH., Disp: 12 tablet, Rfl: 1 .  apixaban (ELIQUIS) 5 MG TABS tablet, Take 5 mg by mouth 2 (two) times daily., Disp: , Rfl:  .  calcium-vitamin D (OSCAL WITH D) 500-200 MG-UNIT TABS tablet, Take 1 tablet by mouth 2 (two) times daily. , Disp: , Rfl:  .  hydrochlorothiazide (MICROZIDE) 12.5 MG capsule, TAKE 1 CAPSULE BY MOUTH EVERY DAY, Disp: 90 capsule, Rfl: 1 .  Melatonin 10 MG TABS, Take 10 mg by mouth at bedtime., Disp: , Rfl:  .  metoprolol succinate (TOPROL-XL) 100 MG 24 hr tablet, Take  1 tablet (100 mg total) by mouth daily. Take with or immediately following a meal., Disp: 90 tablet, Rfl: 1 .  nystatin (MYCOSTATIN) 100000 UNIT/ML suspension, Take 5 mLs by mouth 4 (four) times daily as needed (for mouth sores)., Disp: , Rfl:  .  omeprazole (PRILOSEC) 40 MG capsule, TAKE 1 CAPSULE BY MOUTH EVERY DAY, Disp: 30 capsule, Rfl: 4 .  pravastatin (PRAVACHOL) 20 MG tablet, Take 1 tablet (20 mg total) by mouth daily., Disp: 30 tablet, Rfl: 6 .  sertraline (ZOLOFT) 100 MG tablet, TAKE 1 TABLET BY MOUTH EVERY DAY, Disp: 90 tablet, Rfl: 1 .  tamoxifen (NOLVADEX) 20 MG tablet, Take 1 tablet (20 mg total) by mouth daily., Disp: 30 tablet, Rfl: 1  Physical exam:  Vitals:   02/05/18 1447  BP: 114/75  Pulse: (!) 53  Resp: 18  Temp: (!) 97.5 F (36.4 C)  TempSrc: Tympanic  Weight: 239 lb (108.4 kg)  Height: 5' 5.5" (1.664 m)   Physical Exam Constitutional:      General: She is not in acute distress.    Appearance: She is obese.  HENT:     Head: Normocephalic and atraumatic.  Eyes:     Pupils: Pupils are equal, round, and reactive to light.  Neck:     Musculoskeletal: Normal range of motion.  Cardiovascular:     Rate and Rhythm: Normal rate and regular rhythm.     Heart sounds: Normal heart sounds.  Pulmonary:     Effort: Pulmonary effort is normal.     Breath sounds: Normal breath sounds.  Abdominal:     General: Bowel sounds are normal.     Palpations: Abdomen is soft.  Skin:    General: Skin is warm and dry.  Neurological:     Mental Status: She is alert and oriented to person, place, and time.    Breast exam was performed in seated and lying down position. Patient is status post left lumpectomy with a well-healed surgical scar. No evidence of any palpable masses. No evidence of axillary adenopathy. No evidence of any palpable masses or lumps in the right breast she is s/p right lumpectomy. No evidence of right axillary adenopathy   CMP Latest Ref Rng & Units  01/16/2018  Glucose 65 - 99 mg/dL 90  BUN 8 - 27 mg/dL 17  Creatinine 0.57 - 1.00 mg/dL 1.09(H)  Sodium 134 - 144 mmol/L 144  Potassium 3.5 - 5.2 mmol/L 4.7  Chloride 96 - 106 mmol/L 105  CO2 20 - 29 mmol/L 26  Calcium 8.7 - 10.3 mg/dL 9.9  Total Protein 6.5 - 8.1 g/dL -  Total Bilirubin 0.3 - 1.2 mg/dL -  Alkaline Phos 38 - 126 U/L -  AST 15 - 41 U/L -  ALT 0 - 44 U/L -   CBC Latest Ref Rng & Units 09/10/2017  WBC 3.6 - 11.0 K/uL 4.4  Hemoglobin 12.0 - 16.0 g/dL 13.7  Hematocrit 35.0 - 47.0 % 40.1  Platelets 150 - 440 K/uL 169     Assessment and plan- Patient is a 78 y.o. female with history of left breast DCIS in 2006 followed by right breast DCIS in 2019.  She is currently on tamoxifen as she could not tolerate AI and is here for routine follow-up of breast cancer  She is tolerating tamoxifen well without any significant side effects and she will continue to take that for 5 years along with calcium and vitamin D.  She will be due for repeat mammogram in June 2019 which will be coordinated by surgery.  I will see her back in 6 months with no labs   Visit Diagnosis 1. Encounter for follow-up surveillance of breast cancer   2. Long-term current use of tamoxifen      Dr. Randa Evens, MD, MPH [facility] at [facility] [phone] 02/09/2018 8:26 AM

## 2018-02-05 NOTE — Progress Notes (Signed)
Nipple area on right feels hard and she has lump under right arm

## 2018-02-11 ENCOUNTER — Other Ambulatory Visit: Payer: Self-pay | Admitting: Oncology

## 2018-02-24 ENCOUNTER — Other Ambulatory Visit: Payer: Self-pay | Admitting: Radiation Oncology

## 2018-04-14 ENCOUNTER — Telehealth: Payer: Self-pay

## 2018-04-14 ENCOUNTER — Other Ambulatory Visit: Payer: Self-pay

## 2018-04-14 NOTE — Telephone Encounter (Signed)
Patient would like to reschedule her appointment for tomorrow (04/14/2018) since she does not want to be around anybody and because she can not bring a visitor. Please give her a call at [phone]. Thank you.

## 2018-04-15 ENCOUNTER — Ambulatory Visit: Payer: Medicare Other | Admitting: Radiation Oncology

## 2018-04-30 ENCOUNTER — Other Ambulatory Visit: Payer: Self-pay | Admitting: Family Medicine

## 2018-04-30 DIAGNOSIS — K219 Gastro-esophageal reflux disease without esophagitis: Secondary | ICD-10-CM

## 2018-05-04 ENCOUNTER — Other Ambulatory Visit: Payer: Self-pay | Admitting: General Surgery

## 2018-05-04 DIAGNOSIS — Z853 Personal history of malignant neoplasm of breast: Secondary | ICD-10-CM

## 2018-05-18 ENCOUNTER — Encounter: Payer: Self-pay | Admitting: *Deleted

## 2018-05-27 ENCOUNTER — Ambulatory Visit: Payer: Medicare Other | Admitting: Radiation Oncology

## 2018-06-08 ENCOUNTER — Other Ambulatory Visit: Payer: Self-pay | Admitting: Oncology

## 2018-06-23 DIAGNOSIS — I480 Paroxysmal atrial fibrillation: Secondary | ICD-10-CM | POA: Diagnosis not present

## 2018-06-23 DIAGNOSIS — I10 Essential (primary) hypertension: Secondary | ICD-10-CM | POA: Diagnosis not present

## 2018-06-30 ENCOUNTER — Ambulatory Visit
Admission: RE | Admit: 2018-06-30 | Discharge: 2018-06-30 | Disposition: A | Discharge status: Home / Self Care | Payer: Medicare Other | Source: Ambulatory Visit | Attending: General Surgery | Admitting: General Surgery

## 2018-06-30 ENCOUNTER — Other Ambulatory Visit: Payer: Medicare Other

## 2018-06-30 ENCOUNTER — Other Ambulatory Visit: Payer: Self-pay

## 2018-06-30 DIAGNOSIS — Z853 Personal history of malignant neoplasm of breast: Secondary | ICD-10-CM | POA: Diagnosis not present

## 2018-06-30 DIAGNOSIS — R922 Inconclusive mammogram: Secondary | ICD-10-CM | POA: Diagnosis not present

## 2018-07-09 DIAGNOSIS — E782 Mixed hyperlipidemia: Secondary | ICD-10-CM | POA: Diagnosis not present

## 2018-07-13 ENCOUNTER — Other Ambulatory Visit: Payer: Self-pay | Admitting: Family Medicine

## 2018-07-13 DIAGNOSIS — I10 Essential (primary) hypertension: Secondary | ICD-10-CM

## 2018-07-16 ENCOUNTER — Encounter: Payer: Self-pay | Admitting: Family Medicine

## 2018-07-16 ENCOUNTER — Ambulatory Visit (INDEPENDENT_AMBULATORY_CARE_PROVIDER_SITE_OTHER): Payer: Medicare Other | Admitting: Family Medicine

## 2018-07-16 ENCOUNTER — Other Ambulatory Visit: Payer: Self-pay

## 2018-07-16 VITALS — BP 120/62 | HR 76 | Ht 65.5 in | Wt 230.0 lb

## 2018-07-16 DIAGNOSIS — F339 Major depressive disorder, recurrent, unspecified: Secondary | ICD-10-CM | POA: Diagnosis not present

## 2018-07-16 DIAGNOSIS — M810 Age-related osteoporosis without current pathological fracture: Secondary | ICD-10-CM | POA: Diagnosis not present

## 2018-07-16 DIAGNOSIS — E785 Hyperlipidemia, unspecified: Secondary | ICD-10-CM

## 2018-07-16 DIAGNOSIS — F32A Depression, unspecified: Secondary | ICD-10-CM

## 2018-07-16 DIAGNOSIS — R69 Illness, unspecified: Secondary | ICD-10-CM | POA: Diagnosis not present

## 2018-07-16 DIAGNOSIS — K219 Gastro-esophageal reflux disease without esophagitis: Secondary | ICD-10-CM | POA: Diagnosis not present

## 2018-07-16 DIAGNOSIS — F329 Major depressive disorder, single episode, unspecified: Secondary | ICD-10-CM | POA: Diagnosis not present

## 2018-07-16 DIAGNOSIS — I10 Essential (primary) hypertension: Secondary | ICD-10-CM | POA: Diagnosis not present

## 2018-07-16 MED ORDER — METOPROLOL SUCCINATE ER 100 MG PO TB24: 100.0000 mg | ORAL_TABLET | Freq: Every day | ORAL | 1 refills | Status: DC

## 2018-07-16 MED ORDER — ALENDRONATE SODIUM 70 MG PO TABS: ORAL_TABLET | ORAL | 1 refills | Status: DC

## 2018-07-16 MED ORDER — HYDROCHLOROTHIAZIDE 12.5 MG PO CAPS: ORAL_CAPSULE | ORAL | 1 refills | Status: DC

## 2018-07-16 MED ORDER — OMEPRAZOLE 40 MG PO CPDR: DELAYED_RELEASE_CAPSULE | ORAL | 1 refills | Status: DC

## 2018-07-16 MED ORDER — SERTRALINE HCL 100 MG PO TABS: 100.0000 mg | ORAL_TABLET | Freq: Every day | ORAL | 1 refills | Status: DC

## 2018-07-16 MED ORDER — PRAVASTATIN SODIUM 20 MG PO TABS: 20.0000 mg | ORAL_TABLET | Freq: Every day | ORAL | 1 refills | Status: DC

## 2018-07-16 NOTE — Progress Notes (Signed)
Date:  07/16/2018   Name:  Melissa Chase   DOB:  [birth year]   MRN:  [id number]   Chief Complaint: Osteoporosis, Gastroesophageal Reflux, Hypertension, Depression, and Hyperlipidemia  Gastroesophageal Reflux She reports no abdominal pain, no belching, no chest pain, no choking, no coughing, no dysphagia, no early satiety, no globus sensation, no heartburn, no hoarse voice, no nausea, no sore throat, no stridor, no tooth decay, no water brash or no wheezing. This is a chronic problem. The current episode started more than 1 year ago. The symptoms are aggravated by certain foods. Pertinent negatives include no fatigue. She has tried a PPI for the symptoms.  Hypertension This is a chronic problem. The current episode started more than 1 year ago. The problem is controlled. Pertinent negatives include no anxiety, blurred vision, chest pain, headaches, malaise/fatigue, neck pain, orthopnea, palpitations, peripheral edema, PND, shortness of breath or sweats. There are no associated agents to hypertension. There are no known risk factors for coronary artery disease. Past treatments include beta blockers and diuretics. The current treatment provides moderate improvement. There are no compliance problems.  There is no history of angina, kidney disease, CAD/MI, CVA, heart failure, left ventricular hypertrophy, PVD or retinopathy. There is no history of chronic renal disease, a hypertension causing med or renovascular disease.  Depression        This is a chronic problem.  The current episode started more than 1 year ago.   The problem occurs intermittently.  The problem has been waxing and waning since onset.  Associated symptoms include no fatigue, no myalgias and no headaches.     Exacerbated by: medical stress.  Past treatments include SSRIs - Selective serotonin reuptake inhibitors.  Compliance with treatment is good.  Previous treatment provided mild relief.   Pertinent negatives include no  hypothyroidism and no anxiety. Hyperlipidemia This is a chronic problem. The current episode started more than 1 year ago. The problem is controlled. Exacerbating diseases include obesity. She has no history of chronic renal disease, diabetes, hypothyroidism, liver disease or nephrotic syndrome. Pertinent negatives include no chest pain, myalgias or shortness of breath. The current treatment provides moderate improvement of lipids. There are no compliance problems.     Review of Systems  Constitutional: Negative.  Negative for chills, fatigue, fever, malaise/fatigue and unexpected weight change.  HENT: Negative for congestion, ear discharge, ear pain, hoarse voice, rhinorrhea, sinus pressure, sneezing and sore throat.   Eyes: Negative for blurred vision, photophobia, pain, discharge, redness and itching.  Respiratory: Negative for cough, choking, shortness of breath, wheezing and stridor.   Cardiovascular: Negative for chest pain, palpitations, orthopnea and PND.  Gastrointestinal: Negative for abdominal pain, blood in stool, constipation, diarrhea, dysphagia, heartburn, nausea and vomiting.  Endocrine: Negative for cold intolerance, heat intolerance, polydipsia, polyphagia and polyuria.  Genitourinary: Negative for dysuria, flank pain, frequency, hematuria, menstrual problem, pelvic pain, urgency, vaginal bleeding and vaginal discharge.  Musculoskeletal: Negative for arthralgias, back pain, myalgias and neck pain.  Skin: Negative for rash.  Allergic/Immunologic: Negative for environmental allergies and food allergies.  Neurological: Negative for dizziness, weakness, light-headedness, numbness and headaches.  Hematological: Negative for adenopathy. Does not bruise/bleed easily.  Psychiatric/Behavioral: Positive for depression. Negative for dysphoric mood. The patient is not nervous/anxious.     Patient Active Problem List   Diagnosis Date Noted  . Ductal carcinoma in situ (DCIS) of right  breast 09/18/2017  . Goals of care, counseling/discussion 09/18/2017  . Ductal carcinoma in situ (DCIS) of left  breast 07/22/2017  . Sacroiliitis ([place]) 02/04/2017  . Dilated cardiomyopathy ([place]) 05/02/2016  . Bradycardia 04/04/2016  . A-fib ([place]) 03/20/2016  . Acute dyspnea 02/20/2016  . 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 Arthritis 06/06/2014  . Aggrieved 06/06/2014  . Dermatitis, eczematoid 06/06/2014  . Recurrent major depressive episodes ([place]) 06/06/2014  . Essential (primary) hypertension 06/06/2014  . Gastro-esophageal reflux disease without esophagitis 06/06/2014  . Cannot sleep 06/06/2014  . Menopause 06/06/2014    No Known Allergies  Past Surgical History:  Procedure Laterality Date  . BREAST LUMPECTOMY Right 07/09/2017  . BREAST LUMPECTOMY Left 2006  . BREAST LUMPECTOMY WITH NEEDLE LOCALIZATION Right 07/09/2017   Procedure: BREAST LUMPECTOMY WITH NEEDLE LOCALIZATION;  Surgeon: Herbert Pun, MD;  Location: [facility] ORS;  Service: General;  Laterality: Right;  . BREAST SURGERY     left breast lumpectomy  . CARDIOVERSION N/A 03/21/2016   Procedure: Cardioversion;  Surgeon: Corey Skains, MD;  Location: [facility] ORS;  Service: Cardiovascular;  Laterality: N/A;  . CATARACT EXTRACTION, BILATERAL    . LAPAROSCOPIC CHOLECYSTECTOMY    . TONSILLECTOMY    . VAGINAL HYSTERECTOMY      Social History   Tobacco Use  . Smoking status: Never Smoker  . Smokeless tobacco: Never Used  Substance Use Topics  . Alcohol use: Never    Frequency: Never  . Drug use: No     Medication list has been reviewed and updated.  Current Meds  Medication Sig  . alendronate (FOSAMAX) 70 MG tablet TAKE 1 TABLET BY MOUTH ONCE A WEEK. TAKE WITH A FULL GLASS OF WATER ON AN EMPTY STOMACH.  Marland Kitchen apixaban (ELIQUIS) 5 MG TABS tablet Take 5 mg by mouth 2 (two) times daily.  . calcium-vitamin D (OSCAL WITH D) 500-200 MG-UNIT TABS  tablet Take 1 tablet by mouth 2 (two) times daily.   . hydrochlorothiazide (MICROZIDE) 12.5 MG capsule TAKE 1 CAPSULE BY MOUTH EVERY DAY  . Melatonin 10 MG TABS Take 20 mg by mouth at bedtime.   . metoprolol succinate (TOPROL-XL) 100 MG 24 hr tablet Take 1 tablet (100 mg total) by mouth daily. Take with or immediately following a meal.  . omeprazole (PRILOSEC) 40 MG capsule TAKE 1 CAPSULE BY MOUTH EVERY DAY  . pravastatin (PRAVACHOL) 20 MG tablet Take 1 tablet (20 mg total) by mouth daily.  . sertraline (ZOLOFT) 100 MG tablet TAKE 1 TABLET BY MOUTH EVERY DAY  . tamoxifen (NOLVADEX) 20 MG tablet TAKE 1 TABLET BY MOUTH EVERY DAY    PHQ 2/9 Scores 07/16/2018 07/22/2017 02/04/2017 11/27/2015  PHQ - 2 Score 0 0 6 0  PHQ- 9 Score 0 - 16 -    BP Readings from Last 3 Encounters:  07/16/18 120/62  02/05/18 114/75  01/16/18 120/81    Physical Exam Vitals signs and nursing note reviewed.  Constitutional:      Appearance: She is well-developed.  HENT:     Head: Normocephalic.     Right Ear: Tympanic membrane, ear canal and external ear normal.     Left Ear: Tympanic membrane, ear canal and external ear normal.     Nose: Nose normal.  Eyes:     General: Lids are everted, no foreign bodies appreciated. No scleral icterus.       Left eye: No foreign body or hordeolum.     Conjunctiva/sclera: Conjunctivae normal.     Right eye: Right conjunctiva is not injected.     Left eye: Left  conjunctiva is not injected.     Pupils: Pupils are equal, round, and reactive to light.  Neck:     Musculoskeletal: Normal range of motion and neck supple.     Thyroid: No thyromegaly.     Vascular: No JVD.     Trachea: No tracheal deviation.  Cardiovascular:     Rate and Rhythm: Normal rate and regular rhythm.     Heart sounds: Normal heart sounds. No murmur. No friction rub. No gallop.   Pulmonary:     Effort: Pulmonary effort is normal. No respiratory distress.     Breath sounds: Normal breath sounds. No  wheezing or rales.  Abdominal:     General: Bowel sounds are normal.     Palpations: Abdomen is soft. There is no mass.     Tenderness: There is no abdominal tenderness. There is no guarding or rebound.  Musculoskeletal: Normal range of motion.        General: No tenderness.  Lymphadenopathy:     Cervical: No cervical adenopathy.  Skin:    General: Skin is warm.     Findings: No rash.  Neurological:     Mental Status: She is alert and oriented to person, place, and time.     Cranial Nerves: No cranial nerve deficit.     Deep Tendon Reflexes: Reflexes normal.  Psychiatric:        Mood and Affect: Mood is not anxious or depressed.     Wt Readings from Last 3 Encounters:  07/16/18 230 lb (104.3 kg)  02/05/18 239 lb (108.4 kg)  01/16/18 239 lb (108.4 kg)    BP 120/62   Pulse 76   Ht 5' 5.5" (1.664 m)   Wt 230 lb (104.3 kg)   BMI 37.69 kg/m   Assessment and Plan:  1. Essential hypertension - hydrochlorothiazide (MICROZIDE) 12.5 MG capsule; TAKE 1 CAPSULE BY MOUTH EVERY DAY  Dispense: 90 capsule; Refill: 1 - metoprolol succinate (TOPROL-XL) 100 MG 24 hr tablet; Take 1 tablet (100 mg total) by mouth daily. Take with or immediately following a meal.  Dispense: 90 tablet; Refill: 1 - Renal Function Panel  2. Gastroesophageal reflux disease without esophagitis  - omeprazole (PRILOSEC) 40 MG capsule; TAKE 1 CAPSULE BY MOUTH EVERY DAY  Dispense: 90 capsule; Refill: 1  3. Dyslipidemia  - pravastatin (PRAVACHOL) 20 MG tablet; Take 1 tablet (20 mg total) by mouth daily.  Dispense: 90 tablet; Refill: 1 - Lipid Panel With LDL/HDL Ratio  4. Depression, unspecified depression type  - sertraline (ZOLOFT) 100 MG tablet; Take 1 tablet (100 mg total) by mouth daily.  Dispense: 90 tablet; Refill: 1  5. Recurrent major depressive episodes (HCC)  - sertraline (ZOLOFT) 100 MG tablet; Take 1 tablet (100 mg total) by mouth daily.  Dispense: 90 tablet; Refill: 1  6. Age related  osteoporosis, unspecified pathological fracture presence  - alendronate (FOSAMAX) 70 MG tablet; TAKE 1 TABLET BY MOUTH ONCE A WEEK. TAKE WITH A FULL GLASS OF WATER ON AN EMPTY STOMACH.  Dispense: 12 tablet; Refill: 1  7. Taking medication for chronic disease  - Hepatic function panel

## 2018-07-17 LAB — HEPATIC FUNCTION PANEL
ALT: 15 IU/L (ref 0–32)
AST: 22 IU/L (ref 0–40)
Alkaline Phosphatase: 35 IU/L — ABNORMAL LOW (ref 39–117)
Bilirubin Total: 0.7 mg/dL (ref 0.0–1.2)
Bilirubin, Direct: 0.22 mg/dL (ref 0.00–0.40)
Total Protein: 6.2 g/dL (ref 6.0–8.5)

## 2018-07-17 LAB — LIPID PANEL WITH LDL/HDL RATIO
Cholesterol, Total: 180 mg/dL (ref 100–199)
HDL: 75 mg/dL (ref >39)
LDL Calculated: 87 mg/dL (ref 0–99)
LDl/HDL Ratio: 1.2 ratio (ref 0.0–3.2)
Triglycerides: 92 mg/dL (ref 0–149)
VLDL Cholesterol Cal: 18 mg/dL (ref 5–40)

## 2018-07-17 LAB — RENAL FUNCTION PANEL
Albumin: 4 g/dL (ref 3.7–4.7)
BUN/Creatinine Ratio: 10 — ABNORMAL LOW (ref 12–28)
BUN: 10 mg/dL (ref 8–27)
CO2: 25 mmol/L (ref 20–29)
Calcium: 9.3 mg/dL (ref 8.7–10.3)
Chloride: 104 mmol/L (ref 96–106)
Creatinine, Ser: 1 mg/dL (ref 0.57–1.00)
GFR calc Af Amer: 63 mL/min/{1.73_m2} (ref >59)
GFR calc non Af Amer: 55 mL/min/{1.73_m2} — ABNORMAL LOW (ref >59)
Glucose: 89 mg/dL (ref 65–99)
Phosphorus: 3.4 mg/dL (ref 3.0–4.3)
Potassium: 4 mmol/L (ref 3.5–5.2)
Sodium: 147 mmol/L — ABNORMAL HIGH (ref 134–144)

## 2018-08-05 ENCOUNTER — Other Ambulatory Visit: Payer: Self-pay

## 2018-08-05 ENCOUNTER — Other Ambulatory Visit: Payer: Self-pay | Admitting: Oncology

## 2018-08-06 ENCOUNTER — Encounter: Payer: Self-pay | Admitting: Oncology

## 2018-08-06 ENCOUNTER — Inpatient Hospital Stay: Payer: Medicare Other | Attending: Oncology | Admitting: Oncology

## 2018-08-06 ENCOUNTER — Encounter: Payer: Self-pay | Admitting: Radiation Oncology

## 2018-08-06 ENCOUNTER — Ambulatory Visit
Admission: RE | Admit: 2018-08-06 | Discharge: 2018-08-06 | Disposition: A | Discharge status: Home / Self Care | Payer: Medicare Other | Source: Ambulatory Visit | Attending: Radiation Oncology | Admitting: Radiation Oncology

## 2018-08-06 ENCOUNTER — Other Ambulatory Visit: Payer: Self-pay

## 2018-08-06 VITALS — BP 132/64 | HR 59 | Temp 98.1°F | Resp 18 | Wt 231.0 lb

## 2018-08-06 DIAGNOSIS — Z5181 Encounter for therapeutic drug level monitoring: Secondary | ICD-10-CM

## 2018-08-06 DIAGNOSIS — Z923 Personal history of irradiation: Secondary | ICD-10-CM | POA: Diagnosis not present

## 2018-08-06 DIAGNOSIS — Z170 Estrogen receptor positive status [ER+]: Secondary | ICD-10-CM | POA: Diagnosis not present

## 2018-08-06 DIAGNOSIS — D0511 Intraductal carcinoma in situ of right breast: Secondary | ICD-10-CM | POA: Insufficient documentation

## 2018-08-06 DIAGNOSIS — Z08 Encounter for follow-up examination after completed treatment for malignant neoplasm: Secondary | ICD-10-CM

## 2018-08-06 DIAGNOSIS — Z79810 Long term (current) use of selective estrogen receptor modulators (SERMs): Secondary | ICD-10-CM | POA: Insufficient documentation

## 2018-08-06 DIAGNOSIS — C50911 Malignant neoplasm of unspecified site of right female breast: Secondary | ICD-10-CM

## 2018-08-06 NOTE — Progress Notes (Signed)
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 At that time she underwent a lumpectomy followed by radiation treatment and was on tamoxifen for 5 years. Subsequently she has been undergoing regular screening mammograms. She self palpated a mass in the right subareolar region. This was followed by a mammogram and ultrasound in May 2019 which revealed a solid 1.1 x 0.9 x 0.5 cm right breast mass. No evidence of right axillary adenopathy. 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 Consideration includes DCIS involving a papilloma and encapsulated papillary carcinoma. Patient underwent lumpectomy on 07/09/2017 by Dr. Peyton Najjar which showed a 12 mm grade 1-2 DCIS with involvement of pre-existing papilloma and adjacent ducts. Lateral excision revealed atypical ductal hyperplasia. Margins were negative. Tumor was greater than ER 90% positive and greater than 90% PR positive. Pathologic stage Tis NX. Patientcompleted adjuvant RT and was started on arimidex   Interval history-she reports tolerating tamoxifen well and has no significant side effects.  She is also taking her calcium and vitamin D.  Appetite and weight have remained stable.  ECOG PS- 1 Pain scale- 0   Review of systems- Review of Systems  Constitutional: Negative for chills, fever, malaise/fatigue and weight loss.  HENT: Negative for congestion, ear discharge and nosebleeds.   Eyes: Negative for blurred vision.  Respiratory:  Negative for cough, hemoptysis, sputum production, shortness of breath and wheezing.   Cardiovascular: Negative for chest pain, palpitations, orthopnea and claudication.  Gastrointestinal: Negative for abdominal pain, blood in stool, constipation, diarrhea, heartburn, melena, nausea and vomiting.  Genitourinary: Negative for dysuria, flank pain, frequency, hematuria and urgency.  Musculoskeletal: Negative for back pain, joint pain and myalgias.  Skin: Negative for rash.  Neurological: Negative for dizziness, tingling, focal weakness, seizures, weakness and headaches.  Endo/Heme/Allergies: Does not bruise/bleed easily.  Psychiatric/Behavioral: Negative for depression and suicidal ideas. The patient does not have insomnia.       No Known Allergies   Past Medical History:  Diagnosis Date  . Afib ([place])   . Arthritis   . Breast cancer ([place]) 2006   left breast  . Breast cancer ([place]) 2019   right breast  . Depression   . GERD (gastroesophageal reflux disease)   . Hypertension   . Insomnia   . Insomnia   . Personal history of radiation therapy 2006   36 tx.  . Personal history of radiation therapy 2019     Past Surgical History:  Procedure Laterality Date  . BREAST LUMPECTOMY Right 07/09/2017  . BREAST LUMPECTOMY Left 2006  . BREAST LUMPECTOMY WITH NEEDLE LOCALIZATION Right 07/09/2017   Procedure: BREAST LUMPECTOMY WITH NEEDLE LOCALIZATION;  Surgeon: Herbert Pun, MD;  Location: [facility] ORS;  Service: General;  Laterality: Right;  . BREAST SURGERY     left breast lumpectomy  . CARDIOVERSION N/A 03/21/2016   Procedure: Cardioversion;  Surgeon: Corey Skains, MD;  Location: [facility] ORS;  Service: Cardiovascular;  Laterality: N/A;  . CATARACT EXTRACTION, BILATERAL    . LAPAROSCOPIC CHOLECYSTECTOMY    . TONSILLECTOMY    . VAGINAL HYSTERECTOMY  Social History   Socioeconomic History  . Marital status: Widowed    Spouse name: Not on file  . Number of children: Not on  file  . Years of education: Not on file  . Highest education level: Not on file  Occupational History  . Not on file  Social Needs  . Financial resource strain: Not on file  . Food insecurity    Worry: Not on file    Inability: Not on file  . Transportation needs    Medical: Not on file    Non-medical: Not on file  Tobacco Use  . Smoking status: Never Smoker  . Smokeless tobacco: Never Used  Substance and Sexual Activity  . Alcohol use: Never    Frequency: Never  . Drug use: No  . Sexual activity: Not Currently  Lifestyle  . Physical activity    Days per week: Not on file    Minutes per session: Not on file  . Stress: Not on file  Relationships  . Social Herbalist on phone: Not on file    Gets together: Not on file    Attends religious service: Not on file    Active member of club or organization: Not on file    Attends meetings of clubs or organizations: Not on file    Relationship status: Not on file  . Intimate partner violence    Fear of current or ex partner: Not on file    Emotionally abused: Not on file    Physically abused: Not on file    Forced sexual activity: Not on file  Other Topics Concern  . Not on file  Social History Narrative  . Not on file    Family History  Problem Relation Age of Onset  . Diabetes Sister   . Breast cancer Daughter      Current Outpatient Medications:  .  alendronate (FOSAMAX) 70 MG tablet, TAKE 1 TABLET BY MOUTH ONCE A WEEK. TAKE WITH A FULL GLASS OF WATER ON AN EMPTY STOMACH., Disp: 12 tablet, Rfl: 1 .  hydrochlorothiazide (MICROZIDE) 12.5 MG capsule, TAKE 1 CAPSULE BY MOUTH EVERY DAY, Disp: 90 capsule, Rfl: 1 .  apixaban (ELIQUIS) 5 MG TABS tablet, Take 5 mg by mouth 2 (two) times daily., Disp: , Rfl:  .  calcium-vitamin D (OSCAL WITH D) 500-200 MG-UNIT TABS tablet, Take 1 tablet by mouth 2 (two) times daily. , Disp: , Rfl:  .  Melatonin 10 MG TABS, Take 20 mg by mouth at bedtime. , Disp: , Rfl:  .   metoprolol succinate (TOPROL-XL) 100 MG 24 hr tablet, Take 1 tablet (100 mg total) by mouth daily. Take with or immediately following a meal., Disp: 90 tablet, Rfl: 1 .  nystatin (MYCOSTATIN) 100000 UNIT/ML suspension, Take 5 mLs by mouth 4 (four) times daily as needed (for mouth sores)., Disp: , Rfl:  .  omeprazole (PRILOSEC) 40 MG capsule, TAKE 1 CAPSULE BY MOUTH EVERY DAY, Disp: 90 capsule, Rfl: 1 .  pravastatin (PRAVACHOL) 20 MG tablet, Take 1 tablet (20 mg total) by mouth daily., Disp: 90 tablet, Rfl: 1 .  sertraline (ZOLOFT) 100 MG tablet, Take 1 tablet (100 mg total) by mouth daily., Disp: 90 tablet, Rfl: 1 .  tamoxifen (NOLVADEX) 20 MG tablet, TAKE 1 TABLET BY MOUTH EVERY DAY, Disp: 90 tablet, Rfl: 1  Physical exam:  Vitals:   08/06/18 1410  BP: 132/64  Pulse: (!) 59  Resp: 18  Temp: 98.1 F (36.7 C)  Weight: 231 lb (104.8 kg)   Physical Exam Constitutional:      General: She is not in acute distress.    Appearance: She is obese.  HENT:     Head: Normocephalic and atraumatic.  Eyes:     Pupils: Pupils are equal, round, and reactive to light.  Neck:     Musculoskeletal: Normal range of motion.  Cardiovascular:     Rate and Rhythm: Normal rate and regular rhythm.     Heart sounds: Normal heart sounds.  Pulmonary:     Effort: Pulmonary effort is normal.     Breath sounds: Normal breath sounds.  Abdominal:     General: Bowel sounds are normal.     Palpations: Abdomen is soft.  Skin:    General: Skin is warm and dry.  Neurological:     Mental Status: She is alert and oriented to person, place, and time.      CMP Latest Ref Rng & Units 07/16/2018  Glucose 65 - 99 mg/dL 89  BUN 8 - 27 mg/dL 10  Creatinine 0.57 - 1.00 mg/dL 1.00  Sodium 134 - 144 mmol/L 147(H)  Potassium 3.5 - 5.2 mmol/L 4.0  Chloride 96 - 106 mmol/L 104  CO2 20 - 29 mmol/L 25  Calcium 8.7 - 10.3 mg/dL 9.3  Total Protein 6.0 - 8.5 g/dL 6.2  Total Bilirubin 0.0 - 1.2 mg/dL 0.7  Alkaline Phos 39 -  117 IU/L 35(L)  AST 0 - 40 IU/L 22  ALT 0 - 32 IU/L 15   CBC Latest Ref Rng & Units 09/10/2017  WBC 3.6 - 11.0 K/uL 4.4  Hemoglobin 12.0 - 16.0 g/dL 13.7  Hematocrit 35.0 - 47.0 % 40.1  Platelets 150 - 440 K/uL 169      Assessment and plan- Patient is a 78 y.o. female with history of left breast DCIS in 2006 followed by right breast DCIS in 2019.    She is currently on tamoxifen and go for a routine follow-up  Clinically patient is doing well and no concerning signs and symptoms for recurrence.  Patient will continue tamoxifen for completed 5 years along with calcium and vitamin D.  She did have a breast exam by Dr. Donella Stade today and also had a mammogram in June 2020 which did not show any evidence of malignancy.  I will see her back in 6 months.  No labs   Visit Diagnosis 1. Encounter for follow-up surveillance of breast cancer   2. Encounter for monitoring tamoxifen therapy      Dr. Randa Evens, MD, MPH [facility] at [facility] [place] [phone] 08/06/2018 2:54 PM

## 2018-08-06 NOTE — Progress Notes (Signed)
Radiation Oncology Follow up Note  Name: Melissa Chase   Date:   08/06/2018 MRN:  [id number] DOB: 03-01-1940    This 78 y.o. 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  She otherwise specifically denies cough or bone pain.  She is currently on.  Tamoxifen tolerating that well without side effect.  She had mammograms in June which I have reviewed were BI-RADS 2 benign.  COMPLICATIONS OF TREATMENT: none  FOLLOW UP COMPLIANCE: keeps appointments   PHYSICAL EXAM:  BP 132/64 (BP Location: Left Arm, Patient Position: Sitting)   Pulse (!) 59   Temp 98.1 F (36.7 C) (Tympanic)   Resp 18   Wt 231 lb 0.7 oz (104.8 kg)   BMI 37.86 kg/m  Patient has some retraction of the nipple towards her incision which is the nipple areole complex no dominant mass or nodularity is noted in either breast in 2 positions examined.  No axillary or supraclavicular adenopathy is noted.  Well-developed well-nourished patient in NAD. HEENT reveals PERLA, EOMI, discs not visualized.  Oral cavity is clear. No oral mucosal lesions are identified. Neck is clear without evidence of cervical or supraclavicular adenopathy. Lungs are clear to A&P. Cardiac examination is essentially unremarkable with regular rate and rhythm without murmur rub or thrill. Abdomen is benign with no organomegaly or masses noted. Motor sensory and DTR levels are equal and symmetric in the upper and lower extremities. Cranial nerves II through XII are grossly intact.  Proprioception is intact. No peripheral adenopathy or edema is identified. No motor or sensory levels are noted. Crude visual fields are within normal range.  RADIOLOGY RESULTS: Mammograms are reviewed and compatible with above-stated findings  PLAN: Present time patient is doing well with no evidence of disease.  She continues on tamoxifen without side effect.  I have not told her there is really not much to do besides plastic surgery to correct the defect of her nipple.  I have asked to see her back in 6 months for follow-up and then will start once a year follow-up visits.  Patient knows to call with any concerns.  I would like to take this opportunity to thank you for allowing me to participate in the care of your patient.Noreene Filbert, MD

## 2018-08-06 NOTE — Progress Notes (Signed)
Patient had mammogram on 06/30/18 and does not offer any problems today.

## 2018-08-21 ENCOUNTER — Encounter: Payer: Self-pay | Admitting: Family Medicine

## 2018-08-21 ENCOUNTER — Other Ambulatory Visit: Payer: Self-pay

## 2018-08-21 ENCOUNTER — Ambulatory Visit (INDEPENDENT_AMBULATORY_CARE_PROVIDER_SITE_OTHER): Payer: Medicare Other | Admitting: Family Medicine

## 2018-08-21 VITALS — BP 90/62 | HR 88 | Ht 65.5 in | Wt 225.0 lb

## 2018-08-21 DIAGNOSIS — R6881 Early satiety: Secondary | ICD-10-CM

## 2018-08-21 DIAGNOSIS — R634 Abnormal weight loss: Secondary | ICD-10-CM

## 2018-08-21 DIAGNOSIS — F32A Depression, unspecified: Secondary | ICD-10-CM

## 2018-08-21 DIAGNOSIS — R630 Anorexia: Secondary | ICD-10-CM | POA: Diagnosis not present

## 2018-08-21 DIAGNOSIS — D0512 Intraductal carcinoma in situ of left breast: Secondary | ICD-10-CM

## 2018-08-21 DIAGNOSIS — F329 Major depressive disorder, single episode, unspecified: Secondary | ICD-10-CM | POA: Diagnosis not present

## 2018-08-21 MED ORDER — SUCRALFATE 1 G PO TABS: 1.0000 g | ORAL_TABLET | Freq: Two times a day (BID) | ORAL | 1 refills | Status: DC

## 2018-08-21 NOTE — Progress Notes (Addendum)
Date:  08/21/2018   Name:  Melissa Chase   DOB:  1940-02-02   MRN:  [id number]   Chief Complaint: Fatigue (loss of appetite, nauseated, "feels awful") and Depression (PHQ9=8  GAD7=3)  Depression        This is a chronic problem.  The current episode started more than 1 month ago.   The onset quality is gradual.   The problem occurs intermittently.  The problem has been gradually improving since onset.  Associated symptoms include fatigue, helplessness, hopelessness, insomnia, irritable, decreased interest and sad.  Associated symptoms include no decreased concentration, no restlessness, no appetite change, no body aches, no myalgias, no headaches, no indigestion and no suicidal ideas.     The symptoms are aggravated by nothing.  Past treatments include nothing.  Compliance with treatment is variable.  Previous treatment provided mild relief.   Review of Systems  Constitutional: Positive for fatigue. Negative for appetite change, chills, fever and unexpected weight change.  HENT: Negative for congestion, ear discharge, ear pain, rhinorrhea, sinus pressure, sneezing and sore throat.   Eyes: Negative for photophobia, pain, discharge, redness and itching.  Respiratory: Negative for cough, shortness of breath, wheezing and stridor.   Gastrointestinal: Negative for abdominal pain, blood in stool, constipation, diarrhea, nausea and vomiting.  Endocrine: Negative for cold intolerance, heat intolerance, polydipsia, polyphagia and polyuria.  Genitourinary: Negative for dysuria, flank pain, frequency, hematuria, menstrual problem, pelvic pain, urgency, vaginal bleeding and vaginal discharge.  Musculoskeletal: Negative for arthralgias, back pain and myalgias.  Skin: Negative for rash.  Allergic/Immunologic: Negative for environmental allergies and food allergies.  Neurological: Negative for dizziness, weakness, light-headedness, numbness and headaches.  Hematological: Negative for adenopathy. Does  not bruise/bleed easily.  Psychiatric/Behavioral: Positive for depression. Negative for decreased concentration, dysphoric mood and suicidal ideas. The patient has insomnia. The patient is not nervous/anxious.     Patient Active Problem List   Diagnosis Date Noted  . 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 Goals of care, counseling/discussion 09/18/2017  . Ductal carcinoma in situ (DCIS) of left breast 07/22/2017  . Sacroiliitis ([place]) 02/04/2017  . Dilated cardiomyopathy ([place]) 05/02/2016  . Bradycardia 04/04/2016  . A-fib ([place]) 03/20/2016  . Acute dyspnea 02/20/2016  . 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 Arthritis 06/06/2014  . Aggrieved 06/06/2014  . Dermatitis, eczematoid 06/06/2014  . Recurrent major depressive episodes ([place]) 06/06/2014  . Essential (primary) hypertension 06/06/2014  . Gastro-esophageal reflux disease without esophagitis 06/06/2014  . Cannot sleep 06/06/2014  . Menopause 06/06/2014    No Known Allergies  Past Surgical History:  Procedure Laterality Date  . BREAST LUMPECTOMY Right 07/09/2017  . BREAST LUMPECTOMY Left 2006  . BREAST LUMPECTOMY WITH NEEDLE LOCALIZATION Right 07/09/2017   Procedure: BREAST LUMPECTOMY WITH NEEDLE LOCALIZATION;  Surgeon: Herbert Pun, MD;  Location: [facility] ORS;  Service: General;  Laterality: Right;  . BREAST SURGERY     left breast lumpectomy  . CARDIOVERSION N/A 03/21/2016   Procedure: Cardioversion;  Surgeon: Corey Skains, MD;  Location: [facility] ORS;  Service: Cardiovascular;  Laterality: N/A;  . CATARACT EXTRACTION, BILATERAL    . LAPAROSCOPIC CHOLECYSTECTOMY    . TONSILLECTOMY    . VAGINAL HYSTERECTOMY      Social History   Tobacco Use  . Smoking status: Never Smoker  . Smokeless tobacco: Never Used  Substance Use Topics  . Alcohol use: Never    Frequency: Never  . Drug use: No  Medication list has been reviewed  and updated.  Current Meds  Medication Sig  . alendronate (FOSAMAX) 70 MG tablet TAKE 1 TABLET BY MOUTH ONCE A WEEK. TAKE WITH A FULL GLASS OF WATER ON AN EMPTY STOMACH.  Marland Kitchen apixaban (ELIQUIS) 5 MG TABS tablet Take 5 mg by mouth 2 (two) times daily.  . calcium-vitamin D (OSCAL WITH D) 500-200 MG-UNIT TABS tablet Take 1 tablet by mouth 2 (two) times daily.   . hydrochlorothiazide (MICROZIDE) 12.5 MG capsule TAKE 1 CAPSULE BY MOUTH EVERY DAY  . Melatonin 10 MG TABS Take 20 mg by mouth at bedtime.   . metoprolol succinate (TOPROL-XL) 100 MG 24 hr tablet Take 1 tablet (100 mg total) by mouth daily. Take with or immediately following a meal.  . nystatin (MYCOSTATIN) 100000 UNIT/ML suspension Take 5 mLs by mouth 4 (four) times daily as needed (for mouth sores).  Marland Kitchen omeprazole (PRILOSEC) 40 MG capsule TAKE 1 CAPSULE BY MOUTH EVERY DAY  . pravastatin (PRAVACHOL) 20 MG tablet Take 1 tablet (20 mg total) by mouth daily.  . sertraline (ZOLOFT) 100 MG tablet Take 1 tablet (100 mg total) by mouth daily.  . tamoxifen (NOLVADEX) 20 MG tablet TAKE 1 TABLET BY MOUTH EVERY DAY    PHQ 2/9 Scores 08/21/2018 07/16/2018 07/22/2017 02/04/2017  PHQ - 2 Score 0 0 0 6  PHQ- 9 Score 8 0 - 16    BP Readings from Last 3 Encounters:  08/21/18 90/62  08/06/18 132/64  08/06/18 132/64    Physical Exam Vitals signs and nursing note reviewed.  Constitutional:      General: She is irritable.     Appearance: She is well-developed.  HENT:     Head: Normocephalic.     Right Ear: Tympanic membrane, ear canal and external ear normal.     Left Ear: Tympanic membrane, ear canal and external ear normal.  Eyes:     General: Lids are everted, no foreign bodies appreciated. No scleral icterus.       Left eye: No foreign body or hordeolum.     Conjunctiva/sclera: Conjunctivae normal.     Right eye: Right conjunctiva is not injected.     Left eye: Left conjunctiva is not injected.     Pupils: Pupils are equal, round, and  reactive to light.  Neck:     Musculoskeletal: Normal range of motion and neck supple.     Thyroid: No thyromegaly.     Vascular: No JVD.     Trachea: No tracheal deviation.  Cardiovascular:     Rate and Rhythm: Normal rate and regular rhythm.     Heart sounds: Normal heart sounds. No murmur. No friction rub. No gallop.   Pulmonary:     Effort: Pulmonary effort is normal. No respiratory distress.     Breath sounds: Normal breath sounds. No wheezing or rales.  Abdominal:     General: Bowel sounds are normal.     Palpations: Abdomen is soft. There is no hepatomegaly, splenomegaly or mass.     Tenderness: There is no abdominal tenderness. There is no guarding or rebound.  Musculoskeletal: Normal range of motion.        General: No tenderness.  Lymphadenopathy:     Cervical: No cervical adenopathy.  Skin:    General: Skin is warm.     Findings: No rash.  Neurological:     Mental Status: She is alert and oriented to person, place, and time.     Cranial Nerves: No  cranial nerve deficit.     Deep Tendon Reflexes: Reflexes normal.  Psychiatric:        Mood and Affect: Mood is not anxious or depressed.     Wt Readings from Last 3 Encounters:  08/21/18 225 lb (102.1 kg)  08/06/18 231 lb 0.7 oz (104.8 kg)  08/06/18 231 lb (104.8 kg)    BP 90/62   Pulse 88   Ht 5' 5.5" (1.664 m)   Wt 225 lb (102.1 kg)   BMI 36.87 kg/m   Assessment and Plan: 1. Anorexia Patient has had recent onset of anorexia associated with some suggestion of dysphasia.  With concerns that something may be going on of an occult possibility because of the weight loss and early satiety it was suggested to the patient that we may want to see GI which she refused.  It was suggested to the patient that we could do a barium swallow upper GI which she refused.  I told her that I would discuss with Dr. Janese Banks that she is having some of the symptoms and for her to have an awareness of them at her next visit.  Patient may be  depressed because she has multiple concerning things going on in her life but today is somewhat frustrating in her nonchalant concern. - sucralfate (CARAFATE) 1 g tablet; Take 1 tablet (1 g total) by mouth 2 (two) times daily.  Dispense: 30 tablet; Refill: 1  2. Early satiety Says she takes just a few bites and she does not feel like eating anything else.  Will initiate sucralfate 1 g tablet twice a day - sucralfate (CARAFATE) 1 g tablet; Take 1 tablet (1 g total) by mouth 2 (two) times daily.  Dispense: 30 tablet; Refill: 1  3. Weight loss, non-intentional Patient was noted to have a 14 pound weight loss since January.  This was brought to her attention and she does not seem to care about pursuing further investigation  4. Depression, unspecified depression type Patient does have multiple reasons for reactive depression.  She is currently on sertraline 100 mg once a day and I have encouraged her to continue this.  PHQ 9 was 8 and gad was 3.  5. Ductal carcinoma in situ (DCIS) of left breast Patient does have a history of ductal carcinoma in situ which is unlikely to be the reason for this without the spread but perhaps when Dr. Janese Banks sees her in September she will also bring to her attention her weight loss and that there may be something that needs to be further investigated in the GI situation.

## 2018-08-27 ENCOUNTER — Telehealth: Payer: Self-pay | Admitting: *Deleted

## 2018-08-27 NOTE — Telephone Encounter (Signed)
Dr. Ronnald Ramp wanted to talk to Dr. Janese Banks about this pt. She has been loosing wt and daughter spoke to me on phone yest. And said her mom will not do the things she is suppose to . Tara at Dr. Ronnald Ramp office states that she has spoke to pt and told her she has to try to do right thing and she is concerned about her cancer returning in, I gave Baxter Flattery Dr. Janese Banks cell phone so that Janese Banks and Ronnald Ramp can talk about this pt.

## 2018-08-27 NOTE — Telephone Encounter (Signed)
-----   Message from [place], [place] sent at 08/27/2018  3:31 PM EDT ----- Acquanetta Belling this message, not sure why it was sent to me. Pt was recently seen by Dr. Janese Banks and her PCP, Dr. Martyn Ehrich ----- Message ----- From: Gerhard Perches, RN Sent: 08/27/2018  12:39 PM EDT To: Telford Nab, RN, Vito Berger, CMA  Reina Fuse,  I received this message my mistake. The lady who sent it to me sent it to the wrong [facility].   ----- Message ----- From: Fredderick Severance Sent: 08/21/2018   2:54 PM EDT To: Gerhard Perches, RN  Please call concerning this pt- [phone]

## 2018-08-31 ENCOUNTER — Telehealth: Payer: Self-pay | Admitting: *Deleted

## 2018-08-31 NOTE — Telephone Encounter (Signed)
Left message with pt that Dr. Ronnald Ramp had called Dr. Janese Banks wants her to be seen in the office. Have appt. For tom. tues 8/11. I will try calling her back at lunch to see if she can come

## 2018-09-01 ENCOUNTER — Telehealth: Payer: Self-pay | Admitting: *Deleted

## 2018-09-01 NOTE — Telephone Encounter (Signed)
I called the house and got voicemail so since I called yest. I thought I would try her daughter. I did get a message today to call her back from Dr. Janese Banks . I spoke to daughter Melissa Chase and she says she can bring her Thursday. She needs afternoon appt due to it takes her so long to get up and ready and she is weak. So [name] said 1:45 would be fine for 8/13. She will call her mom and let her know.

## 2018-09-03 ENCOUNTER — Inpatient Hospital Stay: Payer: Medicare Other | Attending: Oncology | Admitting: Oncology

## 2018-09-03 ENCOUNTER — Other Ambulatory Visit: Payer: Self-pay

## 2018-09-03 ENCOUNTER — Encounter: Payer: Self-pay | Admitting: Oncology

## 2018-09-03 ENCOUNTER — Inpatient Hospital Stay: Payer: Medicare Other

## 2018-09-03 VITALS — BP 119/75 | HR 65 | Temp 98.8°F | Resp 18 | Wt 226.8 lb

## 2018-09-03 DIAGNOSIS — I10 Essential (primary) hypertension: Secondary | ICD-10-CM | POA: Insufficient documentation

## 2018-09-03 DIAGNOSIS — Z7901 Long term (current) use of anticoagulants: Secondary | ICD-10-CM | POA: Insufficient documentation

## 2018-09-03 DIAGNOSIS — D0511 Intraductal carcinoma in situ of right breast: Secondary | ICD-10-CM

## 2018-09-03 DIAGNOSIS — R634 Abnormal weight loss: Secondary | ICD-10-CM

## 2018-09-03 DIAGNOSIS — R630 Anorexia: Secondary | ICD-10-CM | POA: Diagnosis not present

## 2018-09-03 DIAGNOSIS — Z79810 Long term (current) use of selective estrogen receptor modulators (SERMs): Secondary | ICD-10-CM | POA: Diagnosis not present

## 2018-09-03 DIAGNOSIS — Z79899 Other long term (current) drug therapy: Secondary | ICD-10-CM | POA: Diagnosis not present

## 2018-09-03 DIAGNOSIS — R1031 Right lower quadrant pain: Secondary | ICD-10-CM

## 2018-09-03 DIAGNOSIS — I4891 Unspecified atrial fibrillation: Secondary | ICD-10-CM | POA: Insufficient documentation

## 2018-09-03 LAB — CBC WITH DIFFERENTIAL/PLATELET
Abs Immature Granulocytes: 0.01 10*3/uL (ref 0.00–0.07)
Basophils Absolute: 0 10*3/uL (ref 0.0–0.1)
Basophils Relative: 1 %
Eosinophils Absolute: 0.1 10*3/uL (ref 0.0–0.5)
Eosinophils Relative: 2 %
HCT: 36.7 % (ref 36.0–46.0)
Hemoglobin: 12.3 g/dL (ref 12.0–15.0)
Immature Granulocytes: 0 %
Lymphocytes Relative: 25 %
Lymphs Abs: 1.5 10*3/uL (ref 0.7–4.0)
MCH: 30.2 pg (ref 26.0–34.0)
MCHC: 33.5 g/dL (ref 30.0–36.0)
MCV: 90.2 fL (ref 80.0–100.0)
Monocytes Absolute: 0.8 10*3/uL (ref 0.1–1.0)
Monocytes Relative: 13 %
Neutro Abs: 3.6 10*3/uL (ref 1.7–7.7)
Neutrophils Relative %: 59 %
Platelets: 163 10*3/uL (ref 150–400)
RBC: 4.07 MIL/uL (ref 3.87–5.11)
RDW: 12.2 % (ref 11.5–15.5)
WBC: 6 10*3/uL (ref 4.0–10.5)
nRBC: 0 % (ref 0.0–0.2)

## 2018-09-03 LAB — TSH: TSH: 2.21 u[IU]/mL (ref 0.350–4.500)

## 2018-09-03 LAB — COMPREHENSIVE METABOLIC PANEL
ALT: 17 U/L (ref 0–44)
AST: 25 U/L (ref 15–41)
Albumin: 3.5 g/dL (ref 3.5–5.0)
Alkaline Phosphatase: 39 U/L (ref 38–126)
Anion gap: 8 (ref 5–15)
BUN: 13 mg/dL (ref 8–23)
CO2: 29 mmol/L (ref 22–32)
Calcium: 8.9 mg/dL (ref 8.9–10.3)
Chloride: 107 mmol/L (ref 98–111)
Creatinine, Ser: 0.98 mg/dL (ref 0.44–1.00)
GFR calc Af Amer: >60 mL/min (ref >60)
GFR calc non Af Amer: 56 mL/min — ABNORMAL LOW (ref >60)
Glucose, Bld: 114 mg/dL — ABNORMAL HIGH (ref 70–99)
Potassium: 3.5 mmol/L (ref 3.5–5.1)
Sodium: 144 mmol/L (ref 135–145)
Total Bilirubin: 0.6 mg/dL (ref 0.3–1.2)
Total Protein: 6.5 g/dL (ref 6.5–8.1)

## 2018-09-03 NOTE — Progress Notes (Signed)
Pt in per Dr Ronnald Ramp request, pt seen in her office 3 weeks ago with nausea and unable to eat. States Dr Ronnald Ramp "thought she felt something in patient"s right side/abdominal area".   Pt still weak but improving slowly.

## 2018-09-04 NOTE — Progress Notes (Signed)
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 At that time she underwent a lumpectomy followed by radiation treatment and was on tamoxifen for 5 years. Subsequently she has been undergoing regular screening mammograms. She self palpated a mass in the right subareolar region. This was followed by a mammogram and ultrasound in May 2019 which revealed a solid 1.1 x 0.9 x 0.5 cm right breast mass. No evidence of right axillary adenopathy. 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 Consideration includes DCIS involving a papilloma and encapsulated papillary carcinoma. Patient underwent lumpectomy on 07/09/2017 by Dr. Peyton Najjar which showed a 12 mm grade 1-2 DCIS with involvement of pre-existing papilloma and adjacent ducts. Lateral excision revealed atypical ductal hyperplasia. Margins were negative. Tumor was greater than ER 90% positive and greater than 90% PR positive. Pathologic stage Tis NX. Patientcompleted adjuvant RT and was started on arimidex  Interval history-patient states that he has been feeling fatigued and has lost the desire to eat over the last 2 to 3 weeks.  She does not report actual difficulty swallowing or nausea.  States that she does not have much of an appetite and also reports early satiety due to which she has been taking small meals daily.  States that her mood is good overall and after many years she has had a female partner that she is  looking forward to.  She reports mild abdominal discomfort in the right lower quadrant.  Bowel movements have been regular.  She denies any fever cough shortness of breath or exertional chest pain  ECOG PS- 1 Pain scale- 0 Opioid associated constipation- no  Review of systems- Review of Systems  Constitutional: Positive for malaise/fatigue and weight loss. Negative for chills and fever.       Lack of appetite  HENT: Negative for congestion, ear discharge and nosebleeds.   Eyes: Negative for blurred vision.  Respiratory: Negative for cough, hemoptysis, sputum production, shortness of breath and wheezing.   Cardiovascular: Negative for chest pain, palpitations, orthopnea and claudication.  Gastrointestinal: Negative for abdominal pain, blood in stool, constipation, diarrhea, heartburn, melena, nausea and vomiting.  Genitourinary: Negative for dysuria, flank pain, frequency, hematuria and urgency.  Musculoskeletal: Negative for back pain, joint pain and myalgias.  Skin: Negative for rash.  Neurological: Negative for dizziness, tingling, focal weakness, seizures, weakness and headaches.  Endo/Heme/Allergies: Does not bruise/bleed easily.  Psychiatric/Behavioral: Negative for depression and suicidal ideas. The patient does not have insomnia.        No Known Allergies   Past Medical History:  Diagnosis Date  . Afib ([place])   . Arthritis   . Breast cancer ([place]) 2006   left breast  . Breast cancer ([place]) 2019   right breast  . Depression   . GERD (gastroesophageal reflux disease)   . Hypertension   . Insomnia   . Insomnia   . Personal history of radiation therapy 2006   36 tx.  . Personal history of radiation therapy 2019     Past Surgical History:  Procedure Laterality Date  . BREAST LUMPECTOMY Right 07/09/2017  . BREAST LUMPECTOMY Left 2006  . BREAST LUMPECTOMY WITH NEEDLE LOCALIZATION Right 07/09/2017   Procedure: BREAST LUMPECTOMY WITH NEEDLE LOCALIZATION;  Surgeon:  Herbert Pun, MD;  Location: [facility] ORS;  Service: General;  Laterality: Right;  . BREAST SURGERY     left breast lumpectomy  . CARDIOVERSION N/A 03/21/2016   Procedure: Cardioversion;  Surgeon: Corey Skains, MD;  Location: [facility] ORS;  Service: Cardiovascular;  Laterality: N/A;  . CATARACT EXTRACTION, BILATERAL    . LAPAROSCOPIC CHOLECYSTECTOMY    . TONSILLECTOMY    . VAGINAL HYSTERECTOMY      Social History   Socioeconomic History  . Marital status: Widowed    Spouse name: Not on file  . Number of children: Not on file  . Years of education: Not on file  . Highest education level: Not on file  Occupational History  . Not on file  Social Needs  . Financial resource strain: Not on file  . Food insecurity    Worry: Not on file    Inability: Not on file  . Transportation needs    Medical: Not on file    Non-medical: Not on file  Tobacco Use  . Smoking status: Never Smoker  . Smokeless tobacco: Never Used  Substance and Sexual Activity  . Alcohol use: Never    Frequency: Never  . Drug use: No  . Sexual activity: Not Currently  Lifestyle  . Physical activity    Days per week: Not on file    Minutes per session: Not on file  . Stress: Not on file  Relationships  . Social Herbalist on phone: Not on file    Gets together: Not on file    Attends religious service: Not on file    Active member of club or organization: Not on file    Attends meetings of clubs or organizations: Not on file    Relationship status: Not on file  . Intimate partner violence    Fear of current or ex partner: Not on file    Emotionally abused: Not on file    Physically abused: Not on file    Forced sexual activity: Not on file  Other Topics Concern  . Not on file  Social History Narrative  . Not on file    Family History  Problem Relation Age of Onset  . Diabetes Sister   . Breast cancer Daughter      Current Outpatient Medications:  .  alendronate (FOSAMAX) 70 MG  tablet, TAKE 1 TABLET BY MOUTH ONCE A WEEK. TAKE WITH A FULL GLASS OF WATER ON AN EMPTY STOMACH., Disp: 12 tablet, Rfl: 1 .  apixaban (ELIQUIS) 5 MG TABS tablet, Take 5 mg by mouth 2 (two) times daily., Disp: , Rfl:  .  calcium-vitamin D (OSCAL WITH D) 500-200 MG-UNIT TABS tablet, Take 1 tablet by mouth 2 (two) times daily. , Disp: , Rfl:  .  hydrochlorothiazide (MICROZIDE) 12.5 MG capsule, TAKE 1 CAPSULE BY MOUTH EVERY DAY, Disp: 90 capsule, Rfl: 1 .  Melatonin 10 MG TABS, Take 20 mg by mouth at bedtime. , Disp: , Rfl:  .  metoprolol succinate (TOPROL-XL) 100 MG 24 hr tablet, Take 1 tablet (100 mg total) by mouth daily. Take with or immediately following a meal., Disp: 90 tablet, Rfl: 1 .  nystatin (MYCOSTATIN) 100000 UNIT/ML suspension, Take 5 mLs by mouth 4 (four) times daily as needed (for mouth  sores)., Disp: , Rfl:  .  omeprazole (PRILOSEC) 40 MG capsule, TAKE 1 CAPSULE BY MOUTH EVERY DAY, Disp: 90 capsule, Rfl: 1 .  pravastatin (PRAVACHOL) 20 MG tablet, Take 1 tablet (20 mg total) by mouth daily., Disp: 90 tablet, Rfl: 1 .  sertraline (ZOLOFT) 100 MG tablet, Take 1 tablet (100 mg total) by mouth daily., Disp: 90 tablet, Rfl: 1 .  sucralfate (CARAFATE) 1 g tablet, Take 1 tablet (1 g total) by mouth 2 (two) times daily., Disp: 30 tablet, Rfl: 1 .  tamoxifen (NOLVADEX) 20 MG tablet, TAKE 1 TABLET BY MOUTH EVERY DAY, Disp: 90 tablet, Rfl: 1  Physical exam:  Vitals:   09/03/18 1408  BP: 119/75  Pulse: 65  Resp: 18  Temp: 98.8 F (37.1 C)  TempSrc: Tympanic  SpO2: 95%  Weight: 226 lb 12.8 oz (102.9 kg)   Physical Exam Constitutional:      General: She is not in acute distress. HENT:     Head: Normocephalic and atraumatic.  Eyes:     Pupils: Pupils are equal, round, and reactive to light.  Neck:     Musculoskeletal: Normal range of motion.  Cardiovascular:     Rate and Rhythm: Normal rate and regular rhythm.     Heart sounds: Normal heart sounds.  Pulmonary:     Effort:  Pulmonary effort is normal.     Breath sounds: Normal breath sounds.  Abdominal:     General: Bowel sounds are normal.     Palpations: Abdomen is soft.     Comments: Mild tenderness to palpation in the right middle quadrant. No RLQ tenderness  Lymphadenopathy:     Comments: No palpable cervical, supraclavicular, axillary or inguinal adenopathy   Skin:    General: Skin is warm and dry.  Neurological:     Mental Status: She is alert and oriented to person, place, and time.      CMP Latest Ref Rng & Units 09/03/2018  Glucose 70 - 99 mg/dL 114(H)  BUN 8 - 23 mg/dL 13  Creatinine 0.44 - 1.00 mg/dL 0.98  Sodium 135 - 145 mmol/L 144  Potassium 3.5 - 5.1 mmol/L 3.5  Chloride 98 - 111 mmol/L 107  CO2 22 - 32 mmol/L 29  Calcium 8.9 - 10.3 mg/dL 8.9  Total Protein 6.5 - 8.1 g/dL 6.5  Total Bilirubin 0.3 - 1.2 mg/dL 0.6  Alkaline Phos 38 - 126 U/L 39  AST 15 - 41 U/L 25  ALT 0 - 44 U/L 17   CBC Latest Ref Rng & Units 09/03/2018  WBC 4.0 - 10.5 K/uL 6.0  Hemoglobin 12.0 - 15.0 g/dL 12.3  Hematocrit 36.0 - 46.0 % 36.7  Platelets 150 - 400 K/uL 163      Assessment and plan- Patient is a 78 y.o. female with history of right breast DCIS on tamoxifen.  This is an acute visit for ongoing lack of appetite and weight loss  Patient weighed about 241 pounds last year and her current weight is down to 226 pounds.  Patient reports that this has been unintentional.  She has been particularly feeling worse over the last 2 weeks.  Reports early satiety and lack of appetite.  Denies any difficulty swallowing food.  I do not feel that these symptoms are related to her DCIS but given her ongoing symptoms I would like to get a CT chest abdomen pelvis to rule out any malignancy.  I will check a CBC with differential, CMP and TSH today.  I will see her back after the results of her CT scan.  If CT scan shows no evidence of malignancy I will send her to GI for further evaluation   Visit Diagnosis 1. Loss of  appetite   2. Abnormal weight loss   3. Right lower quadrant abdominal pain   4. Ductal carcinoma in situ (DCIS) of right breast      Dr. Randa Evens, MD, MPH [facility] [phone] 09/04/2018 9:53 AM

## 2018-09-17 ENCOUNTER — Ambulatory Visit: Admission: RE | Admit: 2018-09-17 | Payer: Medicare Other | Source: Ambulatory Visit

## 2018-09-17 ENCOUNTER — Other Ambulatory Visit: Payer: Self-pay

## 2018-09-17 ENCOUNTER — Ambulatory Visit
Admission: RE | Admit: 2018-09-17 | Discharge: 2018-09-17 | Disposition: A | Discharge status: Home / Self Care | Payer: Medicare Other | Source: Ambulatory Visit | Attending: Oncology | Admitting: Oncology

## 2018-09-17 DIAGNOSIS — R1031 Right lower quadrant pain: Secondary | ICD-10-CM | POA: Insufficient documentation

## 2018-09-17 DIAGNOSIS — K5730 Diverticulosis of large intestine without perforation or abscess without bleeding: Secondary | ICD-10-CM | POA: Diagnosis not present

## 2018-09-17 DIAGNOSIS — R634 Abnormal weight loss: Secondary | ICD-10-CM | POA: Insufficient documentation

## 2018-09-17 DIAGNOSIS — K449 Diaphragmatic hernia without obstruction or gangrene: Secondary | ICD-10-CM | POA: Diagnosis not present

## 2018-09-17 DIAGNOSIS — R630 Anorexia: Secondary | ICD-10-CM | POA: Diagnosis not present

## 2018-09-17 DIAGNOSIS — J9811 Atelectasis: Secondary | ICD-10-CM | POA: Diagnosis not present

## 2018-09-17 DIAGNOSIS — D0511 Intraductal carcinoma in situ of right breast: Secondary | ICD-10-CM | POA: Insufficient documentation

## 2018-09-17 MED ORDER — IOHEXOL 300 MG/ML  SOLN
100.0000 mL | Freq: Once | INTRAMUSCULAR | Status: AC | PRN
  Administered 2018-09-17: 100 mL via INTRAVENOUS

## 2018-09-18 ENCOUNTER — Encounter: Payer: Self-pay | Admitting: Oncology

## 2018-09-18 ENCOUNTER — Inpatient Hospital Stay (HOSPITAL_BASED_OUTPATIENT_CLINIC_OR_DEPARTMENT_OTHER): Payer: Medicare Other | Admitting: Oncology

## 2018-09-18 ENCOUNTER — Other Ambulatory Visit: Payer: Self-pay

## 2018-09-18 VITALS — BP 123/76 | HR 62 | Temp 98.6°F | Resp 18 | Wt 223.7 lb

## 2018-09-18 DIAGNOSIS — Z79810 Long term (current) use of selective estrogen receptor modulators (SERMs): Secondary | ICD-10-CM | POA: Diagnosis not present

## 2018-09-18 DIAGNOSIS — R634 Abnormal weight loss: Secondary | ICD-10-CM | POA: Diagnosis not present

## 2018-09-18 DIAGNOSIS — R630 Anorexia: Secondary | ICD-10-CM | POA: Diagnosis not present

## 2018-09-18 DIAGNOSIS — D0511 Intraductal carcinoma in situ of right breast: Secondary | ICD-10-CM | POA: Diagnosis not present

## 2018-09-18 DIAGNOSIS — I10 Essential (primary) hypertension: Secondary | ICD-10-CM | POA: Diagnosis not present

## 2018-09-18 DIAGNOSIS — Z79899 Other long term (current) drug therapy: Secondary | ICD-10-CM | POA: Diagnosis not present

## 2018-09-18 NOTE — Progress Notes (Signed)
Pt here for follow up. Offers no complaints.

## 2018-09-19 NOTE — Progress Notes (Signed)
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 At that time she underwent a lumpectomy followed by radiation treatment and was on tamoxifen for 5 years. Subsequently she has been undergoing regular screening mammograms. She self palpated a mass in the right subareolar region. This was followed by a mammogram and ultrasound in May 2019 which revealed a solid 1.1 x 0.9 x 0.5 cm right breast mass. No evidence of right axillary adenopathy. 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 Consideration includes DCIS involving a papilloma and encapsulated papillary carcinoma. Patient underwent lumpectomy on 07/09/2017 by Dr. Peyton Najjar which showed a 12 mm grade 1-2 DCIS with involvement of pre-existing papilloma and adjacent ducts. Lateral excision revealed atypical ductal hyperplasia. Margins were negative. Tumor was greater than ER 90% positive and greater than 90% PR positive. Pathologic stage Tis NX. Patientcompleted adjuvant RT and was started on arimidex. She could not tolerate arimidex and was switched to tamoxifen  Interval history- she has good days and bad days as far as her appetite is concerned. There are days when she eats only 1 meal a day.   ECOG PS- 1 Pain scale- 0   Review of systems- Review of Systems  Constitutional: Positive for malaise/fatigue and weight loss. Negative for chills and fever.       Lack of appetite  HENT: Negative for congestion, ear discharge and nosebleeds.   Eyes: Negative  for blurred vision.  Respiratory: Negative for cough, hemoptysis, sputum production, shortness of breath and wheezing.   Cardiovascular: Negative for chest pain, palpitations, orthopnea and claudication.  Gastrointestinal: Negative for abdominal pain, blood in stool, constipation, diarrhea, heartburn, melena, nausea and vomiting.  Genitourinary: Negative for dysuria, flank pain, frequency, hematuria and urgency.  Musculoskeletal: Negative for back pain, joint pain and myalgias.  Skin: Negative for rash.  Neurological: Negative for dizziness, tingling, focal weakness, seizures, weakness and headaches.  Endo/Heme/Allergies: Does not bruise/bleed easily.  Psychiatric/Behavioral: Negative for depression and suicidal ideas. The patient does not have insomnia.       No Known Allergies   Past Medical History:  Diagnosis Date   Afib ([place])    Arthritis    Breast cancer ([place]) 2006   left breast   Breast cancer ([place]) 2019   right breast   Depression    GERD (gastroesophageal reflux disease)    Hypertension    Insomnia    Insomnia    Personal history of radiation therapy 2006   37 tx.   Personal history of radiation therapy 2019     Past Surgical History:  Procedure Laterality Date   BREAST LUMPECTOMY Right 07/09/2017   BREAST LUMPECTOMY Left 2006   BREAST LUMPECTOMY WITH NEEDLE LOCALIZATION Right 07/09/2017   Procedure: BREAST LUMPECTOMY WITH NEEDLE LOCALIZATION;  Surgeon: Herbert Pun, MD;  Location: [facility] ORS;  Service: General;  Laterality: Right;   BREAST SURGERY     left breast lumpectomy   CARDIOVERSION N/A 03/21/2016   Procedure: Cardioversion;  Surgeon: Corey Skains, MD;  Location: [facility] ORS;  Service: Cardiovascular;  Laterality: N/A;   CATARACT  EXTRACTION, BILATERAL     LAPAROSCOPIC CHOLECYSTECTOMY     TONSILLECTOMY     VAGINAL HYSTERECTOMY      Social History   Socioeconomic History   Marital status: Widowed    Spouse name: Not on  file   Number of children: Not on file   Years of education: Not on file   Highest education level: Not on file  Occupational History   Not on file  Social Needs   Financial resource strain: Not on file   Food insecurity    Worry: Not on file    Inability: Not on file   Transportation needs    Medical: Not on file    Non-medical: Not on file  Tobacco Use   Smoking status: Never Smoker   Smokeless tobacco: Never Used  Substance and Sexual Activity   Alcohol use: Never    Frequency: Never   Drug use: No   Sexual activity: Not Currently  Lifestyle   Physical activity    Days per week: Not on file    Minutes per session: Not on file   Stress: Not on file  Relationships   Social connections    Talks on phone: Not on file    Gets together: Not on file    Attends religious service: Not on file    Active member of club or organization: Not on file    Attends meetings of clubs or organizations: Not on file    Relationship status: Not on file   Intimate partner violence    Fear of current or ex partner: Not on file    Emotionally abused: Not on file    Physically abused: Not on file    Forced sexual activity: Not on file  Other Topics Concern   Not on file  Social History Narrative   Not on file    Family History  Problem Relation Age of Onset   Diabetes Sister    Breast cancer Daughter      Current Outpatient Medications:    alendronate (FOSAMAX) 70 MG tablet, TAKE 1 TABLET BY MOUTH ONCE A WEEK. TAKE WITH A FULL GLASS OF WATER ON AN EMPTY STOMACH., Disp: 12 tablet, Rfl: 1   apixaban (ELIQUIS) 5 MG TABS tablet, Take 5 mg by mouth 2 (two) times daily., Disp: , Rfl:    calcium-vitamin D (OSCAL WITH D) 500-200 MG-UNIT TABS tablet, Take 1 tablet by mouth 2 (two) times daily. , Disp: , Rfl:    hydrochlorothiazide (MICROZIDE) 12.5 MG capsule, TAKE 1 CAPSULE BY MOUTH EVERY DAY, Disp: 90 capsule, Rfl: 1   Melatonin 10 MG TABS, Take 20 mg by mouth at  bedtime. , Disp: , Rfl:    metoprolol succinate (TOPROL-XL) 100 MG 24 hr tablet, Take 1 tablet (100 mg total) by mouth daily. Take with or immediately following a meal., Disp: 90 tablet, Rfl: 1   nystatin (MYCOSTATIN) 100000 UNIT/ML suspension, Take 5 mLs by mouth 4 (four) times daily as needed (for mouth sores)., Disp: , Rfl:    omeprazole (PRILOSEC) 40 MG capsule, TAKE 1 CAPSULE BY MOUTH EVERY DAY, Disp: 90 capsule, Rfl: 1   pravastatin (PRAVACHOL) 20 MG tablet, Take 1 tablet (20 mg total) by mouth daily., Disp: 90 tablet, Rfl: 1   sertraline (ZOLOFT) 100 MG tablet, Take 1 tablet (100 mg total) by mouth daily., Disp: 90 tablet, Rfl: 1   sucralfate (CARAFATE) 1 g tablet, Take 1 tablet (1 g total) by mouth 2 (two) times daily., Disp:  30 tablet, Rfl: 1   tamoxifen (NOLVADEX) 20 MG tablet, TAKE 1 TABLET BY MOUTH EVERY DAY, Disp: 90 tablet, Rfl: 1  Physical exam:  Vitals:   09/18/18 1405  BP: 123/76  Pulse: 62  Resp: 18  Temp: 98.6 F (37 C)  TempSrc: Tympanic  Weight: 223 lb 11.2 oz (101.5 kg)   Physical Exam Constitutional:      General: She is not in acute distress.    Appearance: She is obese.  HENT:     Head: Normocephalic and atraumatic.  Eyes:     Pupils: Pupils are equal, round, and reactive to light.  Neck:     Musculoskeletal: Normal range of motion.  Cardiovascular:     Rate and Rhythm: Normal rate and regular rhythm.     Heart sounds: Normal heart sounds.  Pulmonary:     Effort: Pulmonary effort is normal.     Breath sounds: Normal breath sounds.  Abdominal:     General: Bowel sounds are normal.     Palpations: Abdomen is soft.  Skin:    General: Skin is warm and dry.  Neurological:     Mental Status: She is alert and oriented to person, place, and time.      CMP Latest Ref Rng & Units 09/03/2018  Glucose 70 - 99 mg/dL 114(H)  BUN 8 - 23 mg/dL 13  Creatinine 0.44 - 1.00 mg/dL 0.98  Sodium 135 - 145 mmol/L 144  Potassium 3.5 - 5.1 mmol/L 3.5  Chloride  98 - 111 mmol/L 107  CO2 22 - 32 mmol/L 29  Calcium 8.9 - 10.3 mg/dL 8.9  Total Protein 6.5 - 8.1 g/dL 6.5  Total Bilirubin 0.3 - 1.2 mg/dL 0.6  Alkaline Phos 38 - 126 U/L 39  AST 15 - 41 U/L 25  ALT 0 - 44 U/L 17   CBC Latest Ref Rng & Units 09/03/2018  WBC 4.0 - 10.5 K/uL 6.0  Hemoglobin 12.0 - 15.0 g/dL 12.3  Hematocrit 36.0 - 46.0 % 36.7  Platelets 150 - 400 K/uL 163    No images are attached to the encounter.  Ct Chest W Contrast  Result Date: 09/17/2018 CLINICAL DATA:  Weight loss, loss of appetite, right abdominal pain over the last 3 months. History of left and right breast cancer. EXAM: CT CHEST, ABDOMEN, AND PELVIS WITH CONTRAST TECHNIQUE: Multidetector CT imaging of the chest, abdomen and pelvis was performed following the standard protocol during bolus administration of intravenous contrast. CONTRAST:  146mL OMNIPAQUE IOHEXOL 300 MG/ML  SOLN COMPARISON:  Pelvic ultrasound of 2018 FINDINGS: CT CHEST FINDINGS Cardiovascular: Coronary, aortic arch, and branch vessel atherosclerotic vascular disease. Mild cardiomegaly. Mild ascending thoracic aortic aneurysm at 4.0 cm in diameter. Mediastinum/Nodes: Small type 1 hiatal hernia. Lungs/Pleura: Mild dependent subsegmental atelectasis the lower lobes. Musculoskeletal: Lumpectomy site in the right breast with a small cystic lesion with a fat fluid level. Thoracic spondylosis. CT ABDOMEN PELVIS FINDINGS Hepatobiliary: Cholecystectomy.  Otherwise unremarkable. Pancreas: Unremarkable Spleen: Unremarkable Adrenals/Urinary Tract: Unremarkable Stomach/Bowel: Air-levels in the distal colon, diarrheal process not excluded. No significant colonic wall thickening. There a few scattered diverticula primarily in the sigmoid colon. Vascular/Lymphatic: Aortoiliac atherosclerotic vascular disease. No pathologic adenopathy identified. Reproductive: Uterus absent. Fluid density 1.3 by 1.4 cm cystic lesion of the right ovary noted. This is smaller than that  measured on 07/30/2016. Given the benign appearance, no follow is required. Other: No supplemental non-categorized findings. Musculoskeletal: Partially sacralized L5. 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 Degenerative endplate findings primarily at T12-L1 and L1-2. IMPRESSION: 1. A cause for the patient's weight loss, loss of appetite, and right abdominal pain is not identified. 2. There are some air fluid levels in the distal colon which could indicate a diarrheal process. 3. Ascending thoracic aortic aneurysm at 4.0 cm in diameter. Recommend annual imaging followup by CTA or MRA. This recommendation follows 2010 ACCF/AHA/AATS/ACR/ASA/SCA/SCAI/SIR/STS/SVM Guidelines for the Diagnosis and Management of Patients with Thoracic Aortic Disease. Circulation. 2010; [id number]. Aortic aneurysm NOS (ICD10-I71.9). 4. Other imaging findings of potential clinical significance: Aortic Atherosclerosis (ICD10-I70.0). Coronary atherosclerosis. Mild cardiomegaly. Small type 1 hiatal hernia. Lumpectomy site in the right breast. Moderate central narrowing of the thecal sac at L3-4. Electronically Signed   By: Van Clines M.D.   On: 09/17/2018 17:33   Ct Abdomen Pelvis W Contrast  Result Date: 09/17/2018 CLINICAL DATA:  Weight loss, loss of appetite, right abdominal pain over the last 3 months. History of left and right breast cancer. EXAM: CT CHEST, ABDOMEN, AND PELVIS WITH CONTRAST TECHNIQUE: Multidetector CT imaging of the chest, abdomen and pelvis was performed following the standard protocol during bolus administration of intravenous contrast. CONTRAST:  183mL OMNIPAQUE IOHEXOL 300 MG/ML  SOLN COMPARISON:  Pelvic ultrasound of 2018 FINDINGS: CT CHEST FINDINGS Cardiovascular: Coronary, aortic arch, and branch vessel atherosclerotic vascular disease. Mild cardiomegaly. Mild ascending thoracic aortic aneurysm at 4.0 cm in  diameter. Mediastinum/Nodes: Small type 1 hiatal hernia. Lungs/Pleura: Mild dependent subsegmental atelectasis the lower lobes. Musculoskeletal: Lumpectomy site in the right breast with a small cystic lesion with a fat fluid level. Thoracic spondylosis. CT ABDOMEN PELVIS FINDINGS Hepatobiliary: Cholecystectomy.  Otherwise unremarkable. Pancreas: Unremarkable Spleen: Unremarkable Adrenals/Urinary Tract: Unremarkable Stomach/Bowel: Air-levels in the distal colon, diarrheal process not excluded. No significant colonic wall thickening. There a few scattered diverticula primarily in the sigmoid colon. Vascular/Lymphatic: Aortoiliac atherosclerotic vascular disease. No pathologic adenopathy identified. Reproductive: Uterus absent. Fluid density 1.3 by 1.4 cm cystic lesion of the right ovary noted. This is smaller than that measured on 07/30/2016. Given the benign appearance, no follow is required. Other: No supplemental non-categorized findings. Musculoskeletal: Partially sacralized L5. 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 Degenerative endplate findings primarily at T12-L1 and L1-2. IMPRESSION: 1. A cause for the patient's weight loss, loss of appetite, and right abdominal pain is not identified. 2. There are some air fluid levels in the distal colon which could indicate a diarrheal process. 3. Ascending thoracic aortic aneurysm at 4.0 cm in diameter. Recommend annual imaging followup by CTA or MRA. This recommendation follows 2010 ACCF/AHA/AATS/ACR/ASA/SCA/SCAI/SIR/STS/SVM Guidelines for the Diagnosis and Management of Patients with Thoracic Aortic Disease. Circulation. 2010; [id number]. Aortic aneurysm NOS (ICD10-I71.9). 4. Other imaging findings of potential clinical significance: Aortic Atherosclerosis (ICD10-I70.0). Coronary atherosclerosis. Mild cardiomegaly. Small type 1 hiatal hernia. Lumpectomy site in the right  breast. Moderate central narrowing of the thecal sac at L3-4. Electronically Signed   By: Van Clines M.D.   On: 09/17/2018 17:33     Assessment and plan- Patient is a 78 y.o. 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 I have discussed the ct chest abdomen pelvis results with the patient which does not reveal any obstruction or malignancy. Patient denies any mechanical difficulty swallowing food. Reports that  her appetite is not good and she feels full easily. She wants to work on her oral intake and would like to hold off on GI referral at this time. I have encouraged her to speak to Dr. Ronnald Ramp regarding this.  2. Right breast dcs- continue tamoxifen. I will see her back in 6 months  3. She will need AAA screening again in 1 year   Visit Diagnosis 1. Abnormal weight loss      Dr. Randa Evens, MD, MPH [facility] [id number] 09/19/2018 1:58 PM

## 2018-10-01 ENCOUNTER — Other Ambulatory Visit: Payer: Self-pay

## 2018-10-01 ENCOUNTER — Ambulatory Visit (INDEPENDENT_AMBULATORY_CARE_PROVIDER_SITE_OTHER): Payer: Medicare Other | Admitting: Family Medicine

## 2018-10-01 ENCOUNTER — Encounter: Payer: Self-pay | Admitting: Family Medicine

## 2018-10-01 VITALS — BP 136/72 | HR 63 | Ht 65.5 in | Wt 221.0 lb

## 2018-10-01 DIAGNOSIS — R634 Abnormal weight loss: Secondary | ICD-10-CM | POA: Diagnosis not present

## 2018-10-01 DIAGNOSIS — F339 Major depressive disorder, recurrent, unspecified: Secondary | ICD-10-CM | POA: Diagnosis not present

## 2018-10-01 DIAGNOSIS — I712 Thoracic aortic aneurysm, without rupture, unspecified: Secondary | ICD-10-CM

## 2018-10-01 NOTE — Patient Instructions (Signed)
Preventing Complications From Unhealthy Eating Behaviors, Adult Eating behaviors are influenced by many social, emotional, and psychological factors. Everyone has some unhealthy eating behaviors. These could be eating too much or too little, eating unhealthy foods, or eating at the wrong times. If you also struggle with weight management, it may be even harder to change patterns of irregular and unhealthy eating (disordered eating). Being overweight and having unhealthy eating behaviors may lead to dangerous health problems. Unhealthy eating behaviors may also be signs that you have a type of mental health issue that causes problems with healthy eating or weight regulation (eating disorder). What nutrition changes can be made? You can start changing unhealthy eating behaviors by making different food choices. Choices that you make about what to eat and drink are very important, especially if you want to lose weight. What to avoid:  Foods that contain a lot of fat, salt (sodium), or sugar. These include candy, donuts, pizza, and fast foods.  Fried or heavily processed foods.  Drinks that contain a lot of sugar. Healthy behaviors:   Eat a variety of healthy foods, including: ? Fruits and vegetables. ? Whole grains. ? Lean proteins. ? Low-fat dairy products.  Drink water instead of sugary drinks.  Plan healthy, low-calorie meals. Work with a Financial planner (dietitian) to make a healthy meal plan that works for you. What lifestyle changes can be made? You can also make certain lifestyle changes to help you change unhealthy eating behaviors. What to avoid:  Eating when you are: ? Not hungry. ? Bored. ? Stressed. ? Doing another activity, like watching television.  Eating late at night.  Following a diet that restricts entire types of food.  Skipping meals to save calories. It is especially important to eat breakfast.  Not eating anything for long periods of time (fasting).   Restricting your calories to far less than the amount that you need to lose or maintain a healthy weight.  Compulsively getting an extreme amount of exercise.  Eating an excessive amount of food (bingeing), then making yourself vomit (purging). Healthy behaviors:   Keep a food diary to help you see patterns of unhealthy eating behaviors and what triggers them.  Work with your health care provider or a dietitian to design an exercise program that works for you. ? To maintain your weight, get at least 150 minutes of moderate-intensity exercise every week. Moderate-intensity exercise could be brisk walking or biking. ? To lose a healthy amount of weight, get 60 minutes of moderate-intensity exercise each day.  Plan your meals ahead of time and prepare them at home.  Find ways to reduce stress, such as regular exercise or meditation.  Find a hobby or other activity that you enjoy to distract you from eating when you feel stressed or bored.  Eat your food slowly, and avoid distractions such as watching TV while you eat.  Get enough sleep each night.  Give yourself time to replace unhealthy eating behaviors with healthy ones. Why are these changes important? Making these changes will improve your overall health. Maintaining a healthy weight also lowers your risk of certain conditions, including:  Heart disease.  High cholesterol.  High blood pressure.  Type 2 diabetes.  Stroke.  Osteoarthritis.  Osteoporosis.  Some types of cancer.  Breathing and sleeping disorders. What can happen if changes are not made? You could develop health problems if you do not make these changes. Unhealthy eating behaviors can also cause you to be overweight or obese, which can  increase your risk of:  Heart disease.  High blood pressure.  Type 2 diabetes.  Some types of cancers. Using disordered eating or other unhealthy eating behaviors to try to lose weight can cause:  Fatigue.   Gastrointestinal problems.  Dehydration.  Imbalances in body fluids.  Low heart rate and blood pressure.  Thin bones that break easily.  Social isolation or relationship problems with your friends and family.  Emotional distress, including depression and anxiety.  A greater risk of an eating disorder. If you develop an eating disorder, you could develop serious health problems and complications that affect yourorgans and bodily processes. These include:  Dry skin and hair.  Hair loss.  Fainting.  Difficulty getting pregnant.  Changes in your heart muscle and the way your heart works.  Severe dehydration that can lead to kidney failure.  Long-term (chronic) gastrointestinal problems.  High blood pressure.  High cholesterol.  Heart disease.  Type 2 diabetes. Where to find support For more support, talk with:  Your health care provider or dietitian. Ask about support groups.  A mental health care provider.  Family and friends. Where to find more information Learn more about how to prevent complications from unhealthy eating behaviors from:  CashmereCloseouts.hu: FormerBoss.no  Centers for Disease Control and Prevention: MedicalLocators.com.cy.[place] of Mental Health: PureMess.co.nz.shtml  National Eating Disorders Association: www.nationaleatingdisorders.org Contact a health care provider if:  You often feel very tired.  You notice changes in your skin or your hair.  You faint because you have not eaten enough.  You struggle to change your unhealthy eating behaviors on your own.  Unhealthy eating behaviors are affecting your daily life.  You have signs or symptoms of an eating disorder.  You have major weight changes in a short period of time.  You feel guilty or ashamed about eating.  You have trouble with your relationships because of  your eating habits. Summary  Unhealthy eating behaviors could be eating too much or too little, eating unhealthy foods, or eating at the wrong times.  You can improve your eating habits and help prevent health problems by choosing healthy foods, getting enough calories every day, and exercising regularly.  If you cannot make these changes by yourself, or if you think that you may have an eating disorder, contact your health care provider. This information is not intended to replace advice given to you by your health care provider. Make sure you discuss any questions you have with your health care provider. Document Released: 01/22/2015 Document Revised: 12/20/2016 Document Reviewed: 01/22/2015 Elsevier Patient Education  2020 [employer].

## 2018-10-01 NOTE — Progress Notes (Signed)
Date:  10/01/2018   Name:  Melissa Chase   DOB:  [birth year]   MRN:  [id number]   Chief Complaint: Discuss Ct Finding  Depression        This is a chronic problem.  The current episode started more than 1 year ago.   The problem occurs daily.  The problem has been waxing and waning since onset.  Associated symptoms include irritable.  Associated symptoms include no decreased concentration, no fatigue, no helplessness, no hopelessness, does not have insomnia, no restlessness, no decreased interest, no appetite change, no body aches, no myalgias, no headaches, no indigestion, not sad and no suicidal ideas.     The symptoms are aggravated by family issues.  Past treatments include SSRIs - Selective serotonin reuptake inhibitors.  Compliance with treatment is good.  Previous treatment provided mild relief.  Past medical history includes eating disorder.     Review of Systems  Constitutional: Negative.  Negative for appetite change, chills, fatigue, fever and unexpected weight change.  HENT: Negative for congestion, ear discharge, ear pain, rhinorrhea, sinus pressure, sneezing and sore throat.   Eyes: Negative for photophobia, pain, discharge, redness and itching.  Respiratory: Negative for cough, shortness of breath, wheezing and stridor.   Cardiovascular: Negative for chest pain, palpitations and leg swelling.  Gastrointestinal: Negative for abdominal pain, blood in stool, constipation, diarrhea, nausea and vomiting.  Endocrine: Negative for cold intolerance, heat intolerance, polydipsia, polyphagia and polyuria.  Genitourinary: Negative for dysuria, flank pain, frequency, hematuria, menstrual problem, pelvic pain, urgency, vaginal bleeding and vaginal discharge.  Musculoskeletal: Negative for arthralgias, back pain and myalgias.  Skin: Negative for rash.  Allergic/Immunologic: Negative for environmental allergies and food allergies.  Neurological: Negative for dizziness, weakness,  light-headedness, numbness and headaches.  Hematological: Negative for adenopathy. Does not bruise/bleed easily.  Psychiatric/Behavioral: Positive for depression. Negative for decreased concentration, dysphoric mood and suicidal ideas. The patient is not nervous/anxious and does not have insomnia.     Patient Active Problem List   Diagnosis Date Noted  . 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 Goals of care, counseling/discussion 09/18/2017  . Sacroiliitis ([place]) 02/04/2017  . Dilated cardiomyopathy ([place]) 05/02/2016  . Bradycardia 04/04/2016  . A-fib ([place]) 03/20/2016  . Acute dyspnea 02/20/2016  . 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 Arthritis 06/06/2014  . Aggrieved 06/06/2014  . Dermatitis, eczematoid 06/06/2014  . Recurrent major depressive episodes ([place]) 06/06/2014  . Essential (primary) hypertension 06/06/2014  . Gastro-esophageal reflux disease without esophagitis 06/06/2014  . Cannot sleep 06/06/2014  . Menopause 06/06/2014    No Known Allergies  Past Surgical History:  Procedure Laterality Date  . BREAST LUMPECTOMY Right 07/09/2017  . BREAST LUMPECTOMY Left 2006  . BREAST LUMPECTOMY WITH NEEDLE LOCALIZATION Right 07/09/2017   Procedure: BREAST LUMPECTOMY WITH NEEDLE LOCALIZATION;  Surgeon: Herbert Pun, MD;  Location: [facility] ORS;  Service: General;  Laterality: Right;  . BREAST SURGERY     left breast lumpectomy  . CARDIOVERSION N/A 03/21/2016   Procedure: Cardioversion;  Surgeon: Corey Skains, MD;  Location: [facility] ORS;  Service: Cardiovascular;  Laterality: N/A;  . CATARACT EXTRACTION, BILATERAL    . LAPAROSCOPIC CHOLECYSTECTOMY    . TONSILLECTOMY    . VAGINAL HYSTERECTOMY      Social History   Tobacco Use  . Smoking status: Never Smoker  . Smokeless tobacco: Never Used  Substance Use Topics  . Alcohol use: Never  Frequency: Never  . Drug use: No      Medication list has been reviewed and updated.  Current Meds  Medication Sig  . alendronate (FOSAMAX) 70 MG tablet TAKE 1 TABLET BY MOUTH ONCE A WEEK. TAKE WITH A FULL GLASS OF WATER ON AN EMPTY STOMACH.  Marland Kitchen apixaban (ELIQUIS) 5 MG TABS tablet Take 5 mg by mouth 2 (two) times daily.  . calcium-vitamin D (OSCAL WITH D) 500-200 MG-UNIT TABS tablet Take 1 tablet by mouth 2 (two) times daily.   . hydrochlorothiazide (MICROZIDE) 12.5 MG capsule TAKE 1 CAPSULE BY MOUTH EVERY DAY  . Melatonin 10 MG TABS Take 20 mg by mouth at bedtime.   . metoprolol succinate (TOPROL-XL) 100 MG 24 hr tablet Take 1 tablet (100 mg total) by mouth daily. Take with or immediately following a meal.  . nystatin (MYCOSTATIN) 100000 UNIT/ML suspension Take 5 mLs by mouth 4 (four) times daily as needed (for mouth sores).  Marland Kitchen omeprazole (PRILOSEC) 40 MG capsule TAKE 1 CAPSULE BY MOUTH EVERY DAY  . pravastatin (PRAVACHOL) 20 MG tablet Take 1 tablet (20 mg total) by mouth daily.  . sertraline (ZOLOFT) 100 MG tablet Take 1 tablet (100 mg total) by mouth daily.  . sucralfate (CARAFATE) 1 g tablet Take 1 tablet (1 g total) by mouth 2 (two) times daily.  . tamoxifen (NOLVADEX) 20 MG tablet TAKE 1 TABLET BY MOUTH EVERY DAY    PHQ 2/9 Scores 10/01/2018 08/21/2018 07/16/2018 07/22/2017  PHQ - 2 Score 0 0 0 0  PHQ- 9 Score - 8 0 -    BP Readings from Last 3 Encounters:  10/01/18 136/72  09/18/18 123/76  09/03/18 119/75    Physical Exam Vitals signs and nursing note reviewed.  Constitutional:      General: She is irritable.     Appearance: She is well-developed.  HENT:     Head: Normocephalic.     Right Ear: Tympanic membrane, ear canal and external ear normal.     Left Ear: Tympanic membrane, ear canal and external ear normal.     Nose: Nose normal.     Mouth/Throat:     Mouth: Mucous membranes are moist.  Eyes:     General: Lids are everted, no foreign bodies appreciated. No scleral icterus.       Left eye: No foreign  body or hordeolum.     Conjunctiva/sclera: Conjunctivae normal.     Right eye: Right conjunctiva is not injected.     Left eye: Left conjunctiva is not injected.     Pupils: Pupils are equal, round, and reactive to light.  Neck:     Musculoskeletal: Normal range of motion and neck supple.     Thyroid: No thyromegaly.     Vascular: No carotid bruit or JVD.     Trachea: No tracheal deviation.  Cardiovascular:     Rate and Rhythm: Normal rate and regular rhythm.     Pulses: Normal pulses. No decreased pulses.     Heart sounds: Normal heart sounds, S1 normal and S2 normal. No murmur. No systolic murmur. No diastolic murmur. No friction rub. No gallop. No S3 or S4 sounds.   Pulmonary:     Effort: Pulmonary effort is normal. No respiratory distress.     Breath sounds: Normal breath sounds. No wheezing, rhonchi or rales.  Abdominal:     General: Bowel sounds are normal.     Palpations: Abdomen is soft. There is no hepatomegaly, splenomegaly or mass.  Tenderness: There is no abdominal tenderness. There is no right CVA tenderness, left CVA tenderness, guarding or rebound.  Musculoskeletal: Normal range of motion.        General: No tenderness.     Right lower leg: No edema.     Left lower leg: No edema.  Lymphadenopathy:     Cervical: No cervical adenopathy.  Skin:    General: Skin is warm.     Findings: No rash.  Neurological:     Mental Status: She is alert and oriented to person, place, and time.     Cranial Nerves: No cranial nerve deficit.     Deep Tendon Reflexes: Reflexes normal.  Psychiatric:        Mood and Affect: Mood is not anxious or depressed.     Wt Readings from Last 3 Encounters:  10/01/18 221 lb (100.2 kg)  09/18/18 223 lb 11.2 oz (101.5 kg)  09/03/18 226 lb 12.8 oz (102.9 kg)    BP 136/72   Pulse 63   Ht 5' 5.5" (1.664 m)   Wt 221 lb (100.2 kg)   SpO2 95%   BMI 36.22 kg/m   Assessment and Plan:  1. Weight loss, non-intentional Patient was brought  in today to discuss her CT scans at the thoracic chest abdominal and pelvic areas.  There were no  suspected areas for weight loss concern.  Fatigue work-up was reviewed with patient with the noting that there was no anemia, thyroid disorder, or metabolic concern.  Discussed about eating disorders.  Alternatives to diet that the patient may find more palatable were discussed.  2. Recurrent major depressive episodes ([place]) Patient is currently on 100 mg sertraline and has discussed the possibility that we may need to consider involving a behavioral modification and or change in medication but they would be determined by his outpatient psychiatry.  Patient is reluctant to do this but on explanation she seems to understand that we may need some assistance of this nature in the future.  Patient will be returning in 8 weeks and we will further evaluate if there is further weight loss and/or no improvement in her underlying depression.  3. Thoracic aortic aneurysm without rupture ([place]) this is new onset. This is new onset as noted on recent evaluation for weight loss incidentally patient was noted to have a thoracic aneurysm at 4 cm.  This will be watched until either there is a change in increase or it reaches 6 cm or more.  Patient has an understanding of this and will continue to return for annual thoracic CTs or o MRI.  I spent 30 minutes with this patient, More than 50% of that time was spent in face to face education, counseling and care coordination.

## 2018-10-20 ENCOUNTER — Other Ambulatory Visit: Payer: Self-pay

## 2018-10-20 ENCOUNTER — Ambulatory Visit
Admission: RE | Admit: 2018-10-20 | Discharge: 2018-10-20 | Disposition: A | Discharge status: Home / Self Care | Payer: Medicare Other | Attending: Family Medicine | Admitting: Family Medicine

## 2018-10-20 ENCOUNTER — Ambulatory Visit
Admission: RE | Admit: 2018-10-20 | Discharge: 2018-10-20 | Disposition: A | Discharge status: Home / Self Care | Payer: Medicare Other | Source: Ambulatory Visit | Attending: Family Medicine | Admitting: Family Medicine

## 2018-10-20 ENCOUNTER — Other Ambulatory Visit: Payer: Medicare Other

## 2018-10-20 DIAGNOSIS — M545 Low back pain, unspecified: Secondary | ICD-10-CM

## 2018-10-20 DIAGNOSIS — M533 Sacrococcygeal disorders, not elsewhere classified: Secondary | ICD-10-CM

## 2018-10-20 NOTE — Progress Notes (Signed)
Xray for ls spine put in

## 2018-10-22 ENCOUNTER — Encounter: Payer: Self-pay | Admitting: Family Medicine

## 2018-10-22 ENCOUNTER — Ambulatory Visit (INDEPENDENT_AMBULATORY_CARE_PROVIDER_SITE_OTHER): Payer: Medicare Other | Admitting: Family Medicine

## 2018-10-22 ENCOUNTER — Other Ambulatory Visit: Payer: Self-pay

## 2018-10-22 VITALS — BP 122/72 | HR 72 | Ht 65.5 in | Wt 225.0 lb

## 2018-10-22 DIAGNOSIS — M5116 Intervertebral disc disorders with radiculopathy, lumbar region: Secondary | ICD-10-CM | POA: Diagnosis not present

## 2018-10-22 DIAGNOSIS — M533 Sacrococcygeal disorders, not elsewhere classified: Secondary | ICD-10-CM

## 2018-10-22 DIAGNOSIS — Z23 Encounter for immunization: Secondary | ICD-10-CM | POA: Diagnosis not present

## 2018-10-22 MED ORDER — TRAMADOL HCL 50 MG PO TABS: 50.0000 mg | ORAL_TABLET | Freq: Three times a day (TID) | ORAL | 0 refills | Status: AC | PRN

## 2018-10-22 NOTE — Patient Instructions (Signed)
Tailbone Injury The tailbone is the small bone at the lower end of the backbone (spine). The tailbone can become injured from:  A fall.  Sitting to row or bike for a long time.  Having a baby. This type of injury can be painful. Most tailbone injuries get better on their own in 4-6 weeks. Follow these instructions at home: Activity  Avoid sitting in one place for a long time.  Wear proper pads and gear when riding a bike or rowing.  Increase your activity as the pain allows.  Do exercises as told by your doctor or physical therapist. Managing pain, stiffness, and swelling  To lessen pain: ? Sit on a large, rubber or inflated ring or cushion. ? Lean forward when you sit.  If told, apply ice to the injured area. ? Put ice in a plastic bag. ? Place a towel between your skin and the bag. ? Leave the ice on for 20 minutes, 2-3 times per day. Do this for the first 1-2 days.  If told, put heat on the injured area. Do this as often as told by your doctor. Use the heat source that your doctor recommends, such as a moist heat pack or a heating pad. ? Place a towel between your skin and the heat source. ? Leave the heat on for 20-30 minutes. ? Remove the heat if your skin turns bright red. This is very important if you are unable to feel pain, heat, or cold. You may have a greater risk of getting burned. General instructions  Take over-the-counter and prescription medicines only as told by your doctor.  To prevent or treat trouble pooping (constipation) or pain when pooping, your doctor may suggest that you: ? Drink enough fluid to keep your pee (urine) pale yellow. ? Eat foods that are high in fiber. These include fresh fruits and vegetables, whole grains, and beans. ? Limit foods that are high in fat and sugar. These include fried and sweet foods. ? Take an over-the-counter or prescription medicine to treat trouble pooping.  Keep all follow-up visits as told by your doctor. This is  important. Contact a doctor if:  Your pain gets worse.  Pooping causes you pain.  You cannot poop after 4 days.  You have pain during sex. Summary  A tailbone injury can happen from a fall, from sitting for a long time to row or bike, or after having a baby.  These injuries can be painful. Most tailbone injuries get better on their own in 4-6 weeks.  Sit on a large, rubber or inflated ring or cushion to lessen pain.  Avoid sitting in one place for a long time.  Follow your doctor's suggestions to prevent or treat trouble pooping. This information is not intended to replace advice given to you by your health care provider. Make sure you discuss any questions you have with your health care provider. Document Released: 02/09/2010 Document Revised: 02/04/2017 Document Reviewed: 02/04/2017 Elsevier Patient Education  2020 [place]. Herniated Disk A herniated disk is when a disk in your spine bulges out too far. There is a disk with a spongy center in between each pair of bones in the spine (vertebrae). These disks act as shock absorbers when you move. A herniated disk can cause pain and muscle weakness. This can happen anywhere in the back or neck. It happens most often in the lower back. What are the causes? This condition may be caused by:  Wear and tear as you age.  Sudden injury, such as a strain or sprain. What increases the risk? The following factors may make you more likely to develop this condition:  Aging. This is the main thing that increases your risk.  Being a man who is 41-60 years old.  Frequently doing activities that involve heavy lifting, bending, or twisting.  Frequently driving for long hours at a time.  Not getting enough exercise.  Being overweight.  Using tobacco products. Other things that increase risk include:  Having a family history of back problems or herniated disks.  Being pregnant or giving birth.  Not getting enough vitamins and  minerals (having poor nutrition).  Being tall. What are the signs or symptoms? Symptoms may vary depending on where your herniated disk is in your body.  A herniated disk in the lower back may cause sharp pain in: ? Part of the arm, leg, hip, or butt. ? The back of the lower leg (calf). ? The lower back, spreading down through the leg into the foot (sciatica).  A herniated disk in the neck may cause dizziness and a feeling that things around you are moving when they are not (vertigo). It may also cause pain or weakness in: ? The neck. ? The shoulder blades. ? The upper arm, lower arm, or fingers. You may also have muscle weakness. It may be hard to:  Lift your leg or arm.  Stand on your toes.  Squeeze tightly with one of your hands. Other symptoms may include:  Loss of feeling (numbness) or tingling in the affected areas of the hands, arms, feet, or legs.  Being unable to control when you pee, or urinate, or when you poop, or have bowel movements. This is rare but serious. You may have a very bad herniated disk in the lower back. How is this diagnosed? This condition may be diagnosed by:  Your symptoms.  Your medical history.  A physical exam. The exam may include: ? A straight-leg test. For this test, you will lie on your back while your doctor lifts your leg, keeping your knee straight. If you feel pain, you likely have a herniated disk. ? Tests of your brain and nervous system. These include checking for numbness, reflexes, muscle strength, and problems with posture.  Tests that create pictures (images), such as: ? X-rays. ? MRI. ? CT scan. ? Electromyogram (EMG). This test may be used to find out which nerves are affected by your herniated disk. How is this treated? This condition may be treated with:  A short period of rest. This is usually the first treatment. ? You may be on bed rest for up to 2 days, or you may be told to stay home and avoid physical activity.  ? If you have a herniated disk in your lower back, avoid sitting as much as possible. Sitting increases pressure on the disk.  Medicines. These may include: ? NSAIDs, such as ibuprofen, to help reduce pain and swelling. ? Muscle relaxants to prevent sudden tightening of the back muscles (back spasms). ? Prescription pain medicines, if you have very bad pain.  Ice or heat therapy.  Steroid shots (injections) in the area of the herniated disk. These can help reduce pain and swelling.  Physical therapy to strengthen your back muscles. In many cases, symptoms go away with treatment over a period of days or weeks. You will most likely be free of symptoms after 3-4 months. If other treatments do not help, you may need surgery. Follow these instructions  at home: Medicines  Take over-the-counter and prescription medicines only as told by your doctor.  Ask your doctor if the medicine prescribed to you: ? Requires you to avoid driving or using heavy machinery. ? Can cause trouble pooping (constipation). You may need to take these actions to prevent or treat trouble pooping:  Drink enough fluid to keep your pee (urine) pale yellow.  Take over-the-counter or prescription medicines.  Eat foods that are high in fiber. These include beans, whole grains, and fresh fruits and vegetables.  Limit foods that are high in fat and processed sugars. These include fried or sweet foods. Activity  Rest as told by your doctor.  After your rest period: ? Return to your normal activities as told by your doctor. Ask your doctor what activities are safe for you. ? Use good posture. ? Avoid movements that cause pain. ? Do not lift anything that is heavier than 10 lb (4.5 kg), or the limit that you are told, until your doctor says that it is safe. ? Do not sit or stand for a long time without moving. ? Do not sit for a long time without getting up and moving around.  Do exercises (physical therapy) as told.   Try to strengthen your back and belly (abdomen) with exercises like crunches, swimming, or walking. General instructions  Do not use any products that contain nicotine or tobacco, such as cigarettes, e-cigarettes, and chewing tobacco. If you need help quitting, ask your doctor.  Do not wear high-heeled shoes.  Do not sleep on your belly.  If you are overweight, work with your doctor to lose weight safely.  Keep all follow-up visits as told by your doctor. This is important. How is this prevented?   Stay at a healthy weight.  Stay in shape. Do at least 150 minutes of moderate-intensity exercise each week, such as fast walking or water aerobics.  When lifting objects: ? Keep your feet as far apart as your shoulders or farther apart. ? Tighten your belly muscles. ? Bend your knees and hips and keep your spine neutral. Lift using the strength of your legs, not your back. Do not lock your knees straight out. ? Always ask for help to lift heavy or awkward objects. Contact a doctor if:  You have back pain or neck pain that does not get better after 6 weeks.  You have very bad pain.  You get any of these problems in any part of your body: ? Tingling. ? Weakness. ? Numbness. Get help right away if:  You cannot move your arms or legs.  You cannot control when you pee (urinate) or poop (have a bowel movement).  You feel dizzy.  You faint.  You have trouble breathing. Summary  A herniated disk is when a disk in your spine bulges out too far.  This condition may be caused by wear and tear as you age or sudden injury.  Symptoms may vary depending on where your herniated disk is in your body.  Treatment includes rest, medicines, ice or heat therapy, steroid shots, and exercises.  You may need surgery if other treatments do not help. This information is not intended to replace advice given to you by your health care provider. Make sure you discuss any questions you have  with your health care provider. Document Released: 05/24/2013 Document Revised: 04/30/2018 Document Reviewed: 07/06/2015 Elsevier Patient Education  2020 [employer].

## 2018-10-22 NOTE — Progress Notes (Signed)
Date:  10/22/2018   Name:  Melissa Chase   DOB:  1940-10-15   MRN:  [id number]   Chief Complaint: Tailbone Pain (tailbone pain s/p 2 weeks ago. Is hurting in middle of buttocks and hurts to sit and gets numb when laying down. Has been using a heating pad and pillows to prop up on.) and pneum 13  Back Pain This is a new problem. The current episode started 1 to 4 weeks ago. The problem occurs constantly. The problem is unchanged. The pain is present in the lumbar spine and gluteal. The quality of the pain is described as aching and shooting. Radiates to: left buttock. The pain is at a severity of 9/10. The pain is severe. The pain is the same all the time. The symptoms are aggravated by bending, twisting and sitting (no position of comfort). Associated symptoms include numbness and tingling. Pertinent negatives include no abdominal pain, bladder incontinence, bowel incontinence, dysuria, fever, headaches, paresis, paresthesias, pelvic pain, perianal numbness, weakness or weight loss. Risk factors include recent trauma. She has tried NSAIDs for the symptoms. The treatment provided no relief.  Fall The accident occurred more than 1 week ago. Fall occurred: coming down steps /Bumped down 5 steps. The point of impact was the buttocks. The pain is present in the buttocks and back (coccyx). The pain is at a severity of 9/10. The pain is severe. Associated symptoms include numbness and tingling. Pertinent negatives include no abdominal pain, bowel incontinence, fever, headaches, hematuria, nausea or vomiting. She has tried NSAID and acetaminophen for the symptoms.    Review of Systems  Constitutional: Negative.  Negative for chills, fatigue, fever, unexpected weight change and weight loss.  HENT: Negative for congestion, ear discharge, ear pain, rhinorrhea, sinus pressure, sneezing and sore throat.   Eyes: Negative for photophobia, pain, discharge, redness and itching.  Respiratory: Negative for  cough, shortness of breath, wheezing and stridor.   Gastrointestinal: Negative for abdominal pain, blood in stool, bowel incontinence, constipation, diarrhea, nausea and vomiting.  Endocrine: Negative for cold intolerance, heat intolerance, polydipsia, polyphagia and polyuria.  Genitourinary: Negative for bladder incontinence, dysuria, flank pain, frequency, hematuria, menstrual problem, pelvic pain, urgency, vaginal bleeding and vaginal discharge.  Musculoskeletal: Positive for back pain. Negative for arthralgias and myalgias.  Skin: Negative for rash.  Allergic/Immunologic: Negative for environmental allergies and food allergies.  Neurological: Positive for tingling and numbness. Negative for dizziness, weakness, light-headedness, headaches and paresthesias.  Hematological: Negative for adenopathy. Does not bruise/bleed easily.  Psychiatric/Behavioral: Negative for dysphoric mood. The patient is not nervous/anxious.     Patient Active Problem List   Diagnosis Date Noted  . 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 Goals of care, counseling/discussion 09/18/2017  . Sacroiliitis ([place]) 02/04/2017  . Dilated cardiomyopathy ([place]) 05/02/2016  . Bradycardia 04/04/2016  . A-fib ([place]) 03/20/2016  . Acute dyspnea 02/20/2016  . 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 Arthritis 06/06/2014  . Aggrieved 06/06/2014  . Dermatitis, eczematoid 06/06/2014  . Recurrent major depressive episodes ([place]) 06/06/2014  . Essential (primary) hypertension 06/06/2014  . Gastro-esophageal reflux disease without esophagitis 06/06/2014  . Cannot sleep 06/06/2014  . Menopause 06/06/2014    No Known Allergies  Past Surgical History:  Procedure Laterality Date  . BREAST LUMPECTOMY Right 07/09/2017  . BREAST LUMPECTOMY Left 2006  . BREAST LUMPECTOMY WITH NEEDLE LOCALIZATION Right 07/09/2017   Procedure: BREAST LUMPECTOMY WITH  NEEDLE LOCALIZATION;  Surgeon: Windell Moment,  Reeves Forth, MD;  Location: [facility] ORS;  Service: General;  Laterality: Right;  . BREAST SURGERY     left breast lumpectomy  . CARDIOVERSION N/A 03/21/2016   Procedure: Cardioversion;  Surgeon: Corey Skains, MD;  Location: [facility] OR[facility];  Service: Cardiovascular;  Laterality: N/A;  . CATARACT EXTRACTION, BILATERAL    . LAPAROSCOPIC CHOLECYSTECTOMY    . TONSILLECTOMY    . VAGINAL HYSTERECTOMY      Social History   Tobacco Use  . Smoking status: Never Smoker  . Smokeless tobacco: Never Used  Substance Use Topics  . Alcohol use: Never    Frequency: Never  . Drug use: No     Medication list has been reviewed and updated.  Current Meds  Medication Sig  . alendronate (FOSAMAX) 70 MG tablet TAKE 1 TABLET BY MOUTH ONCE A WEEK. TAKE WITH A FULL GLASS OF WATER ON AN EMPTY STOMACH.  Marland Kitchen apixaban (ELIQUIS) 5 MG TABS tablet Take 5 mg by mouth 2 (two) times daily.  . calcium-vitamin D (OSCAL WITH D) 500-200 MG-UNIT TABS tablet Take 1 tablet by mouth 2 (two) times daily.   . hydrochlorothiazide (MICROZIDE) 12.5 MG capsule TAKE 1 CAPSULE BY MOUTH EVERY DAY  . Melatonin 10 MG TABS Take 20 mg by mouth at bedtime.   . metoprolol succinate (TOPROL-XL) 100 MG 24 hr tablet Take 1 tablet (100 mg total) by mouth daily. Take with or immediately following a meal.  . omeprazole (PRILOSEC) 40 MG capsule TAKE 1 CAPSULE BY MOUTH EVERY DAY  . pravastatin (PRAVACHOL) 20 MG tablet Take 1 tablet (20 mg total) by mouth daily.  . sertraline (ZOLOFT) 100 MG tablet Take 1 tablet (100 mg total) by mouth daily.  . sucralfate (CARAFATE) 1 g tablet Take 1 tablet (1 g total) by mouth 2 (two) times daily.  . tamoxifen (NOLVADEX) 20 MG tablet TAKE 1 TABLET BY MOUTH EVERY DAY    PHQ 2/9 Scores 10/01/2018 08/21/2018 07/16/2018 07/22/2017  PHQ - 2 Score 0 0 0 0  PHQ- 9 Score - 8 0 -    BP Readings from Last 3 Encounters:  10/22/18 122/72  10/01/18 136/72  09/18/18 123/76    Physical  Exam Vitals signs and nursing note reviewed.  Constitutional:      Appearance: She is well-developed.  HENT:     Head: Normocephalic.     Right Ear: Tympanic membrane, ear canal and external ear normal.     Left Ear: Tympanic membrane, ear canal and external ear normal.     Nose: Nose normal.  Eyes:     General: Lids are everted, no foreign bodies appreciated. No scleral icterus.       Left eye: No foreign body or hordeolum.     Conjunctiva/sclera: Conjunctivae normal.     Right eye: Right conjunctiva is not injected.     Left eye: Left conjunctiva is not injected.     Pupils: Pupils are equal, round, and reactive to light.  Neck:     Musculoskeletal: Normal range of motion and neck supple.     Thyroid: No thyromegaly.     Vascular: No JVD.     Trachea: No tracheal deviation.  Cardiovascular:     Rate and Rhythm: Normal rate and regular rhythm.     Heart sounds: Normal heart sounds. No murmur. No friction rub. No gallop.   Pulmonary:     Effort: Pulmonary effort is normal. No respiratory distress.     Breath sounds: Normal breath sounds. No  wheezing, rhonchi or rales.  Abdominal:     General: Bowel sounds are normal.     Palpations: Abdomen is soft. There is no mass.     Tenderness: There is no abdominal tenderness. There is no right CVA tenderness, left CVA tenderness, guarding or rebound.  Musculoskeletal:     Lumbar back: She exhibits decreased range of motion, tenderness and spasm.     Comments: Positive SLR left  Lymphadenopathy:     Cervical: No cervical adenopathy.  Skin:    General: Skin is warm.     Findings: No rash.  Neurological:     Mental Status: She is alert and oriented to person, place, and time.     Cranial Nerves: No cranial nerve deficit.     Deep Tendon Reflexes: Reflexes normal.  Psychiatric:        Mood and Affect: Mood is not anxious or depressed.     Wt Readings from Last 3 Encounters:  10/22/18 225 lb (102.1 kg)  10/01/18 221 lb (100.2 kg)   09/18/18 223 lb 11.2 oz (101.5 kg)    BP 122/72   Pulse 72   Ht 5' 5.5" (1.664 m)   Wt 225 lb (102.1 kg)   BMI 36.87 kg/m   Assessment and Plan: 1. Lumbar disc herniation with radiculopathy Status post fall patient sat and bumped down the stairs of about 5 approximately 2 to 3 weeks ago.  Patient on evaluation and radiologic confirmation has a narrowed disc space along with degenerative changes as well.  Will refer to orthopedics in the meantime we will add tramadol 50 mg 1 every 6 to 8 hours.  To her analgesic regimen. - Ambulatory referral to Orthopedic Surgery  2. Traumatic coccydynia Patient is having some coccydynia discomfort with tenderness we will treat with tramadol 50 mg until evaluation with orthopedics to see if there is any other further evaluation we can include. - Ambulatory referral to Orthopedic Surgery   3. Need for pneumococcal vaccination Discussed and administered. - Pneumococcal conjugate vaccine 13-valent

## 2018-10-26 DIAGNOSIS — E669 Obesity, unspecified: Secondary | ICD-10-CM | POA: Diagnosis not present

## 2018-10-26 DIAGNOSIS — W108XXA Fall (on) (from) other stairs and steps, initial encounter: Secondary | ICD-10-CM | POA: Diagnosis not present

## 2018-10-26 DIAGNOSIS — S338XXA Sprain of other parts of lumbar spine and pelvis, initial encounter: Secondary | ICD-10-CM | POA: Diagnosis not present

## 2018-11-09 ENCOUNTER — Ambulatory Visit: Payer: Medicare Other | Admitting: Oncology

## 2018-11-13 IMAGING — MG MM BREAST SURGICAL SPECIMEN
1 series · 1 of 1 positions shown · non-contrast
Comparison: Previous exam(s).

CLINICAL DATA: Post right breast lumpectomy

EXAM:
SPECIMEN RADIOGRAPH OF THE RIGHT BREAST

[R SPECIMEN]
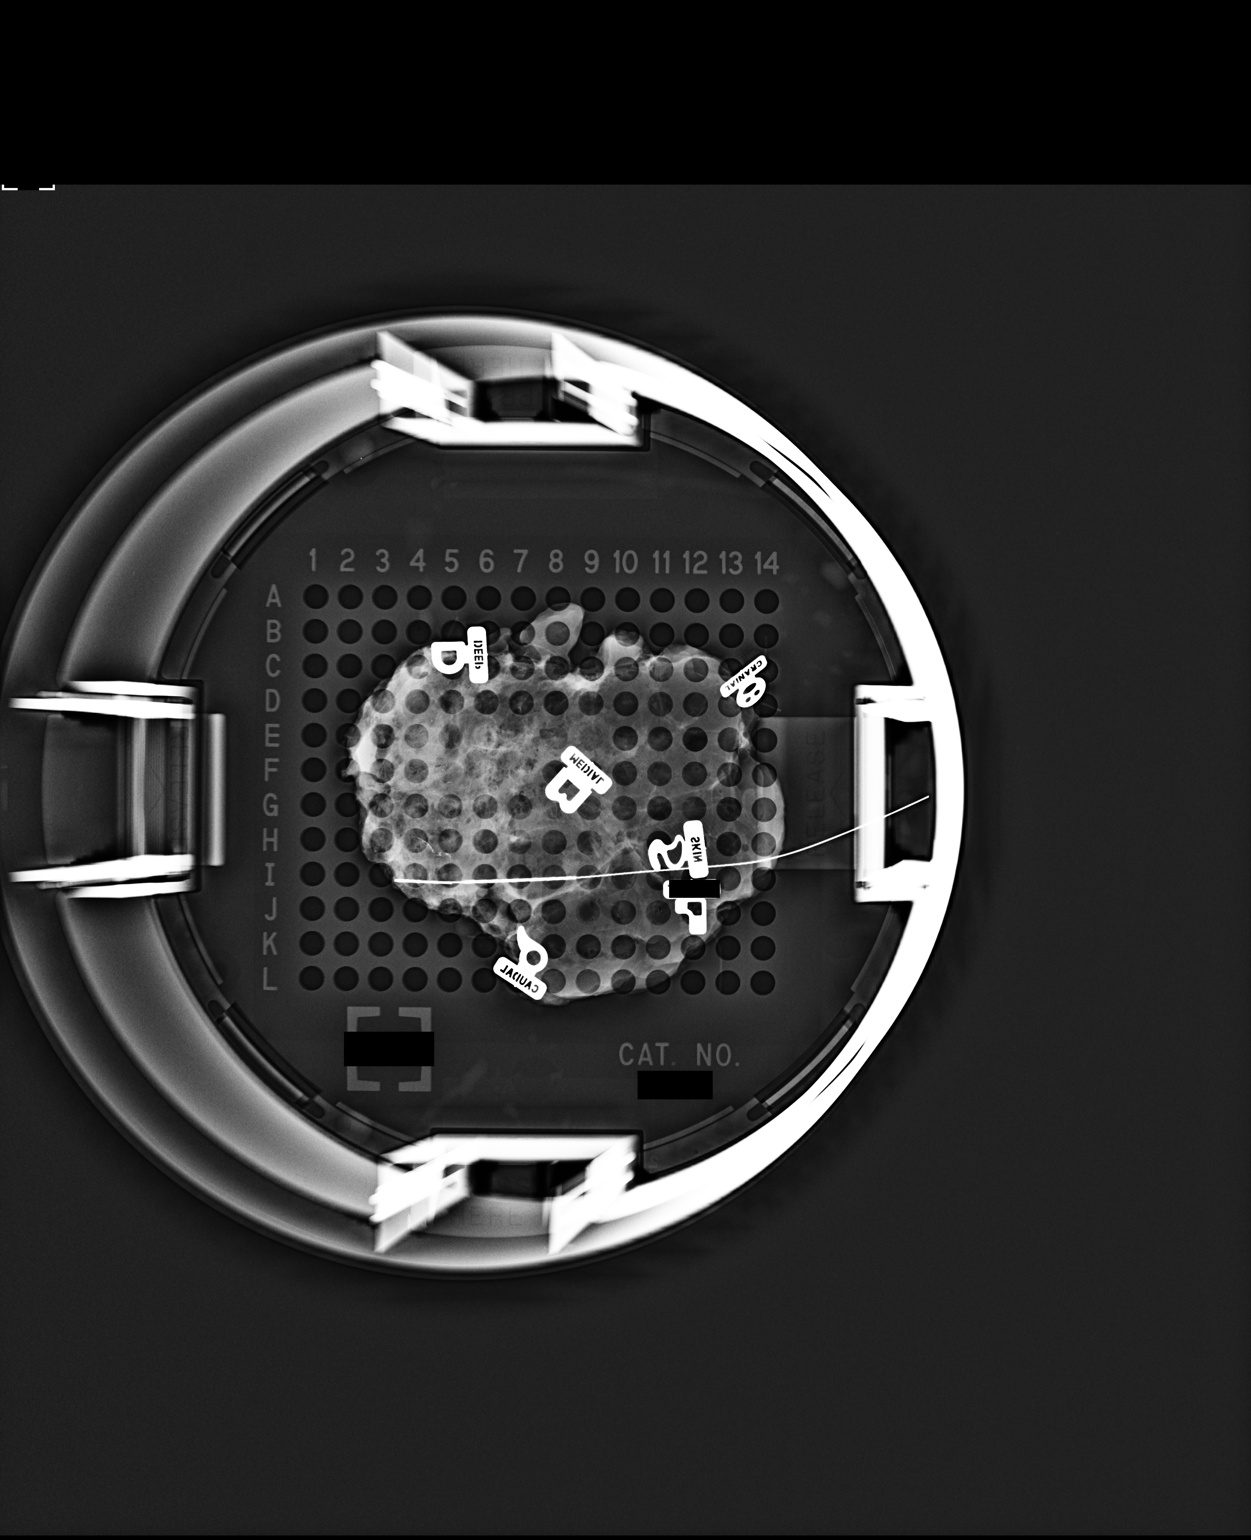

[1 of 1 positions shown; findings below may reference images not displayed]

FINDINGS: Status post excision of the right breast. The wire tip is present in
the specimen. The post biopsy marker is not present, however per Dr.
Filipe Tiago it was resected and found outside of the specimen.
IMPRESSION: Specimen radiograph of the right breast.

## 2018-11-13 IMAGING — MG MM DIGITAL DIAGNOSTIC UNILAT*R*
3 series · 3 of 3 positions shown · non-contrast
Comparison: Previous exam(s).

CLINICAL DATA: Post preoperative wire localization for atypical
papillary lesion of right breast 12:30 o'clock.

EXAM:
DIAGNOSTIC RIGHT MAMMOGRAM POST ULTRASOUND GUIDED WIRE LOCALIZATION.

[R ML]
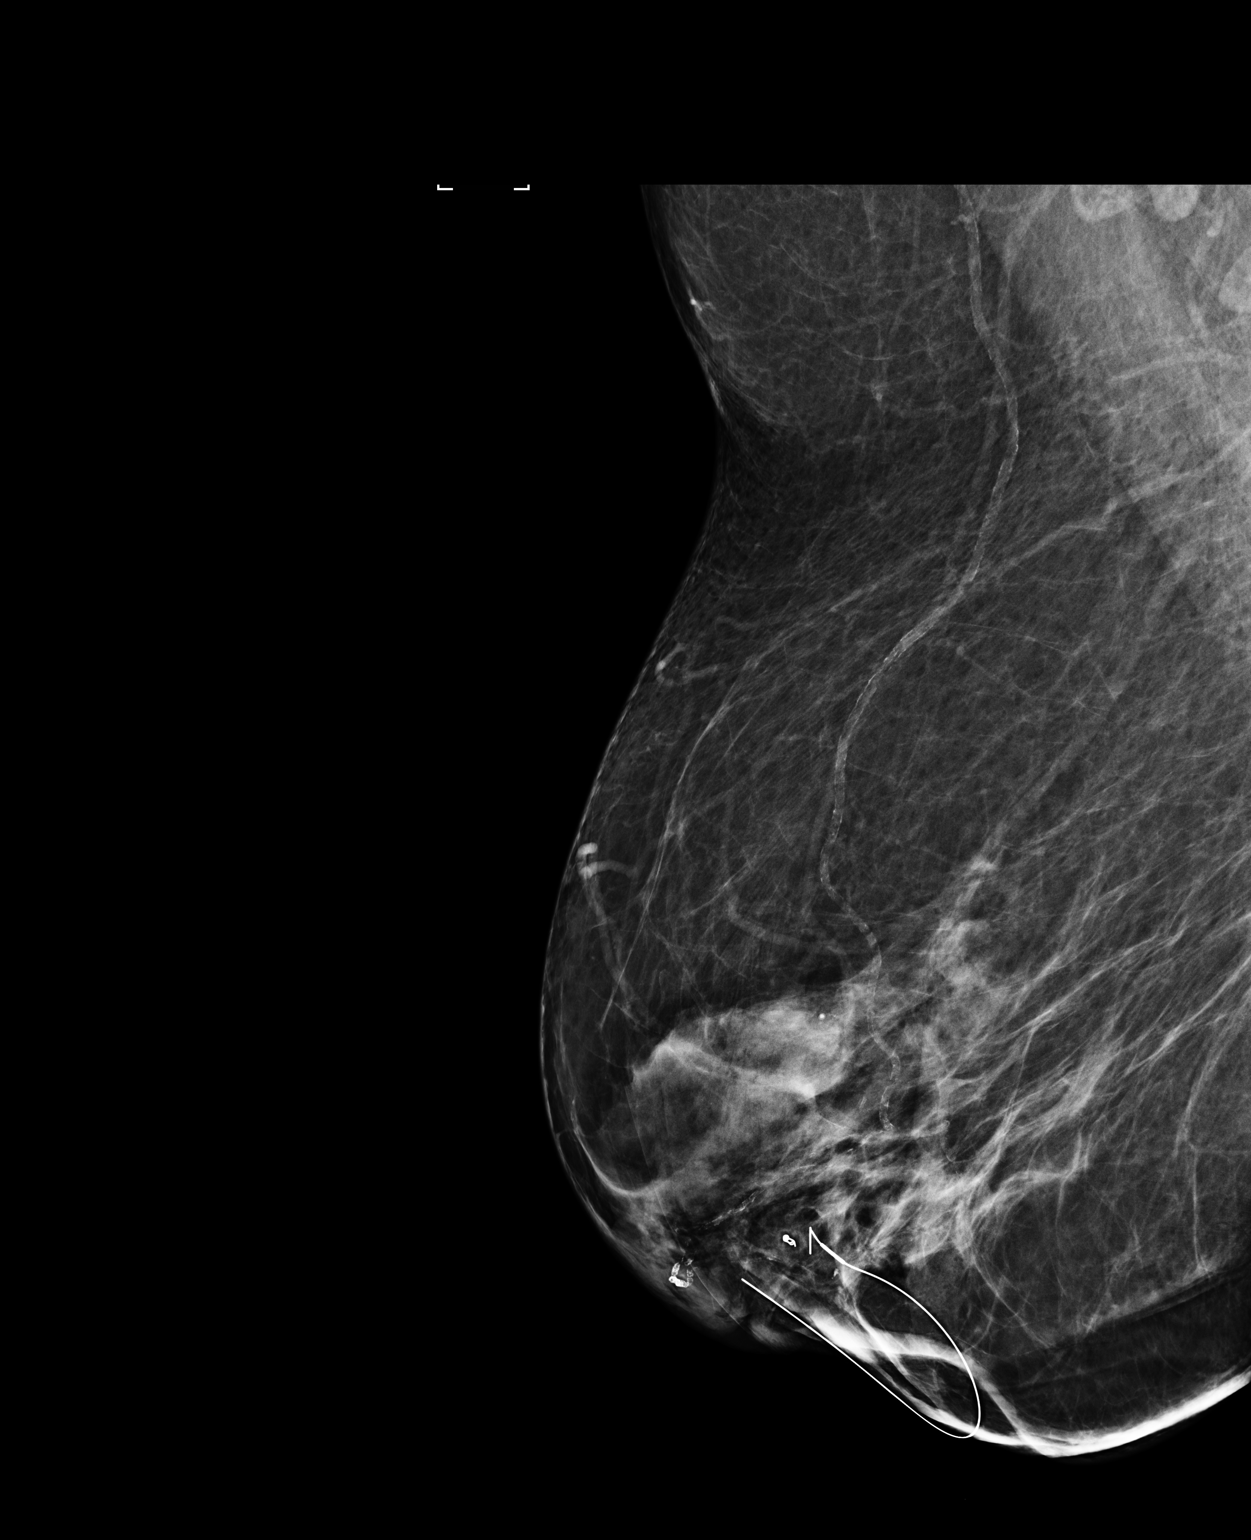

[R CC (1 of 2)]
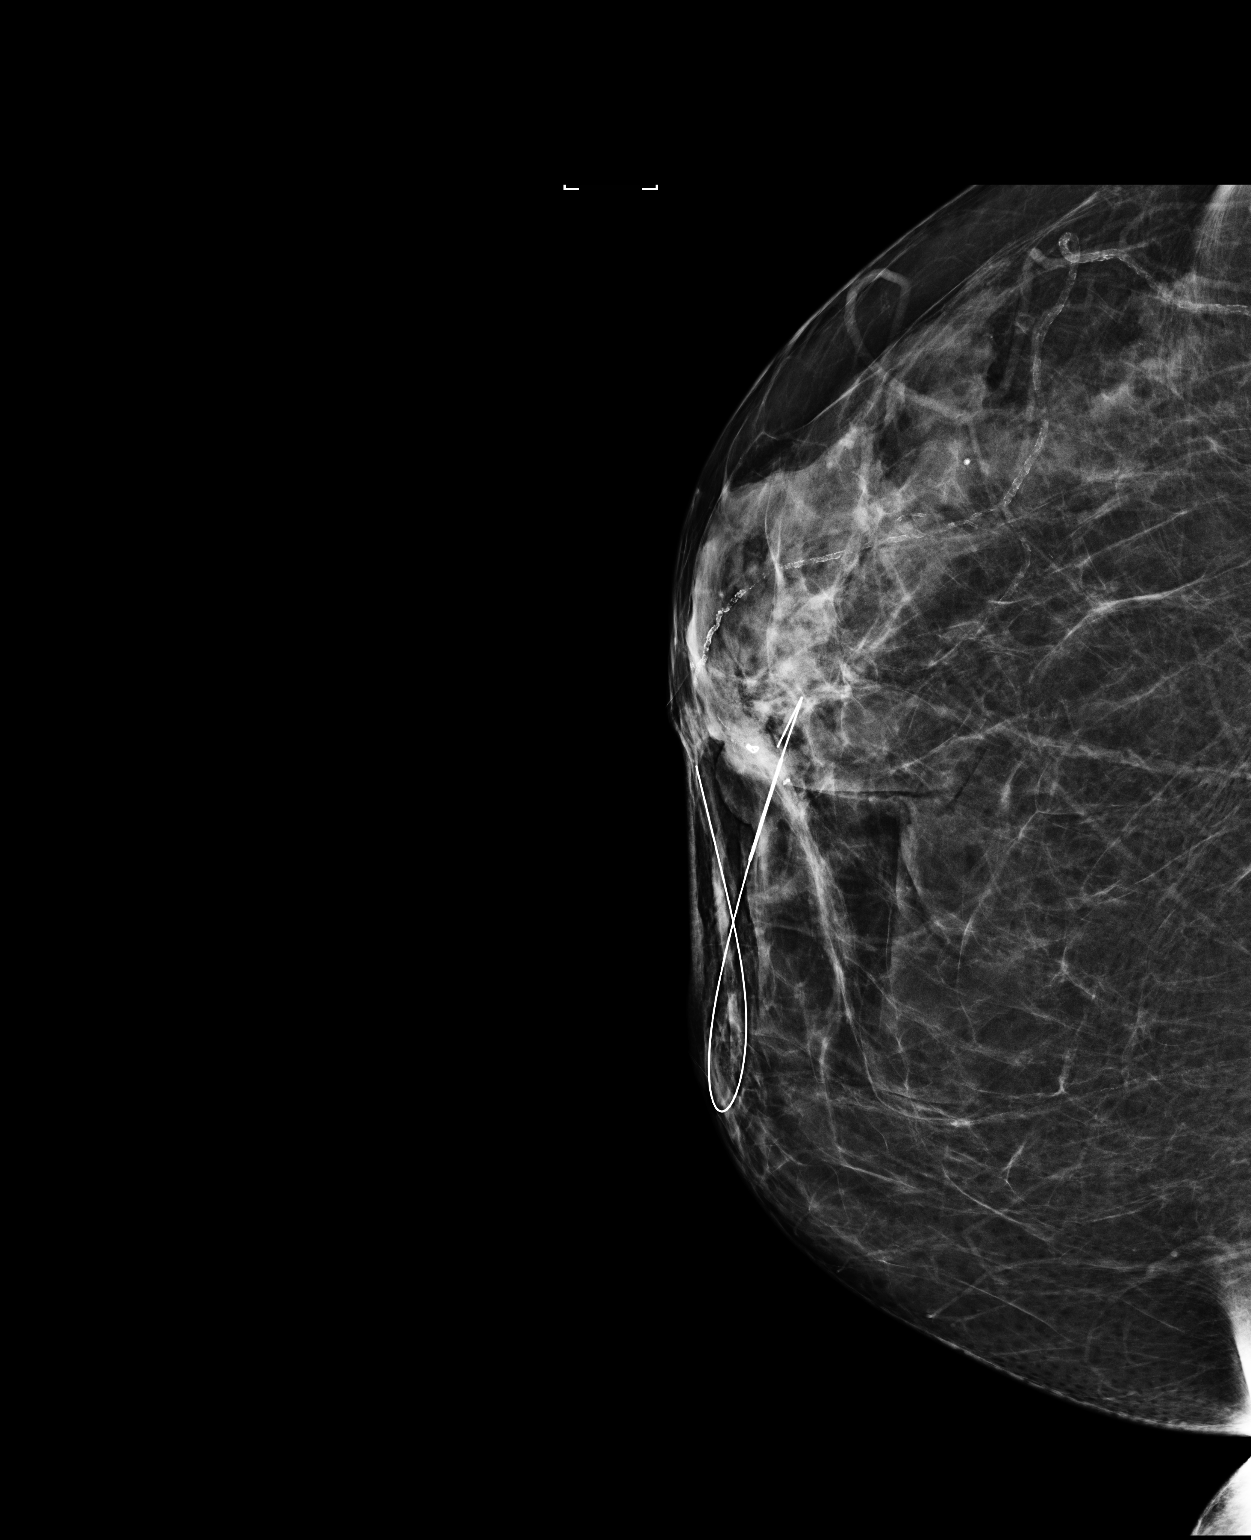

[R CC (2 of 2)]
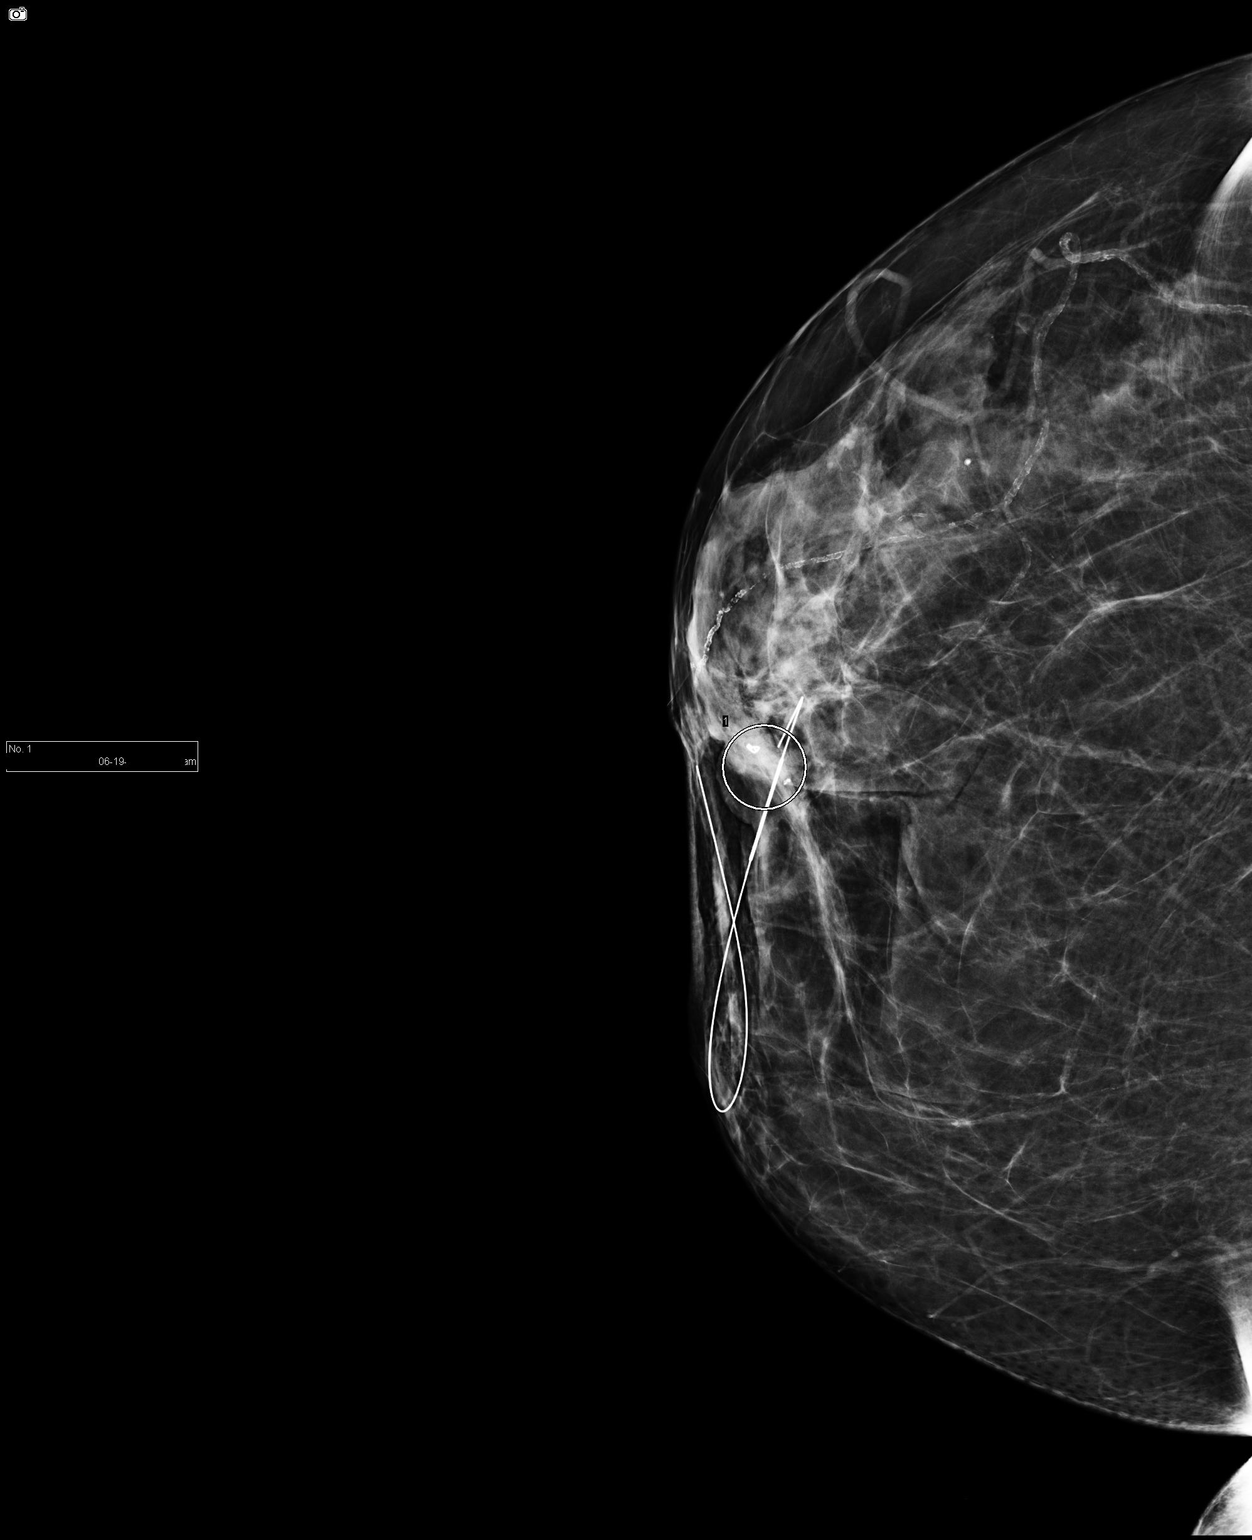

[3 of 3 positions shown; findings below may reference images not displayed]

FINDINGS: Mammographic images were obtained following ultrasound guided wire
localization of right breast 12:30 o'clock nodule. Mammographically,
the nodule of question is located anterior to the distal reinforced
portion of the wire.
IMPRESSION: Successful placement of preoperative localizing wire, prior to right
breast lumpectomy.

Final Assessment: Post Procedure Mammograms for Marker Placement

## 2018-11-16 ENCOUNTER — Ambulatory Visit: Payer: Medicare Other

## 2018-11-18 ENCOUNTER — Ambulatory Visit (INDEPENDENT_AMBULATORY_CARE_PROVIDER_SITE_OTHER): Payer: Medicare Other

## 2018-11-18 VITALS — BP 122/72 | Ht 66.0 in | Wt 223.0 lb

## 2018-11-18 DIAGNOSIS — Z0000 Encounter for general adult medical examination without abnormal findings: Secondary | ICD-10-CM

## 2018-11-18 NOTE — Patient Instructions (Signed)
Melissa Chase , Thank you for taking time to come for your Medicare Wellness Visit. I appreciate your ongoing commitment to your health goals. Please review the following plan we discussed and let me know if I can assist you in the future.   Screening recommendations/referrals: Colonoscopy: done 07/30/16 Mammogram: done 06/30/18 Bone Density: done 08/04/17 Recommended yearly ophthalmology/optometry visit for glaucoma screening and checkup Recommended yearly dental visit for hygiene and checkup  Vaccinations: Influenza vaccine: postponed Pneumococcal vaccine: done 10/22/18 Tdap vaccine: due  Shingles vaccine: Shingrix discussed. Please contact your pharmacy for coverage information.   Advanced directives: Please bring a copy of your health care power of attorney and living will to the office at your convenience.  Conditions/risks identified: Recommend drinking 6-8 glasses of water per day  Next appointment: Please follow up in one year for your Medicare Annual Wellness visit.     Preventive Care 30 Years and Older, Female Preventive care refers to lifestyle choices and visits with your health care provider that can promote health and wellness. What does preventive care include?  A yearly physical exam. This is also called an annual well check.  Dental exams once or twice a year.  Routine eye exams. Ask your health care provider how often you should have your eyes checked.  Personal lifestyle choices, including:  Daily care of your teeth and gums.  Regular physical activity.  Eating a healthy diet.  Avoiding tobacco and drug use.  Limiting alcohol use.  Practicing safe sex.  Taking low-dose aspirin every day.  Taking vitamin and mineral supplements as recommended by your health care provider. What happens during an annual well check? The services and screenings done by your health care provider during your annual well check will depend on your age, overall health, lifestyle  risk factors, and family history of disease. Counseling  Your health care provider may ask you questions about your:  Alcohol use.  Tobacco use.  Drug use.  Emotional well-being.  Home and relationship well-being.  Sexual activity.  Eating habits.  History of falls.  Memory and ability to understand (cognition).  Work and work Statistician.  Reproductive health. Screening  You may have the following tests or measurements:  Height, weight, and BMI.  Blood pressure.  Lipid and cholesterol levels. These may be checked every 5 years, or more frequently if you are over 62 years old.  Skin check.  Lung cancer screening. You may have this screening every year starting at age 57 if you have a 30-pack-year history of smoking and currently smoke or have quit within the past 15 years.  Fecal occult blood test (FOBT) of the stool. You may have this test every year starting at age 76.  Flexible sigmoidoscopy or colonoscopy. You may have a sigmoidoscopy every 5 years or a colonoscopy every 10 years starting at age 72.  Hepatitis C blood test.  Hepatitis B blood test.  Sexually transmitted disease (STD) testing.  Diabetes screening. This is done by checking your blood sugar (glucose) after you have not eaten for a while (fasting). You may have this done every 1-3 years.  Bone density scan. This is done to screen for osteoporosis. You may have this done starting at age 71.  Mammogram. This may be done every 1-2 years. Talk to your health care provider about how often you should have regular mammograms. Talk with your health care provider about your test results, treatment options, and if necessary, the need for more tests. Vaccines  Your health  care provider may recommend certain vaccines, such as:  Influenza vaccine. This is recommended every year.  Tetanus, diphtheria, and acellular pertussis (Tdap, Td) vaccine. You may need a Td booster every 10 years.  Zoster vaccine.  You may need this after age 89.  Pneumococcal 13-valent conjugate (PCV13) vaccine. One dose is recommended after age 45.  Pneumococcal polysaccharide (PPSV23) vaccine. One dose is recommended after age 69. Talk to your health care provider about which screenings and vaccines you need and how often you need them. This information is not intended to replace advice given to you by your health care provider. Make sure you discuss any questions you have with your health care provider. Document Released: 02/03/2015 Document Revised: 09/27/2015 Document Reviewed: 11/08/2014 Elsevier Interactive Patient Education  2017 [place] Prevention in the Home Falls can cause injuries. They can happen to people of all ages. There are many things you can do to make your home safe and to help prevent falls. What can I do on the outside of my home?  Regularly fix the edges of walkways and driveways and fix any cracks.  Remove anything that might make you trip as you walk through a door, such as a raised step or threshold.  Trim any bushes or trees on the path to your home.  Use bright outdoor lighting.  Clear any walking paths of anything that might make someone trip, such as rocks or tools.  Regularly check to see if handrails are loose or broken. Make sure that both sides of any steps have handrails.  Any raised decks and porches should have guardrails on the edges.  Have any leaves, snow, or ice cleared regularly.  Use sand or salt on walking paths during winter.  Clean up any spills in your garage right away. This includes oil or grease spills. What can I do in the bathroom?  Use night lights.  Install grab bars by the toilet and in the tub and shower. Do not use towel bars as grab bars.  Use non-skid mats or decals in the tub or shower.  If you need to sit down in the shower, use a plastic, non-slip stool.  Keep the floor dry. Clean up any water that spills on the floor as soon  as it happens.  Remove soap buildup in the tub or shower regularly.  Attach bath mats securely with double-sided non-slip rug tape.  Do not have throw rugs and other things on the floor that can make you trip. What can I do in the bedroom?  Use night lights.  Make sure that you have a light by your bed that is easy to reach.  Do not use any sheets or blankets that are too big for your bed. They should not hang down onto the floor.  Have a firm chair that has side arms. You can use this for support while you get dressed.  Do not have throw rugs and other things on the floor that can make you trip. What can I do in the kitchen?  Clean up any spills right away.  Avoid walking on wet floors.  Keep items that you use a lot in easy-to-reach places.  If you need to reach something above you, use a strong step stool that has a grab bar.  Keep electrical cords out of the way.  Do not use floor polish or wax that makes floors slippery. If you must use wax, use non-skid floor wax.  Do not have  throw rugs and other things on the floor that can make you trip. What can I do with my stairs?  Do not leave any items on the stairs.  Make sure that there are handrails on both sides of the stairs and use them. Fix handrails that are broken or loose. Make sure that handrails are as long as the stairways.  Check any carpeting to make sure that it is firmly attached to the stairs. Fix any carpet that is loose or worn.  Avoid having throw rugs at the top or bottom of the stairs. If you do have throw rugs, attach them to the floor with carpet tape.  Make sure that you have a light switch at the top of the stairs and the bottom of the stairs. If you do not have them, ask someone to add them for you. What else can I do to help prevent falls?  Wear shoes that:  Do not have high heels.  Have rubber bottoms.  Are comfortable and fit you well.  Are closed at the toe. Do not wear sandals.  If  you use a stepladder:  Make sure that it is fully opened. Do not climb a closed stepladder.  Make sure that both sides of the stepladder are locked into place.  Ask someone to hold it for you, if possible.  Clearly mark and make sure that you can see:  Any grab bars or handrails.  First and last steps.  Where the edge of each step is.  Use tools that help you move around (mobility aids) if they are needed. These include:  Canes.  Walkers.  Scooters.  Crutches.  Turn on the lights when you go into a dark area. Replace any light bulbs as soon as they burn out.  Set up your furniture so you have a clear path. Avoid moving your furniture around.  If any of your floors are uneven, fix them.  If there are any pets around you, be aware of where they are.  Review your medicines with your doctor. Some medicines can make you feel dizzy. This can increase your chance of falling. Ask your doctor what other things that you can do to help prevent falls. This information is not intended to replace advice given to you by your health care provider. Make sure you discuss any questions you have with your health care provider. Document Released: 11/03/2008 Document Revised: 06/15/2015 Document Reviewed: 02/11/2014 Elsevier Interactive Patient Education  2017 [employer].

## 2018-11-18 NOTE — Progress Notes (Signed)
Subjective:   Melissa Chase is a 78 y.o. female who presents for an Initial Medicare Annual Wellness Visit.  Virtual Visit via Telephone Note  I connected with Melissa Chase on 11/18/18 at  1:20 PM EDT by telephone and verified that I am speaking with the correct person using two identifiers.  Medicare Annual Wellness visit completed telephonically due to Covid-19 pandemic.   Location: Patient: home Provider: office   I discussed the limitations, risks, security and privacy concerns of performing an evaluation and management service by telephone and the availability of in person appointments. The patient expressed understanding and agreed to proceed.  Some vital signs may be absent or patient reported.   Clemetine Marker, LPN    Review of Systems      Cardiac Risk Factors include: advanced age (>16men, >80 women);dyslipidemia;hypertension;obesity (BMI >30kg/m2)     Objective:    Today's Vitals   11/18/18 1329  BP: 122/72  Weight: 223 lb (101.2 kg)  Height: 5\' 6"  (1.676 m)  PainSc: 6    Body mass index is 35.99 kg/m.  Advanced Directives 11/18/2018 09/18/2018 09/03/2018 08/06/2018 02/05/2018 11/04/2017 11/04/2017  Does Patient Have a Medical Advance Directive? Yes No No No No No No  Type of Paramedic of [place];Living will - - - - - -  Copy of [place] in Chart? No - copy requested - - - - - -  Would patient like information on creating a medical advance directive? - No - Patient declined - No - Patient declined No - Patient declined No - Patient declined No - Patient declined    Current Medications (verified) Outpatient Encounter Medications as of 11/18/2018  Medication Sig  . alendronate (FOSAMAX) 70 MG tablet TAKE 1 TABLET BY MOUTH ONCE A WEEK. TAKE WITH A FULL GLASS OF WATER ON AN EMPTY STOMACH.  Marland Kitchen apixaban (ELIQUIS) 5 MG TABS tablet Take 5 mg by mouth 2 (two) times daily.  . calcium-vitamin D (OSCAL WITH D) 500-200  MG-UNIT TABS tablet Take 1 tablet by mouth 2 (two) times daily.   . celecoxib (CELEBREX) 200 MG capsule Take 1 capsule by mouth daily.  . hydrochlorothiazide (MICROZIDE) 12.5 MG capsule TAKE 1 CAPSULE BY MOUTH EVERY DAY  . Melatonin 10 MG TABS Take 20 mg by mouth at bedtime.   . metoprolol succinate (TOPROL-XL) 100 MG 24 hr tablet Take 1 tablet (100 mg total) by mouth daily. Take with or immediately following a meal.  . omeprazole (PRILOSEC) 40 MG capsule TAKE 1 CAPSULE BY MOUTH EVERY DAY  . pravastatin (PRAVACHOL) 20 MG tablet Take 1 tablet (20 mg total) by mouth daily.  . sertraline (ZOLOFT) 100 MG tablet Take 1 tablet (100 mg total) by mouth daily.  Marland Kitchen tiZANidine (ZANAFLEX) 4 MG tablet Take 1 tablet by mouth at bedtime as needed.  . traMADol (ULTRAM) 50 MG tablet Take 1 tablet by mouth every 6 (six) hours as needed.  . [DISCONTINUED] sucralfate (CARAFATE) 1 g tablet Take 1 tablet (1 g total) by mouth 2 (two) times daily.  . [DISCONTINUED] tamoxifen (NOLVADEX) 20 MG tablet TAKE 1 TABLET BY MOUTH EVERY DAY  . [DISCONTINUED] tamoxifen (NOLVADEX) 20 MG tablet TAKE 1 TABLET BY MOUTH EVERY DAY   No facility-administered encounter medications on file as of 11/18/2018.     Allergies (verified) Patient has no known allergies.   History: Past Medical History:  Diagnosis Date  . Afib ([place])   . Arthritis   . Breast  cancer [facility]) 2006   left breast  . Breast cancer ([place]) 2019   right breast  . Depression   . GERD (gastroesophageal reflux disease)   . Hypertension   . Insomnia   . Insomnia   . Personal history of radiation therapy 2006   36 tx.  . Personal history of radiation therapy 2019   Past Surgical History:  Procedure Laterality Date  . BREAST LUMPECTOMY Right 07/09/2017  . BREAST LUMPECTOMY Left 2006  . BREAST LUMPECTOMY WITH NEEDLE LOCALIZATION Right 07/09/2017   Procedure: BREAST LUMPECTOMY WITH NEEDLE LOCALIZATION;  Surgeon: Herbert Pun, MD;  Location: [facility] ORS;   Service: General;  Laterality: Right;  . BREAST SURGERY     left breast lumpectomy  . CARDIOVERSION N/A 03/21/2016   Procedure: Cardioversion;  Surgeon: Corey Skains, MD;  Location: [facility] ORS;  Service: Cardiovascular;  Laterality: N/A;  . CATARACT EXTRACTION, BILATERAL    . LAPAROSCOPIC CHOLECYSTECTOMY    . TONSILLECTOMY    . VAGINAL HYSTERECTOMY     Family History  Problem Relation Age of Onset  . Diabetes Sister   . Breast cancer Daughter    Social History   Socioeconomic History  . Marital status: Widowed    Spouse name: Not on file  . Number of children: 3  . Years of education: Not on file  . Highest education level: Not on file  Occupational History  . Not on file  Social Needs  . Financial resource strain: Not hard at all  . Food insecurity    Worry: Never true    Inability: Never true  . Transportation needs    Medical: No    Non-medical: No  Tobacco Use  . Smoking status: Never Smoker  . Smokeless tobacco: Never Used  Substance and Sexual Activity  . Alcohol use: Never    Frequency: Never  . Drug use: No  . Sexual activity: Not Currently  Lifestyle  . Physical activity    Days per week: 0 days    Minutes per session: 0 min  . Stress: To some extent  Relationships  . Social Herbalist on phone: Patient refused    Gets together: Patient refused    Attends religious service: Patient refused    Active member of club or organization: Patient refused    Attends meetings of clubs or organizations: Patient refused    Relationship status: Widowed  Other Topics Concern  . Not on file  Social History Narrative  . Not on file    Tobacco Counseling Counseling given: Not Answered   Clinical Intake:  Pre-visit preparation completed: Yes  Pain : 0-10 Pain Score: 6  Pain Type: Chronic pain Pain Location: Back Pain Orientation: Lower Pain Descriptors / Indicators: Aching, Discomfort, Sore Pain Onset: More than a month ago Pain Frequency:  Constant     BMI - recorded: 35.99 Nutritional Status: BMI > 30  Obese Nutritional Risks: None Diabetes: No  How often do you need to have someone help you when you read instructions, pamphlets, or other written materials from your doctor or pharmacy?: 1 - Never  Interpreter Needed?: No  Information entered by :: Clemetine Marker LPN   Activities of Daily Living In your present state of health, do you have any difficulty performing the following activities: 11/18/2018  Hearing? N  Comment declines hearing aids  Vision? N  Difficulty concentrating or making decisions? N  Walking or climbing stairs? N  Dressing or bathing? N  Doing errands,  shopping? N  Preparing Food and eating ? N  Using the Toilet? N  In the past six months, have you accidently leaked urine? N  Do you have problems with loss of bowel control? N  Managing your Medications? N  Managing your Finances? N  Housekeeping or managing your Housekeeping? N  Some recent data might be hidden     Immunizations and Health Maintenance Immunization History  Administered Date(s) Administered  . Influenza,inj,Quad PF,6+ Mos 01/01/2013, 12/02/2013, 12/05/2014  . Influenza-Unspecified 12/02/2013  . Pneumococcal Conjugate-13 10/22/2018   There are no preventive care reminders to display for this patient.  Patient Care Team: Juline Patch, MD as PCP - General (Family Medicine)  Indicate any recent Medical Services you may have received from other than [facility] providers in the past year (date may be approximate).     Assessment:   This is a routine wellness examination for [place].  Hearing/Vision screen  Hearing Screening   125Hz  250Hz  500Hz  1000Hz  2000Hz  3000Hz  4000Hz  6000Hz  8000Hz   Right ear:           Left ear:           Comments: Pt denies hearing difficulty   Vision Screening Comments: Annual vision screening done at the [facility] in [place] issues and exercise activities discussed: Current  Exercise Habits: The patient does not participate in regular exercise at present, Exercise limited by: orthopedic condition(s)  Goals    . DIET - INCREASE WATER INTAKE     Recommend drinking 6-8 glasses of water per day      Depression Screen PHQ 2/9 Scores 11/18/2018 10/01/2018 08/21/2018 07/16/2018 07/22/2017 02/04/2017 11/27/2015  PHQ - 2 Score 2 0 0 0 0 6 0  PHQ- 9 Score 9 - 8 0 - 16 -    Fall Risk Fall Risk  11/18/2018 11/18/2018 07/22/2017 02/04/2017 11/27/2015  Falls in the past year? 1 1 No Yes No  Number falls in past yr: 1 1 - 2 or more -  Comment - - - in yard, muddy- fell -  Injury with Fall? 1 1 - No -  Risk for fall due to : Impaired balance/gait;History of fall(s) Impaired balance/gait;History of fall(s) - - -  Follow up Falls prevention discussed Falls prevention discussed - Falls evaluation completed -    FALL RISK PREVENTION PERTAINING TO THE HOME:  Any stairs in or around the home? Yes  If so, do they handrails? Yes   Home free of loose throw rugs in walkways, pet beds, electrical cords, etc? Yes  Adequate lighting in your home to reduce risk of falls? Yes   ASSISTIVE DEVICES UTILIZED TO PREVENT FALLS:  Life alert? No  Use of a cane, walker or w/c? No  Grab bars in the bathroom? No  Shower chair or bench in shower? No  Elevated toilet seat or a handicapped toilet? No   DME ORDERS:  DME order needed?  No   TIMED UP AND GO:  Was the test performed? No . Telephonic visit.   Education: Fall risk prevention has been discussed.  Intervention(s) required? No   Cognitive Function:5     6CIT Screen 11/18/2018  What Year? 0 points  What month? 0 points  What time? 0 points  Count back from 20 0 points  Months in reverse 0 points  Repeat phrase 2 points  Total Score 2    Screening Tests Health Maintenance  Topic Date Due  . INFLUENZA VACCINE  12/01/2018 (Originally 08/22/2018)  .  TETANUS/TDAP  10/22/2019 (Originally 01/10/1960)  . PNA vac Low Risk Adult  (2 of 2 - PPSV23) 10/22/2019  . DEXA SCAN  Completed    Qualifies for Shingles Vaccine? Yes . Due for Shingrix. Education has been provided regarding the importance of this vaccine. Pt has been advised to call insurance company to determine out of pocket expense. Advised may also receive vaccine at local pharmacy or Health Dept. Verbalized acceptance and understanding.  Tdap: Although this vaccine is not a covered service during a Wellness Exam, does the patient still wish to receive this vaccine today?  No .  Education has been provided regarding the importance of this vaccine. Advised may receive this vaccine at local pharmacy or Health Dept. Aware to provide a copy of the vaccination record if obtained from local pharmacy or Health Dept. Verbalized acceptance and understanding.  Flu Vaccine: Due for Flu vaccine. Does the patient want to receive this vaccine today?  No . Education has been provided regarding the importance of this vaccine but still declined. Advised may receive this vaccine at local pharmacy or Health Dept. Aware to provide a copy of the vaccination record if obtained from local pharmacy or Health Dept. Verbalized acceptance and understanding.  Pneumococcal Vaccine: Due for PPSV23 10/2019.  Cancer Screenings:  Colorectal Screening: Completed 07/30/16.   Mammogram: Completed 06/30/18. Repeat every year.   Bone Density: Completed 08/04/17. Results reflect  OSTEOPENIA. Repeat every 2 years.   Lung Cancer Screening: (Low Dose CT Chest recommended if Age 43-80 years, 30 pack-year currently smoking OR have quit w/in 15years.) does not qualify.   Additional Screening:  Hepatitis C Screening: no longer required  Vision Screening: Recommended annual ophthalmology exams for early detection of glaucoma and other disorders of the eye. Is the patient up to date with their annual eye exam?  Yes  Who is the provider or what is the name of the office in which the pt attends annual eye  exams? Dr. Ruthann Cancer [facility].  Dental Screening: Recommended annual dental exams for proper oral hygiene  Community Resource Referral:  CRR required this visit?  No      Plan:    I have personally reviewed and addressed the Medicare Annual Wellness questionnaire and have noted the following in the patient's chart:  A. Medical and social history B. Use of alcohol, tobacco or illicit drugs  C. Current medications and supplements D. Functional ability and status E.  Nutritional status F.  Physical activity G. Advance directives H. List of other physicians I.  Hospitalizations, surgeries, and ER visits in previous 12 months J.  [place] such as hearing and vision if needed, cognitive and depression L. Referrals and appointments   In addition, I have reviewed and discussed with patient certain preventive protocols, quality metrics, and best practice recommendations. A written personalized care plan for preventive services as well as general preventive health recommendations were provided to patient.   Signed,  Clemetine Marker, LPN Nurse Health Advisor   Nurse Notes: none

## 2018-11-30 ENCOUNTER — Encounter: Payer: Self-pay | Admitting: Family Medicine

## 2018-11-30 ENCOUNTER — Other Ambulatory Visit: Payer: Self-pay

## 2018-11-30 ENCOUNTER — Ambulatory Visit (INDEPENDENT_AMBULATORY_CARE_PROVIDER_SITE_OTHER): Payer: Medicare Other | Admitting: Family Medicine

## 2018-11-30 VITALS — BP 120/70 | HR 60 | Ht 66.0 in | Wt 229.0 lb

## 2018-11-30 DIAGNOSIS — F339 Major depressive disorder, recurrent, unspecified: Secondary | ICD-10-CM

## 2018-11-30 MED ORDER — SERTRALINE HCL 100 MG PO TABS: 100.0000 mg | ORAL_TABLET | Freq: Every day | ORAL | 1 refills | Status: DC

## 2018-11-30 NOTE — Progress Notes (Signed)
Date:  11/30/2018   Name:  Melissa Chase   DOB:  [birth year]   MRN:  [id number]   Chief Complaint: Follow-up (has gained weight since last visit and depression improved from a 9 to a 2)  Depression        This is a chronic problem.  The current episode started more than 1 year ago.   The onset quality is gradual.   The problem occurs intermittently.  The problem has been gradually improving since onset.  Associated symptoms include myalgias and sad.  Associated symptoms include no decreased concentration, no fatigue, no helplessness, no hopelessness, does not have insomnia, not irritable, no restlessness, no decreased interest, no appetite change, no body aches, no headaches, no indigestion and no suicidal ideas.  Past treatments include SSRIs - Selective serotonin reuptake inhibitors.  Previous treatment provided mild relief.   @ Lab Results  Component Value Date   CREATININE 0.98 09/03/2018   BUN 13 09/03/2018   NA 144 09/03/2018   K 3.5 09/03/2018   CL 107 09/03/2018   CO2 29 09/03/2018   @ Lab Results  Component Value Date   CHOL 180 07/16/2018   HDL 75 07/16/2018   LDLCALC 87 07/16/2018   TRIG 92 07/16/2018   CHOLHDL 3.8 07/30/2016   @ Lab Results  Component Value Date   TSH 2.210 09/03/2018   @ Lab Results  Component Value Date   HGBA1C 5.4 01/01/2013     Review of Systems  Constitutional: Negative for appetite change, chills, fatigue and fever.  HENT: Negative for drooling, ear discharge, ear pain and sore throat.   Respiratory: Negative for cough, shortness of breath and wheezing.   Cardiovascular: Negative for chest pain, palpitations and leg swelling.  Gastrointestinal: Negative for abdominal pain, blood in stool, constipation, diarrhea and nausea.  Endocrine: Negative for polydipsia.  Genitourinary: Negative for dysuria, frequency, hematuria and urgency.  Musculoskeletal: Positive for myalgias. Negative for back pain and neck pain.  Skin: Negative  for rash.  Allergic/Immunologic: Negative for environmental allergies.  Neurological: Negative for dizziness and headaches.  Hematological: Does not bruise/bleed easily.  Psychiatric/Behavioral: Positive for depression. Negative for decreased concentration and suicidal ideas. The patient is not nervous/anxious and does not have insomnia.     Patient Active Problem List   Diagnosis Date Noted  . Obesity (BMI 30-39.9) 10/26/2018  . 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 Goals of care, counseling/discussion 09/18/2017  . Sacroiliitis ([place]) 02/04/2017  . Dilated cardiomyopathy ([place]) 05/02/2016  . Bradycardia 04/04/2016  . A-fib ([place]) 03/20/2016  . Acute dyspnea 02/20/2016  . 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 Arthritis 06/06/2014  . Aggrieved 06/06/2014  . Dermatitis, eczematoid 06/06/2014  . Recurrent major depressive episodes ([place]) 06/06/2014  . Essential (primary) hypertension 06/06/2014  . Gastro-esophageal reflux disease without esophagitis 06/06/2014  . Cannot sleep 06/06/2014  . Menopause 06/06/2014    No Known Allergies  Past Surgical History:  Procedure Laterality Date  . BREAST LUMPECTOMY Right 07/09/2017  . BREAST LUMPECTOMY Left 2006  . BREAST LUMPECTOMY WITH NEEDLE LOCALIZATION Right 07/09/2017   Procedure: BREAST LUMPECTOMY WITH NEEDLE LOCALIZATION;  Surgeon: Herbert Pun, MD;  Location: [facility] ORS;  Service: General;  Laterality: Right;  . BREAST SURGERY     left breast lumpectomy  . CARDIOVERSION N/A 03/21/2016   Procedure: Cardioversion;  Surgeon: Corey Skains, MD;  Location: [facility] ORS;  Service: Cardiovascular;  Laterality: N/A;  .  CATARACT EXTRACTION, BILATERAL    . LAPAROSCOPIC CHOLECYSTECTOMY    . TONSILLECTOMY    . VAGINAL HYSTERECTOMY      Social History   Tobacco Use  . Smoking status: Never Smoker  . Smokeless tobacco: Never Used  Substance Use  Topics  . Alcohol use: Never    Frequency: Never  . Drug use: No     Medication list has been reviewed and updated.  Current Meds  Medication Sig  . alendronate (FOSAMAX) 70 MG tablet TAKE 1 TABLET BY MOUTH ONCE A WEEK. TAKE WITH A FULL GLASS OF WATER ON AN EMPTY STOMACH.  Marland Kitchen apixaban (ELIQUIS) 5 MG TABS tablet Take 5 mg by mouth 2 (two) times daily.  . calcium-vitamin D (OSCAL WITH D) 500-200 MG-UNIT TABS tablet Take 1 tablet by mouth 2 (two) times daily.   . hydrochlorothiazide (MICROZIDE) 12.5 MG capsule TAKE 1 CAPSULE BY MOUTH EVERY DAY  . Melatonin 10 MG TABS Take 20 mg by mouth at bedtime.   . metoprolol succinate (TOPROL-XL) 100 MG 24 hr tablet Take 1 tablet (100 mg total) by mouth daily. Take with or immediately following a meal.  . omeprazole (PRILOSEC) 40 MG capsule TAKE 1 CAPSULE BY MOUTH EVERY DAY  . pravastatin (PRAVACHOL) 20 MG tablet Take 1 tablet (20 mg total) by mouth daily.  . sertraline (ZOLOFT) 100 MG tablet Take 1 tablet (100 mg total) by mouth daily.  Marland Kitchen tiZANidine (ZANAFLEX) 4 MG tablet Take 1 tablet by mouth at bedtime as needed.  . traMADol (ULTRAM) 50 MG tablet Take 1 tablet by mouth every 6 (six) hours as needed.    PHQ 2/9 Scores 11/30/2018 11/18/2018 10/01/2018 08/21/2018  PHQ - 2 Score 1 2 0 0  PHQ- 9 Score 2 9 - 8    BP Readings from Last 3 Encounters:  11/30/18 120/70  11/18/18 122/72  10/22/18 122/72    Physical Exam Vitals signs and nursing note reviewed. Exam conducted with a chaperone present.  Constitutional:      General: She is not irritable.    Appearance: She is well-developed.  HENT:     Head: Normocephalic.     Right Ear: Tympanic membrane, ear canal and external ear normal.     Left Ear: Tympanic membrane, ear canal and external ear normal.     Nose: Nose normal.     Mouth/Throat:     Mouth: Mucous membranes are moist.  Eyes:     General: Lids are everted, no foreign bodies appreciated. No scleral icterus.       Left eye: No  foreign body or hordeolum.     Conjunctiva/sclera: Conjunctivae normal.     Right eye: Right conjunctiva is not injected.     Left eye: Left conjunctiva is not injected.     Pupils: Pupils are equal, round, and reactive to light.  Neck:     Musculoskeletal: Normal range of motion and neck supple.     Thyroid: No thyromegaly.     Vascular: No JVD.     Trachea: No tracheal deviation.  Cardiovascular:     Rate and Rhythm: Normal rate and regular rhythm.     Heart sounds: Normal heart sounds. No murmur. No friction rub. No gallop.   Pulmonary:     Effort: Pulmonary effort is normal. No respiratory distress.     Breath sounds: Normal breath sounds. No wheezing, rhonchi or rales.  Abdominal:     General: Bowel sounds are normal.     Palpations:  Abdomen is soft. There is no hepatomegaly, splenomegaly or mass.     Tenderness: There is no abdominal tenderness. There is no guarding or rebound.  Musculoskeletal: Normal range of motion.        General: No tenderness.  Lymphadenopathy:     Cervical: No cervical adenopathy.  Skin:    General: Skin is warm.     Capillary Refill: Capillary refill takes less than 2 seconds.     Findings: No rash.  Neurological:     Mental Status: She is alert and oriented to person, place, and time.     Cranial Nerves: No cranial nerve deficit.     Deep Tendon Reflexes: Reflexes normal.  Psychiatric:        Mood and Affect: Mood is not anxious or depressed.     Wt Readings from Last 3 Encounters:  11/30/18 229 lb (103.9 kg)  11/18/18 223 lb (101.2 kg)  10/22/18 225 lb (102.1 kg)    BP 120/70   Pulse 60   Ht 5\' 6"  (1.676 m)   Wt 229 lb (103.9 kg)   BMI 36.96 kg/m   Assessment and Plan:  1. Recurrent major depressive episodes (HCC) Chronic.  Controlled.  PHQ went from 9-2 and gad score is 2 as well.  We will continue sertraline 100 mg once a day.  Will reevaluate in 6 months. - sertraline (ZOLOFT) 100 MG tablet; Take 1 tablet (100 mg total) by mouth  daily.  Dispense: 90 tablet; Refill: 1

## 2019-01-07 ENCOUNTER — Other Ambulatory Visit: Payer: Self-pay | Admitting: Family Medicine

## 2019-01-07 DIAGNOSIS — I10 Essential (primary) hypertension: Secondary | ICD-10-CM

## 2019-01-12 ENCOUNTER — Other Ambulatory Visit: Payer: Self-pay | Admitting: Family Medicine

## 2019-01-12 DIAGNOSIS — K219 Gastro-esophageal reflux disease without esophagitis: Secondary | ICD-10-CM

## 2019-01-27 DIAGNOSIS — Z6841 Body Mass Index (BMI) 40.0 and over, adult: Secondary | ICD-10-CM | POA: Diagnosis not present

## 2019-01-27 DIAGNOSIS — G8929 Other chronic pain: Secondary | ICD-10-CM | POA: Diagnosis not present

## 2019-01-27 DIAGNOSIS — M546 Pain in thoracic spine: Secondary | ICD-10-CM | POA: Diagnosis not present

## 2019-01-27 DIAGNOSIS — S338XXA Sprain of other parts of lumbar spine and pelvis, initial encounter: Secondary | ICD-10-CM | POA: Diagnosis not present

## 2019-01-27 DIAGNOSIS — M545 Low back pain: Secondary | ICD-10-CM | POA: Diagnosis not present

## 2019-02-01 ENCOUNTER — Other Ambulatory Visit: Payer: Self-pay | Admitting: Oncology

## 2019-02-04 DIAGNOSIS — I480 Paroxysmal atrial fibrillation: Secondary | ICD-10-CM | POA: Diagnosis not present

## 2019-02-05 ENCOUNTER — Ambulatory Visit: Payer: Medicare Other | Admitting: Oncology

## 2019-02-09 DIAGNOSIS — M549 Dorsalgia, unspecified: Secondary | ICD-10-CM | POA: Diagnosis not present

## 2019-02-09 DIAGNOSIS — G8929 Other chronic pain: Secondary | ICD-10-CM | POA: Diagnosis not present

## 2019-02-09 DIAGNOSIS — M6281 Muscle weakness (generalized): Secondary | ICD-10-CM | POA: Diagnosis not present

## 2019-02-09 DIAGNOSIS — M2569 Stiffness of other specified joint, not elsewhere classified: Secondary | ICD-10-CM | POA: Diagnosis not present

## 2019-02-09 DIAGNOSIS — M546 Pain in thoracic spine: Secondary | ICD-10-CM | POA: Diagnosis not present

## 2019-02-09 DIAGNOSIS — M545 Low back pain: Secondary | ICD-10-CM | POA: Diagnosis not present

## 2019-02-14 ENCOUNTER — Other Ambulatory Visit: Payer: Self-pay | Admitting: Family Medicine

## 2019-02-14 DIAGNOSIS — I10 Essential (primary) hypertension: Secondary | ICD-10-CM

## 2019-02-17 ENCOUNTER — Ambulatory Visit: Payer: Medicare Other | Admitting: Radiation Oncology

## 2019-02-18 ENCOUNTER — Ambulatory Visit: Payer: Medicare Other | Attending: Radiation Oncology | Admitting: Radiation Oncology

## 2019-02-18 ENCOUNTER — Ambulatory Visit: Payer: Medicare Other | Admitting: Oncology

## 2019-02-23 ENCOUNTER — Ambulatory Visit: Payer: Medicare Other | Admitting: Family Medicine

## 2019-02-25 ENCOUNTER — Encounter: Payer: Self-pay | Admitting: Family Medicine

## 2019-02-25 ENCOUNTER — Ambulatory Visit (INDEPENDENT_AMBULATORY_CARE_PROVIDER_SITE_OTHER): Payer: Medicare Other | Admitting: Family Medicine

## 2019-02-25 ENCOUNTER — Other Ambulatory Visit: Payer: Self-pay

## 2019-02-25 VITALS — BP 138/72 | HR 68 | Ht 66.0 in | Wt 229.0 lb

## 2019-02-25 DIAGNOSIS — K219 Gastro-esophageal reflux disease without esophagitis: Secondary | ICD-10-CM

## 2019-02-25 DIAGNOSIS — M810 Age-related osteoporosis without current pathological fracture: Secondary | ICD-10-CM

## 2019-02-25 DIAGNOSIS — E785 Hyperlipidemia, unspecified: Secondary | ICD-10-CM

## 2019-02-25 DIAGNOSIS — F339 Major depressive disorder, recurrent, unspecified: Secondary | ICD-10-CM | POA: Diagnosis not present

## 2019-02-25 DIAGNOSIS — I10 Essential (primary) hypertension: Secondary | ICD-10-CM

## 2019-02-25 MED ORDER — PRAVASTATIN SODIUM 20 MG PO TABS: 20.0000 mg | ORAL_TABLET | Freq: Every day | ORAL | 1 refills | Status: DC

## 2019-02-25 MED ORDER — ALENDRONATE SODIUM 70 MG PO TABS: ORAL_TABLET | ORAL | 1 refills | Status: DC

## 2019-02-25 MED ORDER — HYDROCHLOROTHIAZIDE 12.5 MG PO CAPS: 12.5000 mg | ORAL_CAPSULE | Freq: Every day | ORAL | 1 refills | Status: DC

## 2019-02-25 MED ORDER — OMEPRAZOLE 40 MG PO CPDR: 40.0000 mg | DELAYED_RELEASE_CAPSULE | Freq: Every day | ORAL | 1 refills | Status: DC

## 2019-02-25 MED ORDER — METOPROLOL SUCCINATE ER 100 MG PO TB24: 100.0000 mg | ORAL_TABLET | Freq: Every day | ORAL | 1 refills | Status: DC

## 2019-02-25 MED ORDER — SERTRALINE HCL 100 MG PO TABS: 100.0000 mg | ORAL_TABLET | Freq: Every day | ORAL | 1 refills | Status: DC

## 2019-02-25 NOTE — Progress Notes (Signed)
Date:  02/25/2019   Name:  Melissa Chase   DOB:  05/01/1940   MRN:  [id number]   Chief Complaint: Hypertension, Hyperlipidemia, Depression (PHQ9-0 GAD7=1), Gastroesophageal Reflux, and Osteoporosis (has not been taking but is going to do better)  Hypertension This is a chronic problem. The current episode started more than 1 year ago. The problem has been gradually worsening since onset. The problem is controlled. Pertinent negatives include no anxiety, blurred vision, chest pain, headaches, malaise/fatigue, neck pain, orthopnea, palpitations, peripheral edema, PND, shortness of breath or sweats. There are no associated agents to hypertension. Risk factors for coronary artery disease include dyslipidemia. Past treatments include beta blockers and diuretics. The current treatment provides moderate improvement. There are no compliance problems.  There is no history of angina, kidney disease, CAD/MI, CVA, heart failure, left ventricular hypertrophy, PVD or retinopathy. There is no history of chronic renal disease, a hypertension causing med or renovascular disease.  Hyperlipidemia This is a chronic problem. The current episode started more than 1 year ago. The problem is controlled. Recent lipid tests were reviewed and are normal. She has no history of chronic renal disease, diabetes, hypothyroidism, liver disease, obesity or nephrotic syndrome. Factors aggravating her hyperlipidemia include thiazides. Pertinent negatives include no chest pain, myalgias or shortness of breath. Current antihyperlipidemic treatment includes statins. The current treatment provides moderate improvement of lipids. There are no compliance problems.  Risk factors for coronary artery disease include dyslipidemia, hypertension and female sex.  Depression        This is a chronic problem.  The current episode started more than 1 year ago.   The onset quality is gradual.   The problem occurs intermittently.  The problem has been  gradually improving since onset.  Associated symptoms include insomnia, irritable and restlessness.  Associated symptoms include no decreased concentration, no fatigue, no helplessness, no hopelessness, no decreased interest, no appetite change, no body aches, no myalgias, no headaches, no indigestion, not sad and no suicidal ideas.  Past treatments include SSRIs - Selective serotonin reuptake inhibitors.  Compliance with treatment is good.  Previous treatment provided moderate relief.   Pertinent negatives include no hypothyroidism and no anxiety. Gastroesophageal Reflux She reports no abdominal pain, no belching, no chest pain, no choking, no coughing, no dysphagia, no early satiety, no globus sensation, no heartburn, no hoarse voice, no nausea, no sore throat or no wheezing. This is a chronic problem. The current episode started more than 1 year ago. The problem occurs constantly. The problem has been gradually improving. The symptoms are aggravated by certain foods. Pertinent negatives include no anemia, fatigue, melena, muscle weakness, orthopnea or weight loss. There are no known risk factors. She has tried a PPI for the symptoms. The treatment provided significant relief. Past procedures do not include an abdominal ultrasound, an EGD, esophageal manometry, esophageal pH monitoring, H. pylori antibody titer or a UGI.    Lab Results  Component Value Date   CREATININE 0.98 09/03/2018   BUN 13 09/03/2018   NA 144 09/03/2018   K 3.5 09/03/2018   CL 107 09/03/2018   CO2 29 09/03/2018   Lab Results  Component Value Date   CHOL 180 07/16/2018   HDL 75 07/16/2018   LDLCALC 87 07/16/2018   TRIG 92 07/16/2018   CHOLHDL 3.8 07/30/2016   Lab Results  Component Value Date   TSH 2.210 09/03/2018   Lab Results  Component Value Date   HGBA1C 5.4 01/01/2013  Review of Systems  Constitutional: Negative.  Negative for appetite change, chills, fatigue, fever, malaise/fatigue, unexpected weight  change and weight loss.  HENT: Negative for congestion, ear discharge, ear pain, hoarse voice, rhinorrhea, sinus pressure, sneezing and sore throat.   Eyes: Negative for blurred vision, photophobia, pain, discharge, redness and itching.  Respiratory: Negative for cough, choking, shortness of breath, wheezing and stridor.   Cardiovascular: Negative for chest pain, palpitations, orthopnea and PND.  Gastrointestinal: Negative for abdominal pain, blood in stool, constipation, diarrhea, dysphagia, heartburn, melena, nausea and vomiting.  Endocrine: Negative for cold intolerance, heat intolerance, polydipsia, polyphagia and polyuria.  Genitourinary: Negative for dysuria, flank pain, frequency, hematuria, menstrual problem, pelvic pain, urgency, vaginal bleeding and vaginal discharge.  Musculoskeletal: Negative for arthralgias, back pain, myalgias, muscle weakness and neck pain.  Skin: Negative for rash.  Allergic/Immunologic: Negative for environmental allergies and food allergies.  Neurological: Negative for dizziness, weakness, light-headedness, numbness and headaches.  Hematological: Negative for adenopathy. Does not bruise/bleed easily.  Psychiatric/Behavioral: Positive for depression. Negative for decreased concentration, dysphoric mood and suicidal ideas. The patient has insomnia. The patient is not nervous/anxious.     Patient Active Problem List   Diagnosis Date Noted  . Obesity (BMI 30-39.9) 10/26/2018  . 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 Goals of care, counseling/discussion 09/18/2017  . Sacroiliitis ([place]) 02/04/2017  . Dilated cardiomyopathy ([place]) 05/02/2016  . Bradycardia 04/04/2016  . A-fib ([place]) 03/20/2016  . Acute dyspnea 02/20/2016  . 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 Arthritis 06/06/2014  . Aggrieved 06/06/2014  . Dermatitis, eczematoid 06/06/2014  . Recurrent major depressive  episodes ([place]) 06/06/2014  . Essential (primary) hypertension 06/06/2014  . Gastro-esophageal reflux disease without esophagitis 06/06/2014  . Cannot sleep 06/06/2014  . Menopause 06/06/2014    No Known Allergies  Past Surgical History:  Procedure Laterality Date  . BREAST LUMPECTOMY Right 07/09/2017  . BREAST LUMPECTOMY Left 2006  . BREAST LUMPECTOMY WITH NEEDLE LOCALIZATION Right 07/09/2017   Procedure: BREAST LUMPECTOMY WITH NEEDLE LOCALIZATION;  Surgeon: Herbert Pun, MD;  Location: [facility] ORS;  Service: General;  Laterality: Right;  . BREAST SURGERY     left breast lumpectomy  . CARDIOVERSION N/A 03/21/2016   Procedure: Cardioversion;  Surgeon: Corey Skains, MD;  Location: [facility] ORS;  Service: Cardiovascular;  Laterality: N/A;  . CATARACT EXTRACTION, BILATERAL    . LAPAROSCOPIC CHOLECYSTECTOMY    . TONSILLECTOMY    . VAGINAL HYSTERECTOMY      Social History   Tobacco Use  . Smoking status: Never Smoker  . Smokeless tobacco: Never Used  Substance Use Topics  . Alcohol use: Never  . Drug use: No     Medication list has been reviewed and updated.  Current Meds  Medication Sig  . alendronate (FOSAMAX) 70 MG tablet TAKE 1 TABLET BY MOUTH ONCE A WEEK. TAKE WITH A FULL GLASS OF WATER ON AN EMPTY STOMACH.  Marland Kitchen apixaban (ELIQUIS) 5 MG TABS tablet Take 5 mg by mouth 2 (two) times daily.  . calcium-vitamin D (OSCAL WITH D) 500-200 MG-UNIT TABS tablet Take 1 tablet by mouth 2 (two) times daily.   . hydrochlorothiazide (MICROZIDE) 12.5 MG capsule TAKE 1 CAPSULE BY MOUTH EVERY DAY  . Melatonin 10 MG TABS Take 20 mg by mouth at bedtime.   . metoprolol succinate (TOPROL-XL) 100 MG 24 hr tablet TAKE 1 TABLET (100 MG TOTAL) BY MOUTH DAILY. TAKE WITH OR IMMEDIATELY FOLLOWING A MEAL.  Marland Kitchen  omeprazole (PRILOSEC) 40 MG capsule TAKE 1 CAPSULE BY MOUTH EVERY DAY  . pravastatin (PRAVACHOL) 20 MG tablet Take 1 tablet (20 mg total) by mouth daily.  . sertraline (ZOLOFT) 100 MG tablet Take  1 tablet (100 mg total) by mouth daily.  . tamoxifen (NOLVADEX) 20 MG tablet TAKE 1 TABLET BY MOUTH EVERY DAY    PHQ 2/9 Scores 02/25/2019 11/30/2018 11/18/2018 10/01/2018  PHQ - 2 Score 0 1 2 0  PHQ- 9 Score 0 2 9 -    BP Readings from Last 3 Encounters:  02/25/19 138/72  11/30/18 120/70  11/18/18 122/72    Physical Exam Vitals and nursing note reviewed.  Constitutional:      General: She is irritable. She is not in acute distress.    Appearance: She is not diaphoretic.  HENT:     Head: Normocephalic and atraumatic.     Right Ear: Tympanic membrane, ear canal and external ear normal.     Left Ear: Tympanic membrane, ear canal and external ear normal.     Nose: Nose normal. No congestion or rhinorrhea.     Mouth/Throat:     Mouth: Mucous membranes are moist.  Eyes:     General:        Right eye: No discharge.        Left eye: No discharge.     Conjunctiva/sclera: Conjunctivae normal.     Pupils: Pupils are equal, round, and reactive to light.  Neck:     Thyroid: No thyromegaly.     Vascular: No JVD.  Cardiovascular:     Rate and Rhythm: Normal rate and regular rhythm.     Heart sounds: Normal heart sounds. No murmur. No friction rub. No gallop.   Pulmonary:     Effort: Pulmonary effort is normal. No respiratory distress.     Breath sounds: Normal breath sounds. No stridor. No wheezing, rhonchi or rales.  Chest:     Chest wall: No tenderness.  Abdominal:     General: Bowel sounds are normal.     Palpations: Abdomen is soft. There is no mass.     Tenderness: There is no abdominal tenderness. There is no guarding.  Musculoskeletal:        General: Normal range of motion.     Cervical back: Normal range of motion and neck supple.  Lymphadenopathy:     Cervical: No cervical adenopathy.  Skin:    General: Skin is warm and dry.     Capillary Refill: Capillary refill takes less than 2 seconds.     Coloration: Skin is not jaundiced or pale.     Findings: No bruising,  erythema, lesion or rash.  Neurological:     General: No focal deficit present.     Mental Status: She is alert.     Deep Tendon Reflexes: Reflexes are normal and symmetric.     Wt Readings from Last 3 Encounters:  02/25/19 229 lb (103.9 kg)  11/30/18 229 lb (103.9 kg)  11/18/18 223 lb (101.2 kg)    BP 138/72   Pulse 68   Ht 5\' 6"  (1.676 m)   Wt 229 lb (103.9 kg)   BMI 36.96 kg/m   Assessment and Plan:  1. Dyslipidemia Chronic.  Controlled.  Stable.  Continue pravastatin 20 mg once a day.  Will check lipid panel. - pravastatin (PRAVACHOL) 20 MG tablet; Take 1 tablet (20 mg total) by mouth daily.  Dispense: 90 tablet; Refill: 1 - Lipid Panel With LDL/HDL Ratio  2. Essential hypertension Chronic.  Controlled.  Stable.  Continue hydrochlorothiazide 12.5 mg once a day as well as metoprolol XL 100 mg once a day.  Will check renal function panel. - hydrochlorothiazide (MICROZIDE) 12.5 MG capsule; Take 1 capsule (12.5 mg total) by mouth daily.  Dispense: 90 capsule; Refill: 1 - metoprolol succinate (TOPROL-XL) 100 MG 24 hr tablet; Take 1 tablet (100 mg total) by mouth daily. Take with or immediately following a meal.  Dispense: 90 tablet; Refill: 1 - Renal Function Panel  3. Gastroesophageal reflux disease without esophagitis Chronic.  Controlled.  Stable.  Continue omeprazole 40 mg once a day. - omeprazole (PRILOSEC) 40 MG capsule; Take 1 capsule (40 mg total) by mouth daily.  Dispense: 90 capsule; Refill: 1  4. Recurrent major depressive episodes (HCC) Chronic.  Controlled.  Stable.  PHQ score 0 Gad score 1 (irritability).  We will continue sertraline 100 mg once a day.  Will recheck in 6 months. - sertraline (ZOLOFT) 100 MG tablet; Take 1 tablet (100 mg total) by mouth daily.  Dispense: 90 tablet; Refill: 1  5. Age related osteoporosis, unspecified pathological fracture presence Age-related osteoporosis was noted and patient will pick up prescription for Fosamax and initiate  she just did not want to drink all the water that was associated with and I have not found her we will do this for maybe a year and then stop. - alendronate (FOSAMAX) 70 MG tablet; TAKE 1 TABLET BY MOUTH ONCE A WEEK. TAKE WITH A FULL GLASS OF WATER ON AN EMPTY STOMACH.  Dispense: 12 tablet; Refill: 1

## 2019-02-26 LAB — RENAL FUNCTION PANEL
Albumin: 4 g/dL (ref 3.7–4.7)
BUN/Creatinine Ratio: 15 (ref 12–28)
BUN: 15 mg/dL (ref 8–27)
CO2: 28 mmol/L (ref 20–29)
Calcium: 9.1 mg/dL (ref 8.7–10.3)
Chloride: 104 mmol/L (ref 96–106)
Creatinine, Ser: 0.98 mg/dL (ref 0.57–1.00)
GFR calc Af Amer: 64 mL/min/{1.73_m2} (ref >59)
GFR calc non Af Amer: 56 mL/min/{1.73_m2} — ABNORMAL LOW (ref >59)
Glucose: 98 mg/dL (ref 65–99)
Phosphorus: 3.5 mg/dL (ref 3.0–4.3)
Potassium: 3.5 mmol/L (ref 3.5–5.2)
Sodium: 145 mmol/L — ABNORMAL HIGH (ref 134–144)

## 2019-02-26 LAB — LIPID PANEL WITH LDL/HDL RATIO
Cholesterol, Total: 188 mg/dL (ref 100–199)
HDL: 88 mg/dL (ref >39)
LDL Chol Calc (NIH): 75 mg/dL (ref 0–99)
LDL/HDL Ratio: 0.9 ratio (ref 0.0–3.2)
Triglycerides: 154 mg/dL — ABNORMAL HIGH (ref 0–149)
VLDL Cholesterol Cal: 25 mg/dL (ref 5–40)

## 2019-03-08 DIAGNOSIS — E782 Mixed hyperlipidemia: Secondary | ICD-10-CM | POA: Diagnosis not present

## 2019-03-08 DIAGNOSIS — I10 Essential (primary) hypertension: Secondary | ICD-10-CM | POA: Diagnosis not present

## 2019-03-08 DIAGNOSIS — I420 Dilated cardiomyopathy: Secondary | ICD-10-CM | POA: Diagnosis not present

## 2019-03-08 DIAGNOSIS — Z6841 Body Mass Index (BMI) 40.0 and over, adult: Secondary | ICD-10-CM | POA: Diagnosis not present

## 2019-03-08 DIAGNOSIS — I480 Paroxysmal atrial fibrillation: Secondary | ICD-10-CM | POA: Diagnosis not present

## 2019-03-19 ENCOUNTER — Inpatient Hospital Stay: Payer: Medicare Other | Admitting: Oncology

## 2019-03-22 ENCOUNTER — Encounter: Payer: Self-pay | Admitting: Oncology

## 2019-03-22 ENCOUNTER — Other Ambulatory Visit: Payer: Self-pay

## 2019-03-22 ENCOUNTER — Inpatient Hospital Stay: Payer: Medicare Other | Attending: Oncology | Admitting: Oncology

## 2019-03-22 DIAGNOSIS — D0511 Intraductal carcinoma in situ of right breast: Secondary | ICD-10-CM | POA: Diagnosis not present

## 2019-03-22 DIAGNOSIS — M8580 Other specified disorders of bone density and structure, unspecified site: Secondary | ICD-10-CM

## 2019-03-22 DIAGNOSIS — Z79810 Long term (current) use of selective estrogen receptor modulators (SERMs): Secondary | ICD-10-CM

## 2019-03-22 MED ORDER — ALENDRONATE SODIUM 70 MG PO TABS: ORAL_TABLET | ORAL | 1 refills | Status: DC

## 2019-03-22 NOTE — Progress Notes (Signed)
Patient stated that she had been doing well with no complaints. 

## 2019-03-23 NOTE — Progress Notes (Signed)
I connected with Melissa Chase on 03/23/19 at  2:45 PM EST by video enabled telemedicine visit and verified that I am speaking with the correct person using two identifiers.   I discussed the limitations, risks, security and privacy concerns of performing an evaluation and management service by telemedicine and the availability of in-person appointments. I also discussed with the patient that there may be a patient responsible charge related to this service. The patient expressed understanding and agreed to proceed.  Other persons participating in the visit and their role in the encounter:  none  Patient's location:  home Provider's location:  work  Risk analyst Complaint: Routine follow-up of right breast DCIS on tamoxifen  History of present illness: Patient is a 79 year old female with a history of left breast DCIS in 2006. At that time she underwent a lumpectomy followed by radiation treatment and was on tamoxifen for 5 years. Subsequently she has been undergoing regular screening mammograms. She self palpated a mass in the right subareolar region. This was followed by a mammogram and ultrasound in May 2019 which revealed a solid 1.1 x 0.9 x 0.5 cm right breast mass. No evidence of right axillary adenopathy. 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 Consideration includes DCIS involving a papilloma and encapsulated papillary carcinoma. Patient underwent lumpectomy on 07/09/2017 by Dr. Peyton Najjar which showed a 12 mm grade 1-2 DCIS with involvement of pre-existing papilloma and adjacent ducts. Lateral excision revealed atypical ductal hyperplasia. Margins were negative. Tumor was greater than ER 90% positive and greater than 90% PR positive. Pathologic stage Tis NX. Patientcompleted adjuvant RT and was started on arimidex. She could not tolerate arimidex and was switched to tamoxifen   Interval history patient reports that her appetite and weight have  remained stable over the last 3 to 4 months.  Reports no difficulty swallowing.  Tolerating tamoxifen well without any significant side effects.   Review of Systems  Constitutional: Negative for chills, fever, malaise/fatigue and weight loss.  HENT: Negative for congestion, ear discharge and nosebleeds.   Eyes: Negative for blurred vision.  Respiratory: Negative for cough, hemoptysis, sputum production, shortness of breath and wheezing.   Cardiovascular: Negative for chest pain, palpitations, orthopnea and claudication.  Gastrointestinal: Negative for abdominal pain, blood in stool, constipation, diarrhea, heartburn, melena, nausea and vomiting.  Genitourinary: Negative for dysuria, flank pain, frequency, hematuria and urgency.  Musculoskeletal: Negative for back pain, joint pain and myalgias.  Skin: Negative for rash.  Neurological: Negative for dizziness, tingling, focal weakness, seizures, weakness and headaches.  Endo/Heme/Allergies: Does not bruise/bleed easily.  Psychiatric/Behavioral: Negative for depression and suicidal ideas. The patient does not have insomnia.     No Known Allergies  Past Medical History:  Diagnosis Date  . Afib ([place])   . Arthritis   . Breast cancer ([place]) 2006   left breast  . Breast cancer ([place]) 2019   right breast  . Depression   . GERD (gastroesophageal reflux disease)   . Hypertension   . Insomnia   . Insomnia   . Personal history of radiation therapy 2006   37 tx.  . Personal history of radiation therapy 2019    Past Surgical History:  Procedure Laterality Date  . BREAST LUMPECTOMY Right 07/09/2017  . BREAST LUMPECTOMY Left 2006  . BREAST LUMPECTOMY WITH NEEDLE LOCALIZATION Right 07/09/2017   Procedure: BREAST LUMPECTOMY WITH NEEDLE LOCALIZATION;  Surgeon: Herbert Pun, MD;  Location: [facility] ORS;  Service: General;  Laterality: Right;  . BREAST SURGERY  left breast lumpectomy  . CARDIOVERSION N/A 03/21/2016   Procedure:  Cardioversion;  Surgeon: Corey Skains, MD;  Location: [facility] ORS;  Service: Cardiovascular;  Laterality: N/A;  . CATARACT EXTRACTION, BILATERAL    . LAPAROSCOPIC CHOLECYSTECTOMY    . TONSILLECTOMY    . VAGINAL HYSTERECTOMY      Social History   Socioeconomic History  . Marital status: Widowed    Spouse name: Not on file  . Number of children: 3  . Years of education: Not on file  . Highest education level: Not on file  Occupational History  . Not on file  Tobacco Use  . Smoking status: Never Smoker  . Smokeless tobacco: Never Used  Substance and Sexual Activity  . Alcohol use: Never  . Drug use: No  . Sexual activity: Not Currently  Other Topics Concern  . Not on file  Social History Narrative  . Not on file   Social Determinants of Health   Financial Resource Strain: Low Risk   . Difficulty of Paying Living Expenses: Not hard at all  Food Insecurity: No Food Insecurity  . Worried About Charity fundraiser in the Last Year: Never true  . Ran Out of Food in the Last Year: Never true  Transportation Needs: No Transportation Needs  . Lack of Transportation (Medical): No  . Lack of Transportation (Non-Medical): No  Physical Activity: Inactive  . Days of Exercise per Week: 0 days  . Minutes of Exercise per Session: 0 min  Stress: Stress Concern Present  . Feeling of Stress : To some extent  Social Connections: Unknown  . Frequency of Communication with Friends and Family: Patient refused  . Frequency of Social Gatherings with Friends and Family: Patient refused  . Attends Religious Services: Patient refused  . Active Member of Clubs or Organizations: Patient refused  . Attends Archivist Meetings: Patient refused  . Marital Status: Widowed  Intimate Partner Violence: Not At Risk  . Fear of Current or Ex-Partner: No  . Emotionally Abused: No  . Physically Abused: No  . Sexually Abused: No    Family History  Problem Relation Age of Onset  . Diabetes  Sister   . Breast cancer Daughter      Current Outpatient Medications:  .  apixaban (ELIQUIS) 5 MG TABS tablet, Take 5 mg by mouth 2 (two) times daily., Disp: , Rfl:  .  calcium-vitamin D (OSCAL WITH D) 500-200 MG-UNIT TABS tablet, Take 1 tablet by mouth 2 (two) times daily. , Disp: , Rfl:  .  celecoxib (CELEBREX) 200 MG capsule, Take 200 mg by mouth daily., Disp: , Rfl:  .  hydrochlorothiazide (MICROZIDE) 12.5 MG capsule, Take 1 capsule (12.5 mg total) by mouth daily., Disp: 90 capsule, Rfl: 1 .  Melatonin 10 MG TABS, Take 20 mg by mouth at bedtime. , Disp: , Rfl:  .  metoprolol succinate (TOPROL-XL) 100 MG 24 hr tablet, Take 1 tablet (100 mg total) by mouth daily. Take with or immediately following a meal., Disp: 90 tablet, Rfl: 1 .  omeprazole (PRILOSEC) 40 MG capsule, Take 1 capsule (40 mg total) by mouth daily., Disp: 90 capsule, Rfl: 1 .  pravastatin (PRAVACHOL) 20 MG tablet, Take 1 tablet (20 mg total) by mouth daily., Disp: 90 tablet, Rfl: 1 .  sertraline (ZOLOFT) 100 MG tablet, Take 1 tablet (100 mg total) by mouth daily., Disp: 90 tablet, Rfl: 1 .  tamoxifen (NOLVADEX) 20 MG tablet, TAKE 1 TABLET BY MOUTH EVERY  DAY, Disp: 90 tablet, Rfl: 1 .  alendronate (FOSAMAX) 70 MG tablet, TAKE 1 TABLET BY MOUTH ONCE A WEEK. TAKE WITH A FULL GLASS OF WATER ON AN EMPTY STOMACH., Disp: 12 tablet, Rfl: 1  No results found.  No images are attached to the encounter.   CMP Latest Ref Rng & Units 02/25/2019  Glucose 65 - 99 mg/dL 98  BUN 8 - 27 mg/dL 15  Creatinine 0.57 - 1.00 mg/dL 0.98  Sodium 134 - 144 mmol/L 145(H)  Potassium 3.5 - 5.2 mmol/L 3.5  Chloride 96 - 106 mmol/L 104  CO2 20 - 29 mmol/L 28  Calcium 8.7 - 10.3 mg/dL 9.1  Total Protein 6.5 - 8.1 g/dL -  Total Bilirubin 0.3 - 1.2 mg/dL -  Alkaline Phos 38 - 126 U/L -  AST 15 - 41 U/L -  ALT 0 - 44 U/L -   CBC Latest Ref Rng & Units 09/03/2018  WBC 4.0 - 10.5 K/uL 6.0  Hemoglobin 12.0 - 15.0 g/dL 12.3  Hematocrit 36.0 - 46.0 %  36.7  Platelets 150 - 400 K/uL 163     Observation/objective: Appears in no acute distress on video visit today.  Breathing is nonlabored  Assessment and plan: Patient is a 79 year old female with right breast DCIS s/p lumpectomy currently on tamoxifen.  She is here for routine follow-up  Clinically patient is doing well and tolerating tamoxifen well without any significant side effects.  Denies any vaginal bleeding, hot flashes or significant joint pains.  Appetite and weight is stable.  She will be due for a bone density scan and mammogram in July 2021 which I will order.  Patient will continue to take tamoxifen for total 5 years.  Follow-up instructions: I will see her in 6 months for in person visit for breast exam with labs CBC with differential and CMP  I discussed the assessment and treatment plan with the patient. The patient was provided an opportunity to ask questions and all were answered. The patient agreed with the plan and demonstrated an understanding of the instructions.   The patient was advised to call back or seek an in-person evaluation if the symptoms worsen or if the condition fails to improve as anticipated.   Visit Diagnosis: 1. Ductal carcinoma in situ (DCIS) of right breast   2. Osteopenia, unspecified location   3. Long-term current use of tamoxifen     Dr. Randa Evens, MD, MPH [facility] [place] Tel- [phone] 03/23/2019 11:28 AM

## 2019-05-25 ENCOUNTER — Other Ambulatory Visit
Admission: RE | Admit: 2019-05-25 | Discharge: 2019-05-25 | Disposition: A | Discharge status: Home / Self Care | Payer: Medicare Other | Attending: Family Medicine | Admitting: Family Medicine

## 2019-05-25 ENCOUNTER — Encounter: Payer: Self-pay | Admitting: Family Medicine

## 2019-05-25 ENCOUNTER — Ambulatory Visit (INDEPENDENT_AMBULATORY_CARE_PROVIDER_SITE_OTHER): Payer: Medicare Other | Admitting: Family Medicine

## 2019-05-25 ENCOUNTER — Other Ambulatory Visit: Payer: Self-pay

## 2019-05-25 VITALS — BP 120/70 | HR 72 | Ht 66.0 in | Wt 234.0 lb

## 2019-05-25 DIAGNOSIS — R5383 Other fatigue: Secondary | ICD-10-CM | POA: Diagnosis not present

## 2019-05-25 DIAGNOSIS — F339 Major depressive disorder, recurrent, unspecified: Secondary | ICD-10-CM

## 2019-05-25 DIAGNOSIS — I420 Dilated cardiomyopathy: Secondary | ICD-10-CM | POA: Diagnosis not present

## 2019-05-25 DIAGNOSIS — I10 Essential (primary) hypertension: Secondary | ICD-10-CM

## 2019-05-25 DIAGNOSIS — K219 Gastro-esophageal reflux disease without esophagitis: Secondary | ICD-10-CM

## 2019-05-25 DIAGNOSIS — E785 Hyperlipidemia, unspecified: Secondary | ICD-10-CM

## 2019-05-25 MED ORDER — OMEPRAZOLE 40 MG PO CPDR: 40.0000 mg | DELAYED_RELEASE_CAPSULE | Freq: Every day | ORAL | 1 refills | Status: DC

## 2019-05-25 MED ORDER — SERTRALINE HCL 100 MG PO TABS: 100.0000 mg | ORAL_TABLET | Freq: Every day | ORAL | 1 refills | Status: DC

## 2019-05-25 MED ORDER — METOPROLOL SUCCINATE ER 100 MG PO TB24: 100.0000 mg | ORAL_TABLET | Freq: Every day | ORAL | 1 refills | Status: DC

## 2019-05-25 MED ORDER — HYDROCHLOROTHIAZIDE 12.5 MG PO CAPS: 12.5000 mg | ORAL_CAPSULE | Freq: Every day | ORAL | 1 refills | Status: DC

## 2019-05-25 MED ORDER — PRAVASTATIN SODIUM 20 MG PO TABS: 20.0000 mg | ORAL_TABLET | Freq: Every day | ORAL | 1 refills | Status: DC

## 2019-05-25 MED ORDER — LISINOPRIL 5 MG PO TABS: 5.0000 mg | ORAL_TABLET | Freq: Every day | ORAL | 1 refills | Status: DC

## 2019-05-25 NOTE — Progress Notes (Signed)
Date:  05/25/2019   Name:  Melissa Chase   DOB:  01/11/1941   MRN:  [id number]   Chief Complaint: Hyperlipidemia, Gastroesophageal Reflux, Hypertension, and Depression (PHQ9=0 a GAD7=0)  Hyperlipidemia This is a chronic problem. The current episode started more than 1 year ago. The problem is controlled. Recent lipid tests were reviewed and are normal. She has no history of chronic renal disease, diabetes, hypothyroidism, liver disease, obesity or nephrotic syndrome. Factors aggravating her hyperlipidemia include beta blockers. Pertinent negatives include no chest pain, focal sensory loss, focal weakness, leg pain, myalgias or shortness of breath. Current antihyperlipidemic treatment includes statins. The current treatment provides moderate improvement of lipids. There are no compliance problems.  Risk factors for coronary artery disease include dyslipidemia, hypertension and obesity.  Gastroesophageal Reflux She reports no abdominal pain, no belching, no chest pain, no choking, no coughing, no dysphagia, no early satiety, no globus sensation, no heartburn, no nausea, no sore throat, no stridor, no tooth decay, no water brash or no wheezing. This is a recurrent problem. The current episode started more than 1 year ago. The problem occurs occasionally. The problem has been gradually improving. The symptoms are aggravated by certain foods. Pertinent negatives include no anemia, fatigue, melena, muscle weakness, orthopnea or weight loss. She has tried a PPI for the symptoms. The treatment provided moderate relief.  Hypertension This is a new problem. The current episode started more than 1 year ago. The problem has been gradually improving since onset. The problem is controlled. Pertinent negatives include no anxiety, blurred vision, chest pain, headaches, malaise/fatigue, neck pain, orthopnea, palpitations, peripheral edema, PND, shortness of breath or sweats. There are no associated agents to  hypertension. There are no known risk factors for coronary artery disease. Past treatments include diuretics and beta blockers. The current treatment provides moderate improvement. There is no history of angina, kidney disease, CAD/MI, CVA, heart failure, left ventricular hypertrophy, PVD or retinopathy. There is no history of chronic renal disease, a hypertension causing med or renovascular disease.  Depression        Associated symptoms include no fatigue, no myalgias and no headaches.   Pertinent negatives include no hypothyroidism and no anxiety.   Lab Results  Component Value Date   CREATININE 0.98 02/25/2019   BUN 15 02/25/2019   NA 145 (H) 02/25/2019   K 3.5 02/25/2019   CL 104 02/25/2019   CO2 28 02/25/2019   Lab Results  Component Value Date   CHOL 188 02/25/2019   HDL 88 02/25/2019   LDLCALC 75 02/25/2019   TRIG 154 (H) 02/25/2019   CHOLHDL 3.8 07/30/2016   Lab Results  Component Value Date   TSH 2.210 09/03/2018   Lab Results  Component Value Date   HGBA1C 5.4 01/01/2013   Lab Results  Component Value Date   WBC 6.0 09/03/2018   HGB 12.3 09/03/2018   HCT 36.7 09/03/2018   MCV 90.2 09/03/2018   PLT 163 09/03/2018   Lab Results  Component Value Date   ALT 17 09/03/2018   AST 25 09/03/2018   ALKPHOS 39 09/03/2018   BILITOT 0.6 09/03/2018     Review of Systems  Constitutional: Negative.  Negative for chills, fatigue, fever, malaise/fatigue, unexpected weight change and weight loss.  HENT: Negative for congestion, ear discharge, ear pain, rhinorrhea, sinus pressure, sneezing and sore throat.   Eyes: Negative for blurred vision, photophobia, pain, discharge, redness and itching.  Respiratory: Negative for cough, choking, shortness of breath, wheezing  and stridor.   Cardiovascular: Negative for chest pain, palpitations, orthopnea and PND.  Gastrointestinal: Negative for abdominal pain, blood in stool, constipation, diarrhea, dysphagia, heartburn, melena,  nausea and vomiting.  Endocrine: Negative for cold intolerance, heat intolerance, polydipsia, polyphagia and polyuria.  Genitourinary: Negative for dysuria, flank pain, frequency, hematuria, menstrual problem, pelvic pain, urgency, vaginal bleeding and vaginal discharge.  Musculoskeletal: Negative for arthralgias, back pain, myalgias, muscle weakness and neck pain.  Skin: Negative for rash.  Allergic/Immunologic: Negative for environmental allergies and food allergies.  Neurological: Negative for dizziness, focal weakness, weakness, light-headedness, numbness and headaches.  Hematological: Negative for adenopathy. Does not bruise/bleed easily.  Psychiatric/Behavioral: Positive for depression. Negative for dysphoric mood. The patient is not nervous/anxious.     Patient Active Problem List   Diagnosis Date Noted  . Obesity (BMI 30-39.9) 10/26/2018  . 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 Goals of care, counseling/discussion 09/18/2017  . Sacroiliitis ([place]) 02/04/2017  . Dilated cardiomyopathy ([place]) 05/02/2016  . Bradycardia 04/04/2016  . A-fib ([place]) 03/20/2016  . Acute dyspnea 02/20/2016  . 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 Arthritis 06/06/2014  . Aggrieved 06/06/2014  . Dermatitis, eczematoid 06/06/2014  . Recurrent major depressive episodes ([place]) 06/06/2014  . Essential (primary) hypertension 06/06/2014  . Gastro-esophageal reflux disease without esophagitis 06/06/2014  . Cannot sleep 06/06/2014  . Menopause 06/06/2014    No Known Allergies  Past Surgical History:  Procedure Laterality Date  . BREAST LUMPECTOMY Right 07/09/2017  . BREAST LUMPECTOMY Left 2006  . BREAST LUMPECTOMY WITH NEEDLE LOCALIZATION Right 07/09/2017   Procedure: BREAST LUMPECTOMY WITH NEEDLE LOCALIZATION;  Surgeon: Herbert Pun, MD;  Location: [facility] ORS;  Service: General;  Laterality: Right;  . BREAST  SURGERY     left breast lumpectomy  . CARDIOVERSION N/A 03/21/2016   Procedure: Cardioversion;  Surgeon: Corey Skains, MD;  Location: [facility] ORS;  Service: Cardiovascular;  Laterality: N/A;  . CATARACT EXTRACTION, BILATERAL    . LAPAROSCOPIC CHOLECYSTECTOMY    . TONSILLECTOMY    . VAGINAL HYSTERECTOMY      Social History   Tobacco Use  . Smoking status: Never Smoker  . Smokeless tobacco: Never Used  Substance Use Topics  . Alcohol use: Never  . Drug use: No     Medication list has been reviewed and updated.  Current Meds  Medication Sig  . alendronate (FOSAMAX) 70 MG tablet TAKE 1 TABLET BY MOUTH ONCE A WEEK. TAKE WITH A FULL GLASS OF WATER ON AN EMPTY STOMACH.  Marland Kitchen apixaban (ELIQUIS) 5 MG TABS tablet Take 5 mg by mouth 2 (two) times daily. cardio  . calcium-vitamin D (OSCAL WITH D) 500-200 MG-UNIT TABS tablet Take 1 tablet by mouth 2 (two) times daily.   . celecoxib (CELEBREX) 200 MG capsule Take 200 mg by mouth daily.  . hydrochlorothiazide (MICROZIDE) 12.5 MG capsule Take 1 capsule (12.5 mg total) by mouth daily.  . Melatonin 10 MG TABS Take 20 mg by mouth at bedtime.   . metoprolol succinate (TOPROL-XL) 100 MG 24 hr tablet Take 1 tablet (100 mg total) by mouth daily. Take with or immediately following a meal.  . omeprazole (PRILOSEC) 40 MG capsule Take 1 capsule (40 mg total) by mouth daily.  . pravastatin (PRAVACHOL) 20 MG tablet Take 1 tablet (20 mg total) by mouth daily.  . sertraline (ZOLOFT) 100 MG tablet Take 1 tablet (100 mg total) by mouth daily.  . tamoxifen (NOLVADEX) 20  MG tablet TAKE 1 TABLET BY MOUTH EVERY DAY    PHQ 2/9 Scores 05/25/2019 02/25/2019 11/30/2018 11/18/2018  PHQ - 2 Score 0 0 1 2  PHQ- 9 Score 0 0 2 9    BP Readings from Last 3 Encounters:  05/25/19 120/70  02/25/19 138/72  11/30/18 120/70    Physical Exam Vitals and nursing note reviewed.  Constitutional:      General: She is not in acute distress.    Appearance: She is obese. She is not  diaphoretic.  HENT:     Head: Normocephalic and atraumatic.     Right Ear: Tympanic membrane, ear canal and external ear normal.     Left Ear: Tympanic membrane, ear canal and external ear normal.     Nose: Nose normal.  Eyes:     General:        Right eye: No discharge.        Left eye: No discharge.     Conjunctiva/sclera: Conjunctivae normal.     Pupils: Pupils are equal, round, and reactive to light.  Neck:     Thyroid: No thyromegaly.     Vascular: No JVD.  Cardiovascular:     Rate and Rhythm: Normal rate and regular rhythm.     Pulses: Normal pulses.     Heart sounds: Normal heart sounds. No murmur. No friction rub. No gallop.   Pulmonary:     Effort: Pulmonary effort is normal.     Breath sounds: Normal breath sounds. No wheezing or rhonchi.  Abdominal:     General: Bowel sounds are normal.     Palpations: Abdomen is soft. There is no mass.     Tenderness: There is no abdominal tenderness. There is no guarding.  Musculoskeletal:        General: Normal range of motion.     Cervical back: Normal range of motion and neck supple.  Lymphadenopathy:     Cervical: No cervical adenopathy.  Skin:    General: Skin is warm and dry.  Neurological:     Mental Status: She is alert.     Deep Tendon Reflexes: Reflexes are normal and symmetric.     Wt Readings from Last 3 Encounters:  05/25/19 234 lb (106.1 kg)  02/25/19 229 lb (103.9 kg)  11/30/18 229 lb (103.9 kg)    BP 120/70   Pulse 72   Ht 5\' 6"  (1.676 m)   Wt 234 lb (106.1 kg)   BMI 37.77 kg/m   Assessment and Plan:  1. Essential hypertension Chronic.  Controlled.  Stable.  Continue hydrochlorothiazide 12.5 mg once a day, metoprolol XL 100 mg once a day and lisinopril 5 mg once a day.  Will check renal function panel. - hydrochlorothiazide (MICROZIDE) 12.5 MG capsule; Take 1 capsule (12.5 mg total) by mouth daily.  Dispense: 90 capsule; Refill: 1 - metoprolol succinate (TOPROL-XL) 100 MG 24 hr tablet; Take 1  tablet (100 mg total) by mouth daily. Take with or immediately following a meal.  Dispense: 90 tablet; Refill: 1 - Renal Function Panel - lisinopril (ZESTRIL) 5 MG tablet; Take 1 tablet (5 mg total) by mouth daily.  Dispense: 90 tablet; Refill: 1  2. Gastroesophageal reflux disease without esophagitis Chronic.  Controlled.  Stable.  Continue omeprazole 40 mg once a day. - omeprazole (PRILOSEC) 40 MG capsule; Take 1 capsule (40 mg total) by mouth daily.  Dispense: 90 capsule; Refill: 1  3. Dyslipidemia Chronic.  Controlled.  Stable.  Continue pravastatin 20 mg  once a day.  Will check lipid panel to evaluate - pravastatin (PRAVACHOL) 20 MG tablet; Take 1 tablet (20 mg total) by mouth daily.  Dispense: 90 tablet; Refill: 1 - Lipid Panel With LDL/HDL Ratio  4. Recurrent major depressive episodes (HCC) Chronic.  Controlled.  Stable.   PHQ 0 and gad score 0 continue sertraline 100 mg once a day. - sertraline (ZOLOFT) 100 MG tablet; Take 1 tablet (100 mg total) by mouth daily.  Dispense: 90 tablet; Refill: 1  5. Dilated cardiomyopathy [facility]) Patient is followed by Dr. Nehemiah Massed cardiology.  Patient was noted to be in the note supposedly on hydrochlorothiazide metoprolol and lisinopril but there was no lisinopril noted in her medication list.  Patient does have a history of A. fib for which she is also being followed for rate reduction and cerebrovascular embolism prophylaxis with Eliquis.  Patient's been encouraged to continue with her follow-ups and continue home medications and we will resume her lisinopril 5 mg once a day. - hydrochlorothiazide (MICROZIDE) 12.5 MG capsule; Take 1 capsule (12.5 mg total) by mouth daily.  Dispense: 90 capsule; Refill: 1 - metoprolol succinate (TOPROL-XL) 100 MG 24 hr tablet; Take 1 tablet (100 mg total) by mouth daily. Take with or immediately following a meal.  Dispense: 90 tablet; Refill: 1 - lisinopril (ZESTRIL) 5 MG tablet; Take 1 tablet (5 mg total) by mouth daily.   Dispense: 90 tablet; Refill: 1  6. Other fatigue Patient has been fatigued and it was noted that her dilated cardiomyopathy is resulted in a 45% reduction of ejection fraction.  This may be due to the patient currently not on lisinopril review of the blood pressure notes that it looks like patient should be able to tolerate 5 mg of lisinopril and this will be resumed to see if this may improve her fatigue and and exercise tolerance.

## 2019-05-26 ENCOUNTER — Other Ambulatory Visit
Admission: RE | Admit: 2019-05-26 | Discharge: 2019-05-26 | Disposition: A | Discharge status: Home / Self Care | Payer: Medicare Other | Attending: Family Medicine | Admitting: Family Medicine

## 2019-05-26 DIAGNOSIS — E785 Hyperlipidemia, unspecified: Secondary | ICD-10-CM | POA: Diagnosis not present

## 2019-05-26 DIAGNOSIS — I10 Essential (primary) hypertension: Secondary | ICD-10-CM | POA: Diagnosis not present

## 2019-05-26 LAB — RENAL FUNCTION PANEL
Albumin: 3.6 g/dL (ref 3.5–5.0)
Anion gap: 6 (ref 5–15)
BUN: 13 mg/dL (ref 8–23)
CO2: 26 mmol/L (ref 22–32)
Calcium: 8.6 mg/dL — ABNORMAL LOW (ref 8.9–10.3)
Chloride: 110 mmol/L (ref 98–111)
Creatinine, Ser: 0.86 mg/dL (ref 0.44–1.00)
GFR calc Af Amer: >60 mL/min (ref >60)
GFR calc non Af Amer: >60 mL/min (ref >60)
Glucose, Bld: 89 mg/dL (ref 70–99)
Phosphorus: 2.8 mg/dL (ref 2.5–4.6)
Potassium: 4.6 mmol/L (ref 3.5–5.1)
Sodium: 142 mmol/L (ref 135–145)

## 2019-05-26 LAB — LIPID PANEL
Cholesterol: 193 mg/dL (ref 0–200)
HDL: 84 mg/dL (ref >40)
LDL Cholesterol: 90 mg/dL (ref 0–99)
Total CHOL/HDL Ratio: 2.3 RATIO
Triglycerides: 94 mg/dL (ref <150)
VLDL: 19 mg/dL (ref 0–40)

## 2019-06-30 DIAGNOSIS — H04123 Dry eye syndrome of bilateral lacrimal glands: Secondary | ICD-10-CM | POA: Diagnosis not present

## 2019-06-30 DIAGNOSIS — H524 Presbyopia: Secondary | ICD-10-CM | POA: Diagnosis not present

## 2019-07-25 ENCOUNTER — Other Ambulatory Visit: Payer: Self-pay | Admitting: Oncology

## 2019-08-09 ENCOUNTER — Other Ambulatory Visit: Payer: Medicare Other

## 2019-08-19 ENCOUNTER — Other Ambulatory Visit: Payer: Self-pay

## 2019-08-19 ENCOUNTER — Ambulatory Visit (INDEPENDENT_AMBULATORY_CARE_PROVIDER_SITE_OTHER): Payer: Medicare Other | Admitting: Family Medicine

## 2019-08-19 ENCOUNTER — Encounter: Payer: Self-pay | Admitting: Family Medicine

## 2019-08-19 VITALS — BP 122/70 | HR 60 | Ht 66.0 in | Wt 228.0 lb

## 2019-08-19 DIAGNOSIS — J0100 Acute maxillary sinusitis, unspecified: Secondary | ICD-10-CM | POA: Diagnosis not present

## 2019-08-19 MED ORDER — AMOXICILLIN 500 MG PO CAPS: 500.0000 mg | ORAL_CAPSULE | Freq: Three times a day (TID) | ORAL | 0 refills | Status: DC

## 2019-08-19 MED ORDER — FLUTICASONE PROPIONATE 50 MCG/ACT NA SUSP: 2.0000 | Freq: Every day | NASAL | 6 refills | Status: DC

## 2019-08-19 NOTE — Progress Notes (Signed)
Date:  08/19/2019   Name:  Melissa Chase   DOB:  1940/02/22   MRN:  [id number]   Chief Complaint: Sinusitis (clear production, cough tickle in back of throat- taking mucinex for 3 days)  Sinusitis This is a new problem. The current episode started 1 to 4 weeks ago. The problem has been gradually worsening since onset. There has been no fever. The pain is moderate. Associated symptoms include congestion, coughing, sinus pressure and a sore throat. Pertinent negatives include no chills, diaphoresis, ear pain, headaches, hoarse voice, neck pain, shortness of breath, sneezing or swollen glands. Past treatments include oral decongestants. The treatment provided moderate relief.    Lab Results  Component Value Date   CREATININE 0.86 05/26/2019   BUN 13 05/26/2019   NA 142 05/26/2019   K 4.6 05/26/2019   CL 110 05/26/2019   CO2 26 05/26/2019   Lab Results  Component Value Date   CHOL 193 05/26/2019   HDL 84 05/26/2019   LDLCALC 90 05/26/2019   TRIG 94 05/26/2019   CHOLHDL 2.3 05/26/2019   Lab Results  Component Value Date   TSH 2.210 09/03/2018   Lab Results  Component Value Date   HGBA1C 5.4 01/01/2013   Lab Results  Component Value Date   WBC 6.0 09/03/2018   HGB 12.3 09/03/2018   HCT 36.7 09/03/2018   MCV 90.2 09/03/2018   PLT 163 09/03/2018   Lab Results  Component Value Date   ALT 17 09/03/2018   AST 25 09/03/2018   ALKPHOS 39 09/03/2018   BILITOT 0.6 09/03/2018     Review of Systems  Constitutional: Negative.  Negative for chills, diaphoresis, fatigue, fever and unexpected weight change.  HENT: Positive for congestion, sinus pressure and sore throat. Negative for ear discharge, ear pain, hoarse voice, rhinorrhea and sneezing.   Eyes: Negative for photophobia, pain, discharge, redness and itching.  Respiratory: Positive for cough. Negative for shortness of breath, wheezing and stridor.   Gastrointestinal: Negative for abdominal pain, blood in stool,  constipation, diarrhea, nausea and vomiting.  Endocrine: Negative for cold intolerance, heat intolerance, polydipsia, polyphagia and polyuria.  Genitourinary: Negative for dysuria, flank pain, frequency, hematuria, menstrual problem, pelvic pain, urgency, vaginal bleeding and vaginal discharge.  Musculoskeletal: Negative for arthralgias, back pain, myalgias and neck pain.  Skin: Negative for rash.  Allergic/Immunologic: Negative for environmental allergies and food allergies.  Neurological: Negative for dizziness, weakness, light-headedness, numbness and headaches.  Hematological: Negative for adenopathy. Does not bruise/bleed easily.  Psychiatric/Behavioral: Negative for dysphoric mood. The patient is not nervous/anxious.     Patient Active Problem List   Diagnosis Date Noted  . Obesity (BMI 30-39.9) 10/26/2018  . 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 Goals of care, counseling/discussion 09/18/2017  . Sacroiliitis ([place]) 02/04/2017  . Dilated cardiomyopathy ([place]) 05/02/2016  . Bradycardia 04/04/2016  . A-fib ([place]) 03/20/2016  . Acute dyspnea 02/20/2016  . 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 Arthritis 06/06/2014  . Aggrieved 06/06/2014  . Dermatitis, eczematoid 06/06/2014  . Recurrent major depressive episodes ([place]) 06/06/2014  . Essential (primary) hypertension 06/06/2014  . Gastro-esophageal reflux disease without esophagitis 06/06/2014  . Cannot sleep 06/06/2014  . Menopause 06/06/2014    No Known Allergies  Past Surgical History:  Procedure Laterality Date  . BREAST LUMPECTOMY Right 07/09/2017  . BREAST LUMPECTOMY Left 2006  . BREAST LUMPECTOMY WITH NEEDLE LOCALIZATION Right 07/09/2017   Procedure: BREAST LUMPECTOMY WITH  NEEDLE LOCALIZATION;  Surgeon: Herbert Pun, MD;  Location: [facility] ORS;  Service: General;  Laterality: Right;  . BREAST SURGERY     left breast lumpectomy  .  CARDIOVERSION N/A 03/21/2016   Procedure: Cardioversion;  Surgeon: Corey Skains, MD;  Location: [facility] ORS;  Service: Cardiovascular;  Laterality: N/A;  . CATARACT EXTRACTION, BILATERAL    . LAPAROSCOPIC CHOLECYSTECTOMY    . TONSILLECTOMY    . VAGINAL HYSTERECTOMY      Social History   Tobacco Use  . Smoking status: Never Smoker  . Smokeless tobacco: Never Used  Vaping Use  . Vaping Use: Never used  Substance Use Topics  . Alcohol use: Never  . Drug use: No     Medication list has been reviewed and updated.  Current Meds  Medication Sig  . alendronate (FOSAMAX) 70 MG tablet TAKE 1 TABLET BY MOUTH ONCE A WEEK. TAKE WITH A FULL GLASS OF WATER ON AN EMPTY STOMACH.  Marland Kitchen apixaban (ELIQUIS) 5 MG TABS tablet Take 5 mg by mouth 2 (two) times daily. cardio  . calcium-vitamin D (OSCAL WITH D) 500-200 MG-UNIT TABS tablet Take 1 tablet by mouth 2 (two) times daily.   . celecoxib (CELEBREX) 200 MG capsule Take 200 mg by mouth daily.  . hydrochlorothiazide (MICROZIDE) 12.5 MG capsule Take 1 capsule (12.5 mg total) by mouth daily.  Marland Kitchen lisinopril (ZESTRIL) 5 MG tablet Take 1 tablet (5 mg total) by mouth daily.  . Melatonin 10 MG TABS Take 20 mg by mouth at bedtime.   . metoprolol succinate (TOPROL-XL) 100 MG 24 hr tablet Take 1 tablet (100 mg total) by mouth daily. Take with or immediately following a meal.  . omeprazole (PRILOSEC) 40 MG capsule Take 1 capsule (40 mg total) by mouth daily.  . pravastatin (PRAVACHOL) 20 MG tablet Take 1 tablet (20 mg total) by mouth daily.  . sertraline (ZOLOFT) 100 MG tablet Take 1 tablet (100 mg total) by mouth daily.  . tamoxifen (NOLVADEX) 20 MG tablet TAKE 1 TABLET BY MOUTH EVERY DAY    PHQ 2/9 Scores 08/19/2019 05/25/2019 02/25/2019 11/30/2018  PHQ - 2 Score 0 0 0 1  PHQ- 9 Score 0 0 0 2    GAD 7 : Generalized Anxiety Score 08/19/2019 05/25/2019 02/25/2019 11/30/2018  Nervous, Anxious, on Edge 0 0 1 0  Control/stop worrying 0 0 0 0  Worry too much - different  things 0 0 0 0  Trouble relaxing 0 0 0 0  Restless 0 0 0 0  Easily annoyed or irritable 0 0 0 2  Afraid - awful might happen 0 0 0 0  Total GAD 7 Score 0 0 1 2  Anxiety Difficulty - - Not difficult at all Somewhat difficult    BP Readings from Last 3 Encounters:  08/19/19 122/70  05/25/19 120/70  02/25/19 138/72    Physical Exam Vitals reviewed.  Constitutional:      Appearance: She is well-developed.  HENT:     Head: Normocephalic.     Right Ear: Tympanic membrane, ear canal and external ear normal. There is no impacted cerumen.     Left Ear: Tympanic membrane, ear canal and external ear normal. There is no impacted cerumen.     Nose: Nose normal. No congestion or rhinorrhea.     Mouth/Throat:     Mouth: Mucous membranes are moist.  Eyes:     General: Lids are everted, no foreign bodies appreciated. No scleral icterus.  Left eye: No foreign body or hordeolum.     Conjunctiva/sclera: Conjunctivae normal.     Right eye: Right conjunctiva is not injected.     Left eye: Left conjunctiva is not injected.     Pupils: Pupils are equal, round, and reactive to light.  Neck:     Thyroid: No thyromegaly.     Vascular: No JVD.     Trachea: No tracheal deviation.  Cardiovascular:     Rate and Rhythm: Normal rate and regular rhythm.     Heart sounds: Normal heart sounds. No murmur heard.  No friction rub. No gallop.   Pulmonary:     Effort: Pulmonary effort is normal. No respiratory distress.     Breath sounds: Normal breath sounds. No wheezing, rhonchi or rales.  Abdominal:     General: Bowel sounds are normal.     Palpations: Abdomen is soft. There is no mass.     Tenderness: There is no abdominal tenderness. There is no guarding or rebound.  Musculoskeletal:        General: No tenderness. Normal range of motion.     Cervical back: Normal range of motion and neck supple.  Lymphadenopathy:     Cervical: No cervical adenopathy.  Skin:    General: Skin is warm.      Coloration: Skin is not pale.     Findings: No rash.  Neurological:     Mental Status: She is alert and oriented to person, place, and time.     Cranial Nerves: No cranial nerve deficit.     Deep Tendon Reflexes: Reflexes normal.  Psychiatric:        Mood and Affect: Mood is not anxious or depressed.     Wt Readings from Last 3 Encounters:  08/19/19 (!) 228 lb (103.4 kg)  05/25/19 234 lb (106.1 kg)  02/25/19 229 lb (103.9 kg)    BP 122/70   Pulse 60   Ht 5\' 6"  (1.676 m)   Wt (!) 228 lb (103.4 kg)   BMI 36.80 kg/m   Assessment and Plan:  1. Acute non-recurrent maxillary sinusitis Acute.  Persistent.  Relatively stable but remains symptomatic.  History and exam is consistent with a acute maxillary sinusitis.  Will initiate amoxicillin 500 mg 3 times a day for 10 days as well as continuance of fluticasone nasal spray. - amoxicillin (AMOXIL) 500 MG capsule; Take 1 capsule (500 mg total) by mouth 3 (three) times daily.  Dispense: 30 capsule; Refill: 0 - fluticasone (FLONASE) 50 MCG/ACT nasal spray; Place 2 sprays into both nostrils daily.  Dispense: 16 g; Refill: 6

## 2019-08-26 ENCOUNTER — Other Ambulatory Visit: Payer: Medicare Other

## 2019-09-06 ENCOUNTER — Telehealth: Payer: Self-pay

## 2019-09-06 MED ORDER — AZITHROMYCIN 250 MG PO TABS
ORAL_TABLET | ORAL | 0 refills | Status: DC
Start: 2019-09-06 — End: 2019-10-21

## 2019-09-06 NOTE — Telephone Encounter (Signed)
Patient called asking to speak to Dr Ronnald Ramp nurse. Baxter Flattery is out of the office - I am working for Dr Ronnald Ramp. Called and left pt a Vm to call back and leave a detailed msg with the PEC so I can assist her quicker.   CM

## 2019-09-06 NOTE — Telephone Encounter (Signed)
Copied from [place] [id number]. Topic: General - Inquiry >> Sep 06, 2019 11:07 AM Alease Frame wrote: Reason for CRM: Pt is requesting a call back from Gunnar Bulla . Pt didn't give reason . Please advise

## 2019-09-06 NOTE — Telephone Encounter (Signed)
Patient returning call, she states both of her ears are clogged, has a runny nose. She states the medication amox prescribed has not seemed to improve symptoms. She would like a call back with advice.

## 2019-09-06 NOTE — Telephone Encounter (Signed)
Called and spoke with pt. She said she was doing better on amoxicillin TID but now her symptoms are back with stuffiness in her nose, eye running, and nose running.   Told her to continue to take her flonase twice daily and we will call in a Zpack. It may not do any good tho. Explained this pollen season could be the cause of her congestion and most likely is not infection after amoxicillin.  Sent Zpack and told patient this is good for 10 days.   CM

## 2019-09-06 NOTE — Telephone Encounter (Signed)
Not a patient at our practice, can you please review. KW

## 2019-09-06 NOTE — Telephone Encounter (Signed)
ERROR

## 2019-09-08 DIAGNOSIS — I071 Rheumatic tricuspid insufficiency: Secondary | ICD-10-CM | POA: Diagnosis not present

## 2019-09-08 DIAGNOSIS — I420 Dilated cardiomyopathy: Secondary | ICD-10-CM | POA: Diagnosis not present

## 2019-09-08 DIAGNOSIS — I480 Paroxysmal atrial fibrillation: Secondary | ICD-10-CM | POA: Diagnosis not present

## 2019-09-08 DIAGNOSIS — E782 Mixed hyperlipidemia: Secondary | ICD-10-CM | POA: Diagnosis not present

## 2019-09-08 DIAGNOSIS — I340 Nonrheumatic mitral (valve) insufficiency: Secondary | ICD-10-CM | POA: Diagnosis not present

## 2019-09-08 DIAGNOSIS — I10 Essential (primary) hypertension: Secondary | ICD-10-CM | POA: Diagnosis not present

## 2019-09-08 NOTE — Telephone Encounter (Signed)
Spoke with the patient and sent in [place] but told her it could be allergies from the pollen and Zpack may not help. Told her the Zpack is good for 10 days.  CM

## 2019-09-23 ENCOUNTER — Other Ambulatory Visit: Payer: Self-pay

## 2019-09-23 ENCOUNTER — Encounter: Payer: Self-pay | Admitting: Oncology

## 2019-09-23 ENCOUNTER — Encounter (INDEPENDENT_AMBULATORY_CARE_PROVIDER_SITE_OTHER): Payer: Self-pay

## 2019-09-23 ENCOUNTER — Inpatient Hospital Stay: Payer: Medicare Other | Attending: Oncology

## 2019-09-23 ENCOUNTER — Inpatient Hospital Stay (HOSPITAL_BASED_OUTPATIENT_CLINIC_OR_DEPARTMENT_OTHER): Payer: Medicare Other | Admitting: Oncology

## 2019-09-23 VITALS — BP 112/65 | HR 66 | Temp 98.0°F | Resp 18 | Wt 225.6 lb

## 2019-09-23 DIAGNOSIS — I10 Essential (primary) hypertension: Secondary | ICD-10-CM | POA: Diagnosis not present

## 2019-09-23 DIAGNOSIS — Z79899 Other long term (current) drug therapy: Secondary | ICD-10-CM | POA: Diagnosis not present

## 2019-09-23 DIAGNOSIS — I4891 Unspecified atrial fibrillation: Secondary | ICD-10-CM | POA: Insufficient documentation

## 2019-09-23 DIAGNOSIS — Z5181 Encounter for therapeutic drug level monitoring: Secondary | ICD-10-CM | POA: Diagnosis not present

## 2019-09-23 DIAGNOSIS — D0511 Intraductal carcinoma in situ of right breast: Secondary | ICD-10-CM | POA: Diagnosis not present

## 2019-09-23 DIAGNOSIS — Z79810 Long term (current) use of selective estrogen receptor modulators (SERMs): Secondary | ICD-10-CM

## 2019-09-23 DIAGNOSIS — Z08 Encounter for follow-up examination after completed treatment for malignant neoplasm: Secondary | ICD-10-CM

## 2019-09-23 DIAGNOSIS — Z7901 Long term (current) use of anticoagulants: Secondary | ICD-10-CM | POA: Diagnosis not present

## 2019-09-23 DIAGNOSIS — Z853 Personal history of malignant neoplasm of breast: Secondary | ICD-10-CM

## 2019-09-23 DIAGNOSIS — Z170 Estrogen receptor positive status [ER+]: Secondary | ICD-10-CM | POA: Diagnosis not present

## 2019-09-23 DIAGNOSIS — F329 Major depressive disorder, single episode, unspecified: Secondary | ICD-10-CM | POA: Insufficient documentation

## 2019-09-23 LAB — COMPREHENSIVE METABOLIC PANEL
ALT: 16 U/L (ref 0–44)
AST: 26 U/L (ref 15–41)
Albumin: 3.9 g/dL (ref 3.5–5.0)
Alkaline Phosphatase: 29 U/L — ABNORMAL LOW (ref 38–126)
Anion gap: 10 (ref 5–15)
BUN: 16 mg/dL (ref 8–23)
CO2: 28 mmol/L (ref 22–32)
Calcium: 8.8 mg/dL — ABNORMAL LOW (ref 8.9–10.3)
Chloride: 106 mmol/L (ref 98–111)
Creatinine, Ser: 0.91 mg/dL (ref 0.44–1.00)
GFR calc Af Amer: >60 mL/min (ref >60)
GFR calc non Af Amer: >60 mL/min (ref >60)
Glucose, Bld: 128 mg/dL — ABNORMAL HIGH (ref 70–99)
Potassium: 4 mmol/L (ref 3.5–5.1)
Sodium: 144 mmol/L (ref 135–145)
Total Bilirubin: 0.6 mg/dL (ref 0.3–1.2)
Total Protein: 6.7 g/dL (ref 6.5–8.1)

## 2019-09-23 LAB — CBC WITH DIFFERENTIAL/PLATELET
Abs Immature Granulocytes: 0.01 10*3/uL (ref 0.00–0.07)
Basophils Absolute: 0 10*3/uL (ref 0.0–0.1)
Basophils Relative: 1 %
Eosinophils Absolute: 0.1 10*3/uL (ref 0.0–0.5)
Eosinophils Relative: 3 %
HCT: 38.3 % (ref 36.0–46.0)
Hemoglobin: 13 g/dL (ref 12.0–15.0)
Immature Granulocytes: 0 %
Lymphocytes Relative: 32 %
Lymphs Abs: 1.5 10*3/uL (ref 0.7–4.0)
MCH: 31.3 pg (ref 26.0–34.0)
MCHC: 33.9 g/dL (ref 30.0–36.0)
MCV: 92.1 fL (ref 80.0–100.0)
Monocytes Absolute: 0.4 10*3/uL (ref 0.1–1.0)
Monocytes Relative: 9 %
Neutro Abs: 2.5 10*3/uL (ref 1.7–7.7)
Neutrophils Relative %: 55 %
Platelets: 141 10*3/uL — ABNORMAL LOW (ref 150–400)
RBC: 4.16 MIL/uL (ref 3.87–5.11)
RDW: 12.6 % (ref 11.5–15.5)
WBC: 4.5 10*3/uL (ref 4.0–10.5)
nRBC: 0 % (ref 0.0–0.2)

## 2019-09-23 NOTE — Progress Notes (Signed)
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 At that time she underwent a lumpectomy followed by radiation treatment and was on tamoxifen for 5 years. Subsequently she has been undergoing regular screening mammograms. She self palpated a mass in the right subareolar region. This was followed by a mammogram and ultrasound in May 2019 which revealed a solid 1.1 x 0.9 x 0.5 cm right breast mass. No evidence of right axillary adenopathy. 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 Consideration includes DCIS involving a papilloma and encapsulated papillary carcinoma. Patient underwent lumpectomy on 07/09/2017 by Dr. Peyton Najjar which showed a 12 mm grade 1-2 DCIS with involvement of pre-existing papilloma and adjacent ducts. Lateral excision revealed atypical ductal hyperplasia. Margins were negative. Tumor was greater than ER 90% positive and greater than 90% PR positive. Pathologic stage Tis NX. Patientcompleted adjuvant RT and was started on arimidex in August 2019.  She could not tolerate arimidex and was switched to tamoxifen   Interval history-patient is doing well overall.  She is tolerating tamoxifen along with calcium and vitamin D well.  She has not taken her Covid vaccine yet.  ECOG PS- 1 Pain scale- 0   Review of systems- Review of Systems  Constitutional: Negative for chills, fever, malaise/fatigue and weight loss.  HENT: Negative for congestion, ear discharge and  nosebleeds.   Eyes: Negative for blurred vision.  Respiratory: Negative for cough, hemoptysis, sputum production, shortness of breath and wheezing.   Cardiovascular: Negative for chest pain, palpitations, orthopnea and claudication.  Gastrointestinal: Negative for abdominal pain, blood in stool, constipation, diarrhea, heartburn, melena, nausea and vomiting.  Genitourinary: Negative for dysuria, flank pain, frequency, hematuria and urgency.  Musculoskeletal: Negative for back pain, joint pain and myalgias.  Skin: Negative for rash.  Neurological: Negative for dizziness, tingling, focal weakness, seizures, weakness and headaches.  Endo/Heme/Allergies: Does not bruise/bleed easily.  Psychiatric/Behavioral: Negative for depression and suicidal ideas. The patient does not have insomnia.       No Known Allergies   Past Medical History:  Diagnosis Date  . Afib ([place])   . Arthritis   . Breast cancer ([place]) 2006   left breast  . Breast cancer ([place]) 2019   right breast  . Depression   . GERD (gastroesophageal reflux disease)   . Hypertension   . Insomnia   . Insomnia   . Personal history of radiation therapy 2006   36 tx.  . Personal history of radiation therapy 2019     Past Surgical History:  Procedure Laterality Date  . BREAST LUMPECTOMY Right 07/09/2017  . BREAST LUMPECTOMY Left 2006  . BREAST LUMPECTOMY WITH NEEDLE LOCALIZATION Right 07/09/2017   Procedure: BREAST LUMPECTOMY WITH NEEDLE LOCALIZATION;  Surgeon: Herbert Pun, MD;  Location: [facility] ORS;  Service: General;  Laterality: Right;  . BREAST SURGERY     left breast lumpectomy  . CARDIOVERSION N/A 03/21/2016   Procedure: Cardioversion;  Surgeon: Corey Skains, MD;  Location: [facility] ORS;  Service: Cardiovascular;  Laterality: N/A;  . CATARACT EXTRACTION, BILATERAL    .  LAPAROSCOPIC CHOLECYSTECTOMY    . TONSILLECTOMY    . VAGINAL HYSTERECTOMY      Social History   Socioeconomic History  . Marital status:  Widowed    Spouse name: Not on file  . Number of children: 3  . Years of education: Not on file  . Highest education level: Not on file  Occupational History  . Not on file  Tobacco Use  . Smoking status: Never Smoker  . Smokeless tobacco: Never Used  Vaping Use  . Vaping Use: Never used  Substance and Sexual Activity  . Alcohol use: Never  . Drug use: No  . Sexual activity: Not Currently  Other Topics Concern  . Not on file  Social History Narrative  . Not on file   Social Determinants of Health   Financial Resource Strain: Low Risk   . Difficulty of Paying Living Expenses: Not hard at all  Food Insecurity: No Food Insecurity  . Worried About Charity fundraiser in the Last Year: Never true  . Ran Out of Food in the Last Year: Never true  Transportation Needs: No Transportation Needs  . Lack of Transportation (Medical): No  . Lack of Transportation (Non-Medical): No  Physical Activity: Inactive  . Days of Exercise per Week: 0 days  . Minutes of Exercise per Session: 0 min  Stress: Stress Concern Present  . Feeling of Stress : To some extent  Social Connections: Unknown  . Frequency of Communication with Friends and Family: Patient refused  . Frequency of Social Gatherings with Friends and Family: Patient refused  . Attends Religious Services: Patient refused  . Active Member of Clubs or Organizations: Patient refused  . Attends Archivist Meetings: Patient refused  . Marital Status: Widowed  Intimate Partner Violence: Not At Risk  . Fear of Current or Ex-Partner: No  . Emotionally Abused: No  . Physically Abused: No  . Sexually Abused: No    Family History  Problem Relation Age of Onset  . Diabetes Sister   . Breast cancer Daughter      Current Outpatient Medications:  .  alendronate (FOSAMAX) 70 MG tablet, TAKE 1 TABLET BY MOUTH ONCE A WEEK. TAKE WITH A FULL GLASS OF WATER ON AN EMPTY STOMACH., Disp: 12 tablet, Rfl: 1 .  apixaban (ELIQUIS) 5 MG  TABS tablet, Take 5 mg by mouth 2 (two) times daily. cardio, Disp: , Rfl:  .  calcium-vitamin D (OSCAL WITH D) 500-200 MG-UNIT TABS tablet, Take 1 tablet by mouth 2 (two) times daily. , Disp: , Rfl:  .  celecoxib (CELEBREX) 200 MG capsule, Take 200 mg by mouth daily., Disp: , Rfl:  .  fluticasone (FLONASE) 50 MCG/ACT nasal spray, Place 2 sprays into both nostrils daily., Disp: 16 g, Rfl: 6 .  hydrochlorothiazide (MICROZIDE) 12.5 MG capsule, Take 1 capsule (12.5 mg total) by mouth daily., Disp: 90 capsule, Rfl: 1 .  lisinopril (ZESTRIL) 5 MG tablet, Take 1 tablet (5 mg total) by mouth daily., Disp: 90 tablet, Rfl: 1 .  metoprolol succinate (TOPROL-XL) 100 MG 24 hr tablet, Take 1 tablet (100 mg total) by mouth daily. Take with or immediately following a meal., Disp: 90 tablet, Rfl: 1 .  pravastatin (PRAVACHOL) 20 MG tablet, Take 1 tablet (20 mg total) by mouth daily., Disp: 90 tablet, Rfl: 1 .  sertraline (ZOLOFT) 100 MG tablet, Take 1 tablet (100 mg total) by mouth daily., Disp: 90 tablet, Rfl: 1 .  tamoxifen (NOLVADEX) 20 MG tablet,  TAKE 1 TABLET BY MOUTH EVERY DAY, Disp: 90 tablet, Rfl: 1 .  amoxicillin (AMOXIL) 500 MG capsule, Take 1 capsule (500 mg total) by mouth 3 (three) times daily. (Patient not taking: Reported on 09/23/2019), Disp: 30 capsule, Rfl: 0 .  azithromycin (ZITHROMAX) 250 MG tablet, Take 2 tablets for day 1 and then 1 tablet daily after. (Patient not taking: Reported on 09/23/2019), Disp: 6 tablet, Rfl: 0 .  Melatonin 10 MG TABS, Take 20 mg by mouth at bedtime.  (Patient not taking: Reported on 09/23/2019), Disp: , Rfl:  .  omeprazole (PRILOSEC) 40 MG capsule, Take 1 capsule (40 mg total) by mouth daily. (Patient not taking: Reported on 09/23/2019), Disp: 90 capsule, Rfl: 1  Physical exam:  Vitals:   09/23/19 1149  BP: 112/65  Pulse: 66  Resp: 18  Temp: 98 F (36.7 C)  TempSrc: Tympanic  SpO2: 98%  Weight: 225 lb 9.6 oz (102.3 kg)   Physical Exam Constitutional:      General:  She is not in acute distress.    Appearance: She is obese.  Cardiovascular:     Rate and Rhythm: Normal rate and regular rhythm.     Heart sounds: Normal heart sounds.  Pulmonary:     Effort: Pulmonary effort is normal.     Breath sounds: Normal breath sounds.  Abdominal:     General: Bowel sounds are normal.     Palpations: Abdomen is soft.  Skin:    General: Skin is warm and dry.  Neurological:     Mental Status: She is alert and oriented to person, place, and time.   Breast exam: Patient is s/p bilateral lumpectomy with a well-healed surgical scar.  No palpable masses in either breast.  No palpable bilateral axillary adenopathy.  CMP Latest Ref Rng & Units 09/23/2019  Glucose 70 - 99 mg/dL 128(H)  BUN 8 - 23 mg/dL 16  Creatinine 0.44 - 1.00 mg/dL 0.91  Sodium 135 - 145 mmol/L 144  Potassium 3.5 - 5.1 mmol/L 4.0  Chloride 98 - 111 mmol/L 106  CO2 22 - 32 mmol/L 28  Calcium 8.9 - 10.3 mg/dL 8.8(L)  Total Protein 6.5 - 8.1 g/dL 6.7  Total Bilirubin 0.3 - 1.2 mg/dL 0.6  Alkaline Phos 38 - 126 U/L 29(L)  AST 15 - 41 U/L 26  ALT 0 - 44 U/L 16   CBC Latest Ref Rng & Units 09/23/2019  WBC 4.0 - 10.5 K/uL 4.5  Hemoglobin 12.0 - 15.0 g/dL 13.0  Hematocrit 36 - 46 % 38.3  Platelets 150 - 400 K/uL 141(L)      Assessment and plan- Patient is a 79 y.o. female with right breast DCIS s/p lumpectomy currently on tamoxifen.  This is a routine follow-up visit  Clinically patient is doing well with no concerning signs and symptoms of recurrence based on today's exam.  She has a mammogram and a bone density scan scheduled for later this month.  Patient will continue to take tamoxifen until August 2024.  I have encouraged the patient to take her Covid vaccine.  She will think about it  I will see her back in 6 months for an in person visit.  Visit Diagnosis 1. Encounter for follow-up surveillance of breast cancer   2. Encounter for monitoring tamoxifen therapy      Dr. Randa Evens,  MD, MPH [facility] [phone] 09/23/2019 1:50 PM

## 2019-09-28 ENCOUNTER — Telehealth: Payer: Self-pay | Admitting: Family Medicine

## 2019-09-28 ENCOUNTER — Other Ambulatory Visit: Payer: Self-pay

## 2019-09-28 ENCOUNTER — Ambulatory Visit (INDEPENDENT_AMBULATORY_CARE_PROVIDER_SITE_OTHER): Payer: Medicare Other | Admitting: Family Medicine

## 2019-09-28 ENCOUNTER — Encounter: Payer: Self-pay | Admitting: Family Medicine

## 2019-09-28 VITALS — BP 120/80 | HR 68 | Temp 98.7°F | Ht 66.0 in | Wt 224.0 lb

## 2019-09-28 DIAGNOSIS — J0100 Acute maxillary sinusitis, unspecified: Secondary | ICD-10-CM | POA: Diagnosis not present

## 2019-09-28 DIAGNOSIS — R059 Cough, unspecified: Secondary | ICD-10-CM

## 2019-09-28 DIAGNOSIS — H1013 Acute atopic conjunctivitis, bilateral: Secondary | ICD-10-CM | POA: Diagnosis not present

## 2019-09-28 DIAGNOSIS — J301 Allergic rhinitis due to pollen: Secondary | ICD-10-CM

## 2019-09-28 DIAGNOSIS — R05 Cough: Secondary | ICD-10-CM

## 2019-09-28 MED ORDER — GUAIFENESIN-CODEINE 100-10 MG/5ML PO SYRP: 5.0000 mL | ORAL_SOLUTION | Freq: Four times a day (QID) | ORAL | 0 refills | Status: DC | PRN

## 2019-09-28 MED ORDER — OLOPATADINE HCL 0.1 % OP SOLN: 1.0000 [drp] | Freq: Two times a day (BID) | OPHTHALMIC | 12 refills | Status: DC

## 2019-09-28 MED ORDER — AZITHROMYCIN 250 MG PO TABS: ORAL_TABLET | ORAL | 0 refills | Status: DC

## 2019-09-28 NOTE — Telephone Encounter (Unsigned)
Copied from [place] [id number]. Topic: General - Other >> Sep 28, 2019 11:16 AM Oneta Rack wrote: Reason for CRM:  Patient states PCP prescribed Flonase and another allergy medication but sinuses have not improved, patient experiencing watery eyes, sinus drainage and cough. Patient unable to sleep at night and would like Baxter Flattery to return her call.

## 2019-09-28 NOTE — Telephone Encounter (Signed)
Pt coming in today 09/28/19

## 2019-09-28 NOTE — Telephone Encounter (Signed)
Call her and see if she wants to come in this afternoon please

## 2019-09-28 NOTE — Progress Notes (Signed)
Date:  09/28/2019   Name:  Melissa Chase   DOB:  13-Apr-1940   MRN:  [id number]   Chief Complaint: No chief complaint on file.  URI  This is a recurrent problem. The current episode started 1 to 4 weeks ago. The problem has been waxing and waning. There has been no fever. Associated symptoms include congestion, coughing, a plugged ear sensation, rhinorrhea, sinus pain and sneezing. Pertinent negatives include no abdominal pain, chest pain, diarrhea, dysuria, ear pain, headaches, joint pain, joint swelling, nausea, neck pain, rash, sore throat, swollen glands, vomiting or wheezing. Treatments tried: flonase/mucinex dm. The treatment provided mild relief.  Cough This is a new problem. The current episode started more than 1 month ago. The problem has been waxing and waning. The cough is non-productive. Associated symptoms include postnasal drip and rhinorrhea. Pertinent negatives include no chest pain, chills, ear pain, eye redness, fever, headaches, myalgias, rash, sore throat, shortness of breath or wheezing. There is no history of environmental allergies.    Lab Results  Component Value Date   CREATININE 0.91 09/23/2019   BUN 16 09/23/2019   NA 144 09/23/2019   K 4.0 09/23/2019   CL 106 09/23/2019   CO2 28 09/23/2019   Lab Results  Component Value Date   CHOL 193 05/26/2019   HDL 84 05/26/2019   LDLCALC 90 05/26/2019   TRIG 94 05/26/2019   CHOLHDL 2.3 05/26/2019   Lab Results  Component Value Date   TSH 2.210 09/03/2018   Lab Results  Component Value Date   HGBA1C 5.4 01/01/2013   Lab Results  Component Value Date   WBC 4.5 09/23/2019   HGB 13.0 09/23/2019   HCT 38.3 09/23/2019   MCV 92.1 09/23/2019   PLT 141 (L) 09/23/2019   Lab Results  Component Value Date   ALT 16 09/23/2019   AST 26 09/23/2019   ALKPHOS 29 (L) 09/23/2019   BILITOT 0.6 09/23/2019     Review of Systems  Constitutional: Negative.  Negative for chills, fatigue, fever and unexpected  weight change.  HENT: Positive for congestion, postnasal drip, rhinorrhea, sinus pain and sneezing. Negative for ear discharge, ear pain, sinus pressure and sore throat.   Eyes: Negative for photophobia, pain, discharge, redness and itching.  Respiratory: Positive for cough. Negative for shortness of breath, wheezing and stridor.   Cardiovascular: Negative for chest pain.  Gastrointestinal: Negative for abdominal pain, blood in stool, constipation, diarrhea, nausea and vomiting.  Endocrine: Negative for cold intolerance, heat intolerance, polydipsia, polyphagia and polyuria.  Genitourinary: Negative for dysuria, flank pain, frequency, hematuria, menstrual problem, pelvic pain, urgency, vaginal bleeding and vaginal discharge.  Musculoskeletal: Negative for arthralgias, back pain, joint pain, myalgias and neck pain.  Skin: Negative for rash.  Allergic/Immunologic: Negative for environmental allergies and food allergies.  Neurological: Negative for dizziness, weakness, light-headedness, numbness and headaches.  Hematological: Negative for adenopathy. Does not bruise/bleed easily.  Psychiatric/Behavioral: Negative for dysphoric mood. The patient is not nervous/anxious.     Patient Active Problem List   Diagnosis Date Noted  . Obesity (BMI 30-39.9) 10/26/2018  . 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 Goals of care, counseling/discussion 09/18/2017  . Sacroiliitis ([place]) 02/04/2017  . Dilated cardiomyopathy ([place]) 05/02/2016  . Bradycardia 04/04/2016  . A-fib ([place]) 03/20/2016  . Acute dyspnea 02/20/2016  . 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 Arthritis 06/06/2014  . Aggrieved 06/06/2014  . Dermatitis, eczematoid 06/06/2014  .  Recurrent major depressive episodes ([place]) 06/06/2014  . Essential (primary) hypertension 06/06/2014  . Gastro-esophageal reflux disease without esophagitis 06/06/2014  . Cannot  sleep 06/06/2014  . Menopause 06/06/2014    No Known Allergies  Past Surgical History:  Procedure Laterality Date  . BREAST LUMPECTOMY Right 07/09/2017  . BREAST LUMPECTOMY Left 2006  . BREAST LUMPECTOMY WITH NEEDLE LOCALIZATION Right 07/09/2017   Procedure: BREAST LUMPECTOMY WITH NEEDLE LOCALIZATION;  Surgeon: Herbert Pun, MD;  Location: [facility] ORS;  Service: General;  Laterality: Right;  . BREAST SURGERY     left breast lumpectomy  . CARDIOVERSION N/A 03/21/2016   Procedure: Cardioversion;  Surgeon: Corey Skains, MD;  Location: [facility] ORS;  Service: Cardiovascular;  Laterality: N/A;  . CATARACT EXTRACTION, BILATERAL    . LAPAROSCOPIC CHOLECYSTECTOMY    . TONSILLECTOMY    . VAGINAL HYSTERECTOMY      Social History   Tobacco Use  . Smoking status: Never Smoker  . Smokeless tobacco: Never Used  Vaping Use  . Vaping Use: Never used  Substance Use Topics  . Alcohol use: Never  . Drug use: No     Medication list has been reviewed and updated.  No outpatient medications have been marked as taking for the 09/28/19 encounter (Appointment) with Juline Patch, MD.    [facility] 2/9 Scores 08/19/2019 05/25/2019 02/25/2019 11/30/2018  PHQ - 2 Score 0 0 0 1  PHQ- 9 Score 0 0 0 2    GAD 7 : Generalized Anxiety Score 08/19/2019 05/25/2019 02/25/2019 11/30/2018  Nervous, Anxious, on Edge 0 0 1 0  Control/stop worrying 0 0 0 0  Worry too much - different things 0 0 0 0  Trouble relaxing 0 0 0 0  Restless 0 0 0 0  Easily annoyed or irritable 0 0 0 2  Afraid - awful might happen 0 0 0 0  Total GAD 7 Score 0 0 1 2  Anxiety Difficulty - - Not difficult at all Somewhat difficult    BP Readings from Last 3 Encounters:  09/23/19 112/65  08/19/19 122/70  05/25/19 120/70    Physical Exam Vitals and nursing note reviewed.  Constitutional:      Appearance: She is well-developed.  HENT:     Head: Normocephalic.     Jaw: There is normal jaw occlusion.     Right Ear: Hearing, tympanic  membrane, ear canal and external ear normal.     Left Ear: Hearing, tympanic membrane, ear canal and external ear normal.     Nose:     Right Sinus: Maxillary sinus tenderness present.     Left Sinus: Maxillary sinus tenderness present.     Mouth/Throat:     Lips: Pink.     Mouth: Mucous membranes are moist.     Tongue: No lesions. Tongue does not deviate from midline.     Palate: No mass and lesions.     Pharynx: Oropharynx is clear.  Eyes:     General: Lids are everted, no foreign bodies appreciated. No scleral icterus.       Left eye: No foreign body or hordeolum.     Conjunctiva/sclera:     Right eye: Right conjunctiva is injected.     Left eye: Left conjunctiva is injected.     Pupils: Pupils are equal, round, and reactive to light.  Neck:     Thyroid: No thyromegaly.     Vascular: No JVD.     Trachea: No tracheal deviation.  Cardiovascular:  Rate and Rhythm: Normal rate and regular rhythm.     Heart sounds: Normal heart sounds and S1 normal. No murmur heard.  No systolic murmur is present.  No diastolic murmur is present.  No friction rub. No gallop. No S3 or S4 sounds.   Pulmonary:     Effort: Pulmonary effort is normal. No respiratory distress.     Breath sounds: Normal breath sounds. No transmitted upper airway sounds. No decreased breath sounds, wheezing, rhonchi or rales.  Abdominal:     General: Bowel sounds are normal.     Palpations: Abdomen is soft. There is no mass.     Tenderness: There is no abdominal tenderness. There is no guarding or rebound.  Musculoskeletal:        General: No tenderness. Normal range of motion.     Cervical back: Normal range of motion and neck supple.  Lymphadenopathy:     Cervical: No cervical adenopathy.  Skin:    General: Skin is warm.     Findings: No rash.  Neurological:     Mental Status: She is alert and oriented to person, place, and time.     Cranial Nerves: No cranial nerve deficit.     Deep Tendon Reflexes: Reflexes  normal.  Psychiatric:        Mood and Affect: Mood is not anxious or depressed.     Wt Readings from Last 3 Encounters:  09/23/19 225 lb 9.6 oz (102.3 kg)  08/19/19 (!) 228 lb (103.4 kg)  05/25/19 234 lb (106.1 kg)    There were no vitals taken for this visit.  Assessment and Plan: 1. Acute non-recurrent maxillary sinusitis New onset.  Persistent.  Stable.  History and exam today is consistent with a recurrent sinusitis.  Will initiate a azithromycin 2 today followed by 1 a day for 4 days. - azithromycin (ZITHROMAX) 250 MG tablet; 2 today then 1 a day for four days  Dispense: 6 tablet; Refill: 0  2. Allergic conjunctivitis of both eyes Chronic.  Uncontrolled.  Conjunctiva has some cobblestoning consistent with allergic conjunctivitis we will present with over-the-counter Patanol to be used in both eyes 1 drop in each eyes twice a day. - olopatadine (PATANOL) 0.1 % ophthalmic solution; Place 1 drop into both eyes 2 (two) times daily.  Dispense: 5 mL; Refill: 12  3. Seasonal allergic rhinitis due to pollen Chronic.  Uncontrolled.  Relatively stable.  This particularly has been worse since the recent rains and is consistent with a seasonal rhinitis due to pollen counts particularly ragweed at this time.  I discussed the nature of the problem with patient and she understands that we are awaiting her first frost before we have significant improvement in her symptoms.  In the meantime I have suggested that she pick up some loratadine D10 milligrams to be used as directed.  4. Cough Chronic.  Particularly nocturnal.  Patient has been instructed to continue her Mucinex DM during the day but did take a dosing of Robitussin-AC at night to decrease her cough when she sleeps. - guaiFENesin-codeine (ROBITUSSIN AC) 100-10 MG/5ML syrup; Take 5 mLs by mouth 4 (four) times daily as needed for cough.  Dispense: 118 mL; Refill: 0

## 2019-10-07 ENCOUNTER — Other Ambulatory Visit: Payer: Self-pay

## 2019-10-07 ENCOUNTER — Other Ambulatory Visit: Payer: Self-pay | Admitting: Oncology

## 2019-10-07 ENCOUNTER — Ambulatory Visit
Admission: RE | Admit: 2019-10-07 | Discharge: 2019-10-07 | Disposition: A | Discharge status: Home / Self Care | Payer: Medicare Other | Source: Ambulatory Visit | Attending: Oncology | Admitting: Oncology

## 2019-10-07 DIAGNOSIS — D0511 Intraductal carcinoma in situ of right breast: Secondary | ICD-10-CM

## 2019-10-07 DIAGNOSIS — M85851 Other specified disorders of bone density and structure, right thigh: Secondary | ICD-10-CM | POA: Diagnosis not present

## 2019-10-07 DIAGNOSIS — R922 Inconclusive mammogram: Secondary | ICD-10-CM | POA: Diagnosis not present

## 2019-10-07 DIAGNOSIS — Z780 Asymptomatic menopausal state: Secondary | ICD-10-CM | POA: Diagnosis not present

## 2019-10-07 DIAGNOSIS — R921 Mammographic calcification found on diagnostic imaging of breast: Secondary | ICD-10-CM

## 2019-10-07 DIAGNOSIS — R928 Other abnormal and inconclusive findings on diagnostic imaging of breast: Secondary | ICD-10-CM

## 2019-10-07 DIAGNOSIS — Z853 Personal history of malignant neoplasm of breast: Secondary | ICD-10-CM | POA: Diagnosis not present

## 2019-10-07 DIAGNOSIS — R29890 Loss of height: Secondary | ICD-10-CM | POA: Diagnosis not present

## 2019-10-12 ENCOUNTER — Ambulatory Visit
Admission: RE | Admit: 2019-10-12 | Discharge: 2019-10-12 | Disposition: A | Discharge status: Home / Self Care | Payer: Medicare Other | Source: Ambulatory Visit | Attending: Oncology | Admitting: Oncology

## 2019-10-12 ENCOUNTER — Other Ambulatory Visit: Payer: Self-pay

## 2019-10-12 DIAGNOSIS — R928 Other abnormal and inconclusive findings on diagnostic imaging of breast: Secondary | ICD-10-CM

## 2019-10-12 DIAGNOSIS — R921 Mammographic calcification found on diagnostic imaging of breast: Secondary | ICD-10-CM

## 2019-10-12 DIAGNOSIS — D0512 Intraductal carcinoma in situ of left breast: Secondary | ICD-10-CM | POA: Diagnosis not present

## 2019-10-12 HISTORY — PX: BREAST BIOPSY: SHX20

## 2019-10-14 ENCOUNTER — Encounter: Payer: Self-pay | Admitting: *Deleted

## 2019-10-14 LAB — SURGICAL PATHOLOGY

## 2019-10-14 NOTE — Progress Notes (Signed)
Patient with new diagnosis of DCIS of the left breast.  Patient has history of left breast cancer in the late 1990's or early 2000's per patient.  States she had a lumpectomy and radiation therapy at that time.  She also has a history of right breast DCIS in 2019.  I called patient to re-establish navigation services.  I have scheduled patient to return to see Dr. Peyton Najjar for surgical consult on 10/15/19 @ 8:45 and Dr. Janese Banks on Thursday, 10/21/19 at 9:00.  She is to call if she has any questions or needs.

## 2019-10-15 DIAGNOSIS — D0512 Intraductal carcinoma in situ of left breast: Secondary | ICD-10-CM | POA: Diagnosis not present

## 2019-10-18 ENCOUNTER — Inpatient Hospital Stay: Payer: Medicare Other | Admitting: Oncology

## 2019-10-21 ENCOUNTER — Inpatient Hospital Stay: Payer: Medicare Other | Admitting: Oncology

## 2019-10-21 ENCOUNTER — Ambulatory Visit: Payer: Self-pay | Admitting: General Surgery

## 2019-10-21 ENCOUNTER — Other Ambulatory Visit: Payer: Self-pay

## 2019-10-21 ENCOUNTER — Other Ambulatory Visit: Payer: Self-pay | Admitting: General Surgery

## 2019-10-21 ENCOUNTER — Ambulatory Visit (INDEPENDENT_AMBULATORY_CARE_PROVIDER_SITE_OTHER): Payer: Medicare Other | Admitting: Plastic Surgery

## 2019-10-21 ENCOUNTER — Encounter: Payer: Self-pay | Admitting: Plastic Surgery

## 2019-10-21 VITALS — HR 67 | Temp 98.2°F | Ht 65.5 in | Wt 222.4 lb

## 2019-10-21 DIAGNOSIS — D0512 Intraductal carcinoma in situ of left breast: Secondary | ICD-10-CM

## 2019-10-21 DIAGNOSIS — C50912 Malignant neoplasm of unspecified site of left female breast: Secondary | ICD-10-CM

## 2019-10-21 NOTE — H&P (View-Only) (Signed)
HISTORY OF PRESENT ILLNESS:    Melissa Chase is a 79 y.o.female patient who comes for evaluation of newly diagnosed DCIS of the left breast.   Patient withhistory of diagnostic mammogramand ultrasoundon 06/13/17 and was found with a right breastsolid massat 1230 that measured around 1.1 x 0.9 x 0.5 cm with internal vascularity. No axillary lymphadenopathy seen. The mass was biopsied using ultrasound guided core biopsy. 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 Excision was recommended. 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  Today patient present with new mammogram that shows calcifications on the left breast. She has core biopsy of the calcifications that shows high grade DCIS.   She denies any skin changes, no palpable masses, no axillary adenopathy.       PAST MEDICAL HISTORY:  Past Medical History:  Diagnosis Date  . Atrial fibrillation (CMS-HCC)   . Breast cancer (CMS-HCC)   . Cardiomyopathy, secondary (CMS-HCC)   . Hyperlipidemia   . Hypertension         PAST SURGICAL HISTORY:        Past Surgical History:  Procedure Laterality Date  . CHOLECYSTECTOMY    . 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 EGD  11/04/2003   No repeat per RTE  . Excision of breast mass  Right 07/09/2017   Dr Herbert Pun          MEDICATIONS:  Encounter Medications        Outpatient Encounter Medications as of 10/15/2019  Medication Sig Dispense Refill  . acetaminophen (TYLENOL) 500 MG tablet  Take 1,000 mg by mouth 2 (two) times daily Patient takes 500 mg in the am and 1,000 mg in the pm    . alendronate (FOSAMAX) 70 MG tablet Take 1 tablet by mouth once a week    . calcium carbonate-vitamin D3 (OYSTER SHELL CALCIUM-VIT D3) 500 mg(1,250mg ) -200 unit tablet Take 1 tablet by mouth 2 (two) times daily with meals    . celecoxib (CELEBREX) 200 MG capsule TAKE 1 CAPSULE (200 MG TOTAL) BY MOUTH ONCE DAILY FOR 90 DAYS 90 capsule 1  . ELIQUIS 5 mg tablet TAKE 1 TABLET BY MOUTH EVERY 12 HOURS 180 tablet 1  . hydroCHLOROthiazide (MICROZIDE) 12.5 mg capsule TAKE 1 CAPSULE BY MOUTH EVERY DAY 30 capsule 0  . lisinopril (PRINIVIL,ZESTRIL) 5 MG tablet Take 1 tablet (5 mg total) by mouth once daily 30 tablet 11  . melatonin 10 mg Tab Take 1 tablet by mouth nightly    . metoprolol succinate (TOPROL-XL) 100 MG XL tablet Take 100 mg by mouth once daily.      . pravastatin (PRAVACHOL) 20 MG tablet Take 1 tablet (20 mg total) by mouth once daily 90 tablet 3  . sertraline (ZOLOFT) 100 MG tablet Take 100 mg by mouth once daily.      . tamoxifen (NOLVADEX) 20 MG tablet Take 1 tablet by mouth once daily    . tiZANidine (ZANAFLEX) 4 MG tablet TAKE 1 TABLET BY MOUTH EVERY DAY AT NIGHT AS NEEDED 30 tablet 1  . NON FORMULARY  Take 50 mg by mouth every other day Hemp Oil    (Patient not taking: Reported on 10/15/2019  )    . omeprazole (PRILOSEC) 40 MG DR capsule Take 40 mg by mouth once daily (Patient not taking: Reported on 10/15/2019  )  6   No facility-administered encounter medications on file as of 10/15/2019.       ALLERGIES:   Patient has no known allergies.   SOCIAL HISTORY:  Social History          Socioeconomic History  . Marital status: Widowed    Spouse name: Not on file  . Number of children: Not on file  . Years of education: Not on file  . Highest education level: Not on file  Occupational History  . Not on file  Tobacco Use  . Smoking status: Never Smoker  .  Smokeless tobacco: Never Used  Vaping Use  . Vaping Use: Never used  Substance and Sexual Activity  . Alcohol use: Not Currently  . Drug use: No  . Sexual activity: Never  Other Topics Concern  . Not on file  Social History Narrative  . Not on file   Social Determinants of Health      Financial Resource Strain:   . Difficulty of Paying Living Expenses:   Food Insecurity:   . Worried About Charity fundraiser in the Last Year:   . Arboriculturist in the Last Year:   Transportation Needs:   . Film/video editor (Medical):   Marland Kitchen Lack of Transportation (Non-Medical):       FAMILY HISTORY:       Family History  Problem Relation Age of Onset  . Coronary Artery Disease (Blocked arteries around heart) Mother   . Breast cancer Daughter      GENERAL REVIEW OF SYSTEMS:   General ROS: negative for - chills, fatigue, fever, weight gain or weight loss Allergy and Immunology ROS: negative for - hives  Hematological and Lymphatic ROS: negative for - bleeding problems or bruising, negative for palpable nodes Endocrine ROS: negative for - heat or cold intolerance, hair changes Respiratory ROS: negative for - cough, shortness of breath or wheezing Cardiovascular ROS: no chest pain or palpitations GI ROS: negative for nausea, vomiting, abdominal pain, diarrhea, constipation Musculoskeletal ROS: negative for - joint swelling or muscle pain Neurological ROS: negative for - confusion, syncope Dermatological ROS: negative for pruritus and rash  PHYSICAL EXAM:     Vitals:   10/15/19 0842  BP: 108/66  Pulse: 59  .  Ht:167.6 cm (5\' 6" ) Wt:(!) 102.5 kg (226 lb) OBS:JGGE surface area is 2.18 meters squared. Body mass index is 36.48 kg/m.Marland Kitchen   GENERAL: Alert, active, oriented x3  HEENT: Pupils equal reactive to light. Extraocular movements are intact. Sclera clear. Palpebral conjunctiva normal red color.Pharynx clear.  NECK: Supple with no palpable mass and no adenopathy.   LUNGS: Sound clear with no rales rhonchi or wheezes.  HEART: Regular rhythm S1 and S2 without murmur.  BREAST: both breast examined in the sitting and supine position. There is no palpable masses, no skin changes, no axillary adenopathy. There are bilateral breast well healed scars.   ABDOMEN: Soft and depressible, nontender with no palpable mass, no hepatomegaly.   EXTREMITIES: Well-developed well-nourished symmetrical with no dependent edema.  NEUROLOGICAL: Awake alert oriented, facial expression symmetrical, moving all extremities.      IMPRESSION:     Ductal carcinoma in situ (DCIS) of left breast [D05.12]  Patient with newly diagnosed DCIS of the left breast. Patient has history of DCIS of the left breast in 2006. This was treated with partial mastectomy, radiation therapy and anti estrogen therapy. Patient also with history of right breast DCIS treated with partial mastectomy, radiation therapy and is currently on anti estrogen therapy. This new DCIS grew during anti estrogen therapy.   Due to patient history of left breast radiation therapy, I discuss with the patient the recommendation of having total mastectomy. Due to patient history of bilateral breast cancer, she asked for the possibility of having bilateral mastectomy. I think that with this patient history of 3 DCIS, last one arising even during anti estrogen therapy , the need of left total mastectomy due to previous radiation of the left breast, I consider that is resonable to have bilateral mastectomy. Patient is also considering having immediate reconstruction.   I discuss with the patient the goal of the total mastectomy, I also discussed with patient the risk of surgery that includes, infection, bleeding, seroma, pain, scar, contracture, lymphadenopathy, among others.   She agree with plan. I coordinated appointment with Plastic Surgery next week.     PLAN:  1. Bilateral mastectomy with immediate  reconstruction (19303 x 2) 2. Left axillary sentinel lymph node biopsy ([id number])  Patient verbalized understanding, all questions were answered, and were agreeable with the plan outlined above.   I spent a total of 45 minutes in both face-to-face and non-face-to-face activities for this visit on the date of this encounter.   Herbert Pun, MD  Electronically signed by Herbert Pun, MD

## 2019-10-21 NOTE — Progress Notes (Signed)
Referring Provider Juline Patch, MD [address] [place],  [place] [address]   CC:  Chief Complaint  Patient presents with  . Advice Only      Melissa Chase is an 79 y.o. female.  HPI: Patient presents to discuss breast reconstruction.  She was initially diagnosed with breast cancer on the left side and underwent breast conservation therapy over 10 years ago.  She subsequently developed a new breast cancer on the right side which was also treated with breast conservation therapy since then.  Now she is developed what is believed to be a new primary on the left identified through mammography.  She has seen Dr. Windell Moment and has been discussing bilateral mastectomies.  She is here to discuss her reconstructive options.  Of note she is on Eliquis for history of A. fib.  She says she had a procedure to get her back into a regular heart rhythm over 3 years ago and has not been in A. fib since but has stayed on the blood thinner.  No Known Allergies  Outpatient Encounter Medications as of 10/21/2019  Medication Sig  . alendronate (FOSAMAX) 70 MG tablet TAKE 1 TABLET BY MOUTH ONCE A WEEK. TAKE WITH A FULL GLASS OF WATER ON AN EMPTY STOMACH.  Marland Kitchen apixaban (ELIQUIS) 5 MG TABS tablet Take 5 mg by mouth 2 (two) times daily. cardio  . calcium-vitamin D (OSCAL WITH D) 500-200 MG-UNIT TABS tablet Take 1 tablet by mouth 2 (two) times daily.   . celecoxib (CELEBREX) 200 MG capsule Take 200 mg by mouth daily.  . hydrochlorothiazide (MICROZIDE) 12.5 MG capsule Take 1 capsule (12.5 mg total) by mouth daily.  Marland Kitchen lisinopril (ZESTRIL) 5 MG tablet Take 1 tablet (5 mg total) by mouth daily.  . metoprolol succinate (TOPROL-XL) 100 MG 24 hr tablet Take 1 tablet (100 mg total) by mouth daily. Take with or immediately following a meal.  . pravastatin (PRAVACHOL) 20 MG tablet Take 1 tablet (20 mg total) by mouth daily.  . sertraline (ZOLOFT) 100 MG tablet Take 1 tablet (100 mg total) by mouth daily.    . tamoxifen (NOLVADEX) 20 MG tablet TAKE 1 TABLET BY MOUTH EVERY DAY  . [DISCONTINUED] amoxicillin (AMOXIL) 500 MG capsule Take 1 capsule (500 mg total) by mouth 3 (three) times daily. (Patient not taking: Reported on 09/23/2019)  . [DISCONTINUED] azithromycin (ZITHROMAX) 250 MG tablet Take 2 tablets for day 1 and then 1 tablet daily after. (Patient not taking: Reported on 09/23/2019)  . [DISCONTINUED] azithromycin (ZITHROMAX) 250 MG tablet 2 today then 1 a day for four days  . [DISCONTINUED] fluticasone (FLONASE) 50 MCG/ACT nasal spray Place 2 sprays into both nostrils daily.  . [DISCONTINUED] guaiFENesin-codeine (ROBITUSSIN AC) 100-10 MG/5ML syrup Take 5 mLs by mouth 4 (four) times daily as needed for cough.  . [DISCONTINUED] Melatonin 10 MG TABS Take 20 mg by mouth at bedtime.  (Patient not taking: Reported on 09/23/2019)  . [DISCONTINUED] olopatadine (PATANOL) 0.1 % ophthalmic solution Place 1 drop into both eyes 2 (two) times daily.  . [DISCONTINUED] omeprazole (PRILOSEC) 40 MG capsule Take 1 capsule (40 mg total) by mouth daily. (Patient not taking: Reported on 09/23/2019)  . [DISCONTINUED] tamoxifen (NOLVADEX) 20 MG tablet TAKE 1 TABLET BY MOUTH EVERY DAY   No facility-administered encounter medications on file as of 10/21/2019.     Past Medical History:  Diagnosis Date  . Afib ([facility])   . Arthritis   . Breast cancer ([facility]) 2006  left breast  . Breast cancer ([facility]) 2019   right breast  . Depression   . GERD (gastroesophageal reflux disease)   . Hypertension   . Insomnia   . Insomnia   . Personal history of radiation therapy 2006   36 tx.  . Personal history of radiation therapy 2019    Past Surgical History:  Procedure Laterality Date  . 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 BREAST BIOPSY Left 10/12/2019   affirm bx of calcs, x marker, path pending  . 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 BREAST  LUMPECTOMY Right 07/09/2017   DCIS, clear margins  . BREAST LUMPECTOMY Left 2006   DCIS  . BREAST LUMPECTOMY WITH NEEDLE LOCALIZATION Right 07/09/2017   Procedure: BREAST LUMPECTOMY WITH NEEDLE LOCALIZATION;  Surgeon: Herbert Pun, MD;  Location: [facility] ORS;  Service: General;  Laterality: Right;  . BREAST SURGERY     left breast lumpectomy  . CARDIOVERSION N/A 03/21/2016   Procedure: Cardioversion;  Surgeon: Corey Skains, MD;  Location: [facility] ORS;  Service: Cardiovascular;  Laterality: N/A;  . CATARACT EXTRACTION, BILATERAL    . LAPAROSCOPIC CHOLECYSTECTOMY    . TONSILLECTOMY    . VAGINAL HYSTERECTOMY      Family History  Problem Relation Age of Onset  . Diabetes Sister   . Breast cancer Daughter        55's    Social History   Social History Narrative  . Not on file  Denies tobacco use  Review of Systems General: Denies fevers, chills, weight loss CV: Denies chest pain, shortness of breath, palpitations  Physical Exam Vitals with BMI 10/21/2019 09/28/2019 09/23/2019  Height 5' 5.5" 5\' 6"  -  Weight 222 lbs 6 oz 224 lbs 225 lbs 10 oz  BMI 89.38 10.17 -  Systolic (No Data) 510 258  Diastolic (No Data) 80 65  Pulse 67 68 66    General:  No acute distress,  Alert and oriented, Non-Toxic, Normal speech and affect Breast: She has grade 2 ptosis.  On the left side she has a bruise from her biopsy site which is adjacent to her lumpectomy scar.  The radiation skin changes are mild.  On the right side she has a periareolar incision which is caused some deformity of the nipple and again has very mild radiation skin changes.  Her base width is about 12 cm  Assessment/Plan Patient presents to discuss breast reconstruction after surgical treatment of a new left-sided breast cancer.  She has had radiation therapy in the past but does not have significant skin fibrosis related to those treatments.  I think should be an acceptable candidate for tissue expander reconstruction.  We  briefly touched on autologous reconstruction but I do not believe that that would be best for her at this point in time and she is in agreement.  I explained that a tissue expander will be placed after her mastectomy.  This would then be inflated to an agreed-upon sized and switched out for permanent gel implants.  I explained my preference would be to place the tissue expander in the prepectoral plane as long as I felt I could do it safely.  I explained I would examine the skin flaps using indocyanine green angiography intraoperatively to assess the safety of tissue expander plane.  I did explain that her radiation therapy put her at a higher risk of reconstructive failure but that I thought  given the mild radiation changes that would be suitable enough to perform this reconstructive approach.  I did explain that I would plan to place a wound VAC incisional dressing afterwards.  I discussed the risk of the procedure that include bleeding, infection, damage to surrounding structures and need for additional procedures.  I explained that her Eliquis puts her at a higher risk of bleeding throughout the reconstructive process.  We will have to coordinate with her cardiologist about appropriate management of this medication.  All of her questions were answered and we will plan to coordinate with Dr. Windell Moment for immediate reconstruction.  Cindra Presume 10/21/2019, 12:32 PM

## 2019-10-21 NOTE — H&P (Signed)
HISTORY OF PRESENT ILLNESS:    Ms. Musolf is a 79 y.o.female patient who comes for evaluation of newly diagnosed DCIS of the left breast.   Patient withhistory of diagnostic mammogramand ultrasoundon 06/13/17 and was found with a right breastsolid massat 1230 that measured around 1.1 x 0.9 x 0.5 cm with internal vascularity. No axillary lymphadenopathy seen. The mass was biopsied using ultrasound guided core biopsy. 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 Excision was recommended. 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  Today patient present with new mammogram that shows calcifications on the left breast. She has core biopsy of the calcifications that shows high grade DCIS.   She denies any skin changes, no palpable masses, no axillary adenopathy.       PAST MEDICAL HISTORY:  Past Medical History:  Diagnosis Date  . Atrial fibrillation (CMS-HCC)   . Breast cancer (CMS-HCC)   . Cardiomyopathy, secondary (CMS-HCC)   . Hyperlipidemia   . Hypertension         PAST SURGICAL HISTORY:        Past Surgical History:  Procedure Laterality Date  . CHOLECYSTECTOMY    . 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 EGD  11/04/2003   No repeat per RTE  . Excision of breast mass  Right 07/09/2017   Dr Herbert Pun          MEDICATIONS:  Encounter Medications        Outpatient Encounter Medications as of 10/15/2019  Medication Sig Dispense Refill  . acetaminophen (TYLENOL) 500 MG tablet  Take 1,000 mg by mouth 2 (two) times daily Patient takes 500 mg in the am and 1,000 mg in the pm    . alendronate (FOSAMAX) 70 MG tablet Take 1 tablet by mouth once a week    . calcium carbonate-vitamin D3 (OYSTER SHELL CALCIUM-VIT D3) 500 mg(1,250mg ) -200 unit tablet Take 1 tablet by mouth 2 (two) times daily with meals    . celecoxib (CELEBREX) 200 MG capsule TAKE 1 CAPSULE (200 MG TOTAL) BY MOUTH ONCE DAILY FOR 90 DAYS 90 capsule 1  . ELIQUIS 5 mg tablet TAKE 1 TABLET BY MOUTH EVERY 12 HOURS 180 tablet 1  . hydroCHLOROthiazide (MICROZIDE) 12.5 mg capsule TAKE 1 CAPSULE BY MOUTH EVERY DAY 30 capsule 0  . lisinopril (PRINIVIL,ZESTRIL) 5 MG tablet Take 1 tablet (5 mg total) by mouth once daily 30 tablet 11  . melatonin 10 mg Tab Take 1 tablet by mouth nightly    . metoprolol succinate (TOPROL-XL) 100 MG XL tablet Take 100 mg by mouth once daily.      . pravastatin (PRAVACHOL) 20 MG tablet Take 1 tablet (20 mg total) by mouth once daily 90 tablet 3  . sertraline (ZOLOFT) 100 MG tablet Take 100 mg by mouth once daily.      . tamoxifen (NOLVADEX) 20 MG tablet Take 1 tablet by mouth once daily    . tiZANidine (ZANAFLEX) 4 MG tablet TAKE 1 TABLET BY MOUTH EVERY DAY AT NIGHT AS NEEDED 30 tablet 1  . NON FORMULARY  Take 50 mg by mouth every other day Hemp Oil    (Patient not taking: Reported on 10/15/2019  )    . omeprazole (PRILOSEC) 40 MG DR capsule Take 40 mg by mouth once daily (Patient not taking: Reported on 10/15/2019  )  6   No facility-administered encounter medications on file as of 10/15/2019.       ALLERGIES:   Patient has no known allergies.   SOCIAL HISTORY:  Social History          Socioeconomic History  . Marital status: Widowed    Spouse name: Not on file  . Number of children: Not on file  . Years of education: Not on file  . Highest education level: Not on file  Occupational History  . Not on file  Tobacco Use  . Smoking status: Never Smoker  .  Smokeless tobacco: Never Used  Vaping Use  . Vaping Use: Never used  Substance and Sexual Activity  . Alcohol use: Not Currently  . Drug use: No  . Sexual activity: Never  Other Topics Concern  . Not on file  Social History Narrative  . Not on file   Social Determinants of Health      Financial Resource Strain:   . Difficulty of Paying Living Expenses:   Food Insecurity:   . Worried About Charity fundraiser in the Last Year:   . Arboriculturist in the Last Year:   Transportation Needs:   . Film/video editor (Medical):   Marland Kitchen Lack of Transportation (Non-Medical):       FAMILY HISTORY:       Family History  Problem Relation Age of Onset  . Coronary Artery Disease (Blocked arteries around heart) Mother   . Breast cancer Daughter      GENERAL REVIEW OF SYSTEMS:   General ROS: negative for - chills, fatigue, fever, weight gain or weight loss Allergy and Immunology ROS: negative for - hives  Hematological and Lymphatic ROS: negative for - bleeding problems or bruising, negative for palpable nodes Endocrine ROS: negative for - heat or cold intolerance, hair changes Respiratory ROS: negative for - cough, shortness of breath or wheezing Cardiovascular ROS: no chest pain or palpitations GI ROS: negative for nausea, vomiting, abdominal pain, diarrhea, constipation Musculoskeletal ROS: negative for - joint swelling or muscle pain Neurological ROS: negative for - confusion, syncope Dermatological ROS: negative for pruritus and rash  PHYSICAL EXAM:     Vitals:   10/15/19 0842  BP: 108/66  Pulse: 59  .  Ht:167.6 cm (5\' 6" ) Wt:(!) 102.5 kg (226 lb) VFI:EPPI surface area is 2.18 meters squared. Body mass index is 36.48 kg/m.Marland Kitchen   GENERAL: Alert, active, oriented x3  HEENT: Pupils equal reactive to light. Extraocular movements are intact. Sclera clear. Palpebral conjunctiva normal red color.Pharynx clear.  NECK: Supple with no palpable mass and no  adenopathy.  LUNGS: Sound clear with no rales rhonchi or wheezes.  HEART: Regular rhythm S1 and S2 without murmur.  BREAST: both breast examined in the sitting and supine position. There is no palpable masses, no skin changes, no axillary adenopathy. There are bilateral breast well healed scars.   ABDOMEN: Soft and depressible, nontender with no palpable mass, no hepatomegaly.   EXTREMITIES: Well-developed well-nourished symmetrical with no dependent edema.  NEUROLOGICAL: Awake alert oriented, facial expression symmetrical, moving all extremities.      IMPRESSION:     Ductal carcinoma in situ (DCIS) of left breast [D05.12]  Patient with newly diagnosed DCIS of the left breast. Patient has history of DCIS of the left breast in 2006. This was treated with partial mastectomy, radiation therapy and anti estrogen therapy. Patient also with history of right breast DCIS treated with partial mastectomy, radiation therapy and is currently on anti estrogen therapy. This new DCIS grew during anti estrogen therapy.   Due to patient history of left breast radiation therapy, I discuss with the patient the recommendation of having total mastectomy. Due to patient history of bilateral breast cancer, she asked for the possibility of having bilateral mastectomy. I think that with this patient history of 3 DCIS, last one arising even during anti estrogen therapy , the need of left total mastectomy due to previous radiation of the left breast, I consider that is resonable to have bilateral mastectomy. Patient is also considering having immediate reconstruction.   I discuss with the patient the goal of the total mastectomy, I also discussed with patient the risk of surgery that includes, infection, bleeding, seroma, pain, scar, contracture, lymphadenopathy, among others.   She agree with plan. I coordinated appointment with Plastic Surgery next week.     PLAN:  1. Bilateral mastectomy with  immediate reconstruction (19303 x 2) 2. Left axillary sentinel lymph node biopsy ([id number])  Patient verbalized understanding, all questions were answered, and were agreeable with the plan outlined above.   I spent a total of 45 minutes in both face-to-face and non-face-to-face activities for this visit on the date of this encounter.   Herbert Pun, MD  Electronically signed by Herbert Pun, MD

## 2019-10-22 ENCOUNTER — Telehealth: Payer: Self-pay

## 2019-10-22 ENCOUNTER — Other Ambulatory Visit: Payer: Self-pay | Admitting: General Surgery

## 2019-10-22 DIAGNOSIS — D0512 Intraductal carcinoma in situ of left breast: Secondary | ICD-10-CM

## 2019-10-22 NOTE — Telephone Encounter (Signed)
Called pt and left a detailed message.

## 2019-10-22 NOTE — Telephone Encounter (Unsigned)
Copied from [place] [phone]. Topic: General - Other >> Oct 22, 2019 11:24 AM Celene Kras wrote: Reason for CRM: Pt called and is requesting to speak with Baxter Flattery regarding a surgery she is needing to have. Please advise.

## 2019-10-23 ENCOUNTER — Encounter: Payer: Self-pay | Admitting: Plastic Surgery

## 2019-10-23 NOTE — Progress Notes (Signed)
Surgical Clearance has been received from Dr. Nehemiah Massed, [facility], for patient's upcoming surgery Total mastectomy with breast reconstruction and placement of tissue expanders with Dr. Windell Moment and Dr. Claudia Desanctis.  Medications to hold prior to surgery Eliquis (Stop taking: 3 days prior to surgery (10/8))

## 2019-10-25 ENCOUNTER — Other Ambulatory Visit: Payer: Self-pay | Admitting: Plastic Surgery

## 2019-10-25 ENCOUNTER — Telehealth: Payer: Self-pay

## 2019-10-25 MED ORDER — HYDROCODONE-ACETAMINOPHEN 5-325 MG PO TABS
1.0000 | ORAL_TABLET | Freq: Three times a day (TID) | ORAL | 0 refills | Status: DC | PRN
Start: 2019-10-25 — End: 2019-11-02

## 2019-10-25 MED ORDER — SULFAMETHOXAZOLE-TRIMETHOPRIM 800-160 MG PO TABS: 1.0000 | ORAL_TABLET | Freq: Two times a day (BID) | ORAL | 0 refills | Status: AC

## 2019-10-25 NOTE — Telephone Encounter (Signed)
Call to pt per [place], [place]:  Surgical clearance received from Dr. Nehemiah Massed- he recommends pt stop Eliquis 3 days prior to surgery. Pt understands the plan & she states she is prepared for her procedure. She is reminded to call for any concern/changes.

## 2019-10-26 ENCOUNTER — Encounter
Admission: RE | Admit: 2019-10-26 | Discharge: 2019-10-26 | Disposition: A | Discharge status: Home / Self Care | Payer: Medicare Other | Source: Ambulatory Visit | Attending: General Surgery | Admitting: General Surgery

## 2019-10-26 ENCOUNTER — Other Ambulatory Visit: Payer: Self-pay

## 2019-10-26 NOTE — Patient Instructions (Addendum)
Your procedure is scheduled on: 11/01/19 Report to  [place] at previous given time. (8:00 AM).  Remember: Instructions that are not followed completely may result in serious medical risk, up to and including death, or upon the discretion of your surgeon and anesthesiologist your surgery may need to be rescheduled.     _X__ 1. Do not eat food after midnight the night before your procedure.                 No gum chewing or hard candies. You may drink clear liquids up to 2 hours                 before you are scheduled to arrive for your surgery- DO not drink clear                 liquids within 2 hours of the start of your surgery.                 Clear Liquids include:  water, apple juice without pulp, clear carbohydrate                 drink such as Clearfast or Gatorade, Black Coffee or Tea (Do not add                 anything to coffee or tea). Diabetics water only  __X__2.  On the morning of surgery brush your teeth with toothpaste and water, you                 may rinse your mouth with mouthwash if you wish.  Do not swallow any              toothpaste of mouthwash.     _X__ 3.  No Alcohol for 24 hours before or after surgery.   _X__ 4.  Do Not Smoke or use e-cigarettes For 24 Hours Prior to Your Surgery.                 Do not use any chewable tobacco products for at least 6 hours prior to                 surgery.  ____  5.  Bring all medications with you on the day of surgery if instructed.   __X__  6.  Notify your doctor if there is any change in your medical condition      (cold, fever, infections).     Do not wear jewelry, make-up, hairpins, clips or nail polish. Do not wear lotions, powders, or perfumes.  Do not shave 48 hours prior to surgery. Men may shave face and neck. Do not bring valuables to the hospital.    [facility] is not responsible for any belongings or valuables.  Contacts, dentures/partials or body piercings may not be worn into surgery. Bring  a case for your contacts, glasses or hearing aids, a denture cup will be supplied. Leave your suitcase in the car. After surgery it may be brought to your room. For patients admitted to the hospital, discharge time is determined by your treatment team.   Patients discharged the day of surgery will not be allowed to drive home.   Please read over the following fact sheets that you were given:   MRSA Information  __X__ Take these medicines the morning of surgery with A SIP OF WATER:    1. metoprolol succinate (TOPROL-XL) 100 MG 24 hr tablet  2. sertraline (ZOLOFT) 100 MG  tablet  3.   4.  5.  6.  ____ Fleet Enema (as directed)   __X__ Use CHG Soap/SAGE wipes as directed  ____ Use inhalers on the day of surgery  ____ Stop metformin/Janumet/Farxiga 2 days prior to surgery    ____ Take 1/2 of usual insulin dose the night before surgery. No insulin the morning          of surgery.   __X__ Stop Blood Thinners Eliquis  As instructed by your cardiologist.  Or contact your Surgeon, Cardiologist or Medical Doctor regarding  ability to stop your blood thinners  __X__ Stop Anti-inflammatories 7 days before surgery such as Advil, Ibuprofen, Motrin,  BC or Goodies Powder, Naprosyn, Naproxen, Aleve, Aspirin    __X__ Stop all herbal supplements, fish oil or vitamin E until after surgery.    ____ Bring C-Pap to the hospital.     How to Use Chlorhexidine for Bathing Chlorhexidine gluconate (CHG) is a germ-killing (antiseptic) solution that is used to clean the skin. It can get rid of the bacteria that normally live on the skin and can keep them away for about 24 hours. To clean your skin with CHG, you may be given:  A CHG solution to use in the shower or as part of a sponge bath.  A prepackaged cloth that contains CHG. Cleaning your skin with CHG may help lower the risk for infection:  While you are staying in the intensive care unit of the hospital.  If you have a vascular access, such  as a central line, to provide short-term or long-term access to your veins.  If you have a catheter to drain urine from your bladder.  If you are on a ventilator. A ventilator is a machine that helps you breathe by moving air in and out of your lungs.  After surgery. What are the risks? Risks of using CHG include:  A skin reaction.  Hearing loss, if CHG gets in your ears.  Eye injury, if CHG gets in your eyes and is not rinsed out.  The CHG product catching fire. Make sure that you avoid smoking and flames after applying CHG to your skin. Do not use CHG:  If you have a chlorhexidine allergy or have previously reacted to chlorhexidine.  On babies younger than 33 months of age. How to use CHG solution  Use CHG only as told by your health care provider, and follow the instructions on the label.  Use the full amount of CHG as directed. Usually, this is one bottle. During a shower Follow these steps when using CHG solution during a shower (unless your health care provider gives you different instructions): 1. Start the shower. 2. Use your normal soap and shampoo to wash your face and hair. 3. Turn off the shower or move out of the shower stream. 4. Pour the CHG onto a clean washcloth. Do not use any type of brush or rough-edged sponge. 5. Starting at your neck, lather your body down to your toes. Make sure you follow these instructions: ? If you will be having surgery, pay special attention to the part of your body where you will be having surgery. Scrub this area for at least 1 minute. ? Do not use CHG on your head or face. If the solution gets into your ears or eyes, rinse them well with water. ? Avoid your genital area. ? Avoid any areas of skin that have broken skin, cuts, or scrapes. ? Scrub your back and under  your arms. Make sure to wash skin folds. 6. Let the lather sit on your skin for 1-2 minutes or as long as told by your health care provider. 7. Thoroughly rinse your  entire body in the shower. Make sure that all body creases and crevices are rinsed well. 8. Dry off with a clean towel. Do not put any substances on your body afterward--such as powder, lotion, or perfume--unless you are told to do so by your health care provider. Only use lotions that are recommended by the manufacturer. 9. Put on clean clothes or pajamas. 10. If it is the night before your surgery, sleep in clean sheets.  During a sponge bath Follow these steps when using CHG solution during a sponge bath (unless your health care provider gives you different instructions): 1. Use your normal soap and shampoo to wash your face and hair. 2. Pour the CHG onto a clean washcloth. 3. Starting at your neck, lather your body down to your toes. Make sure you follow these instructions: ? If you will be having surgery, pay special attention to the part of your body where you will be having surgery. Scrub this area for at least 1 minute. ? Do not use CHG on your head or face. If the solution gets into your ears or eyes, rinse them well with water. ? Avoid your genital area. ? Avoid any areas of skin that have broken skin, cuts, or scrapes. ? Scrub your back and under your arms. Make sure to wash skin folds. 4. Let the lather sit on your skin for 1-2 minutes or as long as told by your health care provider. 5. Using a different clean, wet washcloth, thoroughly rinse your entire body. Make sure that all body creases and crevices are rinsed well. 6. Dry off with a clean towel. Do not put any substances on your body afterward--such as powder, lotion, or perfume--unless you are told to do so by your health care provider. Only use lotions that are recommended by the manufacturer. 7. Put on clean clothes or pajamas. 8. If it is the night before your surgery, sleep in clean sheets. How to use CHG prepackaged cloths  Only use CHG cloths as told by your health care provider, and follow the instructions on the  label.  Use the CHG cloth on clean, dry skin.  Do not use the CHG cloth on your head or face unless your health care provider tells you to.  When washing with the CHG cloth: ? Avoid your genital area. ? Avoid any areas of skin that have broken skin, cuts, or scrapes. Before surgery Follow these steps when using a CHG cloth to clean before surgery (unless your health care provider gives you different instructions): 1. Using the CHG cloth, vigorously scrub the part of your body where you will be having surgery. Scrub using a back-and-forth motion for 3 minutes. The area on your body should be completely wet with CHG when you are done scrubbing. 2. Do not rinse. Discard the cloth and let the area air-dry. Do not put any substances on the area afterward, such as powder, lotion, or perfume. 3. Put on clean clothes or pajamas. 4. If it is the night before your surgery, sleep in clean sheets.  For general bathing Follow these steps when using CHG cloths for general bathing (unless your health care provider gives you different instructions). 1. Use a separate CHG cloth for each area of your body. Make sure you wash between any folds  of skin and between your fingers and toes. Wash your body in the following order, switching to a new cloth after each step: ? The front of your neck, shoulders, and chest. ? Both of your arms, under your arms, and your hands. ? Your stomach and groin area, avoiding the genitals. ? Your right leg and foot. ? Your left leg and foot. ? The back of your neck, your back, and your buttocks. 2. Do not rinse. Discard the cloth and let the area air-dry. Do not put any substances on your body afterward--such as powder, lotion, or perfume--unless you are told to do so by your health care provider. Only use lotions that are recommended by the manufacturer. 3. Put on clean clothes or pajamas. Contact a health care provider if:  Your skin gets irritated after scrubbing.  You  have questions about using your solution or cloth. Get help right away if:  Your eyes become very red or swollen.  Your eyes itch badly.  Your skin itches badly and is red or swollen.  Your hearing changes.  You have trouble seeing.  You have swelling or tingling in your mouth or throat.  You have trouble breathing.  You swallow any chlorhexidine. Summary  Chlorhexidine gluconate (CHG) is a germ-killing (antiseptic) solution that is used to clean the skin. Cleaning your skin with CHG may help to lower your risk for infection.  You may be given CHG to use for bathing. It may be in a bottle or in a prepackaged cloth to use on your skin. Carefully follow your health care provider's instructions and the instructions on the product label.  Do not use CHG if you have a chlorhexidine allergy.  Contact your health care provider if your skin gets irritated after scrubbing. This information is not intended to replace advice given to you by your health care provider. Make sure you discuss any questions you have with your health care provider. Document Revised: 03/26/2018 Document Reviewed: 12/05/2016 Elsevier Patient Education  [place].

## 2019-10-27 ENCOUNTER — Ambulatory Visit (INDEPENDENT_AMBULATORY_CARE_PROVIDER_SITE_OTHER): Payer: Medicare Other | Admitting: Family Medicine

## 2019-10-27 ENCOUNTER — Encounter: Payer: Self-pay | Admitting: Family Medicine

## 2019-10-27 VITALS — BP 120/80 | HR 80 | Ht 65.5 in | Wt 223.0 lb

## 2019-10-27 DIAGNOSIS — E785 Hyperlipidemia, unspecified: Secondary | ICD-10-CM | POA: Diagnosis not present

## 2019-10-27 DIAGNOSIS — I10 Essential (primary) hypertension: Secondary | ICD-10-CM | POA: Diagnosis not present

## 2019-10-27 DIAGNOSIS — I420 Dilated cardiomyopathy: Secondary | ICD-10-CM

## 2019-10-27 DIAGNOSIS — F339 Major depressive disorder, recurrent, unspecified: Secondary | ICD-10-CM

## 2019-10-27 MED ORDER — METOPROLOL SUCCINATE ER 100 MG PO TB24: 100.0000 mg | ORAL_TABLET | Freq: Every day | ORAL | 1 refills | Status: DC

## 2019-10-27 MED ORDER — LISINOPRIL 5 MG PO TABS: 5.0000 mg | ORAL_TABLET | Freq: Every day | ORAL | 1 refills | Status: DC

## 2019-10-27 MED ORDER — HYDROCHLOROTHIAZIDE 12.5 MG PO CAPS: 12.5000 mg | ORAL_CAPSULE | Freq: Every day | ORAL | 1 refills | Status: DC

## 2019-10-27 MED ORDER — PRAVASTATIN SODIUM 20 MG PO TABS: 20.0000 mg | ORAL_TABLET | Freq: Every day | ORAL | 1 refills | Status: DC

## 2019-10-27 MED ORDER — SERTRALINE HCL 100 MG PO TABS: 100.0000 mg | ORAL_TABLET | Freq: Every day | ORAL | 1 refills | Status: DC

## 2019-10-27 NOTE — Progress Notes (Signed)
Date:  10/27/2019   Name:  Melissa Chase   DOB:  Dec 28, 1940   MRN:  [id number]   Chief Complaint: Hypertension, Hyperlipidemia, and Depression  Hypertension This is a chronic problem. The current episode started more than 1 year ago. The problem has been gradually improving since onset. The problem is controlled. Pertinent negatives include no anxiety, blurred vision, chest pain, headaches, malaise/fatigue, neck pain, orthopnea, palpitations, peripheral edema, PND, shortness of breath or sweats. Past treatments include ACE inhibitors, beta blockers and diuretics. The current treatment provides moderate improvement. There are no compliance problems.  There is no history of angina, kidney disease, CAD/MI, CVA, heart failure, left ventricular hypertrophy, PVD or retinopathy. There is no history of chronic renal disease, a hypertension causing med or renovascular disease.  Hyperlipidemia This is a chronic problem. The current episode started more than 1 year ago. The problem is controlled. Recent lipid tests were reviewed and are normal. She has no history of chronic renal disease, diabetes, hypothyroidism, liver disease, obesity or nephrotic syndrome. Pertinent negatives include no chest pain, myalgias or shortness of breath. Current antihyperlipidemic treatment includes statins.  Depression        This is a chronic problem.  The current episode started more than 1 year ago.   The problem has been gradually improving since onset.  Associated symptoms include no decreased concentration, no fatigue, no helplessness, no hopelessness, does not have insomnia, not irritable, no restlessness, no decreased interest, no appetite change, no body aches, no myalgias, no headaches, no indigestion, not sad and no suicidal ideas.     The symptoms are aggravated by medication.  Past treatments include SSRIs - Selective serotonin reuptake inhibitors.  Compliance with treatment is variable.  Previous treatment  provided moderate relief.   Pertinent negatives include no hypothyroidism and no anxiety.   Lab Results  Component Value Date   CREATININE 0.91 09/23/2019   BUN 16 09/23/2019   NA 144 09/23/2019   K 4.0 09/23/2019   CL 106 09/23/2019   CO2 28 09/23/2019   Lab Results  Component Value Date   CHOL 193 05/26/2019   HDL 84 05/26/2019   LDLCALC 90 05/26/2019   TRIG 94 05/26/2019   CHOLHDL 2.3 05/26/2019   Lab Results  Component Value Date   TSH 2.210 09/03/2018   Lab Results  Component Value Date   HGBA1C 5.4 01/01/2013   Lab Results  Component Value Date   WBC 4.5 09/23/2019   HGB 13.0 09/23/2019   HCT 38.3 09/23/2019   MCV 92.1 09/23/2019   PLT 141 (L) 09/23/2019   Lab Results  Component Value Date   ALT 16 09/23/2019   AST 26 09/23/2019   ALKPHOS 29 (L) 09/23/2019   BILITOT 0.6 09/23/2019     Review of Systems  Constitutional: Negative.  Negative for appetite change, chills, fatigue, fever, malaise/fatigue and unexpected weight change.  HENT: Negative for congestion, ear discharge, ear pain, rhinorrhea, sinus pressure, sneezing and sore throat.   Eyes: Negative for blurred vision, photophobia, pain, discharge, redness and itching.  Respiratory: Negative for cough, shortness of breath, wheezing and stridor.   Cardiovascular: Negative for chest pain, palpitations, orthopnea and PND.  Gastrointestinal: Negative for abdominal pain, blood in stool, constipation, diarrhea, nausea and vomiting.  Endocrine: Negative for cold intolerance, heat intolerance, polydipsia, polyphagia and polyuria.  Genitourinary: Negative for dysuria, flank pain, frequency, hematuria, menstrual problem, pelvic pain, urgency, vaginal bleeding and vaginal discharge.  Musculoskeletal: Negative for arthralgias, back pain,  myalgias and neck pain.  Skin: Negative for rash.  Allergic/Immunologic: Negative for environmental allergies and food allergies.  Neurological: Negative for dizziness,  weakness, light-headedness, numbness and headaches.  Hematological: Negative for adenopathy. Does not bruise/bleed easily.  Psychiatric/Behavioral: Positive for depression. Negative for decreased concentration, dysphoric mood and suicidal ideas. The patient is not nervous/anxious and does not have insomnia.     Patient Active Problem List   Diagnosis Date Noted  . Obesity (BMI 30-39.9) 10/26/2018  . 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 Goals of care, counseling/discussion 09/18/2017  . Sacroiliitis ([place]) 02/04/2017  . Dilated cardiomyopathy ([place]) 05/02/2016  . Bradycardia 04/04/2016  . A-fib ([place]) 03/20/2016  . Acute dyspnea 02/20/2016  . 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 Arthritis 06/06/2014  . Aggrieved 06/06/2014  . Dermatitis, eczematoid 06/06/2014  . Recurrent major depressive episodes ([place]) 06/06/2014  . Essential (primary) hypertension 06/06/2014  . Gastro-esophageal reflux disease without esophagitis 06/06/2014  . Cannot sleep 06/06/2014  . Menopause 06/06/2014    No Known Allergies  Past Surgical History:  Procedure Laterality Date  . ABDOMINAL HYSTERECTOMY    . 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 BREAST BIOPSY Left 10/12/2019   affirm bx of calcs, x marker, path pending  . 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 BREAST LUMPECTOMY Right 07/09/2017   DCIS, clear margins  . BREAST LUMPECTOMY Left 2006   DCIS  . BREAST LUMPECTOMY WITH NEEDLE LOCALIZATION Right 07/09/2017   Procedure: BREAST LUMPECTOMY WITH NEEDLE LOCALIZATION;  Surgeon: Herbert Pun, MD;  Location: [facility] ORS;  Service: General;  Laterality: Right;  . BREAST SURGERY     left breast lumpectomy  . CARDIOVERSION N/A 03/21/2016   Procedure: Cardioversion;  Surgeon: Corey Skains, MD;  Location: [facility] ORS;  Service:  Cardiovascular;  Laterality: N/A;  . CATARACT EXTRACTION, BILATERAL    . LAPAROSCOPIC CHOLECYSTECTOMY    . TONSILLECTOMY    . VAGINAL HYSTERECTOMY      Social History   Tobacco Use  . Smoking status: Never Smoker  . Smokeless tobacco: Never Used  Vaping Use  . Vaping Use: Never used  Substance Use Topics  . Alcohol use: Never  . Drug use: No     Medication list has been reviewed and updated.  Current Meds  Medication Sig  . alendronate (FOSAMAX) 70 MG tablet TAKE 1 TABLET BY MOUTH ONCE A WEEK. TAKE WITH A FULL GLASS OF WATER ON AN EMPTY STOMACH. (Patient taking differently: Take 70 mg by mouth once a week. )  . apixaban (ELIQUIS) 5 MG TABS tablet Take 5 mg by mouth 2 (two) times daily.   . calcium-vitamin D (OSCAL WITH D) 500-200 MG-UNIT TABS tablet Take 1 tablet by mouth 2 (two) times daily.   . celecoxib (CELEBREX) 200 MG capsule Take 200 mg by mouth daily.  . hydrochlorothiazide (MICROZIDE) 12.5 MG capsule Take 1 capsule (12.5 mg total) by mouth daily.  Marland Kitchen lisinopril (ZESTRIL) 5 MG tablet Take 1 tablet (5 mg total) by mouth daily.  . metoprolol succinate (TOPROL-XL) 100 MG 24 hr tablet Take 1 tablet (100 mg total) by mouth daily. Take with or immediately following a meal.  . pravastatin (PRAVACHOL) 20 MG tablet Take 1 tablet (20 mg total) by mouth daily. (Patient taking differently: Take 20 mg by mouth at bedtime. )  . sertraline (ZOLOFT) 100 MG tablet  Take 1 tablet (100 mg total) by mouth daily.  . tamoxifen (NOLVADEX) 20 MG tablet TAKE 1 TABLET BY MOUTH EVERY DAY (Patient taking differently: Take 20 mg by mouth daily. )  . zinc gluconate 50 MG tablet Take 50 mg by mouth daily.    PHQ 2/9 Scores 10/27/2019 08/19/2019 05/25/2019 02/25/2019  PHQ - 2 Score 0 0 0 0  PHQ- 9 Score 0 0 0 0    GAD 7 : Generalized Anxiety Score 10/27/2019 08/19/2019 05/25/2019 02/25/2019  Nervous, Anxious, on Edge 0 0 0 1  Control/stop worrying 0 0 0 0  Worry too much - different things 0 0 0 0  Trouble  relaxing 0 0 0 0  Restless 0 0 0 0  Easily annoyed or irritable 0 0 0 0  Afraid - awful might happen 0 0 0 0  Total GAD 7 Score 0 0 0 1  Anxiety Difficulty - - - Not difficult at all    BP Readings from Last 3 Encounters:  10/27/19 120/80  09/28/19 120/80  09/23/19 112/65    Physical Exam Vitals and nursing note reviewed.  Constitutional:      General: She is not irritable.She is not in acute distress.    Appearance: She is not diaphoretic.  HENT:     Head: Normocephalic and atraumatic.     Right Ear: External ear normal.     Left Ear: External ear normal.     Nose: Nose normal.  Eyes:     General:        Right eye: No discharge.        Left eye: No discharge.     Conjunctiva/sclera: Conjunctivae normal.     Pupils: Pupils are equal, round, and reactive to light.  Neck:     Thyroid: No thyromegaly.     Vascular: No JVD.  Cardiovascular:     Rate and Rhythm: Normal rate and regular rhythm.     Chest Wall: PMI is not displaced.     Pulses: Normal pulses.     Heart sounds: Normal heart sounds, S1 normal and S2 normal. No murmur heard.  No systolic murmur is present.  No friction rub. No gallop. No S3 or S4 sounds.   Pulmonary:     Effort: Pulmonary effort is normal.     Breath sounds: Normal breath sounds. No wheezing, rhonchi or rales.  Abdominal:     General: Bowel sounds are normal.     Palpations: Abdomen is soft. There is no mass.     Tenderness: There is no abdominal tenderness. There is no guarding.  Musculoskeletal:        General: Normal range of motion.     Cervical back: Normal range of motion and neck supple.     Right lower leg: No edema.     Left lower leg: No edema.  Lymphadenopathy:     Cervical: No cervical adenopathy.  Skin:    General: Skin is warm and dry.  Neurological:     Mental Status: She is alert.     Deep Tendon Reflexes: Reflexes are normal and symmetric.     Wt Readings from Last 3 Encounters:  10/27/19 223 lb (101.2 kg)    10/26/19 222 lb (100.7 kg)  10/21/19 222 lb 6.4 oz (100.9 kg)    BP 120/80   Pulse 80   Ht 5' 5.5" (1.664 m)   Wt 223 lb (101.2 kg)   BMI 36.54 kg/m   Assessment and Plan:  1. Essential hypertension Chronic.  Controlled.  Stable.  Blood pressure is 120/80 continue hydrochlorothiazide 12.5 mg once a day, lisinopril 5 mg once a day, and metoprolol XL 100 mg once a day. - hydrochlorothiazide (MICROZIDE) 12.5 MG capsule; Take 1 capsule (12.5 mg total) by mouth daily.  Dispense: 90 capsule; Refill: 1 - lisinopril (ZESTRIL) 5 MG tablet; Take 1 tablet (5 mg total) by mouth daily.  Dispense: 90 tablet; Refill: 1 - metoprolol succinate (TOPROL-XL) 100 MG 24 hr tablet; Take 1 tablet (100 mg total) by mouth daily. Take with or immediately following a meal.  Dispense: 90 tablet; Refill: 1  2. Dilated cardiomyopathy (HCC) Chronic.  Controlled.  Stable.  Patient is followed by Dr. Nehemiah Massed.  We will continue hydrochlorothiazide, metoprolol, and lisinopril.  Patient is currently stable for surgery as far as cardiac concerns today. - hydrochlorothiazide (MICROZIDE) 12.5 MG capsule; Take 1 capsule (12.5 mg total) by mouth daily.  Dispense: 90 capsule; Refill: 1 - lisinopril (ZESTRIL) 5 MG tablet; Take 1 tablet (5 mg total) by mouth daily.  Dispense: 90 tablet; Refill: 1 - metoprolol succinate (TOPROL-XL) 100 MG 24 hr tablet; Take 1 tablet (100 mg total) by mouth daily. Take with or immediately following a meal.  Dispense: 90 tablet; Refill: 1  3. Dyslipidemia Chronic.  Controlled.  Stable.  Continue pravastatin 20 mg once a day.  Reviewed previous lipid panel and is stable. - pravastatin (PRAVACHOL) 20 MG tablet; Take 1 tablet (20 mg total) by mouth daily.  Dispense: 90 tablet; Refill: 1  4. Recurrent major depressive episodes (HCC) Chronic.  Controlled.  Stable.  PHQ is 0.  Gad score is 0.  Continue sertraline 100 mg 1 a day.  Will recheck in 6 months. - sertraline (ZOLOFT) 100 MG tablet; Take 1  tablet (100 mg total) by mouth daily.  Dispense: 90 tablet; Refill: 1

## 2019-10-28 ENCOUNTER — Other Ambulatory Visit: Payer: Self-pay

## 2019-10-28 ENCOUNTER — Other Ambulatory Visit
Admission: RE | Admit: 2019-10-28 | Discharge: 2019-10-28 | Disposition: A | Discharge status: Home / Self Care | Payer: Medicare Other | Source: Ambulatory Visit | Attending: General Surgery | Admitting: General Surgery

## 2019-10-28 ENCOUNTER — Other Ambulatory Visit: Payer: Medicare Other

## 2019-10-28 DIAGNOSIS — I10 Essential (primary) hypertension: Secondary | ICD-10-CM | POA: Diagnosis not present

## 2019-10-28 DIAGNOSIS — Z01812 Encounter for preprocedural laboratory examination: Secondary | ICD-10-CM | POA: Insufficient documentation

## 2019-10-28 DIAGNOSIS — Z20822 Contact with and (suspected) exposure to covid-19: Secondary | ICD-10-CM | POA: Diagnosis not present

## 2019-10-28 LAB — SARS CORONAVIRUS 2 (TAT 6-24 HRS): SARS Coronavirus 2: NEGATIVE

## 2019-10-28 NOTE — Progress Notes (Signed)
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 Clinical nursing staff has updated patient's PMH/PSHx, current medication list, and drug allergies/intolerances to ensure comprehensive history available to assist in medical decision making as it pertains to the aforementioned surgical procedure and anticipated anesthetic course.   Clinical Discussion:  Melissa Chase is a 79 y.o. female who is submitted for pre-surgical anesthesia review and clearance prior to her undergoing the above procedure. Patient has never been a smoker. Pertinent PMH includes: dilated cardiomyopathy, A. fib, valvular insufficiency, HTN, HLD, pulmonary hypertension, GERD (not on daily PPI), OA, insomnia, depression  Patient is followed by cardiology Nehemiah Massed, MD). She was last seen in the cardiology clinic on 09/08/2019; notes reviewed.  At the time of the clinic visit, patient was "feeling fine".  She denied any acute physical concerns.  Patient denied chest pain, shortness of breath, PND, orthopnea, peripheral edema, palpitations, vertiginous symptoms, and presyncope/syncope patient making efforts to become more physically active.  DASI documented as being greater than 4 METS; patient climbs stairs  and walks briskly without angina/anginal equivalent symptoms.  Patient is on GDMT for her hypertension; takes beta-blocker, thiazide diuretic, and ACEi as prescribed.  Patient is also on a statin for her HLD.  TTE in 01/2019 revealed mild LV systolic dysfunction with an estimated EF of 40-45%; moderate valvular insufficiency. Exercise stress test also done in 02/10/2019 revealed no evidence of stress-induced ischemia or arrhythmia.  MPI study from 02/2016 revealed an LVEF of 38% with no evidence of myocardial ischemia (see full interpretation of cardiovascular testing below). CHA2DS2-VASc Score = 4-5.  Patient on chronic anticoagulation using apixaban; notes compliance.  Patient scheduled to follow-up with outpatient cardiology in 6 months.  Recommendations are for repeat TTE in 01/2020 for surveillance related to patient's known valvular disease.  Patient has recently undergone a stereotactic core needle biopsy of a mass discovered in her LEFT breast.  Biopsy (+) for malignancy.  Patient was seen in consult by general surgery for evaluation of biopsy-proven ER (+) high-grade DCIS with comedonecrosis.  Decision was made to pursue BILATERAL total mastectomy with immediate breast reconstruction on 11/01/2019 with Dr. Windell Moment and Dr. Claudia Desanctis.  Given patient's past medical history that is significant for cardiovascular issues, presurgical cardiac and internal medicine clearance was sought by the performing surgeon's office.  According to cardiology, patient is "optimized for surgery and may proceed with planned procedure with a LOW risk stratification".  Patient was also seen by her PCP Ronnald Ramp, MD) on 10/27/2019.  In review of the PCP notes, physician indicates overall medical stability, and notes that patient is "currently stable to proceed with surgery".  Again, patient is chronically anticoagulated using apixaban.  Patient has been instructed on recommendations from cardiology to hold apixaban for 3 days prior to  procedure and  to restart as soon as safely possible postoperatively.  Patient verbalizes understanding that her last dose of anticoagulant will be on 10/28/2019.  She denies previous perioperative complications with anesthesia. She underwent a general anesthetic course here (ASA III) in 06/2017 with no documented complications.   Vitals with BMI 10/27/2019 10/26/2019 10/21/2019  Height 5' 5.5" 5' 5.5" 5' 5.5"  Weight 223 lbs 222 lbs 222 lbs 6 oz  BMI 36.53 53.61 44.31  Systolic 540 - (No Data)  Diastolic 80 - (No Data)  Pulse 80 - 67    Providers/Specialists:   NOTE: Primary physician provider listed below. Patient may have been seen by APP or partner within same practice.   PROVIDER ROLE LAST Tanna Savoy, MD General Surgery 10/21/2019  Juline Patch, MD Primary Care Provider 10/27/2019  Serafina Royals, MD Cardiology 09/08/2019   Allergies:  Patient has no known allergies.  Current Home Medications:   No current facility-administered medications for this encounter.   Marland Kitchen alendronate (FOSAMAX) 70 MG tablet  . apixaban (ELIQUIS) 5 MG TABS tablet  . calcium-vitamin D (OSCAL WITH D) 500-200 MG-UNIT TABS tablet  . celecoxib (CELEBREX) 200 MG capsule  . tamoxifen (NOLVADEX) 20 MG tablet  . zinc gluconate 50 MG tablet  . hydrochlorothiazide (MICROZIDE) 12.5 MG capsule  . HYDROcodone-acetaminophen (NORCO) 5-325 MG tablet  . lisinopril (ZESTRIL) 5 MG tablet  . metoprolol succinate (TOPROL-XL) 100 MG 24 hr tablet  . pravastatin (PRAVACHOL) 20 MG tablet  . sertraline (ZOLOFT) 100 MG tablet  . sulfamethoxazole-trimethoprim (BACTRIM DS) 800-160 MG tablet   History:   Past Medical History:  Diagnosis Date  . Afib ([place])   . Arthritis   . Breast cancer ([place]) 2006   left breast  . Breast cancer ([place]) 2019   right breast  . Depression   . GERD (gastroesophageal reflux disease)   . Hypertension   . Insomnia   . Insomnia   . Personal history of radiation therapy 2006    36 tx.  . Personal history of radiation therapy 2019   Past Surgical History:  Procedure Laterality Date  . ABDOMINAL HYSTERECTOMY    . 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 BREAST BIOPSY Left 10/12/2019   affirm bx of calcs, x marker, path pending  . 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 BREAST LUMPECTOMY Right 07/09/2017   DCIS, clear margins  . BREAST LUMPECTOMY Left 2006   DCIS  . BREAST LUMPECTOMY WITH NEEDLE LOCALIZATION Right 07/09/2017   Procedure: BREAST LUMPECTOMY WITH NEEDLE LOCALIZATION;  Surgeon: Herbert Pun, MD;  Location: [facility] ORS;  Service: General;  Laterality: Right;  . BREAST SURGERY     left breast lumpectomy  . CARDIOVERSION N/A 03/21/2016   Procedure: Cardioversion;  Surgeon: Corey Skains, MD;  Location: [facility] ORS;  Service: Cardiovascular;  Laterality: N/A;  . CATARACT EXTRACTION, BILATERAL    . LAPAROSCOPIC CHOLECYSTECTOMY    . TONSILLECTOMY    . VAGINAL HYSTERECTOMY     Family History  Problem Relation Age of Onset  . Diabetes Sister   . Breast cancer Daughter        49's   Social History   Tobacco Use  . Smoking status: Never Smoker  . Smokeless tobacco: Never Used  Vaping Use  . Vaping Use: Never used  Substance Use Topics  . Alcohol use: Never  . Drug use: No    Pertinent Clinical Results:  LABS: Labs reviewed: Acceptable for surgery.  Hospital Outpatient Visit on 10/12/2019  Component Date Value Ref Range Status  . SURGICAL PATHOLOGY 10/12/2019    Final-Edited                   Value:SURGICAL PATHOLOGY THIS IS AN ADDENDUM REPORT CASE: [id number] PATIENT: Melissa Chase Surgical Pathology Report *Addendum*  Reason for Addendum #1:  Breast Biomarker Results  Specimen Submitted: A. Left breast, upper outer; stereo biopsy  Clinical History: 79 yo f with upper outer left breast calcifications. Hx of bilateral breast cancers and lumpectomies.  X-shaped clip placed following stereotactic biopsy of LEFT breast, upper outer quadrant.    DIAGNOSIS: A. BREAST, LEFT UPPER OUTER QUADRANT; STEREOTACTIC CORE NEEDLE BIOPSY: - DUCTAL CARCINOMA IN SITU, HIGH-GRADE, WITH COMEDONECROSIS, WITH ASSOCIATED CALCIFICATIONS. - NEGATIVE FOR INVASIVE CARCINOMA.  Comment: DCIS involves 2 of multiple tissue cores, and measures a span of at least 1.1 mm in greatest dimension.  Multiple additional deeper recut levels were examined to ensure visualization of calcifications.  Ancillary testing for estrogen receptor is pending, and will be reported as an addendum.   GROSS DESCRIPTION: A. Labeled: Left breast stereo biopsy calcs upper outer Received: in a formalin-filled [place] collection device Specimen radiograph image(s) available for review Time/Date in fixative: Collected at 8:24 AM on 10/12/2019 and placed in formalin at 8:28 AM on 10/12/2019 Cold ischemic time: Less than 5 minutes Total fixation time: 9 hours Core pieces: Multiple Measurement: Aggregate, 3.7 x 1.4 x 0.2 cm Description / comments: Received are cores and fragments of yellow fibrofatty tissue. Inked: Black Entirely submitted in cassette(s):  1 - section A 2 - section F 3 - section G 4 - section K 5 - 8 - remaining tissue fragments      5 - sections B and C      6 - sections D and E      7 - sections H and I      8 - sections J and L  Final Diagnosis performed by Allena Napoleon, MD.   Electronically signed 10/13/2019 2:52:14PM The electronic signature indicates that the named Attending Pathologist has evaluated the specimen  Appointment on 09/23/2019  Component Date Value Ref Range Status  . Sodium 09/23/2019 144  135 - 145 mmol/L Final  . Potassium 09/23/2019 4.0  3.5 - 5.1 mmol/L Final  . Chloride 09/23/2019 106  98 - 111 mmol/L Final  . CO2 09/23/2019 28  22 - 32 mmol/L Final  . Glucose, Bld 09/23/2019 128* 70 - 99 mg/dL Final   Glucose reference range applies  only to samples taken after fasting for at least 8 hours.  . BUN 09/23/2019 16  8 - 23 mg/dL Final  . Creatinine, Ser 09/23/2019 0.91  0.44 - 1.00 mg/dL Final  . Calcium 09/23/2019 8.8* 8.9 - 10.3 mg/dL Final  . Total Protein 09/23/2019 6.7  6.5 - 8.1 g/dL Final  . Albumin 09/23/2019 3.9  3.5 - 5.0 g/dL Final  . AST 09/23/2019 26  15 - 41 U/L Final  . ALT 09/23/2019 16  0 - 44 U/L Final  . Alkaline Phosphatase 09/23/2019 29* 38 - 126 U/L Final  . Total Bilirubin 09/23/2019 0.6  0.3 - 1.2 mg/dL Final  . GFR calc non Af Amer 09/23/2019 >60  >60 mL/min Final  . GFR calc Af Amer 09/23/2019 >60  >60 mL/min Final  . Anion gap 09/23/2019 10  5 - 15 Final   Performed at [facility],  [address] [place], [place] [address]  . WBC 09/23/2019 4.5  4.0 - 10.5 K/uL Final  . RBC 09/23/2019 4.16  3.87 - 5.11 MIL/uL Final  . Hemoglobin 09/23/2019 13.0  12.0 - 15.0 g/dL Final  . HCT 09/23/2019 38.3  36 - 46 % Final  . MCV 09/23/2019 92.1  80.0 - 100.0 fL Final  . MCH 09/23/2019 31.3  26.0 - 34.0 pg Final  . MCHC 09/23/2019 33.9  30.0 - 36.0 g/dL Final  . RDW 09/23/2019 12.6  11.5 - 15.5 % Final  . Platelets 09/23/2019 141* 150 - 400 K/uL Final  . nRBC 09/23/2019 0.0  0.0 - 0.2 % Final  . Neutrophils Relative % 09/23/2019 55  % Final  . Neutro Abs 09/23/2019 2.5  1.7 - 7.7 K/uL Final  . Lymphocytes Relative 09/23/2019 32  % Final  . Lymphs Abs 09/23/2019 1.5  0.7 - 4.0 K/uL Final  . Monocytes Relative 09/23/2019 9  % Final  . Monocytes Absolute 09/23/2019 0.4  0.1 - 1.0 K/uL Final  . Eosinophils Relative 09/23/2019 3  % Final  . Eosinophils Absolute 09/23/2019 0.1  0 - 0 K/uL Final  . Basophils Relative 09/23/2019 1  % Final  . Basophils Absolute 09/23/2019 0.0  0 - 0 K/uL Final  . Immature Granulocytes 09/23/2019 0  % Final  . Abs Immature Granulocytes 09/23/2019 0.01  0.00 - 0.07 K/uL Final   Performed at [facility], [address] [place], [place] [address]    ECG: Date:  10/28/2019 Time ECG obtained: 1041 AM Rate: 60 bpm Rhythm: normal sinus Axis (leads I and aVF): Normal Intervals: PR 186 ms. QRS 86 ms. QTc 448 ms. ST segment and T wave changes: No evidence of acute ST segment elevation or depression Comparison: No significant changes noted from previous tracing done on 07/03/2017.   IMAGING / PROCEDURES: ECHOCARDIOGRAM done on 02/08/2019 1. LVEF 45% 2. Mild LV systolic dysfunction 3. Normal RV systolic function 4. Moderate tricuspid and mitral valve insufficiency 5. Mild aortic valve insufficiency 6. No valvular stenosis 7. Mild biatrial enlargement 8. Mild LV enlargement 9. Mild RV enlargement 10. Moderate pulmonary hypertension  EXERCISE STRESS TEST done on 02/04/2019 1. METS 7.0 2. Resting VS: heart rate 70 bpm and blood pressure 142/78 mmHg 3. Stress VS: Heart rate 125 (87% maximal age-predicted heart rate) and blood pressure 190/80 mmHg. 4. No chest pain, arrhythmias, or ST changes noted during the study 5. Normal treadmill ECG without evidence of ischemia or arrhythmia  LEXISCAN done on 02/26/2016 1. LVEF 38% 2. Global hypokinesis of all of the myocardial walls 3. Overall quality of study is fair 4. No artifacts noted 5. Left ventricular cavity normal  Impression and Plan:  Melissa Chase has been referred for pre-anesthesia review and clearance prior to her undergoing the planned anesthetic and procedural courses. Available labs, pertinent testing, and imaging results were personally reviewed by me. This patient has been appropriately cleared by internal medicine Ronnald Ramp, MD and cardiology Nehemiah Massed, MD).  Based on clinical review performed today (10/28/19), barring any significant acute changes in the patient's overall condition, it is anticipated that she will be able to proceed with the planned surgical intervention. Any acute changes in clinical condition may necessitate her procedure being postponed and/or cancelled. Pre-surgical  instructions were reviewed with the patient during her PAT appointment and questions were fielded by PAT clinical staff.  Honor Loh, MSN, APRN, FNP-C, CEN [place]  Peri-operative Services Nurse  Practitioner Phone: [phone] 10/28/19 2:39 PM  NOTE: This note has been prepared using Dragon dictation software. Despite my best ability to proofread, there is always the potential that unintentional transcriptional errors may still occur from this process.

## 2019-10-31 MED ORDER — LACTATED RINGERS IV SOLN: INTRAVENOUS | Status: DC

## 2019-10-31 MED ORDER — FAMOTIDINE 20 MG PO TABS: 20.0000 mg | ORAL_TABLET | Freq: Once | ORAL | Status: AC

## 2019-10-31 MED ORDER — CEFAZOLIN SODIUM-DEXTROSE 2-4 GM/100ML-% IV SOLN
2.0000 g | INTRAVENOUS | Status: AC
  Administered 2019-11-01: 2 g via INTRAVENOUS

## 2019-10-31 MED ORDER — ORAL CARE MOUTH RINSE: 15.0000 mL | Freq: Once | OROMUCOSAL | Status: AC

## 2019-10-31 MED ORDER — CHLORHEXIDINE GLUCONATE 0.12 % MT SOLN: 15.0000 mL | Freq: Once | OROMUCOSAL | Status: AC

## 2019-11-01 ENCOUNTER — Other Ambulatory Visit: Payer: Self-pay

## 2019-11-01 ENCOUNTER — Observation Stay
Admission: RE | Admit: 2019-11-01 | Discharge: 2019-11-02 | Disposition: A | Discharge status: Home / Self Care | Payer: Medicare Other | Attending: General Surgery | Admitting: General Surgery

## 2019-11-01 ENCOUNTER — Ambulatory Visit: Payer: Medicare Other | Admitting: Family Medicine

## 2019-11-01 ENCOUNTER — Encounter: Payer: Self-pay | Admitting: General Surgery

## 2019-11-01 ENCOUNTER — Ambulatory Visit: Payer: Medicare Other | Admitting: Urgent Care

## 2019-11-01 ENCOUNTER — Encounter
Admission: RE | Disposition: A | Discharge status: Home / Self Care | Payer: Self-pay | Source: Home / Self Care | Attending: General Surgery

## 2019-11-01 ENCOUNTER — Encounter
Admission: RE | Admit: 2019-11-01 | Discharge: 2019-11-01 | Disposition: A | Discharge status: Home / Self Care | Payer: Medicare Other | Source: Ambulatory Visit | Attending: General Surgery | Admitting: General Surgery

## 2019-11-01 ENCOUNTER — Ambulatory Visit
Admission: RE | Admit: 2019-11-01 | Discharge: 2019-11-01 | Disposition: A | Discharge status: Home / Self Care | Payer: Medicare Other | Source: Ambulatory Visit | Attending: General Surgery | Admitting: General Surgery

## 2019-11-01 DIAGNOSIS — D0512 Intraductal carcinoma in situ of left breast: Secondary | ICD-10-CM

## 2019-11-01 DIAGNOSIS — D0511 Intraductal carcinoma in situ of right breast: Secondary | ICD-10-CM | POA: Diagnosis not present

## 2019-11-01 DIAGNOSIS — Z6830 Body mass index (BMI) 30.0-30.9, adult: Secondary | ICD-10-CM | POA: Diagnosis not present

## 2019-11-01 DIAGNOSIS — Z7901 Long term (current) use of anticoagulants: Secondary | ICD-10-CM | POA: Diagnosis not present

## 2019-11-01 DIAGNOSIS — I10 Essential (primary) hypertension: Secondary | ICD-10-CM | POA: Insufficient documentation

## 2019-11-01 DIAGNOSIS — E669 Obesity, unspecified: Secondary | ICD-10-CM | POA: Diagnosis not present

## 2019-11-01 DIAGNOSIS — C50919 Malignant neoplasm of unspecified site of unspecified female breast: Secondary | ICD-10-CM | POA: Diagnosis present

## 2019-11-01 DIAGNOSIS — K219 Gastro-esophageal reflux disease without esophagitis: Secondary | ICD-10-CM | POA: Diagnosis not present

## 2019-11-01 DIAGNOSIS — C50912 Malignant neoplasm of unspecified site of left female breast: Secondary | ICD-10-CM | POA: Diagnosis not present

## 2019-11-01 DIAGNOSIS — Z79899 Other long term (current) drug therapy: Secondary | ICD-10-CM | POA: Insufficient documentation

## 2019-11-01 HISTORY — PX: APPLICATION OF WOUND VAC: SHX5189

## 2019-11-01 HISTORY — PX: BREAST RECONSTRUCTION WITH PLACEMENT OF TISSUE EXPANDER AND FLEX HD (ACELLULAR HYDRATED DERMIS): SHX6295

## 2019-11-01 HISTORY — PX: TOTAL MASTECTOMY: SHX6129

## 2019-11-01 SURGERY — MASTECTOMY, SIMPLE
Anesthesia: General | Site: Breast | Laterality: Bilateral

## 2019-11-01 MED ORDER — PHENYLEPHRINE HCL (PRESSORS) 10 MG/ML IV SOLN
INTRAVENOUS | Status: DC | PRN
  Administered 2019-11-01 (×2): 100 ug via INTRAVENOUS

## 2019-11-01 MED ORDER — ONDANSETRON HCL 4 MG/2ML IJ SOLN: 4.0000 mg | Freq: Four times a day (QID) | INTRAMUSCULAR | Status: DC | PRN

## 2019-11-01 MED ORDER — SUCCINYLCHOLINE CHLORIDE 20 MG/ML IJ SOLN
INTRAMUSCULAR | Status: DC | PRN
  Administered 2019-11-01: 120 mg via INTRAVENOUS

## 2019-11-01 MED ORDER — ACETAMINOPHEN 325 MG PO TABS: 650.0000 mg | ORAL_TABLET | Freq: Four times a day (QID) | ORAL | Status: DC | PRN

## 2019-11-01 MED ORDER — ENOXAPARIN SODIUM 40 MG/0.4ML ~~LOC~~ SOLN: 40.0000 mg | SUBCUTANEOUS | Status: DC

## 2019-11-01 MED ORDER — PROPOFOL 10 MG/ML IV BOLUS
INTRAVENOUS | Status: DC | PRN
  Administered 2019-11-01: 130 mg via INTRAVENOUS

## 2019-11-01 MED ORDER — HYDROCODONE-ACETAMINOPHEN 5-325 MG PO TABS
1.0000 | ORAL_TABLET | ORAL | Status: DC | PRN
  Administered 2019-11-01 – 2019-11-02 (×2): 2 via ORAL
  Filled 2019-11-01 (×2): qty 2

## 2019-11-01 MED ORDER — ROCURONIUM BROMIDE 100 MG/10ML IV SOLN
INTRAVENOUS | Status: DC | PRN
  Administered 2019-11-01: 10 mg via INTRAVENOUS
  Administered 2019-11-01: 20 mg via INTRAVENOUS

## 2019-11-01 MED ORDER — LIDOCAINE HCL (CARDIAC) PF 100 MG/5ML IV SOSY
PREFILLED_SYRINGE | INTRAVENOUS | Status: DC | PRN
  Administered 2019-11-01: 100 mg via INTRAVENOUS

## 2019-11-01 MED ORDER — HYDROCHLOROTHIAZIDE 12.5 MG PO CAPS: 12.5000 mg | ORAL_CAPSULE | Freq: Every day | ORAL | Status: DC

## 2019-11-01 MED ORDER — METHYLENE BLUE 0.5 % INJ SOLN
INTRAVENOUS | Status: DC | PRN
  Administered 2019-11-01: 4 mL

## 2019-11-01 MED ORDER — FENTANYL CITRATE (PF) 100 MCG/2ML IJ SOLN
INTRAMUSCULAR | Status: DC | PRN
Start: 2019-11-01 — End: 2019-11-01
  Administered 2019-11-01 (×2): 50 ug via INTRAVENOUS

## 2019-11-01 MED ORDER — METOPROLOL SUCCINATE ER 100 MG PO TB24: 100.0000 mg | ORAL_TABLET | Freq: Every day | ORAL | Status: DC

## 2019-11-01 MED ORDER — SULFAMETHOXAZOLE-TRIMETHOPRIM 800-160 MG PO TABS
1.0000 | ORAL_TABLET | Freq: Two times a day (BID) | ORAL | Status: DC
  Administered 2019-11-01: 1 via ORAL
  Filled 2019-11-01 (×2): qty 1

## 2019-11-01 MED ORDER — MORPHINE SULFATE (PF) 4 MG/ML IV SOLN: 4.0000 mg | INTRAVENOUS | Status: DC | PRN

## 2019-11-01 MED ORDER — SODIUM CHLORIDE 0.9 % IV SOLN
INTRAVENOUS | Status: DC | PRN
  Administered 2019-11-01: 100 mL

## 2019-11-01 MED ORDER — BUPIVACAINE-EPINEPHRINE (PF) 0.5% -1:200000 IJ SOLN: INTRAMUSCULAR | Status: DC | PRN

## 2019-11-01 MED ORDER — INDOCYANINE GREEN 25 MG IV SOLR
INTRAVENOUS | Status: DC | PRN
  Administered 2019-11-01 (×2): 7.5 mg via INTRAVENOUS

## 2019-11-01 MED ORDER — DEXAMETHASONE SODIUM PHOSPHATE 10 MG/ML IJ SOLN
INTRAMUSCULAR | Status: DC | PRN
  Administered 2019-11-01: 5 mg via INTRAVENOUS

## 2019-11-01 MED ORDER — CELECOXIB 200 MG PO CAPS
200.0000 mg | ORAL_CAPSULE | Freq: Every day | ORAL | Status: DC
  Filled 2019-11-01: qty 1

## 2019-11-01 MED ORDER — ZINC SULFATE 220 (50 ZN) MG PO CAPS
220.0000 mg | ORAL_CAPSULE | Freq: Every day | ORAL | Status: DC
  Filled 2019-11-01: qty 1

## 2019-11-01 MED ORDER — SODIUM CHLORIDE 0.9 % IV SOLN
INTRAVENOUS | Status: DC | PRN
  Administered 2019-11-01: 1000 mL

## 2019-11-01 MED ORDER — PROPOFOL 10 MG/ML IV BOLUS
INTRAVENOUS | Status: AC
  Filled 2019-11-01: qty 20

## 2019-11-01 MED ORDER — PANTOPRAZOLE SODIUM 40 MG IV SOLR
40.0000 mg | Freq: Every day | INTRAVENOUS | Status: DC
  Administered 2019-11-01: 40 mg via INTRAVENOUS
  Filled 2019-11-01: qty 40

## 2019-11-01 MED ORDER — FENTANYL CITRATE (PF) 100 MCG/2ML IJ SOLN: 25.0000 ug | INTRAMUSCULAR | Status: DC | PRN

## 2019-11-01 MED ORDER — CALCIUM CARBONATE-VITAMIN D 500-200 MG-UNIT PO TABS
1.0000 | ORAL_TABLET | Freq: Two times a day (BID) | ORAL | Status: DC
  Administered 2019-11-01: 21:00:00 1 via ORAL
  Filled 2019-11-01: qty 1

## 2019-11-01 MED ORDER — SODIUM CHLORIDE 0.9 % IV SOLN: INTRAVENOUS | Status: DC

## 2019-11-01 MED ORDER — SERTRALINE HCL 50 MG PO TABS: 100.0000 mg | ORAL_TABLET | Freq: Every day | ORAL | Status: DC

## 2019-11-01 MED ORDER — ONDANSETRON HCL 4 MG/2ML IJ SOLN: 4.0000 mg | Freq: Once | INTRAMUSCULAR | Status: DC | PRN

## 2019-11-01 MED ORDER — ONDANSETRON HCL 4 MG/2ML IJ SOLN
INTRAMUSCULAR | Status: DC | PRN
  Administered 2019-11-01: 4 mg via INTRAVENOUS

## 2019-11-01 MED ORDER — FAMOTIDINE 20 MG PO TABS
ORAL_TABLET | ORAL | Status: AC
  Administered 2019-11-01: 20 mg via ORAL
  Filled 2019-11-01: qty 1

## 2019-11-01 MED ORDER — ACETAMINOPHEN 650 MG RE SUPP: 650.0000 mg | Freq: Four times a day (QID) | RECTAL | Status: DC | PRN

## 2019-11-01 MED ORDER — ZINC GLUCONATE 50 MG PO TABS: 50.0000 mg | ORAL_TABLET | Freq: Every day | ORAL | Status: DC

## 2019-11-01 MED ORDER — LIDOCAINE HCL (PF) 2 % IJ SOLN
INTRAMUSCULAR | Status: AC
  Filled 2019-11-01: qty 5

## 2019-11-01 MED ORDER — PHENYLEPHRINE HCL-NACL 20-0.9 MG/250ML-% IV SOLN
INTRAVENOUS | Status: DC | PRN
  Administered 2019-11-01: 45 ug/min via INTRAVENOUS

## 2019-11-01 MED ORDER — EPHEDRINE SULFATE 50 MG/ML IJ SOLN
INTRAMUSCULAR | Status: DC | PRN
  Administered 2019-11-01: 10 mg via INTRAVENOUS
  Administered 2019-11-01: 5 mg via INTRAVENOUS
  Administered 2019-11-01 (×2): 10 mg via INTRAVENOUS

## 2019-11-01 MED ORDER — PRAVASTATIN SODIUM 20 MG PO TABS: 20.0000 mg | ORAL_TABLET | Freq: Every day | ORAL | Status: DC

## 2019-11-01 MED ORDER — TAMOXIFEN CITRATE 10 MG PO TABS
20.0000 mg | ORAL_TABLET | Freq: Every day | ORAL | Status: DC
  Filled 2019-11-01 (×2): qty 2

## 2019-11-01 MED ORDER — LISINOPRIL 10 MG PO TABS: 5.0000 mg | ORAL_TABLET | Freq: Every day | ORAL | Status: DC

## 2019-11-01 MED ORDER — CHLORHEXIDINE GLUCONATE 0.12 % MT SOLN
OROMUCOSAL | Status: AC
  Administered 2019-11-01: 15 mL via OROMUCOSAL
  Filled 2019-11-01: qty 15

## 2019-11-01 MED ORDER — FENTANYL CITRATE (PF) 100 MCG/2ML IJ SOLN
INTRAMUSCULAR | Status: AC
  Filled 2019-11-01: qty 2

## 2019-11-01 MED ORDER — TECHNETIUM TC 99M SULFUR COLLOID FILTERED
1.0000 | Freq: Once | INTRAVENOUS | Status: AC | PRN
  Administered 2019-11-01: 0.415 via INTRADERMAL

## 2019-11-01 MED ORDER — ONDANSETRON 4 MG PO TBDP: 4.0000 mg | ORAL_TABLET | Freq: Four times a day (QID) | ORAL | Status: DC | PRN

## 2019-11-01 SURGICAL SUPPLY — 90 items
BINDER BREAST LRG (GAUZE/BANDAGES/DRESSINGS) IMPLANT
BINDER BREAST MEDIUM (GAUZE/BANDAGES/DRESSINGS) IMPLANT
BINDER BREAST XLRG (GAUZE/BANDAGES/DRESSINGS) IMPLANT
BIOPATCH WHT 1IN DISK W/4.0 H (GAUZE/BANDAGES/DRESSINGS) IMPLANT
BLADE BOVIE TIP EXT 4 (BLADE) ×4 IMPLANT
BLADE SURG 15 STRL LF DISP TIS (BLADE) ×3 IMPLANT
BLADE SURG 15 STRL SS (BLADE) ×1
BNDG ELASTIC 6X5.8 VLCR NS LF (GAUZE/BANDAGES/DRESSINGS) ×8 IMPLANT
BULB RESERV EVAC DRAIN JP 100C (MISCELLANEOUS) ×8 IMPLANT
CANISTER SUCT 1200ML W/VALVE (MISCELLANEOUS) ×8 IMPLANT
CHLORAPREP W/TINT 26 (MISCELLANEOUS) ×12 IMPLANT
COVER WAND RF STERILE (DRAPES) ×8 IMPLANT
DERMABOND ADVANCED (GAUZE/BANDAGES/DRESSINGS) ×3
DERMABOND ADVANCED .7 DNX12 (GAUZE/BANDAGES/DRESSINGS) ×9 IMPLANT
DEVICE DSSCT PLSMBLD 3.0S LGHT (MISCELLANEOUS) ×3 IMPLANT
DRAIN CHANNEL JP 15F RND 16 (MISCELLANEOUS) ×8 IMPLANT
DRAPE 3/4 80X56 (DRAPES) ×8 IMPLANT
DRAPE CHEST BREAST 77X106 FENE (MISCELLANEOUS) ×8 IMPLANT
DRAPE LAPAROTOMY 77X122 PED (DRAPES) IMPLANT
DRAPE LAPAROTOMY TRNSV 106X77 (MISCELLANEOUS) ×4 IMPLANT
DRAPE UTILITY 15X26 TOWEL STRL (DRAPES) IMPLANT
DRESSING PRVNA BELLAFORM 24X22 (GAUZE/BANDAGES/DRESSINGS) ×3 IMPLANT
DRSG GAUZE FLUFF 36X18 (GAUZE/BANDAGES/DRESSINGS) IMPLANT
DRSG PREVENA BELLAFORM 24X22 (GAUZE/BANDAGES/DRESSINGS) ×4
ELECT CAUTERY BLADE TIP 2.5 (TIP) ×4
ELECT REM PT RETURN 9FT ADLT (ELECTROSURGICAL) ×8
ELECTRODE CAUTERY BLDE TIP 2.5 (TIP) ×3 IMPLANT
ELECTRODE REM PT RTRN 9FT ADLT (ELECTROSURGICAL) ×6 IMPLANT
EXPANDER TISSUE CPX4 550CC (Expander) ×8 IMPLANT
GAUZE SPONGE 4X4 12PLY STRL (GAUZE/BANDAGES/DRESSINGS) ×8 IMPLANT
GLOVE BIO SURGEON STRL SZ 6.5 (GLOVE) ×4 IMPLANT
GLOVE BIOGEL M STRL SZ7.5 (GLOVE) ×8 IMPLANT
GLOVE BIOGEL PI IND STRL 6.5 (GLOVE) ×3 IMPLANT
GLOVE BIOGEL PI IND STRL 8 (GLOVE) ×3 IMPLANT
GLOVE BIOGEL PI INDICATOR 6.5 (GLOVE) ×1
GLOVE BIOGEL PI INDICATOR 8 (GLOVE) ×1
GOWN STRL REUS W/ TWL LRG LVL3 (GOWN DISPOSABLE) ×18 IMPLANT
GOWN STRL REUS W/TWL LRG LVL3 (GOWN DISPOSABLE) ×6
GRAFT FLEX HD 19X22X0.7-1.4 (Tissue) ×8 IMPLANT
IV NS 1000ML (IV SOLUTION) ×1
IV NS 1000ML BAXH (IV SOLUTION) ×3 IMPLANT
IV NS 500ML (IV SOLUTION) ×1
IV NS 500ML BAXH (IV SOLUTION) ×3 IMPLANT
KIT TURNOVER KIT A (KITS) ×8 IMPLANT
LABEL OR SOLS (LABEL) ×4 IMPLANT
MARGIN MAP 10MM (MISCELLANEOUS) ×4 IMPLANT
MARKER SKIN DUAL TIP RULER LAB (MISCELLANEOUS) ×4 IMPLANT
NDL SAFETY ECLIPSE 18X1.5 (NEEDLE) ×3 IMPLANT
NEEDLE 21 GA WING INFUSION (NEEDLE) ×4 IMPLANT
NEEDLE HYPO 18GX1.5 SHARP (NEEDLE) ×1
NS IRRIG 1000ML POUR BTL (IV SOLUTION) ×4 IMPLANT
PACK BASIN MAJOR ARMC (MISCELLANEOUS) ×8 IMPLANT
PACK SPY-PHI (KITS) ×4 IMPLANT
PAD ABD DERMACEA PRESS 5X9 (GAUZE/BANDAGES/DRESSINGS) IMPLANT
PAD PREP 24X41 OB/GYN DISP (PERSONAL CARE ITEMS) IMPLANT
PIN SAFETY STRL (MISCELLANEOUS) IMPLANT
PLASMABLADE 3.0S W/LIGHT (MISCELLANEOUS) ×4
RETRACTOR WOUND ALXS 18CM SML (MISCELLANEOUS) IMPLANT
RTRCTR WOUND ALEXIS O 18CM SML (MISCELLANEOUS)
SET ASEPTIC TRANSFER (MISCELLANEOUS) ×4 IMPLANT
SLEVE PROBE SENORX GAMMA FIND (MISCELLANEOUS) ×4 IMPLANT
SPONGE LAP 18X18 RF (DISPOSABLE) ×12 IMPLANT
STAPLER INSORB 30 2030 C-SECTI (MISCELLANEOUS) ×4 IMPLANT
STRIP CLOSURE SKIN 1/2X4 (GAUZE/BANDAGES/DRESSINGS) IMPLANT
SUT ETHILON 3-0 FS-10 30 BLK (SUTURE) ×12
SUT ETHILON 4-0 (SUTURE)
SUT ETHILON 4-0 FS2 18XMFL BLK (SUTURE)
SUT MNCRL 4-0 (SUTURE)
SUT MNCRL 4-0 27XMFL (SUTURE)
SUT PDS II 3-0 (SUTURE) ×8 IMPLANT
SUT PDS PLUS 2 (SUTURE) ×6
SUT PDS PLUS AB 2-0 CT-1 (SUTURE) ×18 IMPLANT
SUT PROLENE 2 0 SH DA (SUTURE) ×4 IMPLANT
SUT SILK 2 0 SH (SUTURE) ×4 IMPLANT
SUT SILK 3-0 (SUTURE) IMPLANT
SUT V-LOC 90 ABS 3-0 VLT  V-20 (SUTURE) ×2
SUT V-LOC 90 ABS 3-0 VLT V-20 (SUTURE) ×6 IMPLANT
SUT VIC AB 3-0 SH 27 (SUTURE) ×1
SUT VIC AB 3-0 SH 27X BRD (SUTURE) ×3 IMPLANT
SUT VIC AB 3-0 SH 8-18 (SUTURE) ×4 IMPLANT
SUT VICRYL+ 3-0 144IN (SUTURE) IMPLANT
SUTURE EHLN 3-0 FS-10 30 BLK (SUTURE) ×9 IMPLANT
SUTURE ETHLN 4-0 FS2 18XMF BLK (SUTURE) IMPLANT
SUTURE MNCRL 4-0 27XMF (SUTURE) IMPLANT
SWABSTK COMLB BENZOIN TINCTURE (MISCELLANEOUS) IMPLANT
SYR 20ML LL LF (SYRINGE) ×8 IMPLANT
SYR 5ML LL (SYRINGE) IMPLANT
SYR BULB IRRIG 60ML STRL (SYRINGE) ×8 IMPLANT
TRAY FOLEY SLVR 16FR LF STAT (SET/KITS/TRAYS/PACK) ×4 IMPLANT
WATER STERILE IRR 1000ML POUR (IV SOLUTION) ×4 IMPLANT

## 2019-11-01 NOTE — Discharge Instructions (Signed)
Activity As tolerated: NO showers for 3 days No heavy activities  Diet: Regular  Wound Care: No showers. Keep dressing clean & dry until after your post-op visit.Marland Kitchen  Keep wrap applied with compression 24/7.  Call doctor if any unusual problems occur such as pain, excessive bleeding, unrelieved nausea/vomiting, fever &/or chills  Follow-up appointment: Scheduled for next week.   [facility]                      How to care for your drainage and suction unit at home Your drainage catheter will be connected to a Jackson-Pratt (JP) bulb that is a round and soft collection device.   The vacuum created when the device is compressed allows drainage to collect in the container.    Attach the JP bulb to your clothes with a safety pin to prevent tension on your skin and help prevent the tube from dislodging prematurely.    To compress the Jackson-Pratt Bulb:  . Release the plug at the top of the bulb (emptying port) . Squeeze the bulb tightly in your fist, squeezing air out of the bulb . Replace the plug while the bulb is compressed . Be careful not to spill the contents when squeezing the bulb  *The bulb must be compressed to have suction and initiate drainage *  To empty the collection device:  .  Marland Kitchen Release the plug on the top of the collection unit (emptying port) . Pour the fluid into a measuring container such as a plastic medicine cup . Record the date and the amount of drainage emptied.  This should be done at routine times every day      Please bring this record with you to each office visit Date Amount of Drainage                                                            Contact your physician if any of the following occur: Marland Kitchen Drain or incisional area becomes red, swollen or hot to the touch. . You develop a fever greater than 100 degrees F. . Oozing at the skin insertion site - you may apply a gauze dressing. Melissa Chase falls out -  apply gauze dressing over the drain insertion site. . Drainage is increasing daily unrelated to activity pattern.  Note: You will usually have more drainage as your activity increases.

## 2019-11-01 NOTE — Anesthesia Procedure Notes (Signed)
Procedure Name: Intubation Performed by: Fredderick Phenix, CRNA Pre-anesthesia Checklist: Patient identified, Emergency Drugs available, Suction available and Patient being monitored Patient Re-evaluated:Patient Re-evaluated prior to induction Oxygen Delivery Method: Circle system utilized Preoxygenation: Pre-oxygenation with 100% oxygen Induction Type: IV induction Ventilation: Mask ventilation without difficulty Laryngoscope Size: Mac and 4 Grade View: Grade II Tube type: Oral Tube size: 7.0 mm Number of attempts: 1 Airway Equipment and Method: Stylet and Oral airway Placement Confirmation: ETT inserted through vocal cords under direct vision,  positive ETCO2 and breath sounds checked- equal and bilateral Secured at: 21 cm Tube secured with: Tape Dental Injury: Teeth and Oropharynx as per pre-operative assessment

## 2019-11-01 NOTE — Op Note (Addendum)
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   Procedure: Bilateral total mastectomy.                      Left axillary node dissection  Anesthesia: GETA  Surgeon: Dr. Windell Moment  Wound Classification: Clean  Indications: Patient is a 79 y.o. female who had an abnormal mammogram that on workup with core needle biopsy was found to be ductal carcinoma in situ. After discussion of alternatives, the patient elected total bilateral mastectomy due to previous history of DCIS on both breast.  Findings: 1. Small area of induration on the skin of the superior flap. Pathology unable to report is this was extension of the DCIS. Re excision of the superior flap done by plastic surgeon.  2. No radioactive signal or methylene blue identified on the left axillary area. Axillary node dissection was needed to be done.   Description of procedure: The patient was brought to the operating room and general anesthesia was induced. A time-out was completed verifying correct patient, procedure, site, positioning, and implant(s) and/or special equipment prior to beginning this procedure. I personally infected methylene blue around the nipple areola complex. The breast, chest wall, axilla, and upper arm and neck were prepped and draped in the usual sterile fashion.  A skin incision was made on the left breast that encompassed the nipple-areola complex and the previous biopsy scar and passed in an oblique direction across the breast. Flaps were raised in the avascular plane between subcutaneous tissue and breast tissue from the clavicle superiorly, the sternum medially, the anterior rectus sheath inferiorly, and past the lateral border of the pectoralis major muscle laterally. Hemostasis was achieved in the flaps. Next, the breast tissue and underlying pectoralis fascia were excised from the pectoralis  major muscle, progressing from medially to laterally. At the lateral border of the pectoralis major muscle, the breast tissue was swung laterally and a lateral pedicle identified where breast tissue gave way to fat of axilla. The lateral pedicle was incised and the specimen removed.  There was an area of induration that extended from the biopsy site to the skin. This area was marked with a suture.  A hand-held gamma probe was used to identify the location of the hottest spot in the axilla. No significant count was identified. There was no methylene blue on the axillary tissue either. The Axillary vein was identified.  The pectoral muscles were retracted medially with a Richardson retractor. The medial pectoral neurovascular bundle was identified and preserved. The level II nodal tissue deep in the pectoralis minor was included in the dissection. The axillary vein was then identified and cleared of overlying fat. The first branch off the axillary vein was clamped, divided, and tied with 2-0 silk ties. The thoracodorsal nerve was then identified deep to the ligated vein and preserved. The long thoracic nerve was then identified along the edge of the latissimus dorsi on the chest wall and preserved. The remaining nodal tissue between these nerves was then carefully removed, taking care to protect the nerves. The specimen, consisting of breast and attached nodal tissue, was then excised by dividing the remaining lateral pedical and sent to pathology.  The wound was irrigated and hemostasis was achieved.   A total mastectomy of the right side was done using the same fashion. No axillary dissection was done on the right side. The wounds were left open  for immediate reconstruction done by Plastic and Reconstructive surgery.  Axillary Lymph Node Dissection Synoptic Operative Report  Operation performed with curative intent:Yes  Resection was performed within the boundaries of the axillary vein, chest wall  (serratus anterior), and latissimus dorsi:Yes  Nerves identified and preserved during dissection (select all that apply):Long Thoracic nerve and Thoracodorsal nerve  Level III nodes were removed:No    Specimen: Left Breast                     Left axillary tissue                      Right breast   Complications: None  Estimated Blood Loss: 50 mL

## 2019-11-01 NOTE — Anesthesia Preprocedure Evaluation (Signed)
Anesthesia Evaluation  Patient identified by MRN, date of birth, ID band Patient awake    Reviewed: Allergy & Precautions, NPO status , Patient's Chart, lab work & pertinent test results  History of Anesthesia Complications Negative for: history of anesthetic complications  Airway Mallampati: III  TM Distance: <3 FB     Dental  (+) Caps, Dental Advidsory Given, Teeth Intact   Pulmonary neg pulmonary ROS,    Pulmonary exam normal        Cardiovascular hypertension, Pt. on medications and Pt. on home beta blockers (-) angina+CHF  (-) Past MI and (-) Cardiac Stents Normal cardiovascular exam+ dysrhythmias Atrial Fibrillation (-) Valvular Problems/Murmurs     Neuro/Psych PSYCHIATRIC DISORDERS Depression negative neurological ROS     GI/Hepatic Neg liver ROS, GERD  Medicated and Controlled,  Endo/Other  negative endocrine ROS  Renal/GU negative Renal ROS  negative genitourinary   Musculoskeletal  (+) Arthritis , Osteoarthritis,    Abdominal Normal abdominal exam  (+)   Peds negative pediatric ROS (+)  Hematology negative hematology ROS (+)   Anesthesia Other Findings Past Medical History: No date: Afib ([place]) No date: Arthritis 2006: Breast cancer ([place])     Comment:  left breast No date: Depression No date: GERD (gastroesophageal reflux disease) No date: Hypertension No date: Insomnia No date: Insomnia 2006: Personal history of radiation therapy     Comment:  36 tx  EF 45%  Reproductive/Obstetrics negative OB ROS                             Anesthesia Physical  Anesthesia Plan  ASA: III  Anesthesia Plan: General   Post-op Pain Management:    Induction: Intravenous  PONV Risk Score and Plan: 3 and Ondansetron, Dexamethasone and Treatment may vary due to age or medical condition  Airway Management Planned: Oral ETT and LMA  Additional Equipment:   Intra-op Plan:    Post-operative Plan: Extubation in OR  Informed Consent: I have reviewed the patients History and Physical, chart, labs and discussed the procedure including the risks, benefits and alternatives for the proposed anesthesia with the patient or authorized representative who has indicated his/her understanding and acceptance.     Dental advisory given  Plan Discussed with: CRNA and Surgeon  Anesthesia Plan Comments:         Anesthesia Quick Evaluation

## 2019-11-01 NOTE — Interval H&P Note (Signed)
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  The various methods of treatment have been discussed with the patient and family. After consideration of risks, benefits and other options for treatment, the patient has consented to  Procedure(s): TOTAL MASTECTOMY w/ immediate reconstruction (Bilateral) and Left axillary Sentinel Node Biopsy  BREAST RECONSTRUCTION WITH PLACEMENT OF TISSUE EXPANDER AND FLEX HD (ACELLULAR HYDRATED DERMIS) (Bilateral) as a surgical intervention.  The patient's history has been reviewed, patient examined, no change in status, stable for surgery.  I have reviewed the patient's chart and labs.  Questions were answered to the patient's satisfaction.     Herbert Pun

## 2019-11-01 NOTE — Op Note (Signed)
Operative Note   DATE OF OPERATION: 11/01/2019  SURGICAL DEPARTMENT: Plastic Surgery  PREOPERATIVE DIAGNOSES: Bilateral mastectomy defects  POSTOPERATIVE DIAGNOSES:  same  PROCEDURE: 1.  Bilateral breast reconstruction with tissue expanders and acellular dermal matrix 2.  Indocyanine green angiography bilateral mastectomy flaps 3.  Incisional wound VAC application to bilateral chest  SURGEON: Talmadge Coventry, MD  ASSISTANT: Phoebe Sharps, PA The advanced practice practitioner (APP) assisted throughout the case.  The APP was essential in retraction and counter traction when needed to make the case progress smoothly.  This retraction and assistance made it possible to see the tissue plans for the procedure.  The assistance was needed for blood control, tissue re-approximation and assisted with closure of the incision site.  ANESTHESIA:  General.   COMPLICATIONS: None.   INDICATIONS FOR PROCEDURE:  The patient, Melissa Chase is a 79 y.o. 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  She is elected to undergo bilateral skin sparing mastectomies with immediate reconstruction. MRN: [id number]  CONSENT:  Informed consent was obtained directly from the patient. Risks, benefits and alternatives were fully discussed. Specific risks including but not limited to bleeding, infection, hematoma, seroma, scarring, pain, contracture, asymmetry, wound healing problems, and need for further surgery were all discussed. The patient did have an ample opportunity to have questions answered to satisfaction.   DESCRIPTION OF PROCEDURE:  The patient was taken to the operating room. SCDs were placed and antibiotics were given.  General anesthesia was administered.  The patient's operative site was prepped and draped in a sterile fashion. A time out was performed and all information was confirmed to be correct.  Dr. Windell Moment performed his portion of the  case with bilateral mastectomies.  After he had finished he turned the patient over to me.  I started by performing the indocyanine green angiography.  Overall the flaps look very healthy with a 1 x 3 cm dark area on the lateral aspect on the right and a 2 x 4 cm dark area superiorly on the left.  Coincidentally the area of the concern on the left corresponded with a palpable nodularity that was of clinical concern.  Both areas were excised sharply with a 15 blade and cautery.  The one on the left was marked with a Prolene suture in the area of the clinical nodularity and a long nylon suture medially.  Meticulous hemostasis was then obtained bilaterally.  JP drains were placed on both sides and secured with nylon sutures.  Both pockets were irrigated copiously with triple antibiotic solution.  At this point the acellular dermal matrix was brought onto the field.  2 pieces of Flex HD pliable premedium size were brought onto the field and soaked in saline.  I chose a 550 cc Mentor CPX for smooth tissue expander.  For the right side the serial number was [id number].  For the left side the serial number was [id number].  A 2-0 PDS suture was run around the periphery of the matrix and the deflated tissue expander was placed into the center and the suture was tied down.  Suture tabs were brought out the matrix corresponding to inferior and lateral for the side that they would be put on.  I then focused my attention on the right side.  I ensured again meticulous hemostasis.  I then placed 2-0 PDS stay sutures corresponding to the inferior and lateral suture tabs.  The expander was then brought close to the  field and the sutures were passed through the suture tabs and the expander was placed into the wound and the sutures were tied down.  200 cc of saline was then placed into the expander which generated no tension on the skin.  The skin was partially closed with combination of 3-0 PDS and an sorb staples.  I then  focused my attention on the left side.  I ensured to get meticulous hemostasis.  2 PDS stay sutures were placed corresponding to inferior and lateral suture tabs.  They were looped through the expander suture tabs and tied down to secure the expander to the chest wall.  200 cc of saline was then placed into this expander which generated no tension on the skin.  The skin was partially closed with a combination of 3-0 PDS in an sorb staples.  Indocyanine green angiography was performed again showing good filling of both mastectomy flaps after being closed.  Final closure was then performed with a running 3 OV lock suture.  The Bella wound VAC was then brought onto the field and placed with great seal.  Chest was then wrapped with an Ace wrap.  The patient tolerated the procedure well.  There were no complications. The patient was allowed to wake from anesthesia, extubated and taken to the recovery room in satisfactory condition.

## 2019-11-01 NOTE — Transfer of Care (Signed)
Immediate Anesthesia Transfer of Care Note  Patient: Melissa Chase  Procedure(s) Performed: TOTAL MASTECTOMY w/ immediate reconstruction and Sentinel Node Biopsy (Bilateral Breast) INDOCYANINE GREEN FLUORESCENCE IMAGING (ICG) APPLICATION OF WOUND VAC BREAST RECONSTRUCTION WITH PLACEMENT OF TISSUE EXPANDER AND FLEX HD (ACELLULAR HYDRATED DERMIS) (Bilateral )  Patient Location: PACU  Anesthesia Type:General  Level of Consciousness: awake and alert   Airway & Oxygen Therapy: Patient Spontanous Breathing and Patient connected to face mask oxygen  Post-op Assessment: Report given to RN and Post -op Vital signs reviewed and stable  Post vital signs: Reviewed and stable  Last Vitals:  Vitals Value Taken Time  BP 101/45 11/01/19 1347  Temp    Pulse 69 11/01/19 1349  Resp 19 11/01/19 1349  SpO2 100 % 11/01/19 1349  Vitals shown include unvalidated device data.  Last Pain:  Vitals:   11/01/19 1347  PainSc: (P) Asleep         Complications: No complications documented.

## 2019-11-02 ENCOUNTER — Encounter: Payer: Self-pay | Admitting: General Surgery

## 2019-11-02 DIAGNOSIS — Z79899 Other long term (current) drug therapy: Secondary | ICD-10-CM | POA: Diagnosis not present

## 2019-11-02 DIAGNOSIS — Z7901 Long term (current) use of anticoagulants: Secondary | ICD-10-CM | POA: Diagnosis not present

## 2019-11-02 DIAGNOSIS — D0512 Intraductal carcinoma in situ of left breast: Secondary | ICD-10-CM | POA: Diagnosis not present

## 2019-11-02 DIAGNOSIS — I10 Essential (primary) hypertension: Secondary | ICD-10-CM | POA: Diagnosis not present

## 2019-11-02 MED ORDER — HYDROCODONE-ACETAMINOPHEN 5-325 MG PO TABS
1.0000 | ORAL_TABLET | Freq: Three times a day (TID) | ORAL | 0 refills | Status: AC | PRN
Start: 2019-11-02 — End: 2019-11-09

## 2019-11-02 NOTE — Anesthesia Postprocedure Evaluation (Signed)
Anesthesia Post Note  Patient: Cerena Baine Azzarello  Procedure(s) Performed: TOTAL MASTECTOMY w/ immediate reconstruction and Sentinel Node Biopsy (Bilateral Breast) INDOCYANINE GREEN FLUORESCENCE IMAGING (ICG) APPLICATION OF WOUND VAC BREAST RECONSTRUCTION WITH PLACEMENT OF TISSUE EXPANDER AND FLEX HD (ACELLULAR HYDRATED DERMIS) (Bilateral )  Patient location during evaluation: PACU Anesthesia Type: General Level of consciousness: awake and alert Pain management: pain level controlled Vital Signs Assessment: post-procedure vital signs reviewed and stable Respiratory status: spontaneous breathing, nonlabored ventilation, respiratory function stable and patient connected to nasal cannula oxygen Cardiovascular status: blood pressure returned to baseline and stable Postop Assessment: no apparent nausea or vomiting Anesthetic complications: no   No complications documented.   Last Vitals:  Vitals:   11/02/19 0546 11/02/19 0753  BP: (!) 117/46 (!) 105/49  Pulse: (!) 58 60  Resp: 16 20  Temp: 36.7 C 36.5 C  SpO2: 99% 99%    Last Pain:  Vitals:   11/02/19 0753  TempSrc: Oral  PainSc:                  Martha Clan

## 2019-11-02 NOTE — Discharge Summary (Signed)
Patient ID: Melissa Chase MRN: [id number] DOB/AGE: 09/21/1940 79 y.o.  Admit date: 11/01/2019 Discharge date: 11/02/2019   Discharge Diagnoses:  Active Problems:   Breast cancer [facility])   Procedures: Left modified radical mastectomy                        Right total mastectomy                        Bilateral immediate reconstruction with tissue expander  Hospital Course: Patient admitted for observation after bilateral mastectomy with reconstruction.  She tolerated the procedure well.  Drain with adequate output.  Patient ambulating.  Patient tolerating diet.  Wound closure by negative pressure dressing therapy.  Physical Exam Constitutional:      Appearance: Normal appearance.  Cardiovascular:     Rate and Rhythm: Normal rate and regular rhythm.  Pulmonary:     Effort: Pulmonary effort is normal.  Abdominal:     General: Abdomen is flat. Bowel sounds are normal.  Neurological:     Mental Status: She is alert.     Consults: None  Disposition: Discharge disposition: 01-Home or Self Care       Discharge Instructions    Diet - low sodium heart healthy   Complete by: As directed    Increase activity slowly   Complete by: As directed      Allergies as of 11/02/2019   No Known Allergies     Medication List    TAKE these medications   alendronate 70 MG tablet Commonly known as: FOSAMAX TAKE 1 TABLET BY MOUTH ONCE A WEEK. TAKE WITH A FULL GLASS OF WATER ON AN EMPTY STOMACH. What changed:   how much to take  how to take this  when to take this  additional instructions   calcium-vitamin D 500-200 MG-UNIT Tabs tablet Commonly known as: OSCAL WITH D Take 1 tablet by mouth 2 (two) times daily.   celecoxib 200 MG capsule Commonly known as: CELEBREX Take 200 mg by mouth daily.   Eliquis 5 MG Tabs tablet Generic drug: apixaban Take 5 mg by mouth 2 (two) times daily.   hydrochlorothiazide 12.5 MG capsule Commonly known as: MICROZIDE Take 1  capsule (12.5 mg total) by mouth daily.   HYDROcodone-acetaminophen 5-325 MG tablet Commonly known as: Norco Take 1 tablet by mouth every 8 (eight) hours as needed for up to 7 days for severe pain. For use AFTER Surgery   lisinopril 5 MG tablet Commonly known as: ZESTRIL Take 1 tablet (5 mg total) by mouth daily.   metoprolol succinate 100 MG 24 hr tablet Commonly known as: TOPROL-XL Take 1 tablet (100 mg total) by mouth daily. Take with or immediately following a meal.   pravastatin 20 MG tablet Commonly known as: PRAVACHOL Take 1 tablet (20 mg total) by mouth daily.   sertraline 100 MG tablet Commonly known as: ZOLOFT Take 1 tablet (100 mg total) by mouth daily.   sulfamethoxazole-trimethoprim 800-160 MG tablet Commonly known as: BACTRIM DS Take 1 tablet by mouth 2 (two) times daily for 14 days. For use AFTER surgery   tamoxifen 20 MG tablet Commonly known as: NOLVADEX TAKE 1 TABLET BY MOUTH EVERY DAY   zinc gluconate 50 MG tablet Take 50 mg by mouth daily.       Follow-up Information    Herbert Pun, MD Follow up in 2 week(s).   Specialty: General Surgery Contact information: [address]  [place] [address] [phone]

## 2019-11-05 LAB — SURGICAL PATHOLOGY

## 2019-11-09 ENCOUNTER — Telehealth: Payer: Self-pay

## 2019-11-09 NOTE — Telephone Encounter (Signed)
Received call from patient's daughter, [name]. She states her mom was complaining of pain and discomfort due to the bandage placed after bilateral expander and wound vac placement on 11/01/2019. When [name] loosened the bandage she noticed skin breakdown and wants to know if she can remove the bandage or loosen it and then rewrap. They do not feel that this can wait until their post op tomorrow, 11/10/19 as she is in extreme discomfort.

## 2019-11-09 NOTE — Telephone Encounter (Signed)
Returned pt's daughter's call:   Regarding her concern with the following:  an open/red area on her back which she identified as a "loss of skin" of approx 2 in. It is mid back area- which is under the ace wrap/dressing. She states it has minimal bleeding/drainage & is painful. Pt has not needed any pain med RX or OTC pain relief. Pt did have temp of 100'/ear for 2 days after surgery but this has since resolved - temp is 98.7 now She denied any chills/headache/no n/v Although she has decreased appetite but the family is encouraging more food & fluids-supplementing with protein shakes. (+) bowel/bladder function- she is using OTC Miralax Her daughter did note that she continues to have episodes of "mental fogginess/forgeting dates/time" since day of surgery but this is slowly resolving. She is oriented to person/place & day of week. Her daughter [name] states that she is still somewhat "weak/unsteady" on her feet & they are providing support when she is walking/standing. Pt has no complaint of pain at the surgical sites & the surgical wound vac dressing is intact Drainage=  (left) 80- 120 ml/24hr (right) 40-60 ml/24hr I instructed [name] to unwrap the ace bandage & apply vaseline & gauze pad to wound on her back- then re-wrap for compression. I reminded her to protect her safety while walking/standing while she is "weak/unsteady" Pt has a f/u tomorrow 11/10/19 with Dr. Claudia Desanctis- but I will forward these notes for review.

## 2019-11-10 ENCOUNTER — Telehealth: Payer: Self-pay

## 2019-11-10 ENCOUNTER — Other Ambulatory Visit: Payer: Self-pay

## 2019-11-10 ENCOUNTER — Ambulatory Visit (INDEPENDENT_AMBULATORY_CARE_PROVIDER_SITE_OTHER): Payer: Medicare Other | Admitting: Plastic Surgery

## 2019-11-10 ENCOUNTER — Encounter: Payer: Self-pay | Admitting: Plastic Surgery

## 2019-11-10 VITALS — BP 96/66 | HR 62 | Temp 98.5°F

## 2019-11-10 DIAGNOSIS — C50912 Malignant neoplasm of unspecified site of left female breast: Secondary | ICD-10-CM

## 2019-11-10 NOTE — Progress Notes (Signed)
Patient presents 1 week postop from bilateral mastectomy with implant-based reconstruction.  She is doing fine and is not having much pain.  Her incisional vacs were removed revealing some bruising of the mastectomy flaps.  There are some epidermal sloughing on the right side but none on the left side yet.  The drains are putting out somewhere between 30 and 50 cc/day so I will plan to leave these in for another week.  I do not see any obvious other issues.  We will plan to cover the skin with Xeroform and continue the wrap.  I will see her again next week.

## 2019-11-10 NOTE — Telephone Encounter (Signed)
Faxed order supplies to [facility]: ABD pads, 4x4 guaze, xeroform, ace wrap to be changed daily

## 2019-11-15 DIAGNOSIS — Z9013 Acquired absence of bilateral breasts and nipples: Secondary | ICD-10-CM | POA: Insufficient documentation

## 2019-11-15 NOTE — Progress Notes (Addendum)
Patient is a 79 year old female who underwent bilateral mastectomies with Dr. Windell Moment and placement of tissue expanders and Flex HD with Dr. Claudia Desanctis on 11/01/2019. At last visit on 10/20, she had some mild skin sloughing on the right side. She was started on daily dressing changes of xeroform and continuing the compression wrap.  ~ 2 weeks PO Patient is accompanied by her daughter today and reports she has not been eating due to nausea and vomiting. Reports she saw Dr. Peyton Najjar- Ferrel Logan yesterday and was given zofran. Reports the zofran has been helping with the nausea. Reports pain at the drain sites; right>> left. Drain output has be 20-35 cc per day since 10/24.  Upon exam there is still skin sloughing and darkening of the tissue around the incisions on bilateral breasts; left>> right. Total wound size: left 12 x 5.5 cm; right 12 x 3 cm. She also has an area of skin breakdown her right side posterior to the drain site.  Incisions at this time are still intact.  There is no sign of seroma.  Left breast skin is more taut over the expander than the right.  Bilateral drains were removed today.  Place Vaseline and gauze over drain sites until closed.  35 cc of saline was removed from the left expander using sterile technique to take some tension off the incision and darkened area of skin.  Do daily dressing changes of thin layer of Vaseline covered by Xeroform and ABDs to bilateral breast incisions as well as area of skin breakdown on the right side.  Continue to use Zofran to control nausea as needed.  Discussed importance of eating good lean proteins and vegetables as well as drinking plenty of fluids to help her body to heal.  Continue to wear compression 24/7.  Patient may switch to a sports bra if she prefers.  Continue to avoid heavy lifting.  Follow-up in 1 week.  Call office with any questions/concerns or if condition worsens.  We removed injectable saline in the Expander using a sterile  technique: Right: 0 cc for a total of 200 / 550 cc Left: -35 cc for a total of 265 / 550 cc   Pictures were obtained of the patient and placed in the chart with the patient's or guardian's permission.

## 2019-11-17 ENCOUNTER — Ambulatory Visit (INDEPENDENT_AMBULATORY_CARE_PROVIDER_SITE_OTHER): Payer: Medicare Other | Admitting: Plastic Surgery

## 2019-11-17 ENCOUNTER — Encounter: Payer: Self-pay | Admitting: Plastic Surgery

## 2019-11-17 ENCOUNTER — Other Ambulatory Visit: Payer: Self-pay

## 2019-11-17 VITALS — BP 109/76 | HR 71 | Temp 97.5°F

## 2019-11-17 DIAGNOSIS — Z9013 Acquired absence of bilateral breasts and nipples: Secondary | ICD-10-CM

## 2019-11-18 ENCOUNTER — Telehealth: Payer: Self-pay

## 2019-11-18 NOTE — Telephone Encounter (Signed)
Order faxed to [facility] for the following: Large ABD pads/ large Xeroform/6" Ace wraps Fax confirmation received- copy of this is scanned into chart

## 2019-11-22 ENCOUNTER — Telehealth: Payer: Self-pay

## 2019-11-22 ENCOUNTER — Ambulatory Visit: Payer: Medicare Other

## 2019-11-22 DIAGNOSIS — E860 Dehydration: Secondary | ICD-10-CM | POA: Diagnosis not present

## 2019-11-22 NOTE — Telephone Encounter (Signed)
Fax received from [facility]:   They will attempt to contact the pt regarding purchasing the supplies.

## 2019-11-22 NOTE — Telephone Encounter (Signed)
Patient's daughter, Lattie Haw called requesting call back from clinical staff regarding her mother. She states she has been running a fever and experiencing weakness.

## 2019-11-22 NOTE — Telephone Encounter (Signed)
Pt's daughter Melissa Chase) returned call regarding: Pt c/o-  increased pain & swelling from bilateral upper chest/rib area -  right breast more painful than left & has minimal "clear" drainage from the incision per [name] " no foul odor" Beginning on 12/21/19 Increased temp- 100.9 decreased to 100.7 with OTC Tylenol.  No fever at this time. She has increased "weakness/exhaustion" per daughter- she "slept almost 24 hrs/day for last 2 days".   Increased weakness/tired with rising/walking to eat/drink & going to the bathroom. She continues to have decreased appetite & is not taking fluids regularly. Daughter called Dr. Ferrel Logan office today & he ordered lab work to be obtained & then pt will be seen in his office today following these labs. I instructed [name] to seek 911/ED assistance if more immediate care is necessary.  She agrees and understands the plan. She will call us with an update

## 2019-11-22 NOTE — Telephone Encounter (Signed)
Call returned to pt's daughterEmmaline Chase Ph# [phone]: no answer- left v/m

## 2019-11-23 NOTE — Progress Notes (Signed)
Patient is a 78-year-old female who underwent bilateral mastectomies with Dr. Cintron-Diaz and placement of tissue expanders and Flex HD with Dr. Pace on 11/01/2019.  At last visit there was skin sloughing and darkening of the tissue around the incisions of bilateral breasts; left>> right.  Total wound size on the left was 12 x 5.5 cm; right 12 x 3 cm.  She also had an area of skin breakdown on her right posterior to the left drain.  35 cc of saline was removed from the left expander to take some tension off the incision and darkened area of skin.  Drains were removed. Patient reported nausea and had not been eating well.   ~ 3 weeks PO Patient presents today with her daughter. They report patient  is eating better since her last visit, but still room for improvement. Nausea remains. Reports patient is very easily fatigued. Denies fever, chills, and vomiting. Upon exam, bilateral wounds have declared themselves. Overall size has reduced, but necrotic tissue now present bilaterally. Mild drainage present on dressings. Tissue expanders are not exposed. Patient placed on Bactrim 800-160 on 11/1 for 7 days.  Labs on 11/22/19: [facility] CBC:  All other values in normal range RBC: 3.52 Hemoglobin: 11 Hematocrit:  34.2 Neutrophil %: 72.7  CMP: All other values in normal range Albumin: 3.4 Alk Phos: 33 Calcium: 8.6  Plan to return to the OR within 1 week to excise wounds and close. Stop Eliquis today.  Continue to try to improve nutrition status to optimize healing. Recommend switching sodas for water and increasing nutrient intake. Sent more Zofran to pharmacy.  Continue daily dressing changes of Xeroform and gauze/ABD.  We did not place injectable saline in the Expander using a sterile technique: Right: 0 cc for a total of 200 / 550 cc Left: 0 cc for a total of 165 / 550 cc (200 placed at time of surgery; 35 removed last visit)        

## 2019-11-23 NOTE — H&P (View-Only) (Signed)
Patient is a 79 year old female who underwent bilateral mastectomies with Dr. Windell Moment and placement of tissue expanders and Flex HD with Dr. Claudia Desanctis on 11/01/2019.  At last visit there was skin sloughing and darkening of the tissue around the incisions of bilateral breasts; left>> right.  Total wound size on the left was 12 x 5.5 cm; right 12 x 3 cm.  She also had an area of skin breakdown on her right posterior to the left drain.  35 cc of saline was removed from the left expander to take some tension off the incision and darkened area of skin.  Drains were removed. Patient reported nausea and had not been eating well.   ~ 3 weeks PO Patient presents today with her daughter. They report patient  is eating better since her last visit, but still room for improvement. Nausea remains. Reports patient is very easily fatigued. Denies fever, chills, and vomiting. Upon exam, bilateral wounds have declared themselves. Overall size has reduced, but necrotic tissue now present bilaterally. Mild drainage present on dressings. Tissue expanders are not exposed. Patient placed on Bactrim 800-160 on 11/1 for 7 days.  Labs on 11/22/19: [facility] CBC:  All other values in normal range RBC: 3.52 Hemoglobin: 11 Hematocrit:  34.2 Neutrophil %: 72.7  CMP: All other values in normal range Albumin: 3.4 Alk Phos: 33 Calcium: 8.6  Plan to return to the OR within 1 week to excise wounds and close. Stop Eliquis today.  Continue to try to improve nutrition status to optimize healing. Recommend switching sodas for water and increasing nutrient intake. Sent more Zofran to pharmacy.  Continue daily dressing changes of Xeroform and gauze/ABD.  We did not place injectable saline in the Expander using a sterile technique: Right: 0 cc for a total of 200 / 550 cc Left: 0 cc for a total of 165 / 550 cc (200 placed at time of surgery; 35 removed last visit)

## 2019-11-24 ENCOUNTER — Other Ambulatory Visit: Payer: Self-pay

## 2019-11-24 ENCOUNTER — Ambulatory Visit: Payer: Medicare Other | Admitting: Plastic Surgery

## 2019-11-24 ENCOUNTER — Ambulatory Visit (INDEPENDENT_AMBULATORY_CARE_PROVIDER_SITE_OTHER): Payer: Medicare Other | Admitting: Plastic Surgery

## 2019-11-24 ENCOUNTER — Encounter: Payer: Self-pay | Admitting: Plastic Surgery

## 2019-11-24 VITALS — BP 104/66 | HR 85 | Temp 98.1°F

## 2019-11-24 DIAGNOSIS — Z9013 Acquired absence of bilateral breasts and nipples: Secondary | ICD-10-CM

## 2019-11-24 MED ORDER — ONDANSETRON HCL 8 MG PO TABS: 8.0000 mg | ORAL_TABLET | Freq: Three times a day (TID) | ORAL | 2 refills | Status: DC | PRN

## 2019-11-25 ENCOUNTER — Encounter: Payer: Self-pay | Admitting: Oncology

## 2019-11-25 ENCOUNTER — Encounter (HOSPITAL_COMMUNITY): Payer: Self-pay | Admitting: Plastic Surgery

## 2019-11-25 ENCOUNTER — Ambulatory Visit: Payer: Medicare Other | Admitting: Plastic Surgery

## 2019-11-25 ENCOUNTER — Other Ambulatory Visit
Admission: RE | Admit: 2019-11-25 | Discharge: 2019-11-25 | Disposition: A | Discharge status: Home / Self Care | Payer: Medicare Other | Source: Ambulatory Visit | Attending: Plastic Surgery | Admitting: Plastic Surgery

## 2019-11-25 DIAGNOSIS — Z01812 Encounter for preprocedural laboratory examination: Secondary | ICD-10-CM | POA: Insufficient documentation

## 2019-11-25 DIAGNOSIS — Z20822 Contact with and (suspected) exposure to covid-19: Secondary | ICD-10-CM | POA: Diagnosis not present

## 2019-11-25 LAB — SARS CORONAVIRUS 2 (TAT 6-24 HRS): SARS Coronavirus 2: NEGATIVE

## 2019-11-25 NOTE — Progress Notes (Signed)
Spoke with pt for pre-op call. Pt has hx of A-fib and is on Eliquis. She states Dr. Claudia Desanctis instructed her to hold Eliquis 3 days prior to surgery. Last dose was 11/23/19 AM dose. Cardiologist is Dr. Nehemiah Massed. Last office visit was 09/08/19.   Covid test done today, results pending. Pt states she has been in quarantine since the test was done and understands that she stays in quarantine until she comes to the hospital.

## 2019-11-26 ENCOUNTER — Encounter (HOSPITAL_COMMUNITY)
Admission: RE | Disposition: A | Discharge status: Home / Self Care | Payer: Self-pay | Source: Home / Self Care | Attending: Plastic Surgery

## 2019-11-26 ENCOUNTER — Other Ambulatory Visit: Payer: Self-pay

## 2019-11-26 ENCOUNTER — Other Ambulatory Visit: Payer: Self-pay | Admitting: Plastic Surgery

## 2019-11-26 ENCOUNTER — Encounter (HOSPITAL_COMMUNITY): Payer: Self-pay | Admitting: Plastic Surgery

## 2019-11-26 ENCOUNTER — Ambulatory Visit (HOSPITAL_COMMUNITY): Payer: Medicare Other | Admitting: Anesthesiology

## 2019-11-26 ENCOUNTER — Ambulatory Visit (HOSPITAL_COMMUNITY)
Admission: RE | Admit: 2019-11-26 | Discharge: 2019-11-26 | Disposition: A | Discharge status: Home / Self Care | Payer: Medicare Other | Attending: Plastic Surgery | Admitting: Plastic Surgery

## 2019-11-26 DIAGNOSIS — D0511 Intraductal carcinoma in situ of right breast: Secondary | ICD-10-CM | POA: Diagnosis not present

## 2019-11-26 DIAGNOSIS — I4891 Unspecified atrial fibrillation: Secondary | ICD-10-CM | POA: Diagnosis not present

## 2019-11-26 DIAGNOSIS — I420 Dilated cardiomyopathy: Secondary | ICD-10-CM | POA: Diagnosis not present

## 2019-11-26 DIAGNOSIS — L7682 Other postprocedural complications of skin and subcutaneous tissue: Secondary | ICD-10-CM | POA: Insufficient documentation

## 2019-11-26 DIAGNOSIS — N61 Mastitis without abscess: Secondary | ICD-10-CM | POA: Diagnosis not present

## 2019-11-26 DIAGNOSIS — Z9013 Acquired absence of bilateral breasts and nipples: Secondary | ICD-10-CM | POA: Diagnosis not present

## 2019-11-26 HISTORY — PX: TISSUE EXPANDER PLACEMENT: SHX2530

## 2019-11-26 HISTORY — PX: DEBRIDEMENT AND CLOSURE WOUND: SHX5614

## 2019-11-26 HISTORY — DX: Other constipation: K59.09

## 2019-11-26 LAB — BASIC METABOLIC PANEL
Anion gap: 10 (ref 5–15)
BUN: 11 mg/dL (ref 8–23)
CO2: 21 mmol/L — ABNORMAL LOW (ref 22–32)
Calcium: 8.5 mg/dL — ABNORMAL LOW (ref 8.9–10.3)
Chloride: 106 mmol/L (ref 98–111)
Creatinine, Ser: 1.15 mg/dL — ABNORMAL HIGH (ref 0.44–1.00)
GFR, Estimated: 49 mL/min — ABNORMAL LOW (ref >60)
Glucose, Bld: 87 mg/dL (ref 70–99)
Potassium: 3.9 mmol/L (ref 3.5–5.1)
Sodium: 137 mmol/L (ref 135–145)

## 2019-11-26 LAB — CBC
HCT: 33.8 % — ABNORMAL LOW (ref 36.0–46.0)
Hemoglobin: 10.7 g/dL — ABNORMAL LOW (ref 12.0–15.0)
MCH: 31.1 pg (ref 26.0–34.0)
MCHC: 31.7 g/dL (ref 30.0–36.0)
MCV: 98.3 fL (ref 80.0–100.0)
Platelets: 193 10*3/uL (ref 150–400)
RBC: 3.44 MIL/uL — ABNORMAL LOW (ref 3.87–5.11)
RDW: 11.8 % (ref 11.5–15.5)
WBC: 5.9 10*3/uL (ref 4.0–10.5)
nRBC: 0 % (ref 0.0–0.2)

## 2019-11-26 SURGERY — DEBRIDEMENT, WOUND, WITH CLOSURE
Anesthesia: General | Site: Breast | Laterality: Bilateral

## 2019-11-26 MED ORDER — GLYCOPYRROLATE 0.2 MG/ML IJ SOLN
INTRAMUSCULAR | Status: DC | PRN
  Administered 2019-11-26: .2 mg via INTRAVENOUS

## 2019-11-26 MED ORDER — SODIUM CHLORIDE 0.9 % IV SOLN
Freq: Once | INTRAVENOUS | Status: DC
  Filled 2019-11-26: qty 10

## 2019-11-26 MED ORDER — FENTANYL CITRATE (PF) 250 MCG/5ML IJ SOLN
INTRAMUSCULAR | Status: AC
  Filled 2019-11-26: qty 5

## 2019-11-26 MED ORDER — 0.9 % SODIUM CHLORIDE (POUR BTL) OPTIME
TOPICAL | Status: DC | PRN
  Administered 2019-11-26: 1000 mL

## 2019-11-26 MED ORDER — SODIUM CHLORIDE 0.9 % IV SOLN
INTRAVENOUS | Status: DC | PRN
  Administered 2019-11-26: 500 mL

## 2019-11-26 MED ORDER — ROCURONIUM BROMIDE 10 MG/ML (PF) SYRINGE
PREFILLED_SYRINGE | INTRAVENOUS | Status: AC
  Filled 2019-11-26: qty 10

## 2019-11-26 MED ORDER — FENTANYL CITRATE (PF) 100 MCG/2ML IJ SOLN
25.0000 ug | INTRAMUSCULAR | Status: DC | PRN
  Administered 2019-11-26: 25 ug via INTRAVENOUS

## 2019-11-26 MED ORDER — PHENYLEPHRINE 40 MCG/ML (10ML) SYRINGE FOR IV PUSH (FOR BLOOD PRESSURE SUPPORT)
PREFILLED_SYRINGE | INTRAVENOUS | Status: DC | PRN
  Administered 2019-11-26 (×2): 80 ug via INTRAVENOUS

## 2019-11-26 MED ORDER — LIDOCAINE 2% (20 MG/ML) 5 ML SYRINGE
INTRAMUSCULAR | Status: DC | PRN
  Administered 2019-11-26: 50 mg via INTRAVENOUS

## 2019-11-26 MED ORDER — LACTATED RINGERS IV SOLN: INTRAVENOUS | Status: DC

## 2019-11-26 MED ORDER — SUGAMMADEX SODIUM 200 MG/2ML IV SOLN
INTRAVENOUS | Status: DC | PRN
  Administered 2019-11-26: 200 mg via INTRAVENOUS

## 2019-11-26 MED ORDER — ROCURONIUM BROMIDE 10 MG/ML (PF) SYRINGE
PREFILLED_SYRINGE | INTRAVENOUS | Status: DC | PRN
  Administered 2019-11-26: 50 mg via INTRAVENOUS

## 2019-11-26 MED ORDER — CEFAZOLIN SODIUM-DEXTROSE 2-4 GM/100ML-% IV SOLN
2.0000 g | INTRAVENOUS | Status: AC
  Administered 2019-11-26: 2 g via INTRAVENOUS
  Filled 2019-11-26: qty 100

## 2019-11-26 MED ORDER — PHENYLEPHRINE HCL-NACL 10-0.9 MG/250ML-% IV SOLN
INTRAVENOUS | Status: DC | PRN
  Administered 2019-11-26: 30 ug/min via INTRAVENOUS

## 2019-11-26 MED ORDER — FENTANYL CITRATE (PF) 100 MCG/2ML IJ SOLN
INTRAMUSCULAR | Status: AC
  Filled 2019-11-26: qty 2

## 2019-11-26 MED ORDER — ONDANSETRON HCL 4 MG/2ML IJ SOLN: 4.0000 mg | Freq: Four times a day (QID) | INTRAMUSCULAR | Status: DC | PRN

## 2019-11-26 MED ORDER — ONDANSETRON HCL 4 MG/2ML IJ SOLN
INTRAMUSCULAR | Status: AC
  Filled 2019-11-26: qty 2

## 2019-11-26 MED ORDER — PROPOFOL 10 MG/ML IV BOLUS
INTRAVENOUS | Status: DC | PRN
  Administered 2019-11-26: 120 mg via INTRAVENOUS

## 2019-11-26 MED ORDER — OXYCODONE HCL 5 MG PO TABS: 5.0000 mg | ORAL_TABLET | Freq: Once | ORAL | Status: DC | PRN

## 2019-11-26 MED ORDER — DEXAMETHASONE SODIUM PHOSPHATE 10 MG/ML IJ SOLN
INTRAMUSCULAR | Status: DC | PRN
  Administered 2019-11-26: 4 mg via INTRAVENOUS

## 2019-11-26 MED ORDER — ONDANSETRON HCL 4 MG/2ML IJ SOLN
INTRAMUSCULAR | Status: DC | PRN
  Administered 2019-11-26: 4 mg via INTRAVENOUS

## 2019-11-26 MED ORDER — PHENYLEPHRINE HCL (PRESSORS) 10 MG/ML IV SOLN
INTRAVENOUS | Status: AC
  Filled 2019-11-26: qty 1

## 2019-11-26 MED ORDER — LIDOCAINE 2% (20 MG/ML) 5 ML SYRINGE
INTRAMUSCULAR | Status: AC
  Filled 2019-11-26: qty 5

## 2019-11-26 MED ORDER — DEXAMETHASONE SODIUM PHOSPHATE 10 MG/ML IJ SOLN
INTRAMUSCULAR | Status: AC
  Filled 2019-11-26: qty 1

## 2019-11-26 MED ORDER — CHLORHEXIDINE GLUCONATE 0.12 % MT SOLN
15.0000 mL | Freq: Once | OROMUCOSAL | Status: AC
  Administered 2019-11-26: 15 mL via OROMUCOSAL
  Filled 2019-11-26: qty 15

## 2019-11-26 MED ORDER — ORAL CARE MOUTH RINSE: 15.0000 mL | Freq: Once | OROMUCOSAL | Status: AC

## 2019-11-26 MED ORDER — SODIUM CHLORIDE 0.9 % IV SOLN
Freq: Once | INTRAVENOUS | Status: AC
  Administered 2019-11-26: 500 mL
  Filled 2019-11-26: qty 10

## 2019-11-26 MED ORDER — OXYCODONE HCL 5 MG/5ML PO SOLN: 5.0000 mg | Freq: Once | ORAL | Status: DC | PRN

## 2019-11-26 MED ORDER — SULFAMETHOXAZOLE-TRIMETHOPRIM 800-160 MG PO TABS: 1.0000 | ORAL_TABLET | Freq: Two times a day (BID) | ORAL | 0 refills | Status: DC

## 2019-11-26 MED ORDER — PHENYLEPHRINE 40 MCG/ML (10ML) SYRINGE FOR IV PUSH (FOR BLOOD PRESSURE SUPPORT)
PREFILLED_SYRINGE | INTRAVENOUS | Status: AC
  Filled 2019-11-26: qty 10

## 2019-11-26 MED ORDER — FENTANYL CITRATE (PF) 250 MCG/5ML IJ SOLN
INTRAMUSCULAR | Status: DC | PRN
  Administered 2019-11-26: 100 ug via INTRAVENOUS

## 2019-11-26 SURGICAL SUPPLY — 44 items
BAG DECANTER FOR FLEXI CONT (MISCELLANEOUS) ×4 IMPLANT
BENZOIN TINCTURE PRP APPL 2/3 (GAUZE/BANDAGES/DRESSINGS) ×4 IMPLANT
BIOPATCH RED 1 DISK 7.0 (GAUZE/BANDAGES/DRESSINGS) ×4 IMPLANT
BLADE SURG 15 STRL LF DISP TIS (BLADE) ×1 IMPLANT
BLADE SURG 15 STRL SS (BLADE) ×2
BNDG ELASTIC 6X10 VLCR STRL LF (GAUZE/BANDAGES/DRESSINGS) ×2 IMPLANT
CANISTER SUCT 1200ML W/VALVE (MISCELLANEOUS) ×2 IMPLANT
CANISTER WOUND CARE 500ML ATS (WOUND CARE) IMPLANT
CHLORAPREP W/TINT 26 (MISCELLANEOUS) IMPLANT
CLSR STERI-STRIP ANTIMIC 1/2X4 (GAUZE/BANDAGES/DRESSINGS) ×4 IMPLANT
COVER SURGICAL LIGHT HANDLE (MISCELLANEOUS) ×2 IMPLANT
COVER WAND RF STERILE (DRAPES) ×2 IMPLANT
DRAIN WOUND SNY 15 RND (WOUND CARE) ×4 IMPLANT
DRAPE INCISE IOBAN 66X45 STRL (DRAPES) IMPLANT
DRAPE LAPAROTOMY T 98X78 PEDS (DRAPES) IMPLANT
DRSG EMULSION OIL 3X3 NADH (GAUZE/BANDAGES/DRESSINGS) IMPLANT
DRSG TEGADERM 2-3/8X2-3/4 SM (GAUZE/BANDAGES/DRESSINGS) ×4 IMPLANT
DRSG TELFA 3X8 NADH (GAUZE/BANDAGES/DRESSINGS) IMPLANT
DRSG VAC ATS LRG SENSATRAC (GAUZE/BANDAGES/DRESSINGS) IMPLANT
DRSG VAC ATS MED SENSATRAC (GAUZE/BANDAGES/DRESSINGS) IMPLANT
ELECT CAUTERY BLADE 6.4 (BLADE) ×2 IMPLANT
ELECT REM PT RETURN 9FT ADLT (ELECTROSURGICAL) ×2
ELECTRODE REM PT RTRN 9FT ADLT (ELECTROSURGICAL) ×1 IMPLANT
EVACUATOR SILICONE 100CC (DRAIN) ×4 IMPLANT
EXPANDER TISSUE CPX4 550CC (Expander) ×4 IMPLANT
GAUZE SPONGE 4X4 12PLY STRL (GAUZE/BANDAGES/DRESSINGS) ×4 IMPLANT
GLOVE BIOGEL M STRL SZ7.5 (GLOVE) ×4 IMPLANT
GLOVE BIOGEL PI IND STRL 8 (GLOVE) ×1 IMPLANT
GLOVE BIOGEL PI INDICATOR 8 (GLOVE) ×1
GOWN STRL REUS W/ TWL LRG LVL3 (GOWN DISPOSABLE) ×2 IMPLANT
GOWN STRL REUS W/TWL LRG LVL3 (GOWN DISPOSABLE) ×4
KIT BASIN OR (CUSTOM PROCEDURE TRAY) ×2 IMPLANT
KIT TURNOVER KIT A (KITS) ×2 IMPLANT
NS IRRIG 1000ML POUR BTL (IV SOLUTION) ×2 IMPLANT
PACK GENERAL/GYN (CUSTOM PROCEDURE TRAY) ×2 IMPLANT
PAD ABD 8X10 STRL (GAUZE/BANDAGES/DRESSINGS) ×4 IMPLANT
SOLUTION BETADINE 4OZ (MISCELLANEOUS) ×2 IMPLANT
SUT ETHILON 3 0 PS 1 (SUTURE) ×4 IMPLANT
SUT PDS AB 3-0 SH 27 (SUTURE) ×14 IMPLANT
SUT VLOC 90 P-14 23 (SUTURE) ×4 IMPLANT
SWAB COLLECTION DEVICE MRSA (MISCELLANEOUS) IMPLANT
SYR CONTROL 10ML LL (SYRINGE) ×2 IMPLANT
TOWEL GREEN STERILE (TOWEL DISPOSABLE) ×2 IMPLANT
TOWEL GREEN STERILE FF (TOWEL DISPOSABLE) ×2 IMPLANT

## 2019-11-26 NOTE — Brief Op Note (Signed)
11/26/2019  11:57 AM  PATIENT:  Melissa Chase  79 y.o. female  PRE-OPERATIVE DIAGNOSIS:  post mastectomy, necrotic flap  POST-OPERATIVE DIAGNOSIS:  post mastectomy, necrotic flap  PROCEDURE:  Procedure(s) with comments: Excision of bilateral mastectomy flap necrosis with complex closure (Bilateral) - 1.5 hours total exchange of tissue expanders (Bilateral)  SURGEON:  Surgeon(s) and Role:    * Willow Shidler, Steffanie Dunn, MD - Primary  PHYSICIAN ASSISTANT: Phoebe Sharps, PA  ASSISTANTS: none   ANESTHESIA:   general  EBL:  25 mL   BLOOD ADMINISTERED:none  DRAINS: (2) Jackson-Pratt drain(s) with closed bulb suction in the chest   LOCAL MEDICATIONS USED:  NONE  SPECIMEN:  Source of Specimen:  right and left mastectomy flaps  DISPOSITION OF SPECIMEN:  PATHOLOGY  COUNTS:  YES  TOURNIQUET:  * No tourniquets in log *  DICTATION: .Dragon Dictation  PLAN OF CARE: Discharge to home after PACU  PATIENT DISPOSITION:  PACU - hemodynamically stable.   Delay start of Pharmacological VTE agent (>24hrs) due to surgical blood loss or risk of bleeding: not applicable

## 2019-11-26 NOTE — Discharge Instructions (Signed)
General Anesthesia, Adult, Care After °This sheet gives you information about how to care for yourself after your procedure. Your health care provider may also give you more specific instructions. If you have problems or questions, contact your health care provider. °What can I expect after the procedure? °After the procedure, the following side effects are common: °· Pain or discomfort at the IV site. °· Nausea. °· Vomiting. °· Sore throat. °· Trouble concentrating. °· Feeling cold or chills. °· Weak or tired. °· Sleepiness and fatigue. °· Soreness and body aches. These side effects can affect parts of the body that were not involved in surgery. °Follow these instructions at home: ° °For at least 24 hours after the procedure: °· Have a responsible adult stay with you. It is important to have someone help care for you until you are awake and alert. °· Rest as needed. °· Do not: °? Participate in activities in which you could fall or become injured. °? Drive. °? Use heavy machinery. °? Drink alcohol. °? Take sleeping pills or medicines that cause drowsiness. °? Make important decisions or sign legal documents. °? Take care of children on your own. °Eating and drinking °· Follow any instructions from your health care provider about eating or drinking restrictions. °· When you feel hungry, start by eating small amounts of foods that are soft and easy to digest (bland), such as toast. Gradually return to your regular diet. °· Drink enough fluid to keep your urine pale yellow. °· If you vomit, rehydrate by drinking water, juice, or clear broth. °General instructions °· If you have sleep apnea, surgery and certain medicines can increase your risk for breathing problems. Follow instructions from your health care provider about wearing your sleep device: °? Anytime you are sleeping, including during daytime naps. °? While taking prescription pain medicines, sleeping medicines, or medicines that make you drowsy. °· Return to  your normal activities as told by your health care provider. Ask your health care provider what activities are safe for you. °· Take over-the-counter and prescription medicines only as told by your health care provider. °· If you smoke, do not smoke without supervision. °· Keep all follow-up visits as told by your health care provider. This is important. °Contact a health care provider if: °· You have nausea or vomiting that does not get better with medicine. °· You cannot eat or drink without vomiting. °· You have pain that does not get better with medicine. °· You are unable to pass urine. °· You develop a skin rash. °· You have a fever. °· You have redness around your IV site that gets worse. °Get help right away if: °· You have difficulty breathing. °· You have chest pain. °· You have blood in your urine or stool, or you vomit blood. °Summary °· After the procedure, it is common to have a sore throat or nausea. It is also common to feel tired. °· Have a responsible adult stay with you for the first 24 hours after general anesthesia. It is important to have someone help care for you until you are awake and alert. °· When you feel hungry, start by eating small amounts of foods that are soft and easy to digest (bland), such as toast. Gradually return to your regular diet. °· Drink enough fluid to keep your urine pale yellow. °· Return to your normal activities as told by your health care provider. Ask your health care provider what activities are safe for you. °This information is not   intended to replace advice given to you by your health care provider. Make sure you discuss any questions you have with your health care provider. Document Revised: 01/10/2017 Document Reviewed: 08/23/2016 Elsevier Patient Education  [place]. Activity As tolerated: NO showers for 3 days No heavy activities  Diet: Regular  Wound Care: Keep dressing clean & dry for 3 days.  Keep wrap applied with compression as much  as possible 24/7.    Do not change dressings for 3 days unless soiled.  Can change if needed but make sure to reapply wrap. After three days can remove wrap and shower.  Then reapply dressings if needed and continue compression with wrap or soft sports bra. Call doctor if any unusual problems occur such as pain, excessive bleeding, unrelieved nausea/vomiting, fever &/or chills  Antibiotics were sent to the pharmacy for you.  Follow-up appointment: Scheduled for next week on Nov 11.

## 2019-11-26 NOTE — Anesthesia Preprocedure Evaluation (Signed)
Anesthesia Evaluation  Patient identified by MRN, date of birth, ID band Patient awake    Reviewed: Allergy & Precautions, H&P , NPO status , Patient's Chart, lab work & pertinent test results  Airway Mallampati: II   Neck ROM: full    Dental   Pulmonary neg pulmonary ROS,    breath sounds clear to auscultation       Cardiovascular hypertension, + dysrhythmias Atrial Fibrillation  Rhythm:regular Rate:Normal     Neuro/Psych PSYCHIATRIC DISORDERS Depression    GI/Hepatic GERD  ,  Endo/Other    Renal/GU      Musculoskeletal  (+) Arthritis ,   Abdominal   Peds  Hematology   Anesthesia Other Findings   Reproductive/Obstetrics                             Anesthesia Physical Anesthesia Plan  ASA: III  Anesthesia Plan: General   Post-op Pain Management:    Induction: Intravenous  PONV Risk Score and Plan: 3 and Ondansetron, Dexamethasone and Treatment may vary due to age or medical condition  Airway Management Planned: Oral ETT  Additional Equipment:   Intra-op Plan:   Post-operative Plan: Extubation in OR  Informed Consent: I have reviewed the patients History and Physical, chart, labs and discussed the procedure including the risks, benefits and alternatives for the proposed anesthesia with the patient or authorized representative who has indicated his/her understanding and acceptance.       Plan Discussed with: CRNA, Anesthesiologist and Surgeon  Anesthesia Plan Comments:         Anesthesia Quick Evaluation

## 2019-11-26 NOTE — Anesthesia Procedure Notes (Signed)
Procedure Name: Intubation Date/Time: 11/26/2019 10:25 AM Performed by: Thelma Comp, CRNA Pre-anesthesia Checklist: Patient identified, Emergency Drugs available, Suction available and Patient being monitored Patient Re-evaluated:Patient Re-evaluated prior to induction Oxygen Delivery Method: Circle System Utilized Preoxygenation: Pre-oxygenation with 100% oxygen Induction Type: IV induction Ventilation: Mask ventilation without difficulty Laryngoscope Size: Mac and 3 Grade View: Grade I Tube type: Oral Tube size: 7.0 mm Number of attempts: 1 Airway Equipment and Method: Stylet and Oral airway Placement Confirmation: ETT inserted through vocal cords under direct vision,  positive ETCO2 and breath sounds checked- equal and bilateral Secured at: 21 cm Tube secured with: Tape Dental Injury: Teeth and Oropharynx as per pre-operative assessment

## 2019-11-26 NOTE — Transfer of Care (Signed)
Immediate Anesthesia Transfer of Care Note  Patient: Melissa Chase  Procedure(s) Performed: Excision of bilateral mastectomy flap necrosis with complex closure (Bilateral Breast) exchange of tissue expanders (Bilateral Breast)  Patient Location: PACU  Anesthesia Type:General  Level of Consciousness: drowsy and patient cooperative  Airway & Oxygen Therapy: Patient Spontanous Breathing and Patient connected to face mask oxygen  Post-op Assessment: Report given to RN and Post -op Vital signs reviewed and stable  Post vital signs: Reviewed and stable  Last Vitals:  Vitals Value Taken Time  BP 154/140 11/26/19 1211  Temp 36.4 C 11/26/19 1210  Pulse    Resp 16 11/26/19 1214  SpO2 95%   Vitals shown include unvalidated device data.  Last Pain:  Vitals:   11/26/19 0807  TempSrc: Temporal  PainSc:       Patients Stated Pain Goal: 3 (11/64/35 3912)  Complications: No complications documented.

## 2019-11-26 NOTE — Interval H&P Note (Signed)
History and Physical Interval Note:  11/26/2019 10:00 AM  Melissa Chase  has presented today for surgery, with the diagnosis of post mastectomy, necrotic flap.  The various methods of treatment have been discussed with the patient and family. After consideration of risks, benefits and other options for treatment, the patient has consented to  Procedure(s) with comments: Excision of bilateral mastectomy flap necrosis with complex closure (Bilateral) - 1.5 hours total possible exchange of tissue expanders (Bilateral) as a surgical intervention.  The patient's history has been reviewed, patient examined, no change in status, stable for surgery.  I have reviewed the patient's chart and labs.  Questions were answered to the patient's satisfaction.     Cindra Presume

## 2019-11-26 NOTE — Op Note (Signed)
Operative Note   DATE OF OPERATION: 11/26/2019  SURGICAL DEPARTMENT: Plastic Surgery  PREOPERATIVE DIAGNOSES: Bilateral mastectomy flap necrosis  POSTOPERATIVE DIAGNOSES:  same  PROCEDURE: 1.  Excision of right mastectomy flap necrosis and 20 x 4 cm with complex closure 2.  Excision of left mastectomy flap necrosis 22 x 6 cm with complex closure 3.  Removal of bilateral breast tissue expanders and replacement with new tissue expanders.  SURGEON: Talmadge Coventry, MD  ASSISTANT: Phoebe Sharps, PA The advanced practice practitioner (APP) assisted throughout the case.  The APP was essential in retraction and counter traction when needed to make the case progress smoothly.  This retraction and assistance made it possible to see the tissue plans for the procedure.  The assistance was needed for blood control, tissue re-approximation and assisted with closure of the incision site.  ANESTHESIA:  General.   COMPLICATIONS: None.   INDICATIONS FOR PROCEDURE:  The patient, Melissa Chase is a 79 y.o. female born on 10/18/1940, is here for treatment of bilateral mastectomy flap necrosis in the setting of tissue expander reconstruction. MRN: [id number]  CONSENT:  Informed consent was obtained directly from the patient. Risks, benefits and alternatives were fully discussed. Specific risks including but not limited to bleeding, infection, hematoma, seroma, scarring, pain, contracture, asymmetry, wound healing problems, and need for further surgery were all discussed. The patient did have an ample opportunity to have questions answered to satisfaction.   DESCRIPTION OF PROCEDURE:  The patient was taken to the operating room. SCDs were placed and antibiotics were given.  General anesthesia was administered.  The patient's operative site was prepped and draped in a sterile fashion. A time out was performed and all information was confirmed to be correct.  Started by examining the left mastectomy flap.   There is a large area of necrosis that was about 22 x 6 cm.  This was clearly full-thickness necrosis.  This was excised full-thickness with a 10 blade and sent for specimen.  Hemostasis was obtained.  There was minimal fluid in the pocket.  There was no incorporation of the acellular dermal matrix at all.  The expander and acellular dermal matrix were removed.  The pocket was copiously irrigated with triple antibiotic solution.  A 15 French drain was placed and secured with a nylon suture.  A new tissue expander was brought onto the field and deflated.  This was a Mentor smooth CPX 4 medium height expander.  The serial number is [id number].  This was secured to the chest wall inferiorly medially and laterally with 3-0 PDS sutures.  The wound closure was a bit tight so no saline was infiltrated at all.  Left side was then closed with interrupted buried 3-0 PDS sutures and a running 3 OV lock suture.  I then turned my attention to the right side.  This side had a area of full-thickness mastectomy flap necrosis as it was about 20 x 4 cm.  This was sharply excised with a 10 blade and sent for specimen.  Hemostasis was obtained.  There was a little bit of fluid in the right pocket but no purulence.  Similarly there was 0 incorporation of the acellular dermal matrix.  The expander and acellular dermal matrix were removed.  The pocket was copiously irrigated with triple antibiotic solution.  A new drain was placed and secured with a nylon suture.  A new tissue expander was then brought onto the field and this was the same type as used on the left  side but the serial number was [id number].  For symmetry no fluid was infiltrated and the wound was closed over top with a little bit less tension than on the left side.  This was done with interrupted buried 3-0 PDS sutures and a running 3 OV lock.  Benzoin and Steri-Strips were then applied.  Patient was then covered with 4 x 4's ABDs and Ace wrap.  The patient tolerated  the procedure well.  There were no complications. The patient was allowed to wake from anesthesia, extubated and taken to the recovery room in satisfactory condition.

## 2019-11-28 ENCOUNTER — Encounter (HOSPITAL_COMMUNITY): Payer: Self-pay | Admitting: Plastic Surgery

## 2019-11-29 ENCOUNTER — Inpatient Hospital Stay: Payer: Medicare Other | Admitting: Oncology

## 2019-11-29 LAB — SURGICAL PATHOLOGY

## 2019-11-29 NOTE — Anesthesia Postprocedure Evaluation (Signed)
Anesthesia Post Note  Patient: Siarah Deleo Spillane  Procedure(s) Performed: Excision of bilateral mastectomy flap necrosis with complex closure (Bilateral Breast) exchange of tissue expanders (Bilateral Breast)     Patient location during evaluation: PACU Anesthesia Type: General Level of consciousness: awake and alert Pain management: pain level controlled Vital Signs Assessment: post-procedure vital signs reviewed and stable Respiratory status: spontaneous breathing, nonlabored ventilation, respiratory function stable and patient connected to nasal cannula oxygen Cardiovascular status: blood pressure returned to baseline and stable Postop Assessment: no apparent nausea or vomiting Anesthetic complications: no   No complications documented.  Last Vitals:  Vitals:   11/26/19 1300 11/26/19 1315  BP: (!) 92/53 (!) 105/55  Pulse: 63 62  Resp: 16 16  Temp:  36.7 C  SpO2: 93% 95%    Last Pain:  Vitals:   11/26/19 1315  TempSrc:   PainSc: 2                  Seena Ritacco S

## 2019-12-02 ENCOUNTER — Other Ambulatory Visit: Payer: Self-pay

## 2019-12-02 ENCOUNTER — Ambulatory Visit (INDEPENDENT_AMBULATORY_CARE_PROVIDER_SITE_OTHER): Payer: Medicare Other | Admitting: Plastic Surgery

## 2019-12-02 ENCOUNTER — Encounter: Payer: Self-pay | Admitting: Plastic Surgery

## 2019-12-02 VITALS — BP 109/61 | HR 70 | Temp 98.8°F

## 2019-12-02 DIAGNOSIS — Z9013 Acquired absence of bilateral breasts and nipples: Secondary | ICD-10-CM

## 2019-12-02 NOTE — Progress Notes (Signed)
Patient is here 1 week out from excision of bilateral mastectomy flap necrosis.  I did switch out her tissue expanders at the same time.  She overall feels much better than she did before and has much more energy.  On exam the remaining skin looks nicely viable with no evidence of dehiscence or skin compromise.  Her drains are putting out about 40 cc a day so we will plan to leave it for another week.  We will plan to see her back at that time and hopefully remove the drains.  I did explain that I probably wait 6 to 8 weeks postop to start expanding her to not push the issue.  She is in agreement and will see her next week.

## 2019-12-05 NOTE — Progress Notes (Signed)
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 Claudia Desanctis. Plan to wait approximately 6 to 8 weeks postop to start expanding her so as to not overstress the mastectomy flaps.  ~ 2 weeks PO Patient presents today with her daughter.  They report she still is not eating well; feels nauseous and full after a few bites.  She does not feel current nausea medication is helping.  Reports she has not had a bowel movement recently.  Denies fever/chills.  Drain output has not gotten below 43 cc on either side over the last week.  Patient reports sports bra is uncomfortable.  Reports her skin itches.  Patient is red along the band of the sports bra.  I feel it is too tight. Bilateral incisions are intact, clean and dry.  Steri-Strips in place.  No signs of infection or necrosis.  Skin surrounding incisions appears to be viable.  Recommend taking MiraLAX or other stool softener until patient has some regular bowel movements.  Recommend she continue using protein shakes as tolerated.  Recommend she try eating small more frequent meals that will be easier on her stomach such as chicken noodle soup with saltine crackers.  Emphasized need to stay hydrated.  Sent Rx for a different nausea medication to pharmacy for her to try.  She may apply Vaseline to her skin for moisture and to help with itching.  Recommend she get a larger size sports bra, as the one she is wearing is too tight.  She should look for something that the band fits comfortably around her.  We will leave the drains in place at this time.  She may place gauze or other padding under the drain tubes for comfort.    If drain output decreases to around 20 cc or less for multiple days in a row she may call the office and come in and have them removed next week.  Follow-up in 2 weeks.  Return precautions given.  Call office with any  questions/concerns.

## 2019-12-07 ENCOUNTER — Ambulatory Visit: Payer: Medicare Other | Admitting: Oncology

## 2019-12-07 ENCOUNTER — Inpatient Hospital Stay: Payer: Medicare Other | Attending: Oncology | Admitting: Oncology

## 2019-12-07 DIAGNOSIS — D0510 Intraductal carcinoma in situ of unspecified breast: Secondary | ICD-10-CM | POA: Diagnosis not present

## 2019-12-09 ENCOUNTER — Encounter: Payer: Self-pay | Admitting: Plastic Surgery

## 2019-12-09 ENCOUNTER — Ambulatory Visit (INDEPENDENT_AMBULATORY_CARE_PROVIDER_SITE_OTHER): Payer: Medicare Other | Admitting: Plastic Surgery

## 2019-12-09 ENCOUNTER — Other Ambulatory Visit: Payer: Self-pay

## 2019-12-09 VITALS — BP 94/61 | HR 94 | Temp 98.1°F

## 2019-12-09 DIAGNOSIS — Z9013 Acquired absence of bilateral breasts and nipples: Secondary | ICD-10-CM

## 2019-12-09 MED ORDER — METOCLOPRAMIDE HCL 10 MG PO TABS: 10.0000 mg | ORAL_TABLET | Freq: Four times a day (QID) | ORAL | 0 refills | Status: DC

## 2019-12-09 NOTE — Progress Notes (Signed)
I connected with Melissa Chase on 12/09/19 at  1:00 PM EST by video enabled telemedicine visit and verified that I am speaking with the correct person using two identifiers.   I discussed the limitations, risks, security and privacy concerns of performing an evaluation and management service by telemedicine and the availability of in-person appointments. I also discussed with the patient that there may be a patient responsible charge related to this service. The patient expressed understanding and agreed to proceed.  Other persons participating in the visit and their role in the encounter:  Patients daughter Melissa Chase  Patient's location:  home Provider's location:  work  Risk analyst Complaint: Discuss mastectomy pathology results and further management  History of present illness:  Patient is a 79 year old female with a history of left breast DCIS in 2006. At that time she underwent a lumpectomy followed by radiation treatment and was on tamoxifen for 5 years. Subsequently she has been undergoing regular screening mammograms. She self palpated a mass in the right subareolar region. This was followed by a mammogram and ultrasound in May 2019 which revealed a solid 1.1 x 0.9 x 0.5 cm right breast mass. No evidence of right axillary adenopathy. 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 Consideration includes DCIS involving a papilloma and encapsulated papillary carcinoma. Patient underwent lumpectomy on 07/09/2017 by Dr. Peyton Najjar which showed a 12 mm grade 1-2 DCIS with involvement of pre-existing papilloma and adjacent ducts. Lateral excision revealed atypical ductal hyperplasia. Margins were negative. Tumor was greater than ER 90% positive and greater than 90% PR positive. Pathologic stage Tis NX. Patientcompleted adjuvant RT and was started on arimidex. She could not tolerate arimidex and was switched to tamoxifen   Mammogram in September 2021 showed suspicious  group of calcifications in the left breast which was biopsy-proven DCIS and patient decided to go through a bilateral mastectomy at this time.  Mastectomy specimen showed mild residual DCIS and grade 2 breasts with negative margins.  7 lymph nodes negative for malignancy.  No evidence of invasive mammary carcinoma in either breast.  Patient had a nonhealing mastectomy flap and had to undergo residual flap excision which showed neutrophilic Inflammation   Interval history patient still has bilateral breast drains in place which she hopes will come out soon.  She is slowly recovering from her mastectomy surgery and is otherwise doing well.   Review of Systems  Constitutional: Negative for chills, fever, malaise/fatigue and weight loss.  HENT: Negative for congestion, ear discharge and nosebleeds.   Eyes: Negative for blurred vision.  Respiratory: Negative for cough, hemoptysis, sputum production, shortness of breath and wheezing.   Cardiovascular: Negative for chest pain, palpitations, orthopnea and claudication.  Gastrointestinal: Negative for abdominal pain, blood in stool, constipation, diarrhea, heartburn, melena, nausea and vomiting.  Genitourinary: Negative for dysuria, flank pain, frequency, hematuria and urgency.  Musculoskeletal: Negative for back pain, joint pain and myalgias.  Skin: Negative for rash.  Neurological: Negative for dizziness, tingling, focal weakness, seizures, weakness and headaches.  Endo/Heme/Allergies: Does not bruise/bleed easily.  Psychiatric/Behavioral: Negative for depression and suicidal ideas. The patient does not have insomnia.     No Known Allergies  Past Medical History:  Diagnosis Date  . Afib ([place])   . Arthritis   . Breast cancer ([place]) 2006   left breast  . Breast cancer ([place]) 2019   right breast  . Chronic constipation   . Depression   . GERD (gastroesophageal reflux disease)   . Hypertension   . Insomnia   .  Insomnia   . Personal history of  radiation therapy 2006   37 tx.  . Personal history of radiation therapy 2019    Past Surgical History:  Procedure Laterality Date  . ABDOMINAL HYSTERECTOMY    . APPLICATION OF WOUND VAC  11/01/2019   Procedure: APPLICATION OF WOUND VAC;  Surgeon: Herbert Pun, MD;  Location: [facility];  Service: General;;  . 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 BREAST BIOPSY Left 10/12/2019   affirm bx of calcs, x marker, path pending  . 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 BREAST LUMPECTOMY Right 07/09/2017   DCIS, clear margins  . BREAST LUMPECTOMY Left 2006   DCIS  . BREAST LUMPECTOMY WITH NEEDLE LOCALIZATION Right 07/09/2017   Procedure: BREAST LUMPECTOMY WITH NEEDLE LOCALIZATION;  Surgeon: Herbert Pun, MD;  Location: [facility];  Service: General;  Laterality: Right;  . BREAST RECONSTRUCTION WITH PLACEMENT OF TISSUE EXPANDER AND FLEX HD (ACELLULAR HYDRATED DERMIS) Bilateral 11/01/2019   Procedure: BREAST RECONSTRUCTION WITH PLACEMENT OF TISSUE EXPANDER AND FLEX HD (ACELLULAR HYDRATED DERMIS);  Surgeon: Cindra Presume, MD;  Location: [facility];  Service: Plastics;  Laterality: Bilateral;  . BREAST SURGERY     left breast lumpectomy  . CARDIOVERSION N/A 03/21/2016   Procedure: Cardioversion;  Surgeon: Corey Skains, MD;  Location: [facility];  Service: Cardiovascular;  Laterality: N/A;  . CATARACT EXTRACTION, BILATERAL    . DEBRIDEMENT AND CLOSURE WOUND Bilateral 11/26/2019   Procedure: Excision of bilateral mastectomy flap necrosis with complex closure;  Surgeon: Cindra Presume, MD;  Location: [place];  Service: Plastics;  Laterality: Bilateral;  1.5 hours total  . LAPAROSCOPIC CHOLECYSTECTOMY    . TISSUE EXPANDER PLACEMENT Bilateral 11/26/2019   Procedure: exchange of tissue expanders;  Surgeon: Cindra Presume, MD;  Location: [place];  Service: Plastics;  Laterality: Bilateral;  . TONSILLECTOMY    .  TOTAL MASTECTOMY Bilateral 11/01/2019   Procedure: TOTAL MASTECTOMY w/ immediate reconstruction and Sentinel Node Biopsy;  Surgeon: Herbert Pun, MD;  Location: [facility];  Service: General;  Laterality: Bilateral;  . VAGINAL HYSTERECTOMY      Social History   Socioeconomic History  . Marital status: Widowed    Spouse name: Not on file  . Number of children: 3  . Years of education: Not on file  . Highest education level: Not on file  Occupational History  . Not on file  Tobacco Use  . Smoking status: Never Smoker  . Smokeless tobacco: Never Used  Vaping Use  . Vaping Use: Never used  Substance and Sexual Activity  . Alcohol use: Never  . Drug use: No  . Sexual activity: Not Currently  Other Topics Concern  . Not on file  Social History Narrative  . Not on file   Social Determinants of Health   Financial Resource Strain:   . Difficulty of Paying Living Expenses: Not on file  Food Insecurity:   . Worried About Charity fundraiser in the Last Year: Not on file  . Ran Out of Food in the Last Year: Not on file  Transportation Needs:   . Lack of Transportation (Medical): Not on file  . Lack of Transportation (Non-Medical): Not on file  Physical Activity:   . Days of Exercise per Week: Not on file  . Minutes of Exercise per Session: Not on file  Stress:   . Feeling of Stress : Not  on file  Social Connections:   . Frequency of Communication with Friends and Family: Not on file  . Frequency of Social Gatherings with Friends and Family: Not on file  . Attends Religious Services: Not on file  . Active Member of Clubs or Organizations: Not on file  . Attends Archivist Meetings: Not on file  . Marital Status: Not on file  Intimate Partner Violence:   . Fear of Current or Ex-Partner: Not on file  . Emotionally Abused: Not on file  . Physically Abused: Not on file  . Sexually Abused: Not on file    Family History  Problem Relation Age of Onset  .  Diabetes Sister   . Breast cancer Daughter        23's     Current Outpatient Medications:  .  acetaminophen (TYLENOL) 500 MG tablet, Take 500 mg by mouth in the morning and at bedtime., Disp: , Rfl:  .  alendronate (FOSAMAX) 70 MG tablet, TAKE 1 TABLET BY MOUTH ONCE A WEEK. TAKE WITH A FULL GLASS OF WATER ON AN EMPTY STOMACH. (Patient taking differently: Take 70 mg by mouth every Sunday. ), Disp: 12 tablet, Rfl: 1 .  apixaban (ELIQUIS) 5 MG TABS tablet, Take 5 mg by mouth 2 (two) times daily. , Disp: , Rfl:  .  calcium-vitamin D (OSCAL WITH D) 500-200 MG-UNIT TABS tablet, Take 1 tablet by mouth 2 (two) times daily. , Disp: , Rfl:  .  lisinopril (ZESTRIL) 5 MG tablet, Take 1 tablet (5 mg total) by mouth daily., Disp: 90 tablet, Rfl: 1 .  metoprolol succinate (TOPROL-XL) 100 MG 24 hr tablet, Take 1 tablet (100 mg total) by mouth daily. Take with or immediately following a meal., Disp: 90 tablet, Rfl: 1 .  ondansetron (ZOFRAN) 4 MG tablet, Take 4 mg by mouth every 8 (eight) hours as needed., Disp: , Rfl:  .  pravastatin (PRAVACHOL) 20 MG tablet, Take 1 tablet (20 mg total) by mouth daily. (Patient taking differently: Take 20 mg by mouth every evening. ), Disp: 90 tablet, Rfl: 1 .  sertraline (ZOLOFT) 100 MG tablet, Take 1 tablet (100 mg total) by mouth daily., Disp: 90 tablet, Rfl: 1 .  sulfamethoxazole-trimethoprim (BACTRIM DS) 800-160 MG tablet, Take 1 tablet by mouth 2 (two) times daily., Disp: , Rfl:  .  metoCLOPramide (REGLAN) 10 MG tablet, Take 1 tablet (10 mg total) by mouth 4 (four) times daily. 30 mins before meals and at bedtime for nausea, Disp: 60 tablet, Rfl: 0  No results found.  No images are attached to the encounter.   CMP Latest Ref Rng & Units 11/26/2019  Glucose 70 - 99 mg/dL 87  BUN 8 - 23 mg/dL 11  Creatinine 0.44 - 1.00 mg/dL 1.15(H)  Sodium 135 - 145 mmol/L 137  Potassium 3.5 - 5.1 mmol/L 3.9  Chloride 98 - 111 mmol/L 106  CO2 22 - 32 mmol/L 21(L)  Calcium 8.9 -  10.3 mg/dL 8.5(L)  Total Protein 6.5 - 8.1 g/dL -  Total Bilirubin 0.3 - 1.2 mg/dL -  Alkaline Phos 38 - 126 U/L -  AST 15 - 41 U/L -  ALT 0 - 44 U/L -   CBC Latest Ref Rng & Units 11/26/2019  WBC 4.0 - 10.5 K/uL 5.9  Hemoglobin 12.0 - 15.0 g/dL 10.7(L)  Hematocrit 36 - 46 % 33.8(L)  Platelets 150 - 400 K/uL 193     Observation/objective: Appears in no acute distress over video visit today.  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  She is here to discuss final pathology results and further management  Bilateral mastectomy specimen showed residual DCIS in both the breast but did not show any evidence of invasive carcinoma.  Discussed with the patient that following bilateral mastectomy for DCIS cure rate is over 95% and there would be no role for continuing hormone therapy at this time and therefore she can stop taking her tamoxifen.  In terms of her surveillance she will not need any mammograms moving forward and will only need routine chest wall exams.  Follow-up instructions: I will see her back in 6 months  I discussed the assessment and treatment plan with the patient. The patient was provided an opportunity to ask questions and all were answered. The patient agreed with the plan and demonstrated an understanding of the instructions.   The patient was advised to call back or seek an in-person evaluation if the symptoms worsen or if the condition fails to improve as anticipated.   Visit Diagnosis: 1. Ductal carcinoma in situ (DCIS) of breast, unspecified laterality     Dr. Randa Evens, MD, MPH [place] [facility] Tel- [phone] 12/09/2019 1:19 PM

## 2019-12-13 ENCOUNTER — Ambulatory Visit (INDEPENDENT_AMBULATORY_CARE_PROVIDER_SITE_OTHER): Payer: Medicare Other

## 2019-12-13 DIAGNOSIS — Z0000 Encounter for general adult medical examination without abnormal findings: Secondary | ICD-10-CM

## 2019-12-13 NOTE — Progress Notes (Signed)
Subjective:   Melissa Chase is a 79 y.o. female who presents for Medicare Annual (Subsequent) preventive examination.  Virtual Visit via Telephone Note  I connected with  Melissa Chase on 12/13/19 at 11:20 AM EST by telephone and verified that I am speaking with the correct person using two identifiers.  Location: Patient: home Provider: [facility] Persons participating in the virtual visit: patient & her daughter Melissa Chase   I discussed the limitations, risks, security and privacy concerns of performing an evaluation and management service by telephone and the availability of in person appointments. The patient expressed understanding and agreed to proceed.  Interactive audio and video telecommunications were attempted between this nurse and patient, however failed, due to patient having technical difficulties OR patient did not have access to video capability.  We continued and completed visit with audio only.  Some vital signs may be absent or patient reported.   Clemetine Marker, LPN    Review of Systems     Cardiac Risk Factors include: advanced age (>43men, >28 women);dyslipidemia;hypertension     Objective:    There were no vitals filed for this visit. There is no height or weight on file to calculate BMI.  Advanced Directives 12/13/2019 12/07/2019 11/26/2019 11/01/2019 10/26/2019 09/23/2019 03/22/2019  Does Patient Have a Medical Advance Directive? Yes No Yes Yes Yes Yes Yes  Type of Paramedic of [place];Living will - [place];Living will Healthcare Power of [place] of [place];Living will [place];Living will  Does patient want to make changes to medical advance directive? - - No - Patient declined No - Patient declined No - Patient declined - No - Patient declined  Copy of [place] in Chart? No - copy requested - No - copy requested  No - copy requested No - copy requested - No - copy requested  Would patient like information on creating a medical advance directive? - - - - No - Patient declined - -    Current Medications (verified) Outpatient Encounter Medications as of 12/13/2019  Medication Sig  . acetaminophen (TYLENOL) 500 MG tablet Take 500 mg by mouth in the morning and at bedtime.  Marland Kitchen alendronate (FOSAMAX) 70 MG tablet TAKE 1 TABLET BY MOUTH ONCE A WEEK. TAKE WITH A FULL GLASS OF WATER ON AN EMPTY STOMACH. (Patient taking differently: Take 70 mg by mouth every Sunday. )  . apixaban (ELIQUIS) 5 MG TABS tablet Take 5 mg by mouth 2 (two) times daily.   . calcium-vitamin D (OSCAL WITH D) 500-200 MG-UNIT TABS tablet Take 1 tablet by mouth 2 (two) times daily.   Marland Kitchen lisinopril (ZESTRIL) 5 MG tablet Take 1 tablet (5 mg total) by mouth daily.  . metoprolol succinate (TOPROL-XL) 100 MG 24 hr tablet Take 1 tablet (100 mg total) by mouth daily. Take with or immediately following a meal.  . omeprazole (PRILOSEC) 40 MG capsule Take 40 mg by mouth daily.  . ondansetron (ZOFRAN) 4 MG tablet Take 4 mg by mouth every 8 (eight) hours as needed.  . pravastatin (PRAVACHOL) 20 MG tablet Take 1 tablet (20 mg total) by mouth daily. (Patient taking differently: Take 20 mg by mouth every evening. )  . sertraline (ZOLOFT) 100 MG tablet Take 1 tablet (100 mg total) by mouth daily.  . [DISCONTINUED] metoCLOPramide (REGLAN) 10 MG tablet Take 1 tablet (10 mg total) by mouth 4 (four) times daily. 30 mins before meals  and at bedtime for nausea  . [DISCONTINUED] sulfamethoxazole-trimethoprim (BACTRIM DS) 800-160 MG tablet Take 1 tablet by mouth 2 (two) times daily.  . [DISCONTINUED] tamoxifen (NOLVADEX) 20 MG tablet TAKE 1 TABLET BY MOUTH EVERY DAY   No facility-administered encounter medications on file as of 12/13/2019.    Allergies (verified) Patient has no known allergies.   History: Past Medical History:  Diagnosis Date  . Afib ([place])   .  Arthritis   . Breast cancer ([place]) 2006   left breast  . Breast cancer ([place]) 2019   right breast  . Chronic constipation   . Depression   . GERD (gastroesophageal reflux disease)   . Hypertension   . Insomnia   . Insomnia   . Personal history of radiation therapy 2006   37 tx.  . Personal history of radiation therapy 2019   Past Surgical History:  Procedure Laterality Date  . ABDOMINAL HYSTERECTOMY    . APPLICATION OF WOUND VAC  11/01/2019   Procedure: APPLICATION OF WOUND VAC;  Surgeon: Herbert Pun, MD;  Location: [facility] ORS;  Service: General;;  . 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 BREAST BIOPSY Left 10/12/2019   affirm bx of calcs, x marker, path pending  . 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 BREAST LUMPECTOMY Right 07/09/2017   DCIS, clear margins  . BREAST LUMPECTOMY Left 2006   DCIS  . BREAST LUMPECTOMY WITH NEEDLE LOCALIZATION Right 07/09/2017   Procedure: BREAST LUMPECTOMY WITH NEEDLE LOCALIZATION;  Surgeon: Herbert Pun, MD;  Location: [facility] ORS;  Service: General;  Laterality: Right;  . BREAST RECONSTRUCTION WITH PLACEMENT OF TISSUE EXPANDER AND FLEX HD (ACELLULAR HYDRATED DERMIS) Bilateral 11/01/2019   Procedure: BREAST RECONSTRUCTION WITH PLACEMENT OF TISSUE EXPANDER AND FLEX HD (ACELLULAR HYDRATED DERMIS);  Surgeon: Cindra Presume, MD;  Location: [facility] ORS;  Service: Plastics;  Laterality: Bilateral;  . BREAST SURGERY     left breast lumpectomy  . CARDIOVERSION N/A 03/21/2016   Procedure: Cardioversion;  Surgeon: Corey Skains, MD;  Location: [facility] ORS;  Service: Cardiovascular;  Laterality: N/A;  . CATARACT EXTRACTION, BILATERAL    . DEBRIDEMENT AND CLOSURE WOUND Bilateral 11/26/2019   Procedure: Excision of bilateral mastectomy flap necrosis with complex closure;  Surgeon: Cindra Presume, MD;  Location: [place];  Service: Plastics;  Laterality: Bilateral;  1.5 hours  total  . LAPAROSCOPIC CHOLECYSTECTOMY    . TISSUE EXPANDER PLACEMENT Bilateral 11/26/2019   Procedure: exchange of tissue expanders;  Surgeon: Cindra Presume, MD;  Location: [place];  Service: Plastics;  Laterality: Bilateral;  . TONSILLECTOMY    . TOTAL MASTECTOMY Bilateral 11/01/2019   Procedure: TOTAL MASTECTOMY w/ immediate reconstruction and Sentinel Node Biopsy;  Surgeon: Herbert Pun, MD;  Location: [facility] ORS;  Service: General;  Laterality: Bilateral;  . VAGINAL HYSTERECTOMY     Family History  Problem Relation Age of Onset  . Diabetes Sister   . Breast cancer Daughter        56's   Social History   Socioeconomic History  . Marital status: Widowed    Spouse name: Not on file  . Number of children: 3  . Years of education: Not on file  . Highest education level: Not on file  Occupational History  . Not on file  Tobacco Use  . Smoking status: Never Smoker  . Smokeless tobacco: Never Used  Vaping Use  . Vaping Use: Never used  Substance and Sexual Activity  . Alcohol use: Never  . Drug use: No  . Sexual activity: Not Currently  Other Topics Concern  . Not on file  Social History Narrative  . Not on file   Social Determinants of Health   Financial Resource Strain: Low Risk   . Difficulty of Paying Living Expenses: Not hard at all  Food Insecurity: No Food Insecurity  . Worried About Charity fundraiser in the Last Year: Never true  . Ran Out of Food in the Last Year: Never true  Transportation Needs: No Transportation Needs  . Lack of Transportation (Medical): No  . Lack of Transportation (Non-Medical): No  Physical Activity: Inactive  . Days of Exercise per Week: 0 days  . Minutes of Exercise per Session: 0 min  Stress: No Stress Concern Present  . Feeling of Stress : Only a little  Social Connections: Socially Isolated  . Frequency of Communication with Friends and Family: More than three times a week  . Frequency of Social Gatherings with Friends  and Family: More than three times a week  . Attends Religious Services: Never  . Active Member of Clubs or Organizations: No  . Attends Archivist Meetings: Never  . Marital Status: Widowed    Tobacco Counseling Counseling given: Not Answered   Clinical Intake:  Pre-visit preparation completed: Yes  Pain : No/denies pain     Nutritional Risks: Nausea/ vomitting/ diarrhea Diabetes: No  How often do you need to have someone help you when you read instructions, pamphlets, or other written materials from your doctor or pharmacy?: 1 - Never    Interpreter Needed?: No  Information entered by :: Clemetine Marker LPN   Activities of Daily Living In your present state of health, do you have any difficulty performing the following activities: 12/13/2019 11/01/2019  Hearing? N N  Comment declines hearing aids -  Vision? N N  Difficulty concentrating or making decisions? N N  Walking or climbing stairs? N N  Dressing or bathing? N N  Doing errands, shopping? N N  Preparing Food and eating ? N -  Using the Toilet? N -  In the past six months, have you accidently leaked urine? N -  Do you have problems with loss of bowel control? N -  Managing your Medications? N -  Managing your Finances? N -  Housekeeping or managing your Housekeeping? N -  Some recent data might be hidden    Patient Care Team: Juline Patch, MD as PCP - General (Family Medicine)  Indicate any recent Medical Services you may have received from other than [facility] providers in the past year (date may be approximate).     Assessment:   This is a routine wellness examination for [place].  Hearing/Vision screen  Hearing Screening   125Hz  250Hz  500Hz  1000Hz  2000Hz  3000Hz  4000Hz  6000Hz  8000Hz   Right ear:           Left ear:           Comments: Pt declines hearing aids   Vision Screening Comments: Annual vision screening done at the [facility] in [place] issues and exercise  activities discussed: Current Exercise Habits: The patient does not participate in regular exercise at present, Exercise limited by: Other - see comments (post surgery)  Goals    . DIET - INCREASE WATER INTAKE     Recommend drinking 6-8 glasses of water per day      Depression Screen PHQ  2/9 Scores 12/13/2019 10/27/2019 08/19/2019 05/25/2019 02/25/2019 11/30/2018 11/18/2018  PHQ - 2 Score 0 0 0 0 0 1 2  PHQ- 9 Score 0 0 0 0 0 2 9    Fall Risk Fall Risk  12/13/2019 10/27/2019 08/19/2019 05/25/2019 11/18/2018  Falls in the past year? 0 0 0 0 1  Number falls in past yr: 0 - - - 1  Comment - - - - -  Injury with Fall? 0 - - - 1  Risk for fall due to : No Fall Risks - - - Impaired balance/gait;History of fall(s)  Follow up Falls prevention discussed Falls evaluation completed Falls evaluation completed Falls evaluation completed Falls prevention discussed    Any stairs in or around the home? Yes  If so, are there any without handrails? No  Home free of loose throw rugs in walkways, pet beds, electrical cords, etc? Yes  Adequate lighting in your home to reduce risk of falls? Yes   ASSISTIVE DEVICES UTILIZED TO PREVENT FALLS:  Life alert? No  Use of a cane, walker or w/c? No  Grab bars in the bathroom? Yes  Shower chair or bench in shower? Yes  Elevated toilet seat or a handicapped toilet? Yes   TIMED UP AND GO:  Was the test performed? No . Telephonic visit.   Cognitive Function:     6CIT Screen 12/13/2019 11/18/2018  What Year? 0 points 0 points  What month? 0 points 0 points  What time? 0 points 0 points  Count back from 20 0 points 0 points  Months in reverse 4 points 0 points  Repeat phrase 2 points 2 points  Total Score 6 2    Immunizations Immunization History  Administered Date(s) Administered  . Influenza,inj,Quad PF,6+ Mos 01/01/2013, 12/02/2013, 12/05/2014  . Influenza-Unspecified 12/02/2013  . Pneumococcal Conjugate-13 10/22/2018    TDAP status: Due, Education  has been provided regarding the importance of this vaccine. Advised may receive this vaccine at local pharmacy or Health Dept. Aware to provide a copy of the vaccination record if obtained from local pharmacy or Health Dept. Verbalized acceptance and understanding.   Flu Vaccine status: Declined, Education has been provided regarding the importance of this vaccine but patient still declined. Advised may receive this vaccine at local pharmacy or Health Dept. Aware to provide a copy of the vaccination record if obtained from local pharmacy or Health Dept. Verbalized acceptance and understanding.   Pneumococcal vaccine status: due for PPSV23  Covid-19 vaccine status: Declined, Education has been provided regarding the importance of this vaccine but patient still declined. Advised may receive this vaccine at local pharmacy or Health Dept.or vaccine clinic. Aware to provide a copy of the vaccination record if obtained from local pharmacy or Health Dept. Verbalized acceptance and understanding.  Qualifies for Shingles Vaccine? Yes   Zostavax completed No   Shingrix Completed?: No.    Education has been provided regarding the importance of this vaccine. Patient has been advised to call insurance company to determine out of pocket expense if they have not yet received this vaccine. Advised may also receive vaccine at local pharmacy or Health Dept. Verbalized acceptance and understanding.  Screening Tests Health Maintenance  Topic Date Due  . PNA vac Low Risk Adult (2 of 2 - PPSV23) 10/22/2019  . COVID-19 Vaccine (1) 12/29/2019 (Originally 01/10/1953)  . INFLUENZA VACCINE  04/20/2020 (Originally 08/22/2019)  . Hepatitis C Screening  08/18/2020 (Originally 1941/01/04)  . TETANUS/TDAP  10/26/2020 (Originally 01/10/1960)  . DEXA SCAN  Completed    Health Maintenance  Health Maintenance Due  Topic Date Due  . PNA vac Low Risk Adult (2 of 2 - PPSV23) 10/22/2019   Colorectal cancer screening status: no  longer required due to age.   Mammogram status: No longer required.  due to bilateral mastectomy.   Bone Density status: Completed 10/07/19. Results reflect: Bone density results: OSTEOPENIA. Repeat every 2 years.  Lung Cancer Screening: (Low Dose CT Chest recommended if Age 54-80 years, 30 pack-year currently smoking OR have quit w/in 15years.) does not qualify.   Additional Screening:  Hepatitis C Screening: does qualify; postponed  Vision Screening: Recommended annual ophthalmology exams for early detection of glaucoma and other disorders of the eye. Is the patient up to date with their annual eye exam?  Yes  Who is the provider or what is the name of the office in which the patient attends annual eye exams? [facility] in [place]: Recommended annual dental exams for proper oral hygiene  Community Resource Referral / Chronic Care Management: CRR required this visit?  No   CCM required this visit?  No      Plan:     I have personally reviewed and noted the following in the patient's chart:   . Medical and social history . Use of alcohol, tobacco or illicit drugs  . Current medications and supplements . Functional ability and status . Nutritional status . Physical activity . Advanced directives . List of other physicians . Hospitalizations, surgeries, and ER visits in previous 12 months . Vitals . Screenings to include cognitive, depression, and falls . Referrals and appointments  In addition, I have reviewed and discussed with patient certain preventive protocols, quality metrics, and best practice recommendations. A written personalized care plan for preventive services as well as general preventive health recommendations were provided to patient.     Clemetine Marker, LPN   [id number]/10/[id number]   Nurse Notes: none

## 2019-12-13 NOTE — Patient Instructions (Signed)
Ms. Melissa Chase , Thank you for taking time to come for your Medicare Wellness Visit. I appreciate your ongoing commitment to your health goals. Please review the following plan we discussed and let me know if I can assist you in the future.   Screening recommendations/referrals: Colonoscopy: no longer required Bone Density: done 10/07/19 Recommended yearly ophthalmology/optometry visit for glaucoma screening and checkup Recommended yearly dental visit for hygiene and checkup  Vaccinations: Influenza vaccine: due Pneumococcal vaccine: done 10/22/18 due for Pneumovax23 Tdap vaccine: due Shingles vaccine: Shingrix discussed. Please contact your pharmacy for coverage information.  Covid-19:declined  Advanced directives: Please bring a copy of your health care power of attorney and living will to the office at your convenience.  Conditions/risks identified: Recommend increasing physical activity as tolerated  Next appointment: Follow up in one year for your annual wellness visit    Preventive Care 65 Years and Older, Female Preventive care refers to lifestyle choices and visits with your health care provider that can promote health and wellness. What does preventive care include?  A yearly physical exam. This is also called an annual well check.  Dental exams once or twice a year.  Routine eye exams. Ask your health care provider how often you should have your eyes checked.  Personal lifestyle choices, including:  Daily care of your teeth and gums.  Regular physical activity.  Eating a healthy diet.  Avoiding tobacco and drug use.  Limiting alcohol use.  Practicing safe sex.  Taking low-dose aspirin every day.  Taking vitamin and mineral supplements as recommended by your health care provider. What happens during an annual well check? The services and screenings done by your health care provider during your annual well check will depend on your age, overall health, lifestyle  risk factors, and family history of disease. Counseling  Your health care provider may ask you questions about your:  Alcohol use.  Tobacco use.  Drug use.  Emotional well-being.  Home and relationship well-being.  Sexual activity.  Eating habits.  History of falls.  Memory and ability to understand (cognition).  Work and work Statistician.  Reproductive health. Screening  You may have the following tests or measurements:  Height, weight, and BMI.  Blood pressure.  Lipid and cholesterol levels. These may be checked every 5 years, or more frequently if you are over 79 years old.  Skin check.  Lung cancer screening. You may have this screening every year starting at age 67 if you have a 30-pack-year history of smoking and currently smoke or have quit within the past 15 years.  Fecal occult blood test (FOBT) of the stool. You may have this test every year starting at age 54.  Flexible sigmoidoscopy or colonoscopy. You may have a sigmoidoscopy every 5 years or a colonoscopy every 10 years starting at age 66.  Hepatitis C blood test.  Hepatitis B blood test.  Sexually transmitted disease (STD) testing.  Diabetes screening. This is done by checking your blood sugar (glucose) after you have not eaten for a while (fasting). You may have this done every 1-3 years.  Bone density scan. This is done to screen for osteoporosis. You may have this done starting at age 47.  Mammogram. This may be done every 1-2 years. Talk to your health care provider about how often you should have regular mammograms. Talk with your health care provider about your test results, treatment options, and if necessary, the need for more tests. Vaccines  Your health care provider may recommend  certain vaccines, such as:  Influenza vaccine. This is recommended every year.  Tetanus, diphtheria, and acellular pertussis (Tdap, Td) vaccine. You may need a Td booster every 10 years.  Zoster vaccine.  You may need this after age 24.  Pneumococcal 13-valent conjugate (PCV13) vaccine. One dose is recommended after age 56.  Pneumococcal polysaccharide (PPSV23) vaccine. One dose is recommended after age 72. Talk to your health care provider about which screenings and vaccines you need and how often you need them. This information is not intended to replace advice given to you by your health care provider. Make sure you discuss any questions you have with your health care provider. Document Released: 02/03/2015 Document Revised: 09/27/2015 Document Reviewed: 11/08/2014 Elsevier Interactive Patient Education  2017 [place] Prevention in the Home Falls can cause injuries. They can happen to people of all ages. There are many things you can do to make your home safe and to help prevent falls. What can I do on the outside of my home?  Regularly fix the edges of walkways and driveways and fix any cracks.  Remove anything that might make you trip as you walk through a door, such as a raised step or threshold.  Trim any bushes or trees on the path to your home.  Use bright outdoor lighting.  Clear any walking paths of anything that might make someone trip, such as rocks or tools.  Regularly check to see if handrails are loose or broken. Make sure that both sides of any steps have handrails.  Any raised decks and porches should have guardrails on the edges.  Have any leaves, snow, or ice cleared regularly.  Use sand or salt on walking paths during winter.  Clean up any spills in your garage right away. This includes oil or grease spills. What can I do in the bathroom?  Use night lights.  Install grab bars by the toilet and in the tub and shower. Do not use towel bars as grab bars.  Use non-skid mats or decals in the tub or shower.  If you need to sit down in the shower, use a plastic, non-slip stool.  Keep the floor dry. Clean up any water that spills on the floor as soon  as it happens.  Remove soap buildup in the tub or shower regularly.  Attach bath mats securely with double-sided non-slip rug tape.  Do not have throw rugs and other things on the floor that can make you trip. What can I do in the bedroom?  Use night lights.  Make sure that you have a light by your bed that is easy to reach.  Do not use any sheets or blankets that are too big for your bed. They should not hang down onto the floor.  Have a firm chair that has side arms. You can use this for support while you get dressed.  Do not have throw rugs and other things on the floor that can make you trip. What can I do in the kitchen?  Clean up any spills right away.  Avoid walking on wet floors.  Keep items that you use a lot in easy-to-reach places.  If you need to reach something above you, use a strong step stool that has a grab bar.  Keep electrical cords out of the way.  Do not use floor polish or wax that makes floors slippery. If you must use wax, use non-skid floor wax.  Do not have throw rugs and other  things on the floor that can make you trip. What can I do with my stairs?  Do not leave any items on the stairs.  Make sure that there are handrails on both sides of the stairs and use them. Fix handrails that are broken or loose. Make sure that handrails are as long as the stairways.  Check any carpeting to make sure that it is firmly attached to the stairs. Fix any carpet that is loose or worn.  Avoid having throw rugs at the top or bottom of the stairs. If you do have throw rugs, attach them to the floor with carpet tape.  Make sure that you have a light switch at the top of the stairs and the bottom of the stairs. If you do not have them, ask someone to add them for you. What else can I do to help prevent falls?  Wear shoes that:  Do not have high heels.  Have rubber bottoms.  Are comfortable and fit you well.  Are closed at the toe. Do not wear sandals.  If  you use a stepladder:  Make sure that it is fully opened. Do not climb a closed stepladder.  Make sure that both sides of the stepladder are locked into place.  Ask someone to hold it for you, if possible.  Clearly mark and make sure that you can see:  Any grab bars or handrails.  First and last steps.  Where the edge of each step is.  Use tools that help you move around (mobility aids) if they are needed. These include:  Canes.  Walkers.  Scooters.  Crutches.  Turn on the lights when you go into a dark area. Replace any light bulbs as soon as they burn out.  Set up your furniture so you have a clear path. Avoid moving your furniture around.  If any of your floors are uneven, fix them.  If there are any pets around you, be aware of where they are.  Review your medicines with your doctor. Some medicines can make you feel dizzy. This can increase your chance of falling. Ask your doctor what other things that you can do to help prevent falls. This information is not intended to replace advice given to you by your health care provider. Make sure you discuss any questions you have with your health care provider. Document Released: 11/03/2008 Document Revised: 06/15/2015 Document Reviewed: 02/11/2014 Elsevier Interactive Patient Education  2017 [employer].

## 2019-12-19 NOTE — Progress Notes (Signed)
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 Claudia Desanctis.  Plan to wait approximately 6 to 8 weeks postop to start expanding so as to not overstress the mastectomy flaps.  ~ 4 weeks PO Patient presents today with her daughter.  Reports she is feeling much better.  Reports she has gone back to work.  Denies fever/chills, vomiting.  Reports nausea has improved.  Takes occasional nausea medicine.  Reports she has been able to eat normally, is drinking 1 boost shake a day for increased protein, and has started having normal bowel movements again.  Steri-Strips were removed today.  Bilateral incisions are intact, clean and dry.  No signs of infection or drainage.  She has areas of bilateral erythema under the medial breasts.  Left drain output has been less than 30 cc for the last week.  Right drain output has been more than 40 cc.  Left drain was removed today.  She has some limited range of motion of bilateral arms.   Continue to wear compression 24/7.  Continue to avoid heavy lifting.  May do " wall walker" PT exercise to help with ROM. May consider PT at a later time if needed. May shower normally; avoid scrubbing the incisions. Follow-up next week for possible drain removal. Return precautions given. Call office with any questions/concerns.

## 2019-12-22 ENCOUNTER — Encounter: Payer: Self-pay | Admitting: Plastic Surgery

## 2019-12-22 ENCOUNTER — Ambulatory Visit (INDEPENDENT_AMBULATORY_CARE_PROVIDER_SITE_OTHER): Payer: Medicare Other | Admitting: Plastic Surgery

## 2019-12-22 ENCOUNTER — Other Ambulatory Visit: Payer: Self-pay

## 2019-12-22 VITALS — BP 121/73 | HR 86 | Temp 98.2°F

## 2019-12-22 DIAGNOSIS — Z9013 Acquired absence of bilateral breasts and nipples: Secondary | ICD-10-CM

## 2019-12-27 NOTE — Progress Notes (Signed)
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 Claudia Desanctis on 11/26/19. Plan to wait approximately 6-8 weeks to start expanding so as to not overstress the mastectomy flaps.  ~ 5 weeks PO Patient is accompanied today by her daughter.  Reports overall well she is doing well.  Denies fever/chills, vomiting.  Reports nausea has continued to improve and she is able to eat regularly at this point.  She is also continuing to do a daily boost shake to help with her protein intake. reports drain output for last week has been approximately 41 cc or less.  It was 31 cc over the last 24 hours.  Bilateral incisions are intact, clean and dry.  There is no signs of infection or drainage.  She has remaining areas of erythema below bilateral incisions.  Area on the right side, fold of tissue expander is palpable under the area of redness.  Right drain was removed today after discussion with Dr. Claudia Desanctis.  Continue to wear compression garment 24/7.  Provided Rx for [facility].  Continue to avoid heavy lifting or vigorous activity.  May shower normally; avoid scrubbing of the incisions.  Follow-up in 3 to 4 weeks.  Call office with any questions/concerns.  Return precautions provided.   Pictures were obtained of the patient and placed in the chart with the patient's or guardian's permission.

## 2019-12-29 ENCOUNTER — Encounter: Payer: Self-pay | Admitting: Plastic Surgery

## 2019-12-29 ENCOUNTER — Ambulatory Visit (INDEPENDENT_AMBULATORY_CARE_PROVIDER_SITE_OTHER): Payer: Medicare Other | Admitting: Plastic Surgery

## 2019-12-29 ENCOUNTER — Other Ambulatory Visit: Payer: Self-pay

## 2019-12-29 VITALS — BP 112/68 | HR 78 | Temp 98.4°F

## 2019-12-29 DIAGNOSIS — Z9013 Acquired absence of bilateral breasts and nipples: Secondary | ICD-10-CM

## 2019-12-30 ENCOUNTER — Telehealth: Payer: Self-pay

## 2019-12-30 NOTE — Telephone Encounter (Signed)
Called [facility] and advised patient needs more ABD pads. Spoke with Santa Genera and she indicated patient purchased the last order due to insurance does not cover this item because of the thickness of the wound. CSR will call patient to find out how many she needs to purchase.

## 2020-01-17 ENCOUNTER — Encounter: Payer: Self-pay | Admitting: *Deleted

## 2020-01-17 NOTE — Progress Notes (Signed)
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 Claudia Desanctis on 11/26/19.  Plan to wait 6-8 weeks prior to expansion. At last visit on 12/08, she had small area of erythema inferior to the right breast incision where a fold of the tissue expander is palpated. Erythema present inferior to the incision on the left medial breast. No signs of infection.  ~ 8 weeks PO She presents today with her daughter. Reports her son passed away last week after being in the hospital 3 weeks due to congestive heart failure. Reports otherwise she is doing well. Reports she is feeling and eating well. Denies fever,chills, nausea, vomiting, and pain. Bilateral incisions are healing well, c/d/i.  No signs of infection or drainage. Erythema remains present on inferior portion of breasts. They report the sports bra presses and rolls there. They are going to [employer] to purchase some compression tanks today. There is limited laxity of the skin.   Follow up in 2 weeks with Dr. Claudia Desanctis to determine if she can start fills. Wear compression tanks during the day, but does not have to wear compression at night.  May shower normally. May gradually resume normal activities as tolerated. Return precautions provided. All office with any questions/concerns.   Pictures were obtained of the patient and placed in the chart with the patient's or guardian's permission.

## 2020-01-20 ENCOUNTER — Encounter: Payer: Self-pay | Admitting: Plastic Surgery

## 2020-01-20 ENCOUNTER — Other Ambulatory Visit: Payer: Self-pay | Admitting: Oncology

## 2020-01-20 ENCOUNTER — Ambulatory Visit (INDEPENDENT_AMBULATORY_CARE_PROVIDER_SITE_OTHER): Payer: Medicare Other | Admitting: Plastic Surgery

## 2020-01-20 ENCOUNTER — Other Ambulatory Visit: Payer: Self-pay

## 2020-01-20 VITALS — BP 111/65 | HR 62 | Temp 97.7°F

## 2020-01-20 DIAGNOSIS — C50912 Malignant neoplasm of unspecified site of left female breast: Secondary | ICD-10-CM

## 2020-01-20 DIAGNOSIS — Z9013 Acquired absence of bilateral breasts and nipples: Secondary | ICD-10-CM

## 2020-01-28 ENCOUNTER — Other Ambulatory Visit: Payer: Self-pay

## 2020-01-28 ENCOUNTER — Ambulatory Visit (INDEPENDENT_AMBULATORY_CARE_PROVIDER_SITE_OTHER): Payer: Medicare Other | Admitting: Plastic Surgery

## 2020-01-28 ENCOUNTER — Encounter: Payer: Self-pay | Admitting: Plastic Surgery

## 2020-01-28 VITALS — BP 125/70 | HR 67 | Temp 98.2°F

## 2020-01-28 DIAGNOSIS — S21002A Unspecified open wound of left breast, initial encounter: Secondary | ICD-10-CM

## 2020-01-28 DIAGNOSIS — Z9013 Acquired absence of bilateral breasts and nipples: Secondary | ICD-10-CM

## 2020-01-28 NOTE — Progress Notes (Signed)
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 Claudia Desanctis on 11/26/2019.    ~ 9 weeks PO Today patient presents with her granddaughter.  She called this morning reporting significant yellow drainage from the left breast.  We brought her in and upon exam the skin had opened exposing the tissue expander ( a small fold).  There is serous drainage present.  No increase to the erythema present.  No current signs of infection. Patient reports it's slightly tender. Denies fever, chills, nausea, and vomiting.   We discussed that the tissue expander cannot remained exposed and so would have to be addressed.  We discussed her options including pros and cons of each.  Patient reports that she feels she tried and at this time would like to be done and just have the tissue expanders removed.  She would like to be able to move forward from this and live her life healthy instead of continuing to try with reconstruction.  She is interested in obtaining prosthetics from [employer] after the tissue expanders are removed.  We conferenced in her daughter via telephone for further discussion.  All parties are in agreement with removing bilateral tissue expanders and no longer pursuing any type of reconstruction.  I think this is very reasonable.  Will discuss with Dr. Claudia Desanctis.  We will begin working to schedule removal surgery.  Follow-up as needed prior to surgery.  Call office with any questions/concerns.  Return precautions provided with emphasis to keep an eye out for any signs of infection.   Steri-strips were placed to help remove any tension from the opening and covered with ABS. Replace ABS as necessary.  Pictures were obtained of the patient and placed in the chart with the patient's or guardian's permission.

## 2020-01-28 NOTE — H&P (View-Only) (Signed)
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 Pace on 11/26/2019.    ~ 9 weeks PO Today patient presents with her granddaughter.  She called this morning reporting significant yellow drainage from the left breast.  We brought her in and upon exam the skin had opened exposing the tissue expander ( a small fold).  There is serous drainage present.  No increase to the erythema present.  No current signs of infection. Patient reports it's slightly tender. Denies fever, chills, nausea, and vomiting.   We discussed that the tissue expander cannot remained exposed and so would have to be addressed.  We discussed her options including pros and cons of each.  Patient reports that she feels she tried and at this time would like to be done and just have the tissue expanders removed.  She would like to be able to move forward from this and live her life healthy instead of continuing to try with reconstruction.  She is interested in obtaining prosthetics from [employer] after the tissue expanders are removed.  We conferenced in her daughter via telephone for further discussion.  All parties are in agreement with removing bilateral tissue expanders and no longer pursuing any type of reconstruction.  I think this is very reasonable.  Will discuss with Dr. Pace.  We will begin working to schedule removal surgery.  Follow-up as needed prior to surgery.  Call office with any questions/concerns.  Return precautions provided with emphasis to keep an eye out for any signs of infection.   Steri-strips were placed to help remove any tension from the opening and covered with ABS. Replace ABS as necessary.  Pictures were obtained of the patient and placed in the chart with the patient's or guardian's permission.   

## 2020-01-31 ENCOUNTER — Other Ambulatory Visit: Payer: Self-pay

## 2020-01-31 ENCOUNTER — Telehealth: Payer: Self-pay | Admitting: *Deleted

## 2020-01-31 ENCOUNTER — Encounter (HOSPITAL_COMMUNITY): Payer: Self-pay | Admitting: Plastic Surgery

## 2020-01-31 ENCOUNTER — Telehealth: Payer: Self-pay

## 2020-01-31 ENCOUNTER — Other Ambulatory Visit
Admission: RE | Admit: 2020-01-31 | Discharge: 2020-01-31 | Disposition: A | Discharge status: Home / Self Care | Payer: Medicare Other | Source: Ambulatory Visit | Attending: Plastic Surgery | Admitting: Plastic Surgery

## 2020-01-31 DIAGNOSIS — Z01812 Encounter for preprocedural laboratory examination: Secondary | ICD-10-CM | POA: Diagnosis not present

## 2020-01-31 DIAGNOSIS — Z20822 Contact with and (suspected) exposure to covid-19: Secondary | ICD-10-CM | POA: Diagnosis not present

## 2020-01-31 LAB — SARS CORONAVIRUS 2 (TAT 6-24 HRS): SARS Coronavirus 2: NEGATIVE

## 2020-01-31 NOTE — Progress Notes (Addendum)
Denies chest pain or shortness of breath. Reports that cardiologist is Dr. Nehemiah Massed. Notes in care everywhere. Reports being in quarantine since COVID test. Educated on visitation policy. Told patient to hold Eliquis until after surgery and notified Dr. Claudia Desanctis and [place], [place] of same.

## 2020-01-31 NOTE — Anesthesia Preprocedure Evaluation (Addendum)
Anesthesia Evaluation  Patient identified by MRN, date of birth, ID band Patient awake    Reviewed: Allergy & Precautions, NPO status , Patient's Chart, lab work & pertinent test results, reviewed documented beta blocker date and time   History of Anesthesia Complications Negative for: history of anesthetic complications  Airway Mallampati: I  TM Distance: >3 FB Neck ROM: Full    Dental  (+) Dental Advisory Given, Teeth Intact   Pulmonary  01/31/2020 SARS coronavirus NEG   breath sounds clear to auscultation       Cardiovascular hypertension, Pt. on medications and Pt. on home beta blockers (-) angina+ dysrhythmias Atrial Fibrillation  Rhythm:Irregular Rate:Normal  02/10/2019 stress: no evidence of stress-induced ischemia or arrhythmia  01/2019 ECHO:  mild LV systolic dysfunction, EF 84-16%; moderate MR, mod TR   Neuro/Psych Depression negative neurological ROS     GI/Hepatic Neg liver ROS, GERD  Controlled,  Endo/Other  Morbid obesity  Renal/GU negative Renal ROS     Musculoskeletal   Abdominal (+) + obese,   Peds  Hematology eliquis   Anesthesia Other Findings   Reproductive/Obstetrics                           Anesthesia Physical Anesthesia Plan  ASA: III  Anesthesia Plan: General   Post-op Pain Management:    Induction: Intravenous  PONV Risk Score and Plan: 3 and Ondansetron, Dexamethasone and Treatment may vary due to age or medical condition  Airway Management Planned: LMA  Additional Equipment: None  Intra-op Plan:   Post-operative Plan:   Informed Consent: I have reviewed the patients History and Physical, chart, labs and discussed the procedure including the risks, benefits and alternatives for the proposed anesthesia with the patient or authorized representative who has indicated his/her understanding and acceptance.     Dental advisory given  Plan Discussed  with: CRNA and Surgeon  Anesthesia Plan Comments: (PAT note by Karoline Caldwell, PA-C: Follows with cardiologist Dr. Nehemiah Massed at [facility] for hx of dilated cardiomyopathy, A. Fib (on apixaban), valvular insufficiency, HTN, HLD, pulmonary hypertension. Last seen 09/08/2019, per note patient climbs stairs and walks briskly without angina/anginal equivalent symptoms. TTE in 01/2019 revealed mild LV systolic dysfunction with an estimated EF of 40-45%; moderate valvular insufficiency. Exercise stress test 02/10/2019 revealed no evidence of stress-induced ischemia or arrhythmia.  Recommended follow-up in 6 months.  Recommendations are for repeat TTE in 01/2020 for surveillance related to patient's known valvular disease.  Recently underwent BILATERAL total mastectomy with immediate breast reconstruction on 11/01/2019 with Dr. Windell Moment and Dr. Claudia Desanctis.  Prior to surgery pt was cleared by both cardiology and PCP.  According to cardiology, patient was "optimized for surgery and may proceed with planned procedure with a LOW risk stratification".  Per PCP note by Otilio Miu, MD 10/27/2019 pt was "currently stable to proceed with surgery."  Pt underwent surgery without complication on 60/63/0160.  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 Claudia Desanctis on 11/26/2019.    Surgery follow-up note 01/28/2020 patient has exposed portions of her tissue expander that need to be addressed.  Patient reported last dose of apixaban morning of 01/31/2019.  Will need day of surgery labs and evaluation.  EKG 07/31/19: NSR.  Rate 60.  TTE 02/08/2019 (care everywhere): LVEF 10% Mild LV systolic dysfunction Normal RV systolic function Moderate tricuspid and mitral valve insufficiency Mild aortic valve insufficiency No valvular stenosis Mild biatrial enlargement Mild  LV enlargement Mild RV enlargement Moderate pulmonary hypertension  EXERCISE STRESS TEST 02/04/2019  (care everywhere): METS 7.0 Resting VS: heart rate 70 bpm and blood pressure 142/78 mmHg Stress VS: Heart rate 125 (87% maximal age-predicted heart rate) and blood pressure 190/80 mmHg. No chest pain, arrhythmias, or ST changes noted during the study Normal treadmill ECG without evidence of ischemia or arrhythmia  )      Anesthesia Quick Evaluation

## 2020-01-31 NOTE — Telephone Encounter (Signed)
Faxed order to [facility] on (01/28/20) for medical supplies for the patient.  Confirmation received and copy scanned into the chart.//AB/CMA

## 2020-01-31 NOTE — Telephone Encounter (Signed)
Patient's daughter called to say that her mom has a gaping hole that has come open and looks like there are some clots and blood is coming out of her drain pretty steadily.  Please call.  Also, would like to let [facility] know that they haven't heard about surgery yet.

## 2020-01-31 NOTE — Progress Notes (Signed)
Anesthesia Chart Review: Same day workup  Follows with cardiologist Dr. Nehemiah Massed at [facility] for hx of dilated cardiomyopathy, A. Fib (on apixaban), valvular insufficiency, HTN, HLD, pulmonary hypertension. Last seen 09/08/2019, per note patient climbs stairs and walks briskly without angina/anginal equivalent symptoms. TTE in 01/2019 revealed mild LV systolic dysfunction with an estimated EF of 40-45%; moderate valvular insufficiency. Exercise stress test 02/10/2019 revealed no evidence of stress-induced ischemia or arrhythmia.  Recommended follow-up in 6 months.  Recommendations are for repeat TTE in 01/2020 for surveillance related to patient's known valvular disease.  Recently underwent BILATERAL total mastectomy with immediate breast reconstruction on 11/01/2019 with Dr. Windell Moment and Dr. Claudia Desanctis.  Prior to surgery pt was cleared by both cardiology and PCP.  According to cardiology, patient was "optimized for surgery and may proceed with planned procedure with a LOW risk stratification".  Per PCP note by Otilio Miu, MD 10/27/2019 pt was "currently stable to proceed with surgery."  Pt underwent surgery without complication on 63/84/6659.  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 Claudia Desanctis on 11/26/2019.    Surgery follow-up note 01/28/2020 patient has exposed portions of her tissue expander that need to be addressed.  Patient reported last dose of apixaban morning of 01/31/2019.  Will need day of surgery labs and evaluation.  EKG 07/31/19: NSR.  Rate 60.  TTE 02/08/2019 (care everywhere): 1. LVEF 45% 2. Mild LV systolic dysfunction 3. Normal RV systolic function 4. Moderate tricuspid and mitral valve insufficiency 5. Mild aortic valve insufficiency 6. No valvular stenosis 7. Mild biatrial enlargement 8. Mild LV enlargement 9. Mild RV enlargement 10. Moderate pulmonary hypertension  EXERCISE STRESS TEST 02/04/2019 (care  everywhere): 1. METS 7.0 2. Resting VS: heart rate 70 bpm and blood pressure 142/78 mmHg 3. Stress VS: Heart rate 125 (87% maximal age-predicted heart rate) and blood pressure 190/80 mmHg. 4. No chest pain, arrhythmias, or ST changes noted during the study 5. Normal treadmill ECG without evidence of ischemia or arrhythmia   Wynonia Musty [facility]/Anesthesiology Phone [phone] 01/31/2020 3:00 PM

## 2020-01-31 NOTE — Telephone Encounter (Signed)
Called and spoke with the patient's daughter Melissa Chase) regarding the message below.  [name] stated that another hole has come open.    Informed the daughter that I spoke with [facility] and she would like for the patient to use vaseline and gauze on those areas that have opened up.  The daughter verbalized understanding and agreed.  Informed the daughter that Ewing Schlein is on the phone now checking about the surgery date.  The daughter stated that after she called [place] regarding the opening she received a call regarding the surgery time.  Which is tomorrow morning.  The patient has to be there at 7:15am and the surgery will be at 9:15am.  Informed the daughter that I will let [id number],WP-V know.//AB/CMA

## 2020-02-01 ENCOUNTER — Ambulatory Visit (HOSPITAL_COMMUNITY)
Admission: RE | Admit: 2020-02-01 | Discharge: 2020-02-01 | Disposition: A | Discharge status: Home / Self Care | Payer: Medicare Other | Attending: Plastic Surgery | Admitting: Plastic Surgery

## 2020-02-01 ENCOUNTER — Other Ambulatory Visit: Payer: Self-pay

## 2020-02-01 ENCOUNTER — Encounter (HOSPITAL_COMMUNITY)
Admission: RE | Disposition: A | Discharge status: Home / Self Care | Payer: Self-pay | Source: Home / Self Care | Attending: Plastic Surgery

## 2020-02-01 ENCOUNTER — Ambulatory Visit (HOSPITAL_COMMUNITY): Payer: Medicare Other | Admitting: Anesthesiology

## 2020-02-01 ENCOUNTER — Encounter (HOSPITAL_COMMUNITY): Payer: Self-pay | Admitting: Plastic Surgery

## 2020-02-01 DIAGNOSIS — S21101A Unspecified open wound of right front wall of thorax without penetration into thoracic cavity, initial encounter: Secondary | ICD-10-CM | POA: Diagnosis not present

## 2020-02-01 DIAGNOSIS — I272 Pulmonary hypertension, unspecified: Secondary | ICD-10-CM | POA: Diagnosis not present

## 2020-02-01 DIAGNOSIS — I10 Essential (primary) hypertension: Secondary | ICD-10-CM | POA: Diagnosis not present

## 2020-02-01 DIAGNOSIS — Z7901 Long term (current) use of anticoagulants: Secondary | ICD-10-CM | POA: Diagnosis not present

## 2020-02-01 DIAGNOSIS — I4891 Unspecified atrial fibrillation: Secondary | ICD-10-CM | POA: Diagnosis not present

## 2020-02-01 DIAGNOSIS — E785 Hyperlipidemia, unspecified: Secondary | ICD-10-CM | POA: Diagnosis not present

## 2020-02-01 DIAGNOSIS — T8549XA Other mechanical complication of breast prosthesis and implant, initial encounter: Secondary | ICD-10-CM | POA: Diagnosis not present

## 2020-02-01 DIAGNOSIS — Z9013 Acquired absence of bilateral breasts and nipples: Secondary | ICD-10-CM | POA: Diagnosis not present

## 2020-02-01 DIAGNOSIS — Y818 Miscellaneous general- and plastic-surgery devices associated with adverse incidents, not elsewhere classified: Secondary | ICD-10-CM | POA: Insufficient documentation

## 2020-02-01 DIAGNOSIS — S21102A Unspecified open wound of left front wall of thorax without penetration into thoracic cavity, initial encounter: Secondary | ICD-10-CM | POA: Diagnosis not present

## 2020-02-01 DIAGNOSIS — Z45819 Encounter for adjustment or removal of unspecified breast implant: Secondary | ICD-10-CM | POA: Diagnosis not present

## 2020-02-01 DIAGNOSIS — Z923 Personal history of irradiation: Secondary | ICD-10-CM | POA: Insufficient documentation

## 2020-02-01 DIAGNOSIS — I420 Dilated cardiomyopathy: Secondary | ICD-10-CM | POA: Insufficient documentation

## 2020-02-01 DIAGNOSIS — S21109A Unspecified open wound of unspecified front wall of thorax without penetration into thoracic cavity, initial encounter: Secondary | ICD-10-CM | POA: Diagnosis not present

## 2020-02-01 DIAGNOSIS — Z853 Personal history of malignant neoplasm of breast: Secondary | ICD-10-CM | POA: Diagnosis not present

## 2020-02-01 HISTORY — PX: TISSUE EXPANDER PLACEMENT: SHX2530

## 2020-02-01 HISTORY — DX: Rheumatic tricuspid insufficiency: I07.1

## 2020-02-01 HISTORY — DX: Nonrheumatic mitral (valve) insufficiency: I34.0

## 2020-02-01 HISTORY — DX: Cardiomyopathy, unspecified: I42.9

## 2020-02-01 LAB — BASIC METABOLIC PANEL
Anion gap: 10 (ref 5–15)
BUN: 20 mg/dL (ref 8–23)
CO2: 28 mmol/L (ref 22–32)
Calcium: 9.4 mg/dL (ref 8.9–10.3)
Chloride: 100 mmol/L (ref 98–111)
Creatinine, Ser: 1.07 mg/dL — ABNORMAL HIGH (ref 0.44–1.00)
GFR, Estimated: 53 mL/min — ABNORMAL LOW (ref >60)
Glucose, Bld: 99 mg/dL (ref 70–99)
Potassium: 3.7 mmol/L (ref 3.5–5.1)
Sodium: 138 mmol/L (ref 135–145)

## 2020-02-01 LAB — CBC
HCT: 37.3 % (ref 36.0–46.0)
Hemoglobin: 11.6 g/dL — ABNORMAL LOW (ref 12.0–15.0)
MCH: 30.2 pg (ref 26.0–34.0)
MCHC: 31.1 g/dL (ref 30.0–36.0)
MCV: 97.1 fL (ref 80.0–100.0)
Platelets: 228 10*3/uL (ref 150–400)
RBC: 3.84 MIL/uL — ABNORMAL LOW (ref 3.87–5.11)
RDW: 12.9 % (ref 11.5–15.5)
WBC: 8.4 10*3/uL (ref 4.0–10.5)
nRBC: 0 % (ref 0.0–0.2)

## 2020-02-01 SURGERY — INSERTION, TISSUE EXPANDER
Anesthesia: General | Site: Breast | Laterality: Bilateral

## 2020-02-01 MED ORDER — LIDOCAINE 2% (20 MG/ML) 5 ML SYRINGE
INTRAMUSCULAR | Status: AC
  Filled 2020-02-01: qty 5

## 2020-02-01 MED ORDER — HYDROMORPHONE HCL 1 MG/ML IJ SOLN: 0.2500 mg | INTRAMUSCULAR | Status: DC | PRN

## 2020-02-01 MED ORDER — ROCURONIUM BROMIDE 10 MG/ML (PF) SYRINGE
PREFILLED_SYRINGE | INTRAVENOUS | Status: AC
  Filled 2020-02-01: qty 10

## 2020-02-01 MED ORDER — MIDAZOLAM HCL 2 MG/2ML IJ SOLN: 0.5000 mg | Freq: Once | INTRAMUSCULAR | Status: DC | PRN

## 2020-02-01 MED ORDER — EPHEDRINE SULFATE-NACL 50-0.9 MG/10ML-% IV SOSY
PREFILLED_SYRINGE | INTRAVENOUS | Status: DC | PRN
  Administered 2020-02-01: 15 mg via INTRAVENOUS
  Administered 2020-02-01: 20 mg via INTRAVENOUS
  Administered 2020-02-01: 10 mg via INTRAVENOUS
  Administered 2020-02-01: 5 mg via INTRAVENOUS

## 2020-02-01 MED ORDER — FENTANYL CITRATE (PF) 250 MCG/5ML IJ SOLN
INTRAMUSCULAR | Status: DC | PRN
  Administered 2020-02-01: 25 ug via INTRAVENOUS
  Administered 2020-02-01: 50 ug via INTRAVENOUS

## 2020-02-01 MED ORDER — 0.9 % SODIUM CHLORIDE (POUR BTL) OPTIME
TOPICAL | Status: DC | PRN
  Administered 2020-02-01: 1000 mL

## 2020-02-01 MED ORDER — FENTANYL CITRATE (PF) 250 MCG/5ML IJ SOLN
INTRAMUSCULAR | Status: AC
  Filled 2020-02-01: qty 5

## 2020-02-01 MED ORDER — PHENYLEPHRINE 40 MCG/ML (10ML) SYRINGE FOR IV PUSH (FOR BLOOD PRESSURE SUPPORT)
PREFILLED_SYRINGE | INTRAVENOUS | Status: AC
  Filled 2020-02-01: qty 10

## 2020-02-01 MED ORDER — ACETAMINOPHEN 10 MG/ML IV SOLN
INTRAVENOUS | Status: DC | PRN
  Administered 2020-02-01: 1000 mg via INTRAVENOUS

## 2020-02-01 MED ORDER — SUCCINYLCHOLINE CHLORIDE 200 MG/10ML IV SOSY
PREFILLED_SYRINGE | INTRAVENOUS | Status: AC
  Filled 2020-02-01: qty 10

## 2020-02-01 MED ORDER — ACETAMINOPHEN 500 MG PO TABS: 1000.0000 mg | ORAL_TABLET | Freq: Once | ORAL | Status: DC

## 2020-02-01 MED ORDER — BUPIVACAINE HCL (PF) 0.25 % IJ SOLN
INTRAMUSCULAR | Status: DC | PRN
  Administered 2020-02-01: 10 mL

## 2020-02-01 MED ORDER — PROMETHAZINE HCL 25 MG/ML IJ SOLN: 6.2500 mg | INTRAMUSCULAR | Status: DC | PRN

## 2020-02-01 MED ORDER — DEXAMETHASONE SODIUM PHOSPHATE 10 MG/ML IJ SOLN
INTRAMUSCULAR | Status: DC | PRN
  Administered 2020-02-01: 10 mg via INTRAVENOUS

## 2020-02-01 MED ORDER — LIDOCAINE 2% (20 MG/ML) 5 ML SYRINGE
INTRAMUSCULAR | Status: DC | PRN
  Administered 2020-02-01: 50 mg via INTRAVENOUS

## 2020-02-01 MED ORDER — PHENYLEPHRINE 40 MCG/ML (10ML) SYRINGE FOR IV PUSH (FOR BLOOD PRESSURE SUPPORT)
PREFILLED_SYRINGE | INTRAVENOUS | Status: DC | PRN
  Administered 2020-02-01: 40 ug via INTRAVENOUS
  Administered 2020-02-01: 80 ug via INTRAVENOUS
  Administered 2020-02-01: 200 ug via INTRAVENOUS
  Administered 2020-02-01 (×2): 80 ug via INTRAVENOUS
  Administered 2020-02-01: 120 ug via INTRAVENOUS
  Administered 2020-02-01: 80 ug via INTRAVENOUS

## 2020-02-01 MED ORDER — ONDANSETRON HCL 4 MG/2ML IJ SOLN
INTRAMUSCULAR | Status: AC
  Filled 2020-02-01: qty 2

## 2020-02-01 MED ORDER — MIDAZOLAM HCL 2 MG/2ML IJ SOLN
INTRAMUSCULAR | Status: AC
  Filled 2020-02-01: qty 2

## 2020-02-01 MED ORDER — PROPOFOL 10 MG/ML IV BOLUS
INTRAVENOUS | Status: DC | PRN
  Administered 2020-02-01: 200 mg via INTRAVENOUS

## 2020-02-01 MED ORDER — LACTATED RINGERS IV SOLN: INTRAVENOUS | Status: DC

## 2020-02-01 MED ORDER — OXYCODONE HCL 5 MG/5ML PO SOLN: 5.0000 mg | Freq: Once | ORAL | Status: DC | PRN

## 2020-02-01 MED ORDER — CEFAZOLIN SODIUM-DEXTROSE 2-4 GM/100ML-% IV SOLN
2.0000 g | INTRAVENOUS | Status: AC
  Administered 2020-02-01: 2 g via INTRAVENOUS
  Filled 2020-02-01: qty 100

## 2020-02-01 MED ORDER — DEXAMETHASONE SODIUM PHOSPHATE 10 MG/ML IJ SOLN
INTRAMUSCULAR | Status: AC
  Filled 2020-02-01: qty 1

## 2020-02-01 MED ORDER — OXYCODONE HCL 5 MG PO TABS: 5.0000 mg | ORAL_TABLET | Freq: Once | ORAL | Status: DC | PRN

## 2020-02-01 MED ORDER — ONDANSETRON HCL 4 MG/2ML IJ SOLN
INTRAMUSCULAR | Status: DC | PRN
  Administered 2020-02-01: 4 mg via INTRAVENOUS

## 2020-02-01 MED ORDER — HYDROCODONE-ACETAMINOPHEN 5-325 MG PO TABS: 1.0000 | ORAL_TABLET | Freq: Three times a day (TID) | ORAL | 0 refills | Status: AC | PRN

## 2020-02-01 MED ORDER — BUPIVACAINE HCL (PF) 0.25 % IJ SOLN
INTRAMUSCULAR | Status: AC
  Filled 2020-02-01: qty 30

## 2020-02-01 MED ORDER — SODIUM CHLORIDE 0.9 % IV SOLN
Freq: Once | INTRAVENOUS | Status: AC
  Filled 2020-02-01 (×3): qty 10

## 2020-02-01 MED ORDER — LIDOCAINE-EPINEPHRINE (PF) 1 %-1:200000 IJ SOLN
INTRAMUSCULAR | Status: DC | PRN
  Administered 2020-02-01: 10 mL

## 2020-02-01 MED ORDER — PROPOFOL 10 MG/ML IV BOLUS
INTRAVENOUS | Status: AC
  Filled 2020-02-01: qty 20

## 2020-02-01 MED ORDER — LIDOCAINE-EPINEPHRINE (PF) 1 %-1:200000 IJ SOLN
INTRAMUSCULAR | Status: AC
  Filled 2020-02-01: qty 30

## 2020-02-01 MED ORDER — CHLORHEXIDINE GLUCONATE 0.12 % MT SOLN
15.0000 mL | Freq: Once | OROMUCOSAL | Status: AC
  Administered 2020-02-01: 15 mL via OROMUCOSAL
  Filled 2020-02-01: qty 15

## 2020-02-01 MED ORDER — EPHEDRINE 5 MG/ML INJ
INTRAVENOUS | Status: AC
  Filled 2020-02-01: qty 10

## 2020-02-01 MED ORDER — MEPERIDINE HCL 25 MG/ML IJ SOLN: 6.2500 mg | INTRAMUSCULAR | Status: DC | PRN

## 2020-02-01 MED ORDER — ORAL CARE MOUTH RINSE: 15.0000 mL | Freq: Once | OROMUCOSAL | Status: AC

## 2020-02-01 MED ORDER — ACETAMINOPHEN 10 MG/ML IV SOLN
INTRAVENOUS | Status: AC
  Filled 2020-02-01: qty 100

## 2020-02-01 SURGICAL SUPPLY — 47 items
APL PRP STRL LF DISP 70% ISPRP (MISCELLANEOUS) ×2
APL SKNCLS STERI-STRIP NONHPOA (GAUZE/BANDAGES/DRESSINGS) ×2
BAG DECANTER FOR FLEXI CONT (MISCELLANEOUS) ×2 IMPLANT
BENZOIN TINCTURE PRP APPL 2/3 (GAUZE/BANDAGES/DRESSINGS) ×4 IMPLANT
BIOPATCH RED 1 DISK 7.0 (GAUZE/BANDAGES/DRESSINGS) ×4 IMPLANT
BLADE SURG 15 STRL LF DISP TIS (BLADE) ×1 IMPLANT
BLADE SURG 15 STRL SS (BLADE) ×2
BNDG ELASTIC 4X5.8 VLCR STR LF (GAUZE/BANDAGES/DRESSINGS) ×4 IMPLANT
CANISTER SUCT 1200ML W/VALVE (MISCELLANEOUS) ×2 IMPLANT
CANISTER WOUND CARE 500ML ATS (WOUND CARE) IMPLANT
CHLORAPREP W/TINT 26 (MISCELLANEOUS) ×4 IMPLANT
COVER SURGICAL LIGHT HANDLE (MISCELLANEOUS) ×2 IMPLANT
COVER WAND RF STERILE (DRAPES) IMPLANT
DRAIN CHANNEL 15F RND FF W/TCR (WOUND CARE) ×4 IMPLANT
DRAPE INCISE IOBAN 66X45 STRL (DRAPES) ×2 IMPLANT
DRAPE LAPAROTOMY T 98X78 PEDS (DRAPES) ×2 IMPLANT
DRSG EMULSION OIL 3X3 NADH (GAUZE/BANDAGES/DRESSINGS) IMPLANT
DRSG PAD ABDOMINAL 8X10 ST (GAUZE/BANDAGES/DRESSINGS) ×4 IMPLANT
DRSG TEGADERM 2-3/8X2-3/4 SM (GAUZE/BANDAGES/DRESSINGS) ×4 IMPLANT
DRSG TELFA 3X8 NADH (GAUZE/BANDAGES/DRESSINGS) IMPLANT
DRSG VAC ATS LRG SENSATRAC (GAUZE/BANDAGES/DRESSINGS) IMPLANT
DRSG VAC ATS MED SENSATRAC (GAUZE/BANDAGES/DRESSINGS) IMPLANT
ELECT CAUTERY BLADE 6.4 (BLADE) ×2 IMPLANT
ELECT REM PT RETURN 9FT ADLT (ELECTROSURGICAL) ×2
ELECTRODE REM PT RTRN 9FT ADLT (ELECTROSURGICAL) ×1 IMPLANT
EVACUATOR SILICONE 100CC (DRAIN) ×4 IMPLANT
GAUZE SPONGE 4X4 12PLY STRL (GAUZE/BANDAGES/DRESSINGS) ×4 IMPLANT
GLOVE BIOGEL M STRL SZ7.5 (GLOVE) ×4 IMPLANT
GLOVE BIOGEL PI IND STRL 8 (GLOVE) ×1 IMPLANT
GLOVE BIOGEL PI INDICATOR 8 (GLOVE) ×1
GOWN STRL REUS W/ TWL LRG LVL3 (GOWN DISPOSABLE) ×2 IMPLANT
GOWN STRL REUS W/TWL LRG LVL3 (GOWN DISPOSABLE) ×4
KIT BASIN OR (CUSTOM PROCEDURE TRAY) ×2 IMPLANT
KIT TURNOVER KIT A (KITS) ×2 IMPLANT
NEEDLE HYPO 25GX1X1/2 BEV (NEEDLE) ×2 IMPLANT
NS IRRIG 1000ML POUR BTL (IV SOLUTION) ×2 IMPLANT
PACK GENERAL/GYN (CUSTOM PROCEDURE TRAY) ×2 IMPLANT
SOLUTION BETADINE 4OZ (MISCELLANEOUS) IMPLANT
STAPLER INSORB 30 2030 C-SECTI (MISCELLANEOUS) ×2 IMPLANT
STRIP CLOSURE SKIN 1/2X4 (GAUZE/BANDAGES/DRESSINGS) ×4 IMPLANT
SUT ETHILON 3 0 PS 1 (SUTURE) ×6 IMPLANT
SUT PDS AB 3-0 SH 27 (SUTURE) ×6 IMPLANT
SUT VLOC 90 P-14 23 (SUTURE) ×4 IMPLANT
SWAB COLLECTION DEVICE MRSA (MISCELLANEOUS) IMPLANT
SYR CONTROL 10ML LL (SYRINGE) ×2 IMPLANT
TOWEL GREEN STERILE (TOWEL DISPOSABLE) ×2 IMPLANT
TOWEL GREEN STERILE FF (TOWEL DISPOSABLE) ×2 IMPLANT

## 2020-02-01 NOTE — Transfer of Care (Signed)
Immediate Anesthesia Transfer of Care Note  Patient: Melissa Chase  Procedure(s) Performed: removal of bilateral expander with closure (Bilateral Breast)  Patient Location: PACU  Anesthesia Type:General  Level of Consciousness: drowsy, patient cooperative and responds to stimulation  Airway & Oxygen Therapy: Patient Spontanous Breathing and Patient connected to face mask oxygen  Post-op Assessment: Report given to RN and Patient moving all extremities X 4  Post vital signs: Reviewed and stable  Last Vitals:  Vitals Value Taken Time  BP 116/48 02/01/20 1117  Temp    Pulse 82 02/01/20 1121  Resp 20 02/01/20 1121  SpO2 97 % 02/01/20 1121  Vitals shown include unvalidated device data.  Last Pain:  Vitals:   02/01/20 0720  TempSrc:   PainSc: 0-No pain         Complications: No complications documented.

## 2020-02-01 NOTE — Brief Op Note (Signed)
02/01/2020  11:04 AM  PATIENT:  Helane Rima Wiederhold  80 y.o. female  PRE-OPERATIVE DIAGNOSIS:  open breast wound  POST-OPERATIVE DIAGNOSIS:  open breast wound  PROCEDURE:  Procedure(s): removal of bilateral expander with closure (Bilateral)  SURGEON:  Surgeon(s) and Role:    * Davian Hanshaw, Steffanie Dunn, MD - Primary  PHYSICIAN ASSISTANT: Software engineer, PA  ASSISTANTS: none   ANESTHESIA:   general  EBL:  20   BLOOD ADMINISTERED:none  DRAINS: (2) Jackson-Pratt drain(s) with closed bulb suction in the chest   LOCAL MEDICATIONS USED:  MARCAINE     SPECIMEN:  No Specimen  DISPOSITION OF SPECIMEN:  N/A  COUNTS:  YES  TOURNIQUET:  * No tourniquets in log *  DICTATION: .Dragon Dictation  PLAN OF CARE: Discharge to home after PACU  PATIENT DISPOSITION:  PACU - hemodynamically stable.   Delay start of Pharmacological VTE agent (>24hrs) due to surgical blood loss or risk of bleeding: not applicable

## 2020-02-01 NOTE — Anesthesia Postprocedure Evaluation (Signed)
Anesthesia Post Note  Patient: Naliyah Neth Kalafut  Procedure(s) Performed: removal of bilateral expander with closure (Bilateral Breast)     Patient location during evaluation: PACU Anesthesia Type: General Level of consciousness: awake and alert, patient cooperative and oriented Pain management: pain level controlled Vital Signs Assessment: post-procedure vital signs reviewed and stable Respiratory status: spontaneous breathing, nonlabored ventilation and respiratory function stable Cardiovascular status: blood pressure returned to baseline and stable Postop Assessment: no apparent nausea or vomiting Anesthetic complications: no   No complications documented.  Last Vitals:  Vitals:   02/01/20 1200 02/01/20 1215  BP: (!) 96/55 (!) 98/55  Pulse: 71 70  Resp: 16 17  Temp:  (!) 36.3 C  SpO2: 92% 90%    Last Pain:  Vitals:   02/01/20 1215  TempSrc:   PainSc: 0-No pain                 Barron Vanloan,E. Mirage Pfefferkorn

## 2020-02-01 NOTE — Op Note (Signed)
Operative Note   DATE OF OPERATION: 02/01/2020  SURGICAL DEPARTMENT: Plastic Surgery  PREOPERATIVE DIAGNOSES: Exposure of left breast tissue expander  POSTOPERATIVE DIAGNOSES:  same  PROCEDURE: 1.  Irrigation and debridement of bilateral chest wounds 2.  Removal of bilateral breast tissue expanders  SURGEON: Talmadge Coventry, MD  ASSISTANT: Verdie Shire, PA The advanced practice practitioner (APP) assisted throughout the case.  The APP was essential in retraction and counter traction when needed to make the case progress smoothly.  This retraction and assistance made it possible to see the tissue plans for the procedure.  The assistance was needed for blood control, tissue re-approximation and assisted with closure of the incision site.  ANESTHESIA:  General.   COMPLICATIONS: None.   INDICATIONS FOR PROCEDURE:  The patient, Melissa Chase is a 80 y.o. female born on [birth year], is here for treatment of exposed left breast tissue expander.  She had prepectoral reconstruction in the setting of prior radiation after bilateral mastectomies.  She initially had significant mastectomy flap necrosis on both sides.  Salvage attempt was made to close over the expanders after removing the original ones along with removing the acellular dermal matrix.  She is developed a wound over the left tissue expander and requested both tissue expanders be removed. MRN: [id number]  CONSENT:  Informed consent was obtained directly from the patient. Risks, benefits and alternatives were fully discussed. Specific risks including but not limited to bleeding, infection, hematoma, seroma, scarring, pain, contracture, asymmetry, wound healing problems, and need for further surgery were all discussed. The patient did have an ample opportunity to have questions answered to satisfaction.   DESCRIPTION OF PROCEDURE:  The patient was taken to the operating room. SCDs were placed and antibiotics were given.  General  anesthesia was administered.  The patient's operative site was prepped and draped in a sterile fashion. A time out was performed and all information was confirmed to be correct.  Local anesthetic was infiltrated along the incisions bilaterally.  I started on the left side and incised around the opening which was at the medial aspect of the incision.  The incision was extended laterally approximately 9 to 10 cm.  The expander was removed without issue.  There was a chronic rind within the pocket and this was debrided with a curette on both the underside of the mastectomy flap and down on the chest wall.  This left mostly healthy tissue behind.  I then irrigated copiously with antibiotic irrigation and achieved meticulous hemostasis.  [place] was placed and secured with a nylon suture.  Then turned my attention to the right side.  Elliptical incision was made along the medial aspect of the wound which is where the tissue was the thinnest.  It was extended laterally 9 to 10 cm.  I dissected down to the expander and it was removed without issue.  Similar to the left side there was chronic rind on the inner surface of the pocket that was debrided with a curette back to healthy tissue.  Copious antibiotic irrigation was performed followed by meticulous hemostasis.  32 French drain was placed and secured with a nylon suture.  Closure was done on both sides with interrupted buried 3-0 PDS sutures and a running 3 OV lock.  Steri-Strips and a compressive dressing were applied.  On both sides it was a sharp excisional debridement performed with a knife and curette totaling approximately 10 x 10 cm on the left and 10 x 10 cm on the right.  The patient tolerated the procedure well.  There were no complications. The patient was allowed to wake from anesthesia, extubated and taken to the recovery room in satisfactory condition.

## 2020-02-01 NOTE — Interval H&P Note (Signed)
Patient seen and examined. Risks and benefits discussed. Proceed with surgery.

## 2020-02-01 NOTE — Discharge Instructions (Addendum)
Activity As tolerated: NO showers until 3 days after surgery. Keep ACE wrap on 24/7 unless showering or changing dressings. This is important to prevent additional swelling/control pain.  NO driving while in pain, taking pain medication or if you are unable to safely react to traffic. No heavy activities. No lifting > 15 pounds.  Take Pain medication (Norco) as needed for severe pain. Otherwise, you can use ibuprofen or tylenol as needed for mild/moderate pain.  Diet: Regular. Drink plenty of fluids (water, avoid juice/soda) and eat healthy, high protein, low carbs.  Wound Care: Keep dressing clean & dry. You may change bandages after showering if you continue to notice some drainage.  Special Instructions: Call Doctor if any unusual problems occur such as pain, excessive Bleeding, unrelieved Nausea/vomiting, Fever &/or chills  Drains: Measure drain output from drains every 24 hours. Record this on a log for [place] to view during post-op appointments. Drainage will change colors over the next week to two weeks. This is normal. Color of drainage can vary from red-pink-orange-yellow.  Follow-up appointment: Scheduled for next week.

## 2020-02-02 ENCOUNTER — Encounter (HOSPITAL_COMMUNITY): Payer: Self-pay | Admitting: Plastic Surgery

## 2020-02-03 ENCOUNTER — Encounter: Payer: Self-pay | Admitting: Plastic Surgery

## 2020-02-03 ENCOUNTER — Other Ambulatory Visit: Payer: Self-pay

## 2020-02-03 ENCOUNTER — Ambulatory Visit (INDEPENDENT_AMBULATORY_CARE_PROVIDER_SITE_OTHER): Payer: Medicare Other | Admitting: Plastic Surgery

## 2020-02-03 VITALS — BP 124/78 | HR 82 | Temp 97.5°F

## 2020-02-03 DIAGNOSIS — Z9013 Acquired absence of bilateral breasts and nipples: Secondary | ICD-10-CM

## 2020-02-03 NOTE — Progress Notes (Signed)
Patient is here to 3 days postop from removal of bilateral tissue expanders and closure.  She feels like she is doing pretty well and would like her drains to be removed.  The drains are putting out about 10 cc a day for the past 2 days.  On exam all of her skin looks healthy and the incisions look to be intact thus far.  I do not detect any subcutaneous fluid.  Given the chronicity of the cavity I like to leave the drains in for little bit longer but we can likely remove them next week in the office if the output stays low.  I had like to her to continue to wear soft compressive garment and we will see her again next week.

## 2020-02-09 ENCOUNTER — Telehealth: Payer: Self-pay | Admitting: Surgical

## 2020-02-09 NOTE — Telephone Encounter (Signed)
Patient's daughter called the office stating the patient was having some changes in her mental status, difficulty drinking, eating and fevers over the past few days, fevers have improved over the past day.  She has had 2 at home COVID test which she reports were negative.  Daughter reports that the patient's breast incisions and breasts appear to be doing well, no worsening erythema.  Of note, she does have a history of radiation which has contributed to chronic redness of her bilateral breast.  She still has her drains in place.  Patient was seen by Dr. Claudia Desanctis last week and was doing well.  I discussed with the patient's daughter that she needs to be evaluated in the emergency room. I recommended they call the ambulance for her as the daughter reports that she is unable to personally assist her mother into the car.  Patient's daughter states that she would like to avoid going to the emergency room due to the risk of contracting COVID.  I explained to her that however contracting COVID is a risk at the emergency room, she does need to be evaluated due to her symptoms.  Patient's daughter is agreeable.

## 2020-02-10 ENCOUNTER — Telehealth: Payer: Self-pay

## 2020-02-10 ENCOUNTER — Ambulatory Visit: Payer: Medicare Other | Admitting: Surgical

## 2020-02-10 DIAGNOSIS — Z7901 Long term (current) use of anticoagulants: Secondary | ICD-10-CM | POA: Diagnosis not present

## 2020-02-10 DIAGNOSIS — I10 Essential (primary) hypertension: Secondary | ICD-10-CM | POA: Diagnosis present

## 2020-02-10 DIAGNOSIS — D696 Thrombocytopenia, unspecified: Secondary | ICD-10-CM | POA: Diagnosis not present

## 2020-02-10 DIAGNOSIS — R748 Abnormal levels of other serum enzymes: Secondary | ICD-10-CM | POA: Diagnosis present

## 2020-02-10 DIAGNOSIS — Z66 Do not resuscitate: Secondary | ICD-10-CM | POA: Diagnosis not present

## 2020-02-10 DIAGNOSIS — I480 Paroxysmal atrial fibrillation: Secondary | ICD-10-CM | POA: Diagnosis not present

## 2020-02-10 DIAGNOSIS — Z9013 Acquired absence of bilateral breasts and nipples: Secondary | ICD-10-CM | POA: Diagnosis not present

## 2020-02-10 DIAGNOSIS — J9811 Atelectasis: Secondary | ICD-10-CM | POA: Diagnosis not present

## 2020-02-10 DIAGNOSIS — I420 Dilated cardiomyopathy: Secondary | ICD-10-CM | POA: Diagnosis present

## 2020-02-10 DIAGNOSIS — Z20822 Contact with and (suspected) exposure to covid-19: Secondary | ICD-10-CM | POA: Diagnosis present

## 2020-02-10 DIAGNOSIS — N200 Calculus of kidney: Secondary | ICD-10-CM | POA: Diagnosis not present

## 2020-02-10 DIAGNOSIS — E871 Hypo-osmolality and hyponatremia: Secondary | ICD-10-CM | POA: Diagnosis present

## 2020-02-10 DIAGNOSIS — N178 Other acute kidney failure: Secondary | ICD-10-CM | POA: Diagnosis not present

## 2020-02-10 DIAGNOSIS — E860 Dehydration: Secondary | ICD-10-CM | POA: Diagnosis present

## 2020-02-10 DIAGNOSIS — R791 Abnormal coagulation profile: Secondary | ICD-10-CM | POA: Diagnosis present

## 2020-02-10 DIAGNOSIS — D649 Anemia, unspecified: Secondary | ICD-10-CM | POA: Diagnosis not present

## 2020-02-10 DIAGNOSIS — W19XXXA Unspecified fall, initial encounter: Secondary | ICD-10-CM | POA: Diagnosis not present

## 2020-02-10 DIAGNOSIS — E162 Hypoglycemia, unspecified: Secondary | ICD-10-CM | POA: Diagnosis present

## 2020-02-10 DIAGNOSIS — F32A Depression, unspecified: Secondary | ICD-10-CM | POA: Diagnosis present

## 2020-02-10 DIAGNOSIS — R509 Fever, unspecified: Secondary | ICD-10-CM | POA: Diagnosis not present

## 2020-02-10 DIAGNOSIS — E43 Unspecified severe protein-calorie malnutrition: Secondary | ICD-10-CM | POA: Diagnosis present

## 2020-02-10 DIAGNOSIS — R0682 Tachypnea, not elsewhere classified: Secondary | ICD-10-CM | POA: Diagnosis present

## 2020-02-10 DIAGNOSIS — T8143XA Infection following a procedure, organ and space surgical site, initial encounter: Secondary | ICD-10-CM | POA: Diagnosis present

## 2020-02-10 DIAGNOSIS — R7401 Elevation of levels of liver transaminase levels: Secondary | ICD-10-CM | POA: Diagnosis present

## 2020-02-10 DIAGNOSIS — R6520 Severe sepsis without septic shock: Secondary | ICD-10-CM | POA: Diagnosis not present

## 2020-02-10 DIAGNOSIS — K769 Liver disease, unspecified: Secondary | ICD-10-CM | POA: Diagnosis not present

## 2020-02-10 DIAGNOSIS — D509 Iron deficiency anemia, unspecified: Secondary | ICD-10-CM | POA: Diagnosis present

## 2020-02-10 DIAGNOSIS — R630 Anorexia: Secondary | ICD-10-CM | POA: Diagnosis not present

## 2020-02-10 DIAGNOSIS — B9689 Other specified bacterial agents as the cause of diseases classified elsewhere: Secondary | ICD-10-CM | POA: Diagnosis not present

## 2020-02-10 DIAGNOSIS — R5381 Other malaise: Secondary | ICD-10-CM | POA: Diagnosis present

## 2020-02-10 DIAGNOSIS — R5082 Postprocedural fever: Secondary | ICD-10-CM | POA: Diagnosis not present

## 2020-02-10 DIAGNOSIS — A419 Sepsis, unspecified organism: Secondary | ICD-10-CM | POA: Diagnosis not present

## 2020-02-10 DIAGNOSIS — R918 Other nonspecific abnormal finding of lung field: Secondary | ICD-10-CM | POA: Diagnosis not present

## 2020-02-10 DIAGNOSIS — R4182 Altered mental status, unspecified: Secondary | ICD-10-CM | POA: Diagnosis not present

## 2020-02-10 DIAGNOSIS — N390 Urinary tract infection, site not specified: Secondary | ICD-10-CM | POA: Diagnosis present

## 2020-02-10 DIAGNOSIS — I4891 Unspecified atrial fibrillation: Secondary | ICD-10-CM | POA: Diagnosis present

## 2020-02-10 DIAGNOSIS — I340 Nonrheumatic mitral (valve) insufficiency: Secondary | ICD-10-CM | POA: Diagnosis present

## 2020-02-10 DIAGNOSIS — A4159 Other Gram-negative sepsis: Secondary | ICD-10-CM | POA: Diagnosis present

## 2020-02-10 DIAGNOSIS — R627 Adult failure to thrive: Secondary | ICD-10-CM | POA: Diagnosis present

## 2020-02-10 DIAGNOSIS — R531 Weakness: Secondary | ICD-10-CM | POA: Diagnosis not present

## 2020-02-10 DIAGNOSIS — R945 Abnormal results of liver function studies: Secondary | ICD-10-CM | POA: Diagnosis not present

## 2020-02-10 DIAGNOSIS — R6521 Severe sepsis with septic shock: Secondary | ICD-10-CM | POA: Diagnosis not present

## 2020-02-10 DIAGNOSIS — R7989 Other specified abnormal findings of blood chemistry: Secondary | ICD-10-CM | POA: Diagnosis not present

## 2020-02-10 DIAGNOSIS — N179 Acute kidney failure, unspecified: Secondary | ICD-10-CM | POA: Diagnosis present

## 2020-02-10 DIAGNOSIS — R112 Nausea with vomiting, unspecified: Secondary | ICD-10-CM | POA: Diagnosis not present

## 2020-02-10 DIAGNOSIS — I272 Pulmonary hypertension, unspecified: Secondary | ICD-10-CM | POA: Diagnosis present

## 2020-02-10 NOTE — Telephone Encounter (Signed)
I spoke with the patient's daughter Melissa Chase yesterday evening and recommended the patient seek medical attention in the emergency room immediately. During our discussion I reiterated to the patient's daughter how important it was for her to be evaluated in the emergency room.  Patient's daughter seemed agreeable upon our conversation ending.  I advised the daughter yesterday evening to please call us to let us know their plans.  Appreciate the update, I hope she is doing well and we are available for any assistance as needed.

## 2020-02-10 NOTE — Telephone Encounter (Signed)
Patient's granddaughter called to let us know that she and her mother, Melissa Chase (Patient's daughter) went to pick up the patient for her appointment with [place] today and she couldn't stand up.  Lovena Le said that the patient was short of breath when she went to the restroom.  Lovena Le took her blood pressure and it was 59/45.  When the [facility] arrived, they took her blood pressure and it was 63/38.  Her heart rate was in the 90's and she seemed confused.  EMS gave her a choice of going to [facility] or [facility], so she went to [place].  Lovena Le said that her mom, [name], is with the patient so we may not be able to reach her if we try to call.  Lovena Le said we can call her if we have any questions.

## 2020-02-11 ENCOUNTER — Other Ambulatory Visit: Payer: Self-pay | Admitting: Family Medicine

## 2020-02-11 DIAGNOSIS — M8580 Other specified disorders of bone density and structure, unspecified site: Secondary | ICD-10-CM

## 2020-02-18 ENCOUNTER — Telehealth: Payer: Self-pay

## 2020-02-18 DIAGNOSIS — E43 Unspecified severe protein-calorie malnutrition: Secondary | ICD-10-CM | POA: Diagnosis not present

## 2020-02-18 DIAGNOSIS — M4696 Unspecified inflammatory spondylopathy, lumbar region: Secondary | ICD-10-CM | POA: Diagnosis not present

## 2020-02-18 DIAGNOSIS — I10 Essential (primary) hypertension: Secondary | ICD-10-CM | POA: Diagnosis not present

## 2020-02-18 DIAGNOSIS — I480 Paroxysmal atrial fibrillation: Secondary | ICD-10-CM | POA: Diagnosis not present

## 2020-02-18 DIAGNOSIS — Z85828 Personal history of other malignant neoplasm of skin: Secondary | ICD-10-CM | POA: Diagnosis not present

## 2020-02-18 DIAGNOSIS — E669 Obesity, unspecified: Secondary | ICD-10-CM | POA: Diagnosis not present

## 2020-02-18 DIAGNOSIS — Z7901 Long term (current) use of anticoagulants: Secondary | ICD-10-CM | POA: Diagnosis not present

## 2020-02-18 DIAGNOSIS — R634 Abnormal weight loss: Secondary | ICD-10-CM | POA: Diagnosis not present

## 2020-02-18 DIAGNOSIS — Z853 Personal history of malignant neoplasm of breast: Secondary | ICD-10-CM | POA: Diagnosis not present

## 2020-02-18 DIAGNOSIS — Z9013 Acquired absence of bilateral breasts and nipples: Secondary | ICD-10-CM | POA: Diagnosis not present

## 2020-02-18 DIAGNOSIS — Z9181 History of falling: Secondary | ICD-10-CM | POA: Diagnosis not present

## 2020-02-18 DIAGNOSIS — M7989 Other specified soft tissue disorders: Secondary | ICD-10-CM | POA: Diagnosis not present

## 2020-02-18 DIAGNOSIS — D509 Iron deficiency anemia, unspecified: Secondary | ICD-10-CM | POA: Diagnosis not present

## 2020-02-18 DIAGNOSIS — T8189XD Other complications of procedures, not elsewhere classified, subsequent encounter: Secondary | ICD-10-CM | POA: Diagnosis not present

## 2020-02-18 DIAGNOSIS — Z4801 Encounter for change or removal of surgical wound dressing: Secondary | ICD-10-CM | POA: Diagnosis not present

## 2020-02-18 DIAGNOSIS — N179 Acute kidney failure, unspecified: Secondary | ICD-10-CM | POA: Diagnosis not present

## 2020-02-18 DIAGNOSIS — I420 Dilated cardiomyopathy: Secondary | ICD-10-CM | POA: Diagnosis not present

## 2020-02-18 DIAGNOSIS — Z6832 Body mass index (BMI) 32.0-32.9, adult: Secondary | ICD-10-CM | POA: Diagnosis not present

## 2020-02-18 NOTE — Telephone Encounter (Signed)
Copied from [place] [id number]. Topic: General - Other >> Feb 18, 2020  3:30 PM Leward Quan A wrote: Reason for CRM: Levada Dy with [facility] called in to inform Dr Ronnald Ramp that Patient was admitted to [facility] care today  and need an Rx faxed over [facility] [employer] Ph# [phone]  Fax# [phone]

## 2020-02-21 ENCOUNTER — Ambulatory Visit (INDEPENDENT_AMBULATORY_CARE_PROVIDER_SITE_OTHER): Payer: Medicare Other | Admitting: Plastic Surgery

## 2020-02-21 ENCOUNTER — Encounter: Payer: Self-pay | Admitting: Plastic Surgery

## 2020-02-21 ENCOUNTER — Inpatient Hospital Stay: Payer: Medicare Other | Admitting: Family Medicine

## 2020-02-21 ENCOUNTER — Other Ambulatory Visit: Payer: Self-pay

## 2020-02-21 VITALS — HR 81

## 2020-02-21 DIAGNOSIS — S21002A Unspecified open wound of left breast, initial encounter: Secondary | ICD-10-CM

## 2020-02-21 DIAGNOSIS — E43 Unspecified severe protein-calorie malnutrition: Secondary | ICD-10-CM | POA: Diagnosis not present

## 2020-02-21 DIAGNOSIS — I480 Paroxysmal atrial fibrillation: Secondary | ICD-10-CM | POA: Diagnosis not present

## 2020-02-21 DIAGNOSIS — T8189XD Other complications of procedures, not elsewhere classified, subsequent encounter: Secondary | ICD-10-CM | POA: Diagnosis not present

## 2020-02-21 DIAGNOSIS — I420 Dilated cardiomyopathy: Secondary | ICD-10-CM | POA: Diagnosis not present

## 2020-02-21 DIAGNOSIS — M7989 Other specified soft tissue disorders: Secondary | ICD-10-CM | POA: Diagnosis not present

## 2020-02-21 DIAGNOSIS — N179 Acute kidney failure, unspecified: Secondary | ICD-10-CM | POA: Diagnosis not present

## 2020-02-21 NOTE — Telephone Encounter (Signed)
Was done Friday

## 2020-02-21 NOTE — Progress Notes (Signed)
Patient presents about 3 weeks postop from removal of bilateral breast tissue expanders and closure of chest wounds.  She unfortunately had a sepsis and bacteremia that is believed to be from UTI but overall the sources not totally clear.  She did have A. fib and hypotension that led to some renal problems that are now resolving.  There was no infectious source related to her chest that was identified.  She has improved from the standpoint of her systemic symptoms.  Over the last several days she has noticed an opening along the incision on her left chest.  On examination the right side looks to be healing fine with very little output out of the JP.  The left side has dehisced with a wound that is approximately 8 cm x 1 cm in size.  There is a little bit of joint drainage coming from the left side but no erythema or fluctuance.  The JP on that side is no longer holding suction because the wound is open.  Both drains were removed today.  We will plan to do wound care on the left chest with an ABD pad to catch the drainage for the next week or so.  We will plan on seeing her again in 2 weeks.  I explained the challenges of the situation given the radiated field I expect will be difficult to heal this wound.  Patient spontaneously asked about returning to the operating room and I plan to get this at least a few months time to try to heal on its own prior to considering procedural intervention.  She is fine with this.  She is here with her daughter and granddaughter who are doing a great job taking care of her.

## 2020-02-22 DIAGNOSIS — M7989 Other specified soft tissue disorders: Secondary | ICD-10-CM | POA: Diagnosis not present

## 2020-02-22 DIAGNOSIS — E43 Unspecified severe protein-calorie malnutrition: Secondary | ICD-10-CM | POA: Diagnosis not present

## 2020-02-22 DIAGNOSIS — I420 Dilated cardiomyopathy: Secondary | ICD-10-CM | POA: Diagnosis not present

## 2020-02-22 DIAGNOSIS — I480 Paroxysmal atrial fibrillation: Secondary | ICD-10-CM | POA: Diagnosis not present

## 2020-02-22 DIAGNOSIS — N179 Acute kidney failure, unspecified: Secondary | ICD-10-CM | POA: Diagnosis not present

## 2020-02-22 DIAGNOSIS — T8189XD Other complications of procedures, not elsewhere classified, subsequent encounter: Secondary | ICD-10-CM | POA: Diagnosis not present

## 2020-02-23 DIAGNOSIS — I480 Paroxysmal atrial fibrillation: Secondary | ICD-10-CM | POA: Diagnosis not present

## 2020-02-23 DIAGNOSIS — E43 Unspecified severe protein-calorie malnutrition: Secondary | ICD-10-CM | POA: Diagnosis not present

## 2020-02-23 DIAGNOSIS — N179 Acute kidney failure, unspecified: Secondary | ICD-10-CM | POA: Diagnosis not present

## 2020-02-23 DIAGNOSIS — I420 Dilated cardiomyopathy: Secondary | ICD-10-CM | POA: Diagnosis not present

## 2020-02-23 DIAGNOSIS — M7989 Other specified soft tissue disorders: Secondary | ICD-10-CM | POA: Diagnosis not present

## 2020-02-23 DIAGNOSIS — T8189XD Other complications of procedures, not elsewhere classified, subsequent encounter: Secondary | ICD-10-CM | POA: Diagnosis not present

## 2020-02-24 ENCOUNTER — Telehealth: Payer: Self-pay

## 2020-02-24 NOTE — Telephone Encounter (Signed)
Corresponded via fax. This is a new wound from removal of bilateral breast expanders and closure of chest wound on 02/01/20. Incision had become open on left chest measuring 8cm x 1cm and wound has become dehisced

## 2020-02-24 NOTE — Telephone Encounter (Signed)
[name] called from [facility] regarding Melissa Chase.  She needs to verify the wound, is this a new or existing wound.  Please call her back at [phone].

## 2020-02-25 DIAGNOSIS — M7989 Other specified soft tissue disorders: Secondary | ICD-10-CM | POA: Diagnosis not present

## 2020-02-25 DIAGNOSIS — I712 Thoracic aortic aneurysm, without rupture: Secondary | ICD-10-CM | POA: Diagnosis not present

## 2020-02-25 DIAGNOSIS — I340 Nonrheumatic mitral (valve) insufficiency: Secondary | ICD-10-CM | POA: Diagnosis not present

## 2020-02-25 DIAGNOSIS — N179 Acute kidney failure, unspecified: Secondary | ICD-10-CM | POA: Diagnosis not present

## 2020-02-25 DIAGNOSIS — Z6841 Body Mass Index (BMI) 40.0 and over, adult: Secondary | ICD-10-CM | POA: Diagnosis not present

## 2020-02-25 DIAGNOSIS — R000 Tachycardia, unspecified: Secondary | ICD-10-CM | POA: Diagnosis not present

## 2020-02-25 DIAGNOSIS — T8189XD Other complications of procedures, not elsewhere classified, subsequent encounter: Secondary | ICD-10-CM | POA: Diagnosis not present

## 2020-02-25 DIAGNOSIS — I071 Rheumatic tricuspid insufficiency: Secondary | ICD-10-CM | POA: Diagnosis not present

## 2020-02-25 DIAGNOSIS — I480 Paroxysmal atrial fibrillation: Secondary | ICD-10-CM | POA: Diagnosis not present

## 2020-02-25 DIAGNOSIS — I10 Essential (primary) hypertension: Secondary | ICD-10-CM | POA: Diagnosis not present

## 2020-02-25 DIAGNOSIS — E43 Unspecified severe protein-calorie malnutrition: Secondary | ICD-10-CM | POA: Diagnosis not present

## 2020-02-25 DIAGNOSIS — I420 Dilated cardiomyopathy: Secondary | ICD-10-CM | POA: Diagnosis not present

## 2020-02-25 NOTE — Telephone Encounter (Signed)
Called [facility], spoke with Verline Lema to confirm they received my fax and response in regards if patient has a new wound. Yes, they did and supplies went out yesterday.

## 2020-02-28 DIAGNOSIS — N179 Acute kidney failure, unspecified: Secondary | ICD-10-CM | POA: Diagnosis not present

## 2020-02-28 DIAGNOSIS — M7989 Other specified soft tissue disorders: Secondary | ICD-10-CM | POA: Diagnosis not present

## 2020-02-28 DIAGNOSIS — E43 Unspecified severe protein-calorie malnutrition: Secondary | ICD-10-CM | POA: Diagnosis not present

## 2020-02-28 DIAGNOSIS — I480 Paroxysmal atrial fibrillation: Secondary | ICD-10-CM | POA: Diagnosis not present

## 2020-02-28 DIAGNOSIS — T8189XD Other complications of procedures, not elsewhere classified, subsequent encounter: Secondary | ICD-10-CM | POA: Diagnosis not present

## 2020-02-28 DIAGNOSIS — I420 Dilated cardiomyopathy: Secondary | ICD-10-CM | POA: Diagnosis not present

## 2020-02-29 ENCOUNTER — Telehealth: Payer: Self-pay

## 2020-02-29 NOTE — Telephone Encounter (Addendum)
Amada Jupiter called from [facility] to say the patient has a left breast wound that is still open.  Adonis Huguenin said that the left breast has clear sutures in it and three blue sutures are still visible.  Adonis Huguenin said that the right breast has healed but the patient has three black sutures still present.  She would like to verify that the sutures are still supposed to be there.  Adonis Huguenin would like to know if the sutures are dissolvable.  If they are not dissolvable, how long can they stay in?  The patient's next appointment with [place] is 03/09/2020.

## 2020-02-29 NOTE — Telephone Encounter (Signed)
Refaxed signed order to [facility] as they did not see the original signature on application.

## 2020-03-01 DIAGNOSIS — I420 Dilated cardiomyopathy: Secondary | ICD-10-CM | POA: Diagnosis not present

## 2020-03-01 DIAGNOSIS — T8189XD Other complications of procedures, not elsewhere classified, subsequent encounter: Secondary | ICD-10-CM | POA: Diagnosis not present

## 2020-03-01 DIAGNOSIS — N179 Acute kidney failure, unspecified: Secondary | ICD-10-CM | POA: Diagnosis not present

## 2020-03-01 DIAGNOSIS — M7989 Other specified soft tissue disorders: Secondary | ICD-10-CM | POA: Diagnosis not present

## 2020-03-01 DIAGNOSIS — E43 Unspecified severe protein-calorie malnutrition: Secondary | ICD-10-CM | POA: Diagnosis not present

## 2020-03-01 DIAGNOSIS — I480 Paroxysmal atrial fibrillation: Secondary | ICD-10-CM | POA: Diagnosis not present

## 2020-03-01 NOTE — Telephone Encounter (Signed)
Returned Vivian's call from [facility]. LMVM, under the advice from Dr. Claudia Desanctis, continue using santyl, ABD pads and tape every day for the left breast. He is aware of the undermining at 1- 2'clock and taking showers. All sutures are dissolvable. Patient is to follow up at her next appointment on 03/09/20

## 2020-03-03 ENCOUNTER — Ambulatory Visit (INDEPENDENT_AMBULATORY_CARE_PROVIDER_SITE_OTHER): Payer: Medicare Other | Admitting: Family Medicine

## 2020-03-03 ENCOUNTER — Other Ambulatory Visit: Payer: Self-pay

## 2020-03-03 ENCOUNTER — Telehealth: Payer: Self-pay

## 2020-03-03 ENCOUNTER — Encounter: Payer: Self-pay | Admitting: Family Medicine

## 2020-03-03 ENCOUNTER — Telehealth: Payer: Self-pay | Admitting: *Deleted

## 2020-03-03 VITALS — BP 112/80 | HR 60 | Ht 65.0 in | Wt 195.0 lb

## 2020-03-03 DIAGNOSIS — E43 Unspecified severe protein-calorie malnutrition: Secondary | ICD-10-CM | POA: Diagnosis not present

## 2020-03-03 DIAGNOSIS — T8189XD Other complications of procedures, not elsewhere classified, subsequent encounter: Secondary | ICD-10-CM | POA: Diagnosis not present

## 2020-03-03 DIAGNOSIS — R609 Edema, unspecified: Secondary | ICD-10-CM | POA: Diagnosis not present

## 2020-03-03 DIAGNOSIS — D649 Anemia, unspecified: Secondary | ICD-10-CM | POA: Diagnosis not present

## 2020-03-03 DIAGNOSIS — N179 Acute kidney failure, unspecified: Secondary | ICD-10-CM | POA: Diagnosis not present

## 2020-03-03 DIAGNOSIS — Z09 Encounter for follow-up examination after completed treatment for conditions other than malignant neoplasm: Secondary | ICD-10-CM | POA: Diagnosis not present

## 2020-03-03 DIAGNOSIS — M7989 Other specified soft tissue disorders: Secondary | ICD-10-CM | POA: Diagnosis not present

## 2020-03-03 DIAGNOSIS — R7989 Other specified abnormal findings of blood chemistry: Secondary | ICD-10-CM

## 2020-03-03 DIAGNOSIS — I420 Dilated cardiomyopathy: Secondary | ICD-10-CM | POA: Diagnosis not present

## 2020-03-03 DIAGNOSIS — I480 Paroxysmal atrial fibrillation: Secondary | ICD-10-CM | POA: Diagnosis not present

## 2020-03-03 NOTE — Telephone Encounter (Signed)
Copied from [place] [id number]. Topic: General - Other >> Mar 03, 2020 10:11 AM Keene Breath wrote: Reason for CRM: Santiago Glad, with [facility], call to inform the office that they received a fax for patient that they believe should have gone to [facility].  Please call to discuss at [phone]

## 2020-03-03 NOTE — Telephone Encounter (Signed)
Called and refaxed papers to [facility]

## 2020-03-03 NOTE — Progress Notes (Signed)
Date:  03/03/2020   Name:  Melissa Chase   DOB:  April 05, 1940   MRN:  [id number]   Chief Complaint: Follow-up (? Lisinopril, HCTZ and fosamax)  Patient is a 80 year old female who presents for a hospital discharge  exam. The patient reports the following problems: pedal edema. Health maintenance has been reviewed up to date.   Lab Results  Component Value Date   CREATININE 1.07 (H) 02/01/2020   BUN 20 02/01/2020   NA 138 02/01/2020   K 3.7 02/01/2020   CL 100 02/01/2020   CO2 28 02/01/2020   Lab Results  Component Value Date   CHOL 193 05/26/2019   HDL 84 05/26/2019   LDLCALC 90 05/26/2019   TRIG 94 05/26/2019   CHOLHDL 2.3 05/26/2019   Lab Results  Component Value Date   TSH 2.210 09/03/2018   Lab Results  Component Value Date   HGBA1C 5.4 01/01/2013   Lab Results  Component Value Date   WBC 8.4 02/01/2020   HGB 11.6 (L) 02/01/2020   HCT 37.3 02/01/2020   MCV 97.1 02/01/2020   PLT 228 02/01/2020   Lab Results  Component Value Date   ALT 16 09/23/2019   AST 26 09/23/2019   ALKPHOS 29 (L) 09/23/2019   BILITOT 0.6 09/23/2019     Review of Systems  Constitutional: Negative.  Negative for chills, fatigue, fever and unexpected weight change.  HENT: Negative for congestion, ear discharge, ear pain, rhinorrhea, sinus pressure, sneezing and sore throat.   Eyes: Negative for double vision, photophobia, pain, discharge, redness and itching.  Respiratory: Negative for cough, shortness of breath, wheezing and stridor.   Cardiovascular: Positive for leg swelling.  Gastrointestinal: Negative for abdominal pain, blood in stool, constipation, diarrhea, nausea and vomiting.  Endocrine: Negative for cold intolerance, heat intolerance, polydipsia, polyphagia and polyuria.  Genitourinary: Negative for dysuria, flank pain, frequency, hematuria, menstrual problem, pelvic pain, urgency, vaginal bleeding and vaginal discharge.  Musculoskeletal: Negative for arthralgias, back  pain and myalgias.  Skin: Negative for rash.  Allergic/Immunologic: Negative for environmental allergies and food allergies.  Neurological: Negative for dizziness, weakness, light-headedness, numbness and headaches.  Hematological: Negative for adenopathy. Does not bruise/bleed easily.  Psychiatric/Behavioral: Negative for dysphoric mood. The patient is not nervous/anxious.     Patient Active Problem List   Diagnosis Date Noted  . S/P mastectomy, bilateral 11/15/2019  . Breast cancer ([place]) 11/01/2019  . Obesity (BMI 30-39.9) 10/26/2018  . 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 Goals of care, counseling/discussion 09/18/2017  . Sacroiliitis ([place]) 02/04/2017  . Dilated cardiomyopathy ([place]) 05/02/2016  . Bradycardia 04/04/2016  . A-fib ([place]) 03/20/2016  . Acute dyspnea 02/20/2016  . 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 Arthritis 06/06/2014  . Aggrieved 06/06/2014  . Dermatitis, eczematoid 06/06/2014  . Recurrent major depressive episodes ([place]) 06/06/2014  . Essential (primary) hypertension 06/06/2014  . Gastro-esophageal reflux disease without esophagitis 06/06/2014  . Cannot sleep 06/06/2014  . Menopause 06/06/2014    No Known Allergies  Past Surgical History:  Procedure Laterality Date  . ABDOMINAL HYSTERECTOMY    . APPLICATION OF WOUND VAC  11/01/2019   Procedure: APPLICATION OF WOUND VAC;  Surgeon: Herbert Pun, MD;  Location: [facility] ORS;  Service: General;;  . 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 BREAST BIOPSY Left 10/12/2019   affirm bx of calcs, x  marker, path pending  . 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 BREAST LUMPECTOMY Right 07/09/2017   DCIS, clear margins  . BREAST LUMPECTOMY Left 2006   DCIS  . BREAST LUMPECTOMY WITH NEEDLE LOCALIZATION Right 07/09/2017   Procedure: BREAST LUMPECTOMY  WITH NEEDLE LOCALIZATION;  Surgeon: Herbert Pun, MD;  Location: [facility] ORS;  Service: General;  Laterality: Right;  . BREAST RECONSTRUCTION WITH PLACEMENT OF TISSUE EXPANDER AND FLEX HD (ACELLULAR HYDRATED DERMIS) Bilateral 11/01/2019   Procedure: BREAST RECONSTRUCTION WITH PLACEMENT OF TISSUE EXPANDER AND FLEX HD (ACELLULAR HYDRATED DERMIS);  Surgeon: Cindra Presume, MD;  Location: [facility] ORS;  Service: Plastics;  Laterality: Bilateral;  . BREAST SURGERY     left breast lumpectomy  . CARDIOVERSION N/A 03/21/2016   Procedure: Cardioversion;  Surgeon: Corey Skains, MD;  Location: [facility] OR[facility];  Service: Cardiovascular;  Laterality: N/A;  . CATARACT EXTRACTION, BILATERAL    . DEBRIDEMENT AND CLOSURE WOUND Bilateral 11/26/2019   Procedure: Excision of bilateral mastectomy flap necrosis with complex closure;  Surgeon: Cindra Presume, MD;  Location: [place];  Service: Plastics;  Laterality: Bilateral;  1.5 hours total  . LAPAROSCOPIC CHOLECYSTECTOMY    . TISSUE EXPANDER PLACEMENT Bilateral 11/26/2019   Procedure: exchange of tissue expanders;  Surgeon: Cindra Presume, MD;  Location: [place];  Service: Plastics;  Laterality: Bilateral;  . TISSUE EXPANDER PLACEMENT Bilateral 02/01/2020   Procedure: removal of bilateral expander with closure;  Surgeon: Cindra Presume, MD;  Location: [place];  Service: Plastics;  Laterality: Bilateral;  . TONSILLECTOMY    . TOTAL MASTECTOMY Bilateral 11/01/2019   Procedure: TOTAL MASTECTOMY w/ immediate reconstruction and Sentinel Node Biopsy;  Surgeon: Herbert Pun, MD;  Location: [facility];  Service: General;  Laterality: Bilateral;  . VAGINAL HYSTERECTOMY      Social History   Tobacco Use  . Smoking status: Never Smoker  . Smokeless tobacco: Never Used  Vaping Use  . Vaping Use: Never used  Substance Use Topics  . Alcohol use: Never  . Drug use: No     Medication list has been reviewed and updated.  Current Meds  Medication Sig  . apixaban  (ELIQUIS) 5 MG TABS tablet Take 5 mg by mouth 2 (two) times daily.   . calcium-vitamin D (OSCAL WITH D) 500-200 MG-UNIT TABS tablet Take 1 tablet by mouth 2 (two) times daily.   . metoprolol tartrate (LOPRESSOR) 50 MG tablet Take 1 tablet by mouth in the morning and at bedtime. kowalski  . pravastatin (PRAVACHOL) 20 MG tablet Take 1 tablet (20 mg total) by mouth daily. (Patient taking differently: Take 20 mg by mouth every evening.)  . sertraline (ZOLOFT) 100 MG tablet Take 1 tablet (100 mg total) by mouth daily.    PHQ 2/9 Scores 03/03/2020 12/13/2019 10/27/2019 08/19/2019  PHQ - 2 Score 0 0 0 0  PHQ- 9 Score 7 0 0 0    GAD 7 : Generalized Anxiety Score 03/03/2020 10/27/2019 08/19/2019 05/25/2019  Nervous, Anxious, on Edge 1 0 0 0  Control/stop worrying 2 0 0 0  Worry too much - different things 1 0 0 0  Trouble relaxing 0 0 0 0  Restless 0 0 0 0  Easily annoyed or irritable 2 0 0 0  Afraid - awful might happen 1 0 0 0  Total GAD 7 Score 7 0 0 0  Anxiety Difficulty Not difficult at all - - -    BP Readings from Last 3 Encounters:  03/03/20 112/80  02/03/20 124/78  02/01/20 (!) 98/55    Physical Exam Vitals and nursing note reviewed.  Constitutional:      Appearance: She is well-developed and well-nourished.  HENT:     Head: Normocephalic.     Right Ear: Tympanic membrane, ear canal and external ear normal. There is no impacted cerumen.     Left Ear: Tympanic membrane, ear canal and external ear normal. There is no impacted cerumen.     Nose: Nose normal. No congestion or rhinorrhea.     Mouth/Throat:     Mouth: Oropharynx is clear and moist. Mucous membranes are dry.  Eyes:     General: Lids are everted, no foreign bodies appreciated. No scleral icterus.       Left eye: No foreign body or hordeolum.     Extraocular Movements: EOM normal.     Conjunctiva/sclera: Conjunctivae normal.     Right eye: Right conjunctiva is not injected.     Left eye: Left conjunctiva is not  injected.     Pupils: Pupils are equal, round, and reactive to light.  Neck:     Thyroid: No thyromegaly.     Vascular: No carotid bruit or JVD.     Trachea: No tracheal deviation.  Cardiovascular:     Rate and Rhythm: Normal rate and regular rhythm.     Pulses: Intact distal pulses.     Heart sounds: Normal heart sounds. No murmur heard. No friction rub. No gallop.   Pulmonary:     Effort: Pulmonary effort is normal. No respiratory distress.     Breath sounds: Normal breath sounds. No stridor. No wheezing, rhonchi or rales.  Chest:     Chest wall: No tenderness.  Abdominal:     General: Bowel sounds are normal.     Palpations: Abdomen is soft. There is no hepatosplenomegaly or mass.     Tenderness: There is no abdominal tenderness. There is no guarding or rebound.  Musculoskeletal:        General: No tenderness or edema. Normal range of motion.     Cervical back: Normal range of motion and neck supple.  Lymphadenopathy:     Cervical: No cervical adenopathy.  Skin:    General: Skin is warm.     Findings: No bruising, erythema or rash.  Neurological:     Mental Status: She is alert and oriented to person, place, and time.     Cranial Nerves: No cranial nerve deficit.     Deep Tendon Reflexes: Strength normal. Reflexes normal.  Psychiatric:        Mood and Affect: Mood and affect normal. Mood is not anxious or depressed.     Wt Readings from Last 3 Encounters:  03/03/20 195 lb (88.5 kg)  02/01/20 199 lb 15.3 oz (90.7 kg)  11/26/19 222 lb (100.7 kg)    BP 112/80   Pulse 60   Ht 5\' 5"  (1.651 m)   Wt 195 lb (88.5 kg)   BMI 32.45 kg/m   Assessment and Plan: 1. Hospital discharge follow-up Patient is follow-up from hospital.  This appointment is outside the 2-week window.  This is in general reviewing medications and repeating labs encounter.  2. Anemia, unspecified type New onset.  Persistent.  Currently being treated with ferrous sulfate.  Will check CBC to see if  this is improving. - CBC with Differential/Platelet  3. Prerenal azotemia New onset.  Noted to be dehydrated with some improvement.  Patient still has decreased mucous  membrane moisture and weight prior to admission is still not been obtained.  Will check renal function panel for GFR and electrolytes. - Renal Function Panel  4. Dependent edema Patient has edema of the lower feet but this is not volume overload because she is sleeping in a recliner and I rather doubt that she is able to get her feet in a same plane as her heart.  Patient has been instructed to if she can return to bed with improved otherwise she is going to need to go back in a more reclining position.

## 2020-03-03 NOTE — Telephone Encounter (Signed)
Received standard written orders on (03/02/20) via of fax from [facility].  Requesting a signature from the physician.  Orders given to physician to sign.    Orders signed and faxed back to [facility].  Confirmation received and copy scanned into the chart.//AB/CMA

## 2020-03-04 LAB — RENAL FUNCTION PANEL
Albumin: 3.6 g/dL — ABNORMAL LOW (ref 3.7–4.7)
BUN/Creatinine Ratio: 10 — ABNORMAL LOW (ref 12–28)
BUN: 9 mg/dL (ref 8–27)
CO2: 24 mmol/L (ref 20–29)
Calcium: 9.4 mg/dL (ref 8.7–10.3)
Chloride: 106 mmol/L (ref 96–106)
Creatinine, Ser: 0.87 mg/dL (ref 0.57–1.00)
GFR calc Af Amer: 74 mL/min/{1.73_m2} (ref >59)
GFR calc non Af Amer: 64 mL/min/{1.73_m2} (ref >59)
Glucose: 126 mg/dL — ABNORMAL HIGH (ref 65–99)
Phosphorus: 3 mg/dL (ref 3.0–4.3)
Potassium: 4 mmol/L (ref 3.5–5.2)
Sodium: 147 mmol/L — ABNORMAL HIGH (ref 134–144)

## 2020-03-04 LAB — CBC WITH DIFFERENTIAL/PLATELET
Basophils Absolute: 0.1 10*3/uL (ref 0.0–0.2)
Basos: 1 %
EOS (ABSOLUTE): 0.2 10*3/uL (ref 0.0–0.4)
Eos: 3 %
Hematocrit: 36 % (ref 34.0–46.6)
Hemoglobin: 11.2 g/dL (ref 11.1–15.9)
Immature Grans (Abs): 0 10*3/uL (ref 0.0–0.1)
Immature Granulocytes: 0 %
Lymphocytes Absolute: 1.8 10*3/uL (ref 0.7–3.1)
Lymphs: 28 %
MCH: 29.4 pg (ref 26.6–33.0)
MCHC: 31.1 g/dL — ABNORMAL LOW (ref 31.5–35.7)
MCV: 95 fL (ref 79–97)
Monocytes Absolute: 0.8 10*3/uL (ref 0.1–0.9)
Monocytes: 13 %
Neutrophils Absolute: 3.6 10*3/uL (ref 1.4–7.0)
Neutrophils: 55 %
Platelets: 177 10*3/uL (ref 150–450)
RBC: 3.81 x10E6/uL (ref 3.77–5.28)
RDW: 13.1 % (ref 11.7–15.4)
WBC: 6.4 10*3/uL (ref 3.4–10.8)

## 2020-03-06 ENCOUNTER — Telehealth: Payer: Self-pay | Admitting: Plastic Surgery

## 2020-03-06 DIAGNOSIS — I420 Dilated cardiomyopathy: Secondary | ICD-10-CM | POA: Diagnosis not present

## 2020-03-06 DIAGNOSIS — M7989 Other specified soft tissue disorders: Secondary | ICD-10-CM | POA: Diagnosis not present

## 2020-03-06 DIAGNOSIS — I480 Paroxysmal atrial fibrillation: Secondary | ICD-10-CM | POA: Diagnosis not present

## 2020-03-06 DIAGNOSIS — T8189XD Other complications of procedures, not elsewhere classified, subsequent encounter: Secondary | ICD-10-CM | POA: Diagnosis not present

## 2020-03-06 DIAGNOSIS — N179 Acute kidney failure, unspecified: Secondary | ICD-10-CM | POA: Diagnosis not present

## 2020-03-06 DIAGNOSIS — E43 Unspecified severe protein-calorie malnutrition: Secondary | ICD-10-CM | POA: Diagnosis not present

## 2020-03-06 MED ORDER — SULFAMETHOXAZOLE-TRIMETHOPRIM 800-160 MG PO TABS: 1.0000 | ORAL_TABLET | Freq: Two times a day (BID) | ORAL | 0 refills | Status: AC

## 2020-03-06 NOTE — Telephone Encounter (Signed)
Patient's daughter, [name], called to say that they have been packing the wound but it is starting to have a foul odor and Home health recommended calling us to get an antibiotic. Patient uses [facility] in [place]. Please call daughter back to [phone] There is an appointment scheduled for Friday.

## 2020-03-06 NOTE — Telephone Encounter (Signed)
Patient's daughter called the office today to report that she has noticed a foul odor from her mother's breast wound.  She was advised by home health call our office.  She reports that she has noticed a foul odor starting yesterday.  She reports there is no pus draining from the wound.  She reports the drainage appears normal in color to her.  She is otherwise doing well.  They are scheduled to see [place] for evaluation in a few days.  We will send prophylactic antibiotic for coverage until evaluation of Friday.  All of her questions were answered.  Recommend she call with any further questions.

## 2020-03-08 ENCOUNTER — Encounter: Payer: Self-pay | Admitting: Family Medicine

## 2020-03-08 ENCOUNTER — Telehealth: Payer: Self-pay | Admitting: Family Medicine

## 2020-03-08 NOTE — Telephone Encounter (Signed)
Called and spoke to daughter- coming in the morning to be seen

## 2020-03-08 NOTE — Telephone Encounter (Signed)
Daughter lesa calling to report pt is down in her back and she cannot get her up very well.  She thinks she turned around and hurt or snapped her back.  Now she cannot get comfortable. She is driving her daughter "nuts".   She yells when she moves.  Pt can get up, but it is slow going. [name] is concerned the pt may fall, and she will not be able to get her up if that happens. [name] would like Baxter Flattery to give her a call asap.

## 2020-03-09 ENCOUNTER — Other Ambulatory Visit: Payer: Self-pay

## 2020-03-09 ENCOUNTER — Encounter: Payer: Self-pay | Admitting: Family Medicine

## 2020-03-09 ENCOUNTER — Ambulatory Visit (INDEPENDENT_AMBULATORY_CARE_PROVIDER_SITE_OTHER): Payer: Medicare Other | Admitting: Family Medicine

## 2020-03-09 ENCOUNTER — Ambulatory Visit: Payer: Medicare Other | Admitting: Surgical

## 2020-03-09 VITALS — BP 110/80 | HR 64 | Ht 65.0 in | Wt 195.0 lb

## 2020-03-09 DIAGNOSIS — M47816 Spondylosis without myelopathy or radiculopathy, lumbar region: Secondary | ICD-10-CM | POA: Diagnosis not present

## 2020-03-09 DIAGNOSIS — M5136 Other intervertebral disc degeneration, lumbar region: Secondary | ICD-10-CM

## 2020-03-09 DIAGNOSIS — M545 Low back pain, unspecified: Secondary | ICD-10-CM

## 2020-03-09 LAB — POCT URINALYSIS DIPSTICK
Bilirubin, UA: POSITIVE
Blood, UA: NEGATIVE
Glucose, UA: NEGATIVE
Ketones, UA: POSITIVE
Leukocytes, UA: NEGATIVE
Nitrite, UA: NEGATIVE
Protein, UA: NEGATIVE
Spec Grav, UA: 1.02 (ref 1.010–1.025)
Urobilinogen, UA: 0.2 E.U./dL
pH, UA: 5 (ref 5.0–8.0)

## 2020-03-09 MED ORDER — TRAMADOL HCL 50 MG PO TABS: 50.0000 mg | ORAL_TABLET | Freq: Three times a day (TID) | ORAL | 0 refills | Status: AC | PRN

## 2020-03-09 MED ORDER — CELECOXIB 100 MG PO CAPS: 100.0000 mg | ORAL_CAPSULE | Freq: Every day | ORAL | 1 refills | Status: DC

## 2020-03-09 MED ORDER — CYCLOBENZAPRINE HCL 5 MG PO TABS: 5.0000 mg | ORAL_TABLET | Freq: Two times a day (BID) | ORAL | 1 refills | Status: DC | PRN

## 2020-03-09 NOTE — Progress Notes (Signed)
Date:  03/09/2020   Name:  Melissa Chase   DOB:  [birth year]   MRN:  [id number]   Chief Complaint: Back Pain  Back Pain This is a chronic problem. The current episode started 1 to 4 weeks ago (2 weeks). The problem occurs constantly. The problem has been gradually improving since onset. The pain is present in the lumbar spine. The quality of the pain is described as aching. Radiates to: sciatica. The pain is at a severity of 8/10. The pain is severe. The pain is the same all the time. The symptoms are aggravated by bending and sitting. Associated symptoms include paresis. Pertinent negatives include no abdominal pain, bladder incontinence, bowel incontinence, dysuria, fever, headaches, numbness, paresthesias, pelvic pain, tingling or weakness. She has tried heat (oxycodone) for the symptoms. The treatment provided mild relief.    Lab Results  Component Value Date   CREATININE 0.87 03/03/2020   BUN 9 03/03/2020   NA 147 (H) 03/03/2020   K 4.0 03/03/2020   CL 106 03/03/2020   CO2 24 03/03/2020   Lab Results  Component Value Date   CHOL 193 05/26/2019   HDL 84 05/26/2019   LDLCALC 90 05/26/2019   TRIG 94 05/26/2019   CHOLHDL 2.3 05/26/2019   Lab Results  Component Value Date   TSH 2.210 09/03/2018   Lab Results  Component Value Date   HGBA1C 5.4 01/01/2013   Lab Results  Component Value Date   WBC 6.4 03/03/2020   HGB 11.2 03/03/2020   HCT 36.0 03/03/2020   MCV 95 03/03/2020   PLT 177 03/03/2020   Lab Results  Component Value Date   ALT 16 09/23/2019   AST 26 09/23/2019   ALKPHOS 29 (L) 09/23/2019   BILITOT 0.6 09/23/2019     Review of Systems  Constitutional: Negative.  Negative for chills, fatigue, fever and unexpected weight change.  HENT: Negative for congestion, ear discharge, ear pain, rhinorrhea, sinus pressure, sneezing and sore throat.   Eyes: Negative for double vision, photophobia, pain, discharge, redness and itching.  Respiratory: Negative for  cough, shortness of breath, wheezing and stridor.   Gastrointestinal: Negative for abdominal pain, blood in stool, bowel incontinence, constipation, diarrhea, nausea and vomiting.  Endocrine: Negative for cold intolerance, heat intolerance, polydipsia, polyphagia and polyuria.  Genitourinary: Negative for bladder incontinence, dysuria, flank pain, frequency, hematuria, menstrual problem, pelvic pain, urgency, vaginal bleeding and vaginal discharge.  Musculoskeletal: Positive for back pain and myalgias. Negative for arthralgias.  Skin: Negative for rash.  Allergic/Immunologic: Negative for environmental allergies and food allergies.  Neurological: Negative for dizziness, tingling, weakness, light-headedness, numbness, headaches and paresthesias.  Hematological: Negative for adenopathy. Does not bruise/bleed easily.  Psychiatric/Behavioral: Negative for dysphoric mood. The patient is not nervous/anxious.     Patient Active Problem List   Diagnosis Date Noted  . S/P mastectomy, bilateral 11/15/2019  . Breast cancer ([place]) 11/01/2019  . Obesity (BMI 30-39.9) 10/26/2018  . 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 Goals of care, counseling/discussion 09/18/2017  . Sacroiliitis ([place]) 02/04/2017  . Dilated cardiomyopathy ([place]) 05/02/2016  . Bradycardia 04/04/2016  . A-fib ([place]) 03/20/2016  . Acute dyspnea 02/20/2016  . 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 Arthritis 06/06/2014  . Aggrieved 06/06/2014  . Dermatitis, eczematoid 06/06/2014  . Recurrent major depressive episodes ([place]) 06/06/2014  . Essential (primary) hypertension 06/06/2014  . Gastro-esophageal reflux disease without esophagitis 06/06/2014  . Cannot sleep 06/06/2014  .  Menopause 06/06/2014    No Known Allergies  Past Surgical History:  Procedure Laterality Date  . ABDOMINAL HYSTERECTOMY    . APPLICATION OF WOUND VAC  11/01/2019    Procedure: APPLICATION OF WOUND VAC;  Surgeon: Herbert Pun, MD;  Location: [facility];  Service: General;;  . 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 BREAST BIOPSY Left 10/12/2019   affirm bx of calcs, x marker, path pending  . 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 BREAST LUMPECTOMY Right 07/09/2017   DCIS, clear margins  . BREAST LUMPECTOMY Left 2006   DCIS  . BREAST LUMPECTOMY WITH NEEDLE LOCALIZATION Right 07/09/2017   Procedure: BREAST LUMPECTOMY WITH NEEDLE LOCALIZATION;  Surgeon: Herbert Pun, MD;  Location: [facility];  Service: General;  Laterality: Right;  . BREAST RECONSTRUCTION WITH PLACEMENT OF TISSUE EXPANDER AND FLEX HD (ACELLULAR HYDRATED DERMIS) Bilateral 11/01/2019   Procedure: BREAST RECONSTRUCTION WITH PLACEMENT OF TISSUE EXPANDER AND FLEX HD (ACELLULAR HYDRATED DERMIS);  Surgeon: Cindra Presume, MD;  Location: [facility];  Service: Plastics;  Laterality: Bilateral;  . BREAST SURGERY     left breast lumpectomy  . CARDIOVERSION N/A 03/21/2016   Procedure: Cardioversion;  Surgeon: Corey Skains, MD;  Location: [facility];  Service: Cardiovascular;  Laterality: N/A;  . CATARACT EXTRACTION, BILATERAL    . DEBRIDEMENT AND CLOSURE WOUND Bilateral 11/26/2019   Procedure: Excision of bilateral mastectomy flap necrosis with complex closure;  Surgeon: Cindra Presume, MD;  Location: [place];  Service: Plastics;  Laterality: Bilateral;  1.5 hours total  . LAPAROSCOPIC CHOLECYSTECTOMY    . TISSUE EXPANDER PLACEMENT Bilateral 11/26/2019   Procedure: exchange of tissue expanders;  Surgeon: Cindra Presume, MD;  Location: [place];  Service: Plastics;  Laterality: Bilateral;  . TISSUE EXPANDER PLACEMENT Bilateral 02/01/2020   Procedure: removal of bilateral expander with closure;  Surgeon: Cindra Presume, MD;  Location: [place];  Service: Plastics;  Laterality: Bilateral;  . TONSILLECTOMY    . TOTAL  MASTECTOMY Bilateral 11/01/2019   Procedure: TOTAL MASTECTOMY w/ immediate reconstruction and Sentinel Node Biopsy;  Surgeon: Herbert Pun, MD;  Location: [facility];  Service: General;  Laterality: Bilateral;  . VAGINAL HYSTERECTOMY      Social History   Tobacco Use  . Smoking status: Never Smoker  . Smokeless tobacco: Never Used  Vaping Use  . Vaping Use: Never used  Substance Use Topics  . Alcohol use: Never  . Drug use: No     Medication list has been reviewed and updated.  No outpatient medications have been marked as taking for the 03/09/20 encounter (Office Visit) with Juline Patch, MD.    [facility] 2/9 Scores 03/03/2020 12/13/2019 10/27/2019 08/19/2019  PHQ - 2 Score 0 0 0 0  PHQ- 9 Score 7 0 0 0    GAD 7 : Generalized Anxiety Score 03/03/2020 10/27/2019 08/19/2019 05/25/2019  Nervous, Anxious, on Edge 1 0 0 0  Control/stop worrying 2 0 0 0  Worry too much - different things 1 0 0 0  Trouble relaxing 0 0 0 0  Restless 0 0 0 0  Easily annoyed or irritable 2 0 0 0  Afraid - awful might happen 1 0 0 0  Total GAD 7 Score 7 0 0 0  Anxiety Difficulty Not difficult at all - - -    BP Readings from Last 3 Encounters:  03/09/20 110/80  03/03/20 112/80  02/03/20 124/78    Physical Exam Vitals and nursing note reviewed.  Constitutional:      Appearance: She is well-developed and well-nourished.  HENT:     Head: Normocephalic.     Right Ear: Tympanic membrane, ear canal and external ear normal. There is no impacted cerumen.     Left Ear: Tympanic membrane, ear canal and external ear normal. There is no impacted cerumen.     Nose: Nose normal. No congestion or rhinorrhea.     Mouth/Throat:     Mouth: Oropharynx is clear and moist. Mucous membranes are moist.  Eyes:     General: Lids are everted, no foreign bodies appreciated. No scleral icterus.       Left eye: No foreign body or hordeolum.     Extraocular Movements: EOM normal.     Conjunctiva/sclera: Conjunctivae  normal.     Right eye: Right conjunctiva is not injected.     Left eye: Left conjunctiva is not injected.     Pupils: Pupils are equal, round, and reactive to light.  Neck:     Thyroid: No thyromegaly.     Vascular: No JVD.     Trachea: No tracheal deviation.  Cardiovascular:     Rate and Rhythm: Normal rate and regular rhythm.     Pulses: Intact distal pulses.     Heart sounds: Normal heart sounds. No murmur heard. No friction rub. No gallop.   Pulmonary:     Effort: Pulmonary effort is normal. No respiratory distress.     Breath sounds: Normal breath sounds. No wheezing, rhonchi or rales.  Abdominal:     General: Bowel sounds are normal.     Palpations: Abdomen is soft. There is no hepatosplenomegaly or mass.     Tenderness: There is no abdominal tenderness. There is no guarding or rebound.  Musculoskeletal:        General: No tenderness or edema. Normal range of motion.     Cervical back: Normal range of motion and neck supple.  Lymphadenopathy:     Cervical: No cervical adenopathy.  Skin:    General: Skin is warm.     Findings: No rash.  Neurological:     Mental Status: She is alert and oriented to person, place, and time.     Cranial Nerves: No cranial nerve deficit.     Deep Tendon Reflexes: Strength normal. Reflexes normal.  Psychiatric:        Mood and Affect: Mood and affect normal. Mood is not anxious or depressed.     Wt Readings from Last 3 Encounters:  03/09/20 195 lb (88.5 kg)  03/03/20 195 lb (88.5 kg)  02/01/20 199 lb 15.3 oz (90.7 kg)    BP 110/80   Pulse 64   Ht 5\' 5"  (1.651 m)   Wt 195 lb (88.5 kg)   BMI 32.45 kg/m   Assessment and Plan: 1. Degenerative lumbar disc New onset.  Recurrent.  Persistent at this time.  Patient similar to a year and a half ago has lumbar pain that radiates to the left buttock.  This is consistent with lumbar disc disease and sciatica.  We will begin with regimen of tramadol 50 mg every 8 hours, Celebrex 100 mg once a  day and cyclobenzaprine 5 mg 2 times a day for muscle spasms as needed. - traMADol (ULTRAM) 50 MG tablet; Take 1 tablet (50 mg total) by mouth every 8 (eight) hours as needed for up to 5 days.  Dispense: 15 tablet;  Refill: 0 - celecoxib (CELEBREX) 100 MG capsule; Take 1 capsule (100 mg total) by mouth daily.  Dispense: 14 capsule; Refill: 1 - cyclobenzaprine (FLEXERIL) 5 MG tablet; Take 1 tablet (5 mg total) by mouth 2 (two) times daily as needed for muscle spasms.  Dispense: 30 tablet; Refill: 1  2. Facet arthritis of lumbar region As noted above patient has also facet arthritis lumbar area for which we will use this regimen as well - traMADol (ULTRAM) 50 MG tablet; Take 1 tablet (50 mg total) by mouth every 8 (eight) hours as needed for up to 5 days.  Dispense: 15 tablet; Refill: 0 - celecoxib (CELEBREX) 100 MG capsule; Take 1 capsule (100 mg total) by mouth daily.  Dispense: 14 capsule; Refill: 1 - cyclobenzaprine (FLEXERIL) 5 MG tablet; Take 1 tablet (5 mg total) by mouth 2 (two) times daily as needed for muscle spasms.  Dispense: 30 tablet; Refill: 1  3. Acute low back pain without sciatica, unspecified back pain laterality Initially will approach with urinalysis which noted to be no infection and most likely is signaling that this is entirely musculoskeletal/neurologic. - POCT urinalysis dipstick

## 2020-03-10 DIAGNOSIS — I480 Paroxysmal atrial fibrillation: Secondary | ICD-10-CM | POA: Diagnosis not present

## 2020-03-10 DIAGNOSIS — N179 Acute kidney failure, unspecified: Secondary | ICD-10-CM | POA: Diagnosis not present

## 2020-03-10 DIAGNOSIS — I420 Dilated cardiomyopathy: Secondary | ICD-10-CM | POA: Diagnosis not present

## 2020-03-10 DIAGNOSIS — T8189XD Other complications of procedures, not elsewhere classified, subsequent encounter: Secondary | ICD-10-CM | POA: Diagnosis not present

## 2020-03-10 DIAGNOSIS — E43 Unspecified severe protein-calorie malnutrition: Secondary | ICD-10-CM | POA: Diagnosis not present

## 2020-03-10 DIAGNOSIS — M7989 Other specified soft tissue disorders: Secondary | ICD-10-CM | POA: Diagnosis not present

## 2020-03-13 DIAGNOSIS — T8189XD Other complications of procedures, not elsewhere classified, subsequent encounter: Secondary | ICD-10-CM | POA: Diagnosis not present

## 2020-03-13 DIAGNOSIS — M7989 Other specified soft tissue disorders: Secondary | ICD-10-CM | POA: Diagnosis not present

## 2020-03-13 DIAGNOSIS — N179 Acute kidney failure, unspecified: Secondary | ICD-10-CM | POA: Diagnosis not present

## 2020-03-13 DIAGNOSIS — E43 Unspecified severe protein-calorie malnutrition: Secondary | ICD-10-CM | POA: Diagnosis not present

## 2020-03-13 DIAGNOSIS — I480 Paroxysmal atrial fibrillation: Secondary | ICD-10-CM | POA: Diagnosis not present

## 2020-03-13 DIAGNOSIS — I420 Dilated cardiomyopathy: Secondary | ICD-10-CM | POA: Diagnosis not present

## 2020-03-14 ENCOUNTER — Other Ambulatory Visit: Payer: Self-pay

## 2020-03-14 DIAGNOSIS — M545 Low back pain, unspecified: Secondary | ICD-10-CM

## 2020-03-14 MED ORDER — TIZANIDINE HCL 4 MG PO TABS: 4.0000 mg | ORAL_TABLET | Freq: Every day | ORAL | 1 refills | Status: DC

## 2020-03-14 NOTE — Progress Notes (Unsigned)
Sent in [place]. In place of cyclobenzaprine due to ins.

## 2020-03-16 ENCOUNTER — Ambulatory Visit: Payer: Medicare Other | Admitting: Family Medicine

## 2020-03-17 DIAGNOSIS — E43 Unspecified severe protein-calorie malnutrition: Secondary | ICD-10-CM | POA: Diagnosis not present

## 2020-03-17 DIAGNOSIS — N179 Acute kidney failure, unspecified: Secondary | ICD-10-CM | POA: Diagnosis not present

## 2020-03-17 DIAGNOSIS — T8189XD Other complications of procedures, not elsewhere classified, subsequent encounter: Secondary | ICD-10-CM | POA: Diagnosis not present

## 2020-03-17 DIAGNOSIS — M7989 Other specified soft tissue disorders: Secondary | ICD-10-CM | POA: Diagnosis not present

## 2020-03-17 DIAGNOSIS — I480 Paroxysmal atrial fibrillation: Secondary | ICD-10-CM | POA: Diagnosis not present

## 2020-03-17 DIAGNOSIS — I420 Dilated cardiomyopathy: Secondary | ICD-10-CM | POA: Diagnosis not present

## 2020-03-19 DIAGNOSIS — Z7901 Long term (current) use of anticoagulants: Secondary | ICD-10-CM | POA: Diagnosis not present

## 2020-03-19 DIAGNOSIS — M4696 Unspecified inflammatory spondylopathy, lumbar region: Secondary | ICD-10-CM | POA: Diagnosis not present

## 2020-03-19 DIAGNOSIS — E43 Unspecified severe protein-calorie malnutrition: Secondary | ICD-10-CM | POA: Diagnosis not present

## 2020-03-19 DIAGNOSIS — R634 Abnormal weight loss: Secondary | ICD-10-CM | POA: Diagnosis not present

## 2020-03-19 DIAGNOSIS — D509 Iron deficiency anemia, unspecified: Secondary | ICD-10-CM | POA: Diagnosis not present

## 2020-03-19 DIAGNOSIS — Z85828 Personal history of other malignant neoplasm of skin: Secondary | ICD-10-CM | POA: Diagnosis not present

## 2020-03-19 DIAGNOSIS — T8189XD Other complications of procedures, not elsewhere classified, subsequent encounter: Secondary | ICD-10-CM | POA: Diagnosis not present

## 2020-03-19 DIAGNOSIS — N179 Acute kidney failure, unspecified: Secondary | ICD-10-CM | POA: Diagnosis not present

## 2020-03-19 DIAGNOSIS — M7989 Other specified soft tissue disorders: Secondary | ICD-10-CM | POA: Diagnosis not present

## 2020-03-19 DIAGNOSIS — Z6832 Body mass index (BMI) 32.0-32.9, adult: Secondary | ICD-10-CM | POA: Diagnosis not present

## 2020-03-19 DIAGNOSIS — Z4801 Encounter for change or removal of surgical wound dressing: Secondary | ICD-10-CM | POA: Diagnosis not present

## 2020-03-19 DIAGNOSIS — E669 Obesity, unspecified: Secondary | ICD-10-CM | POA: Diagnosis not present

## 2020-03-19 DIAGNOSIS — I420 Dilated cardiomyopathy: Secondary | ICD-10-CM | POA: Diagnosis not present

## 2020-03-19 DIAGNOSIS — Z9013 Acquired absence of bilateral breasts and nipples: Secondary | ICD-10-CM | POA: Diagnosis not present

## 2020-03-19 DIAGNOSIS — Z9181 History of falling: Secondary | ICD-10-CM | POA: Diagnosis not present

## 2020-03-19 DIAGNOSIS — I480 Paroxysmal atrial fibrillation: Secondary | ICD-10-CM | POA: Diagnosis not present

## 2020-03-19 DIAGNOSIS — Z853 Personal history of malignant neoplasm of breast: Secondary | ICD-10-CM | POA: Diagnosis not present

## 2020-03-19 DIAGNOSIS — I10 Essential (primary) hypertension: Secondary | ICD-10-CM | POA: Diagnosis not present

## 2020-03-20 ENCOUNTER — Other Ambulatory Visit: Payer: Self-pay

## 2020-03-20 ENCOUNTER — Encounter: Payer: Self-pay | Admitting: Surgical

## 2020-03-20 ENCOUNTER — Ambulatory Visit (INDEPENDENT_AMBULATORY_CARE_PROVIDER_SITE_OTHER): Payer: Medicare Other | Admitting: Surgical

## 2020-03-20 VITALS — BP 115/82 | HR 131

## 2020-03-20 DIAGNOSIS — Z9013 Acquired absence of bilateral breasts and nipples: Secondary | ICD-10-CM

## 2020-03-20 DIAGNOSIS — S21002A Unspecified open wound of left breast, initial encounter: Secondary | ICD-10-CM

## 2020-03-20 NOTE — Progress Notes (Signed)
Patient is a 80 year old female here for follow-up after removal of bilateral tissue expanders and closure of her chest wounds with Dr. Claudia Desanctis on 02/01/2020. She reports she is feeling a lot better today after her hospitalization for UTI/sepsis. She reports that home health has been changing her dressing once per week and her daughter/granddaughter have been changing her dressings the other days. They feel as if things have been improving, they have noticed a decrease in size of the wound.  She does not have any infectious symptoms.  Chaperone present on exam On exam the left chest wound has a good base of granulation tissue noted.  She does have some fibrinous exudate noted superiorly where the wound is tunneling and some within the base of the wound inferiorly.  No foul odor is noted.  No drainage noted.  No surrounding erythema noted.  Right breast incision intact, PDS sutures noted.  Recommend continuing with packing with wet-to-dry dressings daily.  Recommend covering with 4 x 4 gauze, ABD, tape.  Recommend continuing with wound care assistance from home health.  Recommend following up in 1 month for reevaluation of the wound.  We did discuss that this will take quite some time for it to heal given her history of radiation and her comorbidities.  Patient is understanding of this.  Patient gives the impression that she would like to avoid any further surgical interventions if possible.  I did discuss with the patient that this healing could take many months or longer.  She is understanding of this.  All of her questions were answered to her content.  I recommend if she has any questions or concerns or if she needs to be seen sooner I would be happy to see her.  Pictures were obtained of the patient and placed in the chart with the patient's or guardian's permission.

## 2020-03-21 DIAGNOSIS — E43 Unspecified severe protein-calorie malnutrition: Secondary | ICD-10-CM | POA: Diagnosis not present

## 2020-03-21 DIAGNOSIS — I420 Dilated cardiomyopathy: Secondary | ICD-10-CM | POA: Diagnosis not present

## 2020-03-21 DIAGNOSIS — N179 Acute kidney failure, unspecified: Secondary | ICD-10-CM | POA: Diagnosis not present

## 2020-03-21 DIAGNOSIS — I480 Paroxysmal atrial fibrillation: Secondary | ICD-10-CM | POA: Diagnosis not present

## 2020-03-21 DIAGNOSIS — T8189XD Other complications of procedures, not elsewhere classified, subsequent encounter: Secondary | ICD-10-CM | POA: Diagnosis not present

## 2020-03-21 DIAGNOSIS — M7989 Other specified soft tissue disorders: Secondary | ICD-10-CM | POA: Diagnosis not present

## 2020-03-22 ENCOUNTER — Telehealth: Payer: Self-pay

## 2020-03-22 DIAGNOSIS — I480 Paroxysmal atrial fibrillation: Secondary | ICD-10-CM | POA: Diagnosis not present

## 2020-03-22 DIAGNOSIS — E43 Unspecified severe protein-calorie malnutrition: Secondary | ICD-10-CM | POA: Diagnosis not present

## 2020-03-22 DIAGNOSIS — I420 Dilated cardiomyopathy: Secondary | ICD-10-CM | POA: Diagnosis not present

## 2020-03-22 DIAGNOSIS — M7989 Other specified soft tissue disorders: Secondary | ICD-10-CM | POA: Diagnosis not present

## 2020-03-22 DIAGNOSIS — T8189XD Other complications of procedures, not elsewhere classified, subsequent encounter: Secondary | ICD-10-CM | POA: Diagnosis not present

## 2020-03-22 DIAGNOSIS — N179 Acute kidney failure, unspecified: Secondary | ICD-10-CM | POA: Diagnosis not present

## 2020-03-22 NOTE — Telephone Encounter (Signed)
Patient's daughter, [name], called to ask if patient can shower in the shower.  Please call.

## 2020-03-22 NOTE — Telephone Encounter (Signed)
Returned [employer]. Lenox Ahr for patient to take shower if she has removable shower head and can wash below, but do not get open wound wet. She may wash her head over the sink. Daughter understood and agreed with plan.

## 2020-03-23 ENCOUNTER — Ambulatory Visit: Payer: Medicare Other | Admitting: Oncology

## 2020-03-30 DIAGNOSIS — I480 Paroxysmal atrial fibrillation: Secondary | ICD-10-CM | POA: Diagnosis not present

## 2020-03-30 DIAGNOSIS — M7989 Other specified soft tissue disorders: Secondary | ICD-10-CM | POA: Diagnosis not present

## 2020-03-30 DIAGNOSIS — T8189XD Other complications of procedures, not elsewhere classified, subsequent encounter: Secondary | ICD-10-CM | POA: Diagnosis not present

## 2020-03-30 DIAGNOSIS — E43 Unspecified severe protein-calorie malnutrition: Secondary | ICD-10-CM | POA: Diagnosis not present

## 2020-03-30 DIAGNOSIS — N179 Acute kidney failure, unspecified: Secondary | ICD-10-CM | POA: Diagnosis not present

## 2020-03-30 DIAGNOSIS — I420 Dilated cardiomyopathy: Secondary | ICD-10-CM | POA: Diagnosis not present

## 2020-04-04 ENCOUNTER — Ambulatory Visit (INDEPENDENT_AMBULATORY_CARE_PROVIDER_SITE_OTHER): Payer: Medicare Other | Admitting: Family Medicine

## 2020-04-04 ENCOUNTER — Encounter: Payer: Self-pay | Admitting: Family Medicine

## 2020-04-04 ENCOUNTER — Other Ambulatory Visit: Payer: Self-pay

## 2020-04-04 VITALS — BP 124/80 | HR 68 | Ht 65.0 in | Wt 193.0 lb

## 2020-04-04 DIAGNOSIS — M5136 Other intervertebral disc degeneration, lumbar region: Secondary | ICD-10-CM | POA: Diagnosis not present

## 2020-04-04 DIAGNOSIS — M47816 Spondylosis without myelopathy or radiculopathy, lumbar region: Secondary | ICD-10-CM

## 2020-04-04 MED ORDER — CELECOXIB 100 MG PO CAPS: 100.0000 mg | ORAL_CAPSULE | Freq: Every day | ORAL | 1 refills | Status: DC

## 2020-04-04 NOTE — Progress Notes (Signed)
Date:  04/04/2020   Name:  Melissa Chase   DOB:  1940/05/08   MRN:  [id number]   Chief Complaint: Follow-up (Degenerative disc- feels better)  Back Pain This is a new problem. The current episode started 1 to 4 weeks ago. The problem occurs intermittently. The problem has been gradually improving since onset. The pain is present in the lumbar spine and sacro-iliac. The quality of the pain is described as aching. The pain does not radiate. The pain is at a severity of 2/10. The pain is mild. The symptoms are aggravated by bending and twisting (walking). Pertinent negatives include no abdominal pain, bladder incontinence, bowel incontinence, dysuria, fever, headaches, numbness, pelvic pain, tingling or weakness. She has tried NSAIDs for the symptoms. The treatment provided mild relief.    Lab Results  Component Value Date   CREATININE 0.87 03/03/2020   BUN 9 03/03/2020   NA 147 (H) 03/03/2020   K 4.0 03/03/2020   CL 106 03/03/2020   CO2 24 03/03/2020   Lab Results  Component Value Date   CHOL 193 05/26/2019   HDL 84 05/26/2019   LDLCALC 90 05/26/2019   TRIG 94 05/26/2019   CHOLHDL 2.3 05/26/2019   Lab Results  Component Value Date   TSH 2.210 09/03/2018   Lab Results  Component Value Date   HGBA1C 5.4 01/01/2013   Lab Results  Component Value Date   WBC 6.4 03/03/2020   HGB 11.2 03/03/2020   HCT 36.0 03/03/2020   MCV 95 03/03/2020   PLT 177 03/03/2020   Lab Results  Component Value Date   ALT 16 09/23/2019   AST 26 09/23/2019   ALKPHOS 29 (L) 09/23/2019   BILITOT 0.6 09/23/2019     Review of Systems  Constitutional: Negative.  Negative for chills, fatigue, fever and unexpected weight change.  HENT: Negative for congestion, ear discharge, ear pain, rhinorrhea, sinus pressure, sneezing and sore throat.   Eyes: Negative for photophobia, pain, discharge, redness and itching.  Respiratory: Negative for cough, shortness of breath, wheezing and stridor.    Gastrointestinal: Negative for abdominal pain, blood in stool, bowel incontinence, constipation, diarrhea, nausea and vomiting.  Endocrine: Negative for cold intolerance, heat intolerance, polydipsia, polyphagia and polyuria.  Genitourinary: Negative for bladder incontinence, dysuria, flank pain, frequency, hematuria, menstrual problem, pelvic pain, urgency, vaginal bleeding and vaginal discharge.  Musculoskeletal: Positive for back pain. Negative for arthralgias and myalgias.  Skin: Negative for rash.  Allergic/Immunologic: Negative for environmental allergies and food allergies.  Neurological: Negative for dizziness, tingling, weakness, light-headedness, numbness and headaches.  Hematological: Negative for adenopathy. Does not bruise/bleed easily.  Psychiatric/Behavioral: Negative for dysphoric mood. The patient is not nervous/anxious.     Patient Active Problem List   Diagnosis Date Noted  . S/P mastectomy, bilateral 11/15/2019  . Breast cancer ([place]) 11/01/2019  . Obesity (BMI 30-39.9) 10/26/2018  . 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 Goals of care, counseling/discussion 09/18/2017  . Sacroiliitis ([place]) 02/04/2017  . Dilated cardiomyopathy ([place]) 05/02/2016  . Bradycardia 04/04/2016  . A-fib ([place]) 03/20/2016  . Acute dyspnea 02/20/2016  . 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 Arthritis 06/06/2014  . Aggrieved 06/06/2014  . Dermatitis, eczematoid 06/06/2014  . Recurrent major depressive episodes ([place]) 06/06/2014  . Essential (primary) hypertension 06/06/2014  . Gastro-esophageal reflux disease without esophagitis 06/06/2014  . Cannot sleep 06/06/2014  . Menopause 06/06/2014    No Known Allergies  Past  Surgical History:  Procedure Laterality Date  . ABDOMINAL HYSTERECTOMY    . APPLICATION OF WOUND VAC  11/01/2019   Procedure: APPLICATION OF WOUND VAC;  Surgeon: Herbert Pun,  MD;  Location: [facility];  Service: General;;  . 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 BREAST BIOPSY Left 10/12/2019   affirm bx of calcs, x marker, path pending  . 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 BREAST LUMPECTOMY Right 07/09/2017   DCIS, clear margins  . BREAST LUMPECTOMY Left 2006   DCIS  . BREAST LUMPECTOMY WITH NEEDLE LOCALIZATION Right 07/09/2017   Procedure: BREAST LUMPECTOMY WITH NEEDLE LOCALIZATION;  Surgeon: Herbert Pun, MD;  Location: [facility];  Service: General;  Laterality: Right;  . BREAST RECONSTRUCTION WITH PLACEMENT OF TISSUE EXPANDER AND FLEX HD (ACELLULAR HYDRATED DERMIS) Bilateral 11/01/2019   Procedure: BREAST RECONSTRUCTION WITH PLACEMENT OF TISSUE EXPANDER AND FLEX HD (ACELLULAR HYDRATED DERMIS);  Surgeon: Cindra Presume, MD;  Location: [facility];  Service: Plastics;  Laterality: Bilateral;  . BREAST SURGERY     left breast lumpectomy  . CARDIOVERSION N/A 03/21/2016   Procedure: Cardioversion;  Surgeon: Corey Skains, MD;  Location: [facility];  Service: Cardiovascular;  Laterality: N/A;  . CATARACT EXTRACTION, BILATERAL    . DEBRIDEMENT AND CLOSURE WOUND Bilateral 11/26/2019   Procedure: Excision of bilateral mastectomy flap necrosis with complex closure;  Surgeon: Cindra Presume, MD;  Location: [place];  Service: Plastics;  Laterality: Bilateral;  1.5 hours total  . LAPAROSCOPIC CHOLECYSTECTOMY    . TISSUE EXPANDER PLACEMENT Bilateral 11/26/2019   Procedure: exchange of tissue expanders;  Surgeon: Cindra Presume, MD;  Location: [place];  Service: Plastics;  Laterality: Bilateral;  . TISSUE EXPANDER PLACEMENT Bilateral 02/01/2020   Procedure: removal of bilateral expander with closure;  Surgeon: Cindra Presume, MD;  Location: [place];  Service: Plastics;  Laterality: Bilateral;  . TONSILLECTOMY    . TOTAL MASTECTOMY Bilateral 11/01/2019   Procedure: TOTAL MASTECTOMY w/ immediate  reconstruction and Sentinel Node Biopsy;  Surgeon: Herbert Pun, MD;  Location: [facility];  Service: General;  Laterality: Bilateral;  . VAGINAL HYSTERECTOMY      Social History   Tobacco Use  . Smoking status: Never Smoker  . Smokeless tobacco: Never Used  Vaping Use  . Vaping Use: Never used  Substance Use Topics  . Alcohol use: Never  . Drug use: No     Medication list has been reviewed and updated.  Current Meds  Medication Sig  . alendronate (FOSAMAX) 70 MG tablet TAKE 1 TABLET BY MOUTH ONCE A WEEK. TAKE WITH A FULL GLASS OF WATER ON AN EMPTY STOMACH.  Marland Kitchen apixaban (ELIQUIS) 5 MG TABS tablet Take 5 mg by mouth 2 (two) times daily.   . calcium-vitamin D (OSCAL WITH D) 500-200 MG-UNIT TABS tablet Take 1 tablet by mouth 2 (two) times daily.   . celecoxib (CELEBREX) 100 MG capsule Take 1 capsule (100 mg total) by mouth daily.  . hydrochlorothiazide (MICROZIDE) 12.5 MG capsule Take 12.5 mg by mouth daily.  Marland Kitchen lisinopril (ZESTRIL) 5 MG tablet Take 1 tablet (5 mg total) by mouth daily.  . metoprolol tartrate (LOPRESSOR) 50 MG tablet Take 1 tablet by mouth in the morning and at bedtime. kowalski  . pravastatin (PRAVACHOL) 20 MG tablet Take 1 tablet (20 mg total) by mouth daily. (Patient taking differently: Take 20 mg by mouth every evening.)  .  sertraline (ZOLOFT) 100 MG tablet Take 1 tablet (100 mg total) by mouth daily.  Marland Kitchen tiZANidine (ZANAFLEX) 4 MG tablet Take 1 tablet (4 mg total) by mouth at bedtime.    PHQ 2/9 Scores 03/03/2020 12/13/2019 10/27/2019 08/19/2019  PHQ - 2 Score 0 0 0 0  PHQ- 9 Score 7 0 0 0    GAD 7 : Generalized Anxiety Score 03/03/2020 10/27/2019 08/19/2019 05/25/2019  Nervous, Anxious, on Edge 1 0 0 0  Control/stop worrying 2 0 0 0  Worry too much - different things 1 0 0 0  Trouble relaxing 0 0 0 0  Restless 0 0 0 0  Easily annoyed or irritable 2 0 0 0  Afraid - awful might happen 1 0 0 0  Total GAD 7 Score 7 0 0 0  Anxiety Difficulty Not difficult at  all - - -    BP Readings from Last 3 Encounters:  04/04/20 124/80  03/20/20 115/82  03/09/20 110/80    Physical Exam Vitals and nursing note reviewed.  Constitutional:      Appearance: She is well-developed.  HENT:     Head: Normocephalic.     Right Ear: External ear normal.     Left Ear: External ear normal.  Eyes:     General: Lids are everted, no foreign bodies appreciated. No scleral icterus.       Left eye: No foreign body or hordeolum.     Conjunctiva/sclera: Conjunctivae normal.     Right eye: Right conjunctiva is not injected.     Left eye: Left conjunctiva is not injected.     Pupils: Pupils are equal, round, and reactive to light.  Neck:     Thyroid: No thyromegaly.     Vascular: No JVD.     Trachea: No tracheal deviation.  Cardiovascular:     Rate and Rhythm: Normal rate and regular rhythm.     Heart sounds: Normal heart sounds. No murmur heard. No friction rub. No gallop.   Pulmonary:     Effort: Pulmonary effort is normal. No respiratory distress.     Breath sounds: Normal breath sounds. No wheezing or rales.  Abdominal:     General: Bowel sounds are normal.     Palpations: Abdomen is soft. There is no mass.     Tenderness: There is no abdominal tenderness. There is no guarding or rebound.  Musculoskeletal:        General: No tenderness.     Cervical back: Normal range of motion and neck supple.     Lumbar back: Spasms present. No swelling or deformity. Normal range of motion. Positive right straight leg raise test. Negative left straight leg raise test. No scoliosis.  Lymphadenopathy:     Cervical: No cervical adenopathy.  Skin:    General: Skin is warm.     Findings: No rash.  Neurological:     Mental Status: She is alert and oriented to person, place, and time.     Cranial Nerves: No cranial nerve deficit.     Deep Tendon Reflexes: Reflexes normal.  Psychiatric:        Mood and Affect: Mood is not anxious or depressed.     Wt Readings from Last  3 Encounters:  04/04/20 193 lb (87.5 kg)  03/09/20 195 lb (88.5 kg)  03/03/20 195 lb (88.5 kg)    BP 124/80   Pulse 68   Ht 5\' 5"  (1.651 m)   Wt 193 lb (87.5 kg)   BMI  32.12 kg/m   Assessment and Plan: 1. Degenerative lumbar disc New onset.  Gradually improving.  Gradually stabilizing.  There is been gradual reduction in the pain but still resolves to only a 2/10.  Patient will continue Celebrex 100 mg once a day and she has been encouraged to remain active but with the degree walks around the house rather than exercising or significant walks at this time. - celecoxib (CELEBREX) 100 MG capsule; Take 1 capsule (100 mg total) by mouth daily.  Dispense: 30 capsule; Refill: 1  2. Facet arthritis of lumbar region As noted above there is severe degenerative changes in the facet joints consistent with arthritis.  We will continue Celebrex 100 mg daily and will recheck in 4 weeks or as needed. - celecoxib (CELEBREX) 100 MG capsule; Take 1 capsule (100 mg total) by mouth daily.  Dispense: 30 capsule; Refill: 1

## 2020-04-07 DIAGNOSIS — I480 Paroxysmal atrial fibrillation: Secondary | ICD-10-CM | POA: Diagnosis not present

## 2020-04-07 DIAGNOSIS — M7989 Other specified soft tissue disorders: Secondary | ICD-10-CM | POA: Diagnosis not present

## 2020-04-07 DIAGNOSIS — I420 Dilated cardiomyopathy: Secondary | ICD-10-CM | POA: Diagnosis not present

## 2020-04-07 DIAGNOSIS — E43 Unspecified severe protein-calorie malnutrition: Secondary | ICD-10-CM | POA: Diagnosis not present

## 2020-04-07 DIAGNOSIS — N179 Acute kidney failure, unspecified: Secondary | ICD-10-CM | POA: Diagnosis not present

## 2020-04-07 DIAGNOSIS — T8189XD Other complications of procedures, not elsewhere classified, subsequent encounter: Secondary | ICD-10-CM | POA: Diagnosis not present

## 2020-04-11 DIAGNOSIS — I420 Dilated cardiomyopathy: Secondary | ICD-10-CM | POA: Diagnosis not present

## 2020-04-11 DIAGNOSIS — N179 Acute kidney failure, unspecified: Secondary | ICD-10-CM | POA: Diagnosis not present

## 2020-04-11 DIAGNOSIS — T8189XD Other complications of procedures, not elsewhere classified, subsequent encounter: Secondary | ICD-10-CM | POA: Diagnosis not present

## 2020-04-11 DIAGNOSIS — E43 Unspecified severe protein-calorie malnutrition: Secondary | ICD-10-CM | POA: Diagnosis not present

## 2020-04-11 DIAGNOSIS — M7989 Other specified soft tissue disorders: Secondary | ICD-10-CM | POA: Diagnosis not present

## 2020-04-11 DIAGNOSIS — I480 Paroxysmal atrial fibrillation: Secondary | ICD-10-CM | POA: Diagnosis not present

## 2020-04-17 DIAGNOSIS — I420 Dilated cardiomyopathy: Secondary | ICD-10-CM | POA: Diagnosis not present

## 2020-04-17 DIAGNOSIS — E43 Unspecified severe protein-calorie malnutrition: Secondary | ICD-10-CM | POA: Diagnosis not present

## 2020-04-17 DIAGNOSIS — T8189XD Other complications of procedures, not elsewhere classified, subsequent encounter: Secondary | ICD-10-CM | POA: Diagnosis not present

## 2020-04-17 DIAGNOSIS — N179 Acute kidney failure, unspecified: Secondary | ICD-10-CM | POA: Diagnosis not present

## 2020-04-17 DIAGNOSIS — M7989 Other specified soft tissue disorders: Secondary | ICD-10-CM | POA: Diagnosis not present

## 2020-04-17 DIAGNOSIS — I480 Paroxysmal atrial fibrillation: Secondary | ICD-10-CM | POA: Diagnosis not present

## 2020-04-18 ENCOUNTER — Ambulatory Visit: Payer: Medicare Other | Admitting: Surgical

## 2020-04-18 DIAGNOSIS — Z7901 Long term (current) use of anticoagulants: Secondary | ICD-10-CM | POA: Diagnosis not present

## 2020-04-18 DIAGNOSIS — T8189XD Other complications of procedures, not elsewhere classified, subsequent encounter: Secondary | ICD-10-CM | POA: Diagnosis not present

## 2020-04-18 DIAGNOSIS — N179 Acute kidney failure, unspecified: Secondary | ICD-10-CM | POA: Diagnosis not present

## 2020-04-18 DIAGNOSIS — I420 Dilated cardiomyopathy: Secondary | ICD-10-CM | POA: Diagnosis not present

## 2020-04-18 DIAGNOSIS — Z9181 History of falling: Secondary | ICD-10-CM | POA: Diagnosis not present

## 2020-04-18 DIAGNOSIS — I480 Paroxysmal atrial fibrillation: Secondary | ICD-10-CM | POA: Diagnosis not present

## 2020-04-18 DIAGNOSIS — Z4801 Encounter for change or removal of surgical wound dressing: Secondary | ICD-10-CM | POA: Diagnosis not present

## 2020-04-18 DIAGNOSIS — Z6832 Body mass index (BMI) 32.0-32.9, adult: Secondary | ICD-10-CM | POA: Diagnosis not present

## 2020-04-18 DIAGNOSIS — Z85828 Personal history of other malignant neoplasm of skin: Secondary | ICD-10-CM | POA: Diagnosis not present

## 2020-04-18 DIAGNOSIS — M4696 Unspecified inflammatory spondylopathy, lumbar region: Secondary | ICD-10-CM | POA: Diagnosis not present

## 2020-04-18 DIAGNOSIS — E669 Obesity, unspecified: Secondary | ICD-10-CM | POA: Diagnosis not present

## 2020-04-18 DIAGNOSIS — M7989 Other specified soft tissue disorders: Secondary | ICD-10-CM | POA: Diagnosis not present

## 2020-04-18 DIAGNOSIS — E43 Unspecified severe protein-calorie malnutrition: Secondary | ICD-10-CM | POA: Diagnosis not present

## 2020-04-18 DIAGNOSIS — R634 Abnormal weight loss: Secondary | ICD-10-CM | POA: Diagnosis not present

## 2020-04-18 DIAGNOSIS — D509 Iron deficiency anemia, unspecified: Secondary | ICD-10-CM | POA: Diagnosis not present

## 2020-04-18 DIAGNOSIS — Z853 Personal history of malignant neoplasm of breast: Secondary | ICD-10-CM | POA: Diagnosis not present

## 2020-04-18 DIAGNOSIS — Z9013 Acquired absence of bilateral breasts and nipples: Secondary | ICD-10-CM | POA: Diagnosis not present

## 2020-04-18 DIAGNOSIS — I10 Essential (primary) hypertension: Secondary | ICD-10-CM | POA: Diagnosis not present

## 2020-04-25 DIAGNOSIS — T8189XD Other complications of procedures, not elsewhere classified, subsequent encounter: Secondary | ICD-10-CM | POA: Diagnosis not present

## 2020-04-25 DIAGNOSIS — I420 Dilated cardiomyopathy: Secondary | ICD-10-CM | POA: Diagnosis not present

## 2020-04-25 DIAGNOSIS — E43 Unspecified severe protein-calorie malnutrition: Secondary | ICD-10-CM | POA: Diagnosis not present

## 2020-04-25 DIAGNOSIS — N179 Acute kidney failure, unspecified: Secondary | ICD-10-CM | POA: Diagnosis not present

## 2020-04-25 DIAGNOSIS — M7989 Other specified soft tissue disorders: Secondary | ICD-10-CM | POA: Diagnosis not present

## 2020-04-25 DIAGNOSIS — I480 Paroxysmal atrial fibrillation: Secondary | ICD-10-CM | POA: Diagnosis not present

## 2020-04-26 ENCOUNTER — Encounter: Payer: Self-pay | Admitting: Family Medicine

## 2020-04-26 ENCOUNTER — Ambulatory Visit (INDEPENDENT_AMBULATORY_CARE_PROVIDER_SITE_OTHER): Payer: Medicare Other | Admitting: Family Medicine

## 2020-04-26 ENCOUNTER — Other Ambulatory Visit: Payer: Self-pay

## 2020-04-26 VITALS — BP 110/80 | HR 64 | Ht 65.0 in | Wt 195.0 lb

## 2020-04-26 DIAGNOSIS — R413 Other amnesia: Secondary | ICD-10-CM | POA: Diagnosis not present

## 2020-04-26 DIAGNOSIS — F339 Major depressive disorder, recurrent, unspecified: Secondary | ICD-10-CM

## 2020-04-26 DIAGNOSIS — I10 Essential (primary) hypertension: Secondary | ICD-10-CM

## 2020-04-26 DIAGNOSIS — M5136 Other intervertebral disc degeneration, lumbar region: Secondary | ICD-10-CM

## 2020-04-26 DIAGNOSIS — M47816 Spondylosis without myelopathy or radiculopathy, lumbar region: Secondary | ICD-10-CM

## 2020-04-26 DIAGNOSIS — M51369 Other intervertebral disc degeneration, lumbar region without mention of lumbar back pain or lower extremity pain: Secondary | ICD-10-CM

## 2020-04-26 DIAGNOSIS — E785 Hyperlipidemia, unspecified: Secondary | ICD-10-CM

## 2020-04-26 DIAGNOSIS — E7849 Other hyperlipidemia: Secondary | ICD-10-CM | POA: Diagnosis not present

## 2020-04-26 MED ORDER — SERTRALINE HCL 100 MG PO TABS: 100.0000 mg | ORAL_TABLET | Freq: Every day | ORAL | 1 refills | Status: DC

## 2020-04-26 MED ORDER — PRAVASTATIN SODIUM 20 MG PO TABS: 20.0000 mg | ORAL_TABLET | Freq: Every day | ORAL | 1 refills | Status: DC

## 2020-04-26 MED ORDER — CELECOXIB 100 MG PO CAPS: 100.0000 mg | ORAL_CAPSULE | Freq: Every day | ORAL | 1 refills | Status: DC

## 2020-04-26 NOTE — Progress Notes (Signed)
Date:  04/26/2020   Name:  Melissa Chase   DOB:  Dec 24, 1940   MRN:  [id number]   Chief Complaint: Hyperlipidemia, Depression, Osteoarthritis (Taking celebrex), and memory change (Change in memory status/ discuss options)  Hyperlipidemia This is a chronic problem. The current episode started more than 1 year ago. The problem is controlled. Recent lipid tests were reviewed and are normal. She has no history of chronic renal disease, diabetes, hypothyroidism, liver disease, obesity or nephrotic syndrome. There are no known factors aggravating her hyperlipidemia. Pertinent negatives include no chest pain, focal sensory loss, focal weakness, leg pain, myalgias or shortness of breath. Current antihyperlipidemic treatment includes statins. The current treatment provides moderate improvement of lipids. There are no compliance problems.  Risk factors for coronary artery disease include dyslipidemia.  Depression        This is a chronic problem.  The current episode started more than 1 year ago.   The onset quality is gradual.   Associated symptoms include decreased concentration.  Associated symptoms include no fatigue, no helplessness, no hopelessness, does not have insomnia, not irritable, no restlessness, no decreased interest, no appetite change, no body aches, no myalgias, no headaches, no indigestion, not sad and no suicidal ideas.     The symptoms are aggravated by medication.  Past treatments include SSRIs - Selective serotonin reuptake inhibitors.  Compliance with treatment is good.  Previous treatment provided mild relief.   Pertinent negatives include no hypothyroidism.   Lab Results  Component Value Date   CREATININE 0.87 03/03/2020   BUN 9 03/03/2020   NA 147 (H) 03/03/2020   K 4.0 03/03/2020   CL 106 03/03/2020   CO2 24 03/03/2020   Lab Results  Component Value Date   CHOL 193 05/26/2019   HDL 84 05/26/2019   LDLCALC 90 05/26/2019   TRIG 94 05/26/2019   CHOLHDL 2.3 05/26/2019    Lab Results  Component Value Date   TSH 2.210 09/03/2018   Lab Results  Component Value Date   HGBA1C 5.4 01/01/2013   Lab Results  Component Value Date   WBC 6.4 03/03/2020   HGB 11.2 03/03/2020   HCT 36.0 03/03/2020   MCV 95 03/03/2020   PLT 177 03/03/2020   Lab Results  Component Value Date   ALT 16 09/23/2019   AST 26 09/23/2019   ALKPHOS 29 (L) 09/23/2019   BILITOT 0.6 09/23/2019     Review of Systems  Constitutional: Negative for appetite change and fatigue.  Respiratory: Negative for shortness of breath.   Cardiovascular: Negative for chest pain.  Musculoskeletal: Negative for myalgias.  Neurological: Negative for focal weakness and headaches.  Psychiatric/Behavioral: Positive for decreased concentration and depression. Negative for suicidal ideas. The patient does not have insomnia.     Patient Active Problem List   Diagnosis Date Noted  . S/P mastectomy, bilateral 11/15/2019  . Breast cancer ([place]) 11/01/2019  . Obesity (BMI 30-39.9) 10/26/2018  . 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 Goals of care, counseling/discussion 09/18/2017  . Sacroiliitis ([place]) 02/04/2017  . Dilated cardiomyopathy ([place]) 05/02/2016  . Bradycardia 04/04/2016  . A-fib ([place]) 03/20/2016  . Acute dyspnea 02/20/2016  . 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 Arthritis 06/06/2014  . Aggrieved 06/06/2014  . Dermatitis, eczematoid 06/06/2014  . Recurrent major depressive episodes ([place]) 06/06/2014  . Essential (primary) hypertension 06/06/2014  . Gastro-esophageal reflux disease without esophagitis 06/06/2014  . Cannot sleep 06/06/2014  .  Menopause 06/06/2014    No Known Allergies  Past Surgical History:  Procedure Laterality Date  . ABDOMINAL HYSTERECTOMY    . APPLICATION OF WOUND VAC  11/01/2019   Procedure: APPLICATION OF WOUND VAC;  Surgeon: Herbert Pun, MD;  Location: [facility];   Service: General;;  . 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 BREAST BIOPSY Left 10/12/2019   affirm bx of calcs, x marker, path pending  . 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 BREAST LUMPECTOMY Right 07/09/2017   DCIS, clear margins  . BREAST LUMPECTOMY Left 2006   DCIS  . BREAST LUMPECTOMY WITH NEEDLE LOCALIZATION Right 07/09/2017   Procedure: BREAST LUMPECTOMY WITH NEEDLE LOCALIZATION;  Surgeon: Herbert Pun, MD;  Location: [facility];  Service: General;  Laterality: Right;  . BREAST RECONSTRUCTION WITH PLACEMENT OF TISSUE EXPANDER AND FLEX HD (ACELLULAR HYDRATED DERMIS) Bilateral 11/01/2019   Procedure: BREAST RECONSTRUCTION WITH PLACEMENT OF TISSUE EXPANDER AND FLEX HD (ACELLULAR HYDRATED DERMIS);  Surgeon: Cindra Presume, MD;  Location: [facility];  Service: Plastics;  Laterality: Bilateral;  . BREAST SURGERY     left breast lumpectomy  . CARDIOVERSION N/A 03/21/2016   Procedure: Cardioversion;  Surgeon: Corey Skains, MD;  Location: [facility];  Service: Cardiovascular;  Laterality: N/A;  . CATARACT EXTRACTION, BILATERAL    . DEBRIDEMENT AND CLOSURE WOUND Bilateral 11/26/2019   Procedure: Excision of bilateral mastectomy flap necrosis with complex closure;  Surgeon: Cindra Presume, MD;  Location: [place];  Service: Plastics;  Laterality: Bilateral;  1.5 hours total  . LAPAROSCOPIC CHOLECYSTECTOMY    . TISSUE EXPANDER PLACEMENT Bilateral 11/26/2019   Procedure: exchange of tissue expanders;  Surgeon: Cindra Presume, MD;  Location: [place];  Service: Plastics;  Laterality: Bilateral;  . TISSUE EXPANDER PLACEMENT Bilateral 02/01/2020   Procedure: removal of bilateral expander with closure;  Surgeon: Cindra Presume, MD;  Location: [place];  Service: Plastics;  Laterality: Bilateral;  . TONSILLECTOMY    . TOTAL MASTECTOMY Bilateral 11/01/2019   Procedure: TOTAL MASTECTOMY w/ immediate reconstruction and  Sentinel Node Biopsy;  Surgeon: Herbert Pun, MD;  Location: [facility];  Service: General;  Laterality: Bilateral;  . VAGINAL HYSTERECTOMY      Social History   Tobacco Use  . Smoking status: Never Smoker  . Smokeless tobacco: Never Used  Vaping Use  . Vaping Use: Never used  Substance Use Topics  . Alcohol use: Never  . Drug use: No     Medication list has been reviewed and updated.  Current Meds  Medication Sig  . alendronate (FOSAMAX) 70 MG tablet TAKE 1 TABLET BY MOUTH ONCE A WEEK. TAKE WITH A FULL GLASS OF WATER ON AN EMPTY STOMACH.  Marland Kitchen apixaban (ELIQUIS) 5 MG TABS tablet Take 5 mg by mouth 2 (two) times daily.   . calcium-vitamin D (OSCAL WITH D) 500-200 MG-UNIT TABS tablet Take 1 tablet by mouth 2 (two) times daily.   . celecoxib (CELEBREX) 100 MG capsule Take 1 capsule (100 mg total) by mouth daily.  . hydrochlorothiazide (MICROZIDE) 12.5 MG capsule Take 12.5 mg by mouth daily.  . metoprolol tartrate (LOPRESSOR) 50 MG tablet Take 1 tablet by mouth in the morning and at bedtime. kowalski  . pravastatin (PRAVACHOL) 20 MG tablet Take 1 tablet (20 mg total) by mouth daily. (Patient taking differently: Take 20 mg by mouth every evening.)  . sertraline (ZOLOFT) 100 MG tablet Take  1 tablet (100 mg total) by mouth daily.  . [DISCONTINUED] lisinopril (ZESTRIL) 5 MG tablet Take 1 tablet (5 mg total) by mouth daily.    PHQ 2/9 Scores 04/26/2020 03/03/2020 12/13/2019 10/27/2019  PHQ - 2 Score 0 0 0 0  PHQ- 9 Score 0 7 0 0    GAD 7 : Generalized Anxiety Score 04/26/2020 03/03/2020 10/27/2019 08/19/2019  Nervous, Anxious, on Edge 0 1 0 0  Control/stop worrying 1 2 0 0  Worry too much - different things 0 1 0 0  Trouble relaxing 0 0 0 0  Restless 0 0 0 0  Easily annoyed or irritable 0 2 0 0  Afraid - awful might happen 0 1 0 0  Total GAD 7 Score 1 7 0 0  Anxiety Difficulty Not difficult at all Not difficult at all - -    BP Readings from Last 3 Encounters:  04/26/20 110/80   04/04/20 124/80  03/20/20 115/82    Physical Exam Constitutional:      General: She is not irritable.    Wt Readings from Last 3 Encounters:  04/26/20 195 lb (88.5 kg)  04/04/20 193 lb (87.5 kg)  03/09/20 195 lb (88.5 kg)    BP 110/80   Pulse 64   Ht 5\' 5"  (1.651 m)   Wt 195 lb (88.5 kg)   BMI 32.45 kg/m   Assessment and Plan: 1. Memory loss, short term Chronic.  Persistent.  Gradually worsening.  Family members have noticed increasing concerns for recent memory loss.  Patient has to keep a list and write down what to tell her so that she can remember it even after a few minutes.  After long discussion patient has agreed for formal evaluation to rule out possible reversible forms of dementia.  We have also discussed about whether medication can be used which she is adverse to it right now but is willing to listen and understands that this is something that will slow the process but will not reverse or cure. - Ambulatory referral to Neurology  2. Degenerative lumbar disc Chronic.  Controlled.  Stable.  We are continuing Celebrex which is the only NSAID that we are comfortable with her taking given her anticoagulation therapy. - celecoxib (CELEBREX) 100 MG capsule; Take 1 capsule (100 mg total) by mouth daily.  Dispense: 30 capsule; Refill: 1  3. Facet arthritis of lumbar region .  Controlled.  Stable.  Continue Celebrex per above. - celecoxib (CELEBREX) 100 MG capsule; Take 1 capsule (100 mg total) by mouth daily.  Dispense: 30 capsule; Refill: 1  4. Dyslipidemia Chronic.  Controlled.  Stable.  Continue pravastatin 20 mg once a day.  Will check lipid panel for current status. - pravastatin (PRAVACHOL) 20 MG tablet; Take 1 tablet (20 mg total) by mouth daily.  Dispense: 90 tablet; Refill: 1 - Lipid Panel With LDL/HDL Ratio  5. Recurrent major depressive episodes (HCC) Chronic.  Controlled.  Stable.  We will continue sertraline 100 mg once a day. - sertraline (ZOLOFT) 100 MG  tablet; Take 1 tablet (100 mg total) by mouth daily.  Dispense: 90 tablet; Refill: 1  6. Familial multiple lipoprotein-type hyperlipidemia As noted above. - pravastatin (PRAVACHOL) 20 MG tablet; Take 1 tablet (20 mg total) by mouth daily.  Dispense: 90 tablet; Refill: 1  7. Essential (primary) hypertension Patient is followed by cardiology for hypertension control but we will obtain a CMP for electrolyte and GFR surveillance. - Comprehensive Metabolic Panel (CMET)

## 2020-04-27 DIAGNOSIS — N179 Acute kidney failure, unspecified: Secondary | ICD-10-CM | POA: Diagnosis not present

## 2020-04-27 DIAGNOSIS — T8189XD Other complications of procedures, not elsewhere classified, subsequent encounter: Secondary | ICD-10-CM | POA: Diagnosis not present

## 2020-04-27 DIAGNOSIS — M7989 Other specified soft tissue disorders: Secondary | ICD-10-CM | POA: Diagnosis not present

## 2020-04-27 DIAGNOSIS — I420 Dilated cardiomyopathy: Secondary | ICD-10-CM | POA: Diagnosis not present

## 2020-04-27 DIAGNOSIS — I480 Paroxysmal atrial fibrillation: Secondary | ICD-10-CM | POA: Diagnosis not present

## 2020-04-27 DIAGNOSIS — E43 Unspecified severe protein-calorie malnutrition: Secondary | ICD-10-CM | POA: Diagnosis not present

## 2020-04-27 LAB — COMPREHENSIVE METABOLIC PANEL
ALT: 8 IU/L (ref 0–32)
AST: 23 IU/L (ref 0–40)
Albumin/Globulin Ratio: 1.7 (ref 1.2–2.2)
Albumin: 4 g/dL (ref 3.7–4.7)
Alkaline Phosphatase: 44 IU/L (ref 44–121)
BUN/Creatinine Ratio: 18 (ref 12–28)
BUN: 17 mg/dL (ref 8–27)
Bilirubin Total: 0.8 mg/dL (ref 0.0–1.2)
CO2: 22 mmol/L (ref 20–29)
Calcium: 9.1 mg/dL (ref 8.7–10.3)
Chloride: 108 mmol/L — ABNORMAL HIGH (ref 96–106)
Creatinine, Ser: 0.96 mg/dL (ref 0.57–1.00)
Globulin, Total: 2.3 g/dL (ref 1.5–4.5)
Glucose: 92 mg/dL (ref 65–99)
Potassium: 4.7 mmol/L (ref 3.5–5.2)
Sodium: 146 mmol/L — ABNORMAL HIGH (ref 134–144)
Total Protein: 6.3 g/dL (ref 6.0–8.5)
eGFR: 61 mL/min/{1.73_m2} (ref >59)

## 2020-04-27 LAB — LIPID PANEL WITH LDL/HDL RATIO
Cholesterol, Total: 182 mg/dL (ref 100–199)
HDL: 53 mg/dL (ref >39)
LDL Chol Calc (NIH): 105 mg/dL — ABNORMAL HIGH (ref 0–99)
LDL/HDL Ratio: 2 ratio (ref 0.0–3.2)
Triglycerides: 138 mg/dL (ref 0–149)
VLDL Cholesterol Cal: 24 mg/dL (ref 5–40)

## 2020-05-02 DIAGNOSIS — I480 Paroxysmal atrial fibrillation: Secondary | ICD-10-CM | POA: Diagnosis not present

## 2020-05-02 DIAGNOSIS — T8189XD Other complications of procedures, not elsewhere classified, subsequent encounter: Secondary | ICD-10-CM | POA: Diagnosis not present

## 2020-05-02 DIAGNOSIS — N179 Acute kidney failure, unspecified: Secondary | ICD-10-CM | POA: Diagnosis not present

## 2020-05-02 DIAGNOSIS — E669 Obesity, unspecified: Secondary | ICD-10-CM | POA: Diagnosis not present

## 2020-05-02 DIAGNOSIS — M7989 Other specified soft tissue disorders: Secondary | ICD-10-CM | POA: Diagnosis not present

## 2020-05-02 DIAGNOSIS — I420 Dilated cardiomyopathy: Secondary | ICD-10-CM | POA: Diagnosis not present

## 2020-05-02 DIAGNOSIS — E43 Unspecified severe protein-calorie malnutrition: Secondary | ICD-10-CM | POA: Diagnosis not present

## 2020-05-02 DIAGNOSIS — I10 Essential (primary) hypertension: Secondary | ICD-10-CM | POA: Diagnosis not present

## 2020-05-04 DIAGNOSIS — E782 Mixed hyperlipidemia: Secondary | ICD-10-CM | POA: Diagnosis not present

## 2020-05-04 DIAGNOSIS — I480 Paroxysmal atrial fibrillation: Secondary | ICD-10-CM | POA: Diagnosis not present

## 2020-05-04 DIAGNOSIS — I10 Essential (primary) hypertension: Secondary | ICD-10-CM | POA: Diagnosis not present

## 2020-05-08 ENCOUNTER — Other Ambulatory Visit: Payer: Self-pay | Admitting: Family Medicine

## 2020-05-08 DIAGNOSIS — M8580 Other specified disorders of bone density and structure, unspecified site: Secondary | ICD-10-CM

## 2020-05-09 DIAGNOSIS — R079 Chest pain, unspecified: Secondary | ICD-10-CM | POA: Diagnosis not present

## 2020-05-09 DIAGNOSIS — M7989 Other specified soft tissue disorders: Secondary | ICD-10-CM | POA: Diagnosis not present

## 2020-05-09 DIAGNOSIS — T8189XD Other complications of procedures, not elsewhere classified, subsequent encounter: Secondary | ICD-10-CM | POA: Diagnosis not present

## 2020-05-09 DIAGNOSIS — N179 Acute kidney failure, unspecified: Secondary | ICD-10-CM | POA: Diagnosis not present

## 2020-05-09 DIAGNOSIS — I480 Paroxysmal atrial fibrillation: Secondary | ICD-10-CM | POA: Diagnosis not present

## 2020-05-09 DIAGNOSIS — E782 Mixed hyperlipidemia: Secondary | ICD-10-CM | POA: Diagnosis not present

## 2020-05-09 DIAGNOSIS — I420 Dilated cardiomyopathy: Secondary | ICD-10-CM | POA: Diagnosis not present

## 2020-05-09 DIAGNOSIS — I10 Essential (primary) hypertension: Secondary | ICD-10-CM | POA: Diagnosis not present

## 2020-05-09 DIAGNOSIS — E43 Unspecified severe protein-calorie malnutrition: Secondary | ICD-10-CM | POA: Diagnosis not present

## 2020-05-12 DIAGNOSIS — R079 Chest pain, unspecified: Secondary | ICD-10-CM | POA: Diagnosis not present

## 2020-05-12 DIAGNOSIS — I480 Paroxysmal atrial fibrillation: Secondary | ICD-10-CM | POA: Diagnosis not present

## 2020-05-13 ENCOUNTER — Other Ambulatory Visit: Payer: Self-pay | Admitting: Family Medicine

## 2020-05-13 DIAGNOSIS — K219 Gastro-esophageal reflux disease without esophagitis: Secondary | ICD-10-CM

## 2020-05-15 DIAGNOSIS — E43 Unspecified severe protein-calorie malnutrition: Secondary | ICD-10-CM | POA: Diagnosis not present

## 2020-05-15 DIAGNOSIS — M7989 Other specified soft tissue disorders: Secondary | ICD-10-CM | POA: Diagnosis not present

## 2020-05-15 DIAGNOSIS — I420 Dilated cardiomyopathy: Secondary | ICD-10-CM | POA: Diagnosis not present

## 2020-05-15 DIAGNOSIS — N179 Acute kidney failure, unspecified: Secondary | ICD-10-CM | POA: Diagnosis not present

## 2020-05-15 DIAGNOSIS — T8189XD Other complications of procedures, not elsewhere classified, subsequent encounter: Secondary | ICD-10-CM | POA: Diagnosis not present

## 2020-05-15 DIAGNOSIS — I480 Paroxysmal atrial fibrillation: Secondary | ICD-10-CM | POA: Diagnosis not present

## 2020-05-16 ENCOUNTER — Ambulatory Visit (INDEPENDENT_AMBULATORY_CARE_PROVIDER_SITE_OTHER): Payer: Medicare Other | Admitting: Surgical

## 2020-05-16 DIAGNOSIS — S21002A Unspecified open wound of left breast, initial encounter: Secondary | ICD-10-CM

## 2020-05-16 DIAGNOSIS — Z9013 Acquired absence of bilateral breasts and nipples: Secondary | ICD-10-CM

## 2020-05-16 NOTE — Progress Notes (Signed)
No show

## 2020-05-18 DIAGNOSIS — E669 Obesity, unspecified: Secondary | ICD-10-CM | POA: Diagnosis not present

## 2020-05-18 DIAGNOSIS — Z7901 Long term (current) use of anticoagulants: Secondary | ICD-10-CM | POA: Diagnosis not present

## 2020-05-18 DIAGNOSIS — M4696 Unspecified inflammatory spondylopathy, lumbar region: Secondary | ICD-10-CM | POA: Diagnosis not present

## 2020-05-18 DIAGNOSIS — Z853 Personal history of malignant neoplasm of breast: Secondary | ICD-10-CM | POA: Diagnosis not present

## 2020-05-18 DIAGNOSIS — M7989 Other specified soft tissue disorders: Secondary | ICD-10-CM | POA: Diagnosis not present

## 2020-05-18 DIAGNOSIS — I10 Essential (primary) hypertension: Secondary | ICD-10-CM | POA: Diagnosis not present

## 2020-05-18 DIAGNOSIS — Z85828 Personal history of other malignant neoplasm of skin: Secondary | ICD-10-CM | POA: Diagnosis not present

## 2020-05-18 DIAGNOSIS — E43 Unspecified severe protein-calorie malnutrition: Secondary | ICD-10-CM | POA: Diagnosis not present

## 2020-05-18 DIAGNOSIS — Z9181 History of falling: Secondary | ICD-10-CM | POA: Diagnosis not present

## 2020-05-18 DIAGNOSIS — Z9013 Acquired absence of bilateral breasts and nipples: Secondary | ICD-10-CM | POA: Diagnosis not present

## 2020-05-18 DIAGNOSIS — I420 Dilated cardiomyopathy: Secondary | ICD-10-CM | POA: Diagnosis not present

## 2020-05-18 DIAGNOSIS — Z4801 Encounter for change or removal of surgical wound dressing: Secondary | ICD-10-CM | POA: Diagnosis not present

## 2020-05-18 DIAGNOSIS — D509 Iron deficiency anemia, unspecified: Secondary | ICD-10-CM | POA: Diagnosis not present

## 2020-05-18 DIAGNOSIS — T8189XD Other complications of procedures, not elsewhere classified, subsequent encounter: Secondary | ICD-10-CM | POA: Diagnosis not present

## 2020-05-18 DIAGNOSIS — N179 Acute kidney failure, unspecified: Secondary | ICD-10-CM | POA: Diagnosis not present

## 2020-05-18 DIAGNOSIS — Z6832 Body mass index (BMI) 32.0-32.9, adult: Secondary | ICD-10-CM | POA: Diagnosis not present

## 2020-05-18 DIAGNOSIS — I480 Paroxysmal atrial fibrillation: Secondary | ICD-10-CM | POA: Diagnosis not present

## 2020-05-18 DIAGNOSIS — R634 Abnormal weight loss: Secondary | ICD-10-CM | POA: Diagnosis not present

## 2020-05-26 DIAGNOSIS — N179 Acute kidney failure, unspecified: Secondary | ICD-10-CM | POA: Diagnosis not present

## 2020-05-26 DIAGNOSIS — M7989 Other specified soft tissue disorders: Secondary | ICD-10-CM | POA: Diagnosis not present

## 2020-05-26 DIAGNOSIS — E43 Unspecified severe protein-calorie malnutrition: Secondary | ICD-10-CM | POA: Diagnosis not present

## 2020-05-26 DIAGNOSIS — T8189XD Other complications of procedures, not elsewhere classified, subsequent encounter: Secondary | ICD-10-CM | POA: Diagnosis not present

## 2020-05-26 DIAGNOSIS — I420 Dilated cardiomyopathy: Secondary | ICD-10-CM | POA: Diagnosis not present

## 2020-05-26 DIAGNOSIS — I480 Paroxysmal atrial fibrillation: Secondary | ICD-10-CM | POA: Diagnosis not present

## 2020-05-29 DIAGNOSIS — I480 Paroxysmal atrial fibrillation: Secondary | ICD-10-CM | POA: Diagnosis not present

## 2020-05-29 DIAGNOSIS — R079 Chest pain, unspecified: Secondary | ICD-10-CM | POA: Diagnosis not present

## 2020-05-30 DIAGNOSIS — I10 Essential (primary) hypertension: Secondary | ICD-10-CM | POA: Diagnosis not present

## 2020-05-30 DIAGNOSIS — I480 Paroxysmal atrial fibrillation: Secondary | ICD-10-CM | POA: Diagnosis not present

## 2020-05-30 DIAGNOSIS — E782 Mixed hyperlipidemia: Secondary | ICD-10-CM | POA: Diagnosis not present

## 2020-06-01 DIAGNOSIS — M7989 Other specified soft tissue disorders: Secondary | ICD-10-CM | POA: Diagnosis not present

## 2020-06-01 DIAGNOSIS — N179 Acute kidney failure, unspecified: Secondary | ICD-10-CM | POA: Diagnosis not present

## 2020-06-01 DIAGNOSIS — I420 Dilated cardiomyopathy: Secondary | ICD-10-CM | POA: Diagnosis not present

## 2020-06-01 DIAGNOSIS — I480 Paroxysmal atrial fibrillation: Secondary | ICD-10-CM | POA: Diagnosis not present

## 2020-06-01 DIAGNOSIS — E43 Unspecified severe protein-calorie malnutrition: Secondary | ICD-10-CM | POA: Diagnosis not present

## 2020-06-01 DIAGNOSIS — T8189XD Other complications of procedures, not elsewhere classified, subsequent encounter: Secondary | ICD-10-CM | POA: Diagnosis not present

## 2020-06-08 DIAGNOSIS — T8189XD Other complications of procedures, not elsewhere classified, subsequent encounter: Secondary | ICD-10-CM | POA: Diagnosis not present

## 2020-06-08 DIAGNOSIS — M7989 Other specified soft tissue disorders: Secondary | ICD-10-CM | POA: Diagnosis not present

## 2020-06-08 DIAGNOSIS — I420 Dilated cardiomyopathy: Secondary | ICD-10-CM | POA: Diagnosis not present

## 2020-06-08 DIAGNOSIS — N179 Acute kidney failure, unspecified: Secondary | ICD-10-CM | POA: Diagnosis not present

## 2020-06-08 DIAGNOSIS — I480 Paroxysmal atrial fibrillation: Secondary | ICD-10-CM | POA: Diagnosis not present

## 2020-06-08 DIAGNOSIS — E43 Unspecified severe protein-calorie malnutrition: Secondary | ICD-10-CM | POA: Diagnosis not present

## 2020-06-09 ENCOUNTER — Encounter: Payer: Self-pay | Admitting: Oncology

## 2020-06-09 ENCOUNTER — Other Ambulatory Visit: Payer: Self-pay

## 2020-06-09 ENCOUNTER — Inpatient Hospital Stay: Payer: Medicare Other | Attending: Oncology | Admitting: Oncology

## 2020-06-09 VITALS — BP 119/65 | HR 53 | Temp 97.9°F | Resp 16

## 2020-06-09 DIAGNOSIS — Z86000 Personal history of in-situ neoplasm of breast: Secondary | ICD-10-CM | POA: Diagnosis not present

## 2020-06-09 DIAGNOSIS — D0511 Intraductal carcinoma in situ of right breast: Secondary | ICD-10-CM | POA: Diagnosis not present

## 2020-06-09 DIAGNOSIS — I480 Paroxysmal atrial fibrillation: Secondary | ICD-10-CM | POA: Diagnosis not present

## 2020-06-09 DIAGNOSIS — E43 Unspecified severe protein-calorie malnutrition: Secondary | ICD-10-CM | POA: Diagnosis not present

## 2020-06-09 DIAGNOSIS — Z789 Other specified health status: Secondary | ICD-10-CM | POA: Diagnosis not present

## 2020-06-09 DIAGNOSIS — N179 Acute kidney failure, unspecified: Secondary | ICD-10-CM | POA: Diagnosis not present

## 2020-06-09 DIAGNOSIS — Z9013 Acquired absence of bilateral breasts and nipples: Secondary | ICD-10-CM | POA: Diagnosis not present

## 2020-06-09 DIAGNOSIS — T8189XD Other complications of procedures, not elsewhere classified, subsequent encounter: Secondary | ICD-10-CM | POA: Diagnosis not present

## 2020-06-09 DIAGNOSIS — M7989 Other specified soft tissue disorders: Secondary | ICD-10-CM | POA: Diagnosis not present

## 2020-06-09 DIAGNOSIS — I420 Dilated cardiomyopathy: Secondary | ICD-10-CM | POA: Diagnosis not present

## 2020-06-09 NOTE — Progress Notes (Signed)
Hematology/Oncology Consult note [facility]  Telephone:([phone] [phone]  Patient Care Team: Juline Patch, MD as PCP - General (Family Medicine)   Name of the patient: Melissa Chase  [id number]  [birth year]   Date of visit: 06/09/20  Diagnosis-history of bilateral DCIS s/p bilateral mastectomy  Chief complaint/ Reason for visit-routine follow-up of bilateral DCIS  Heme/Onc history: Patient is a 80 year old female with a history of left breast DCIS in 2006. At that time she underwent a lumpectomy followed by radiation treatment and was on tamoxifen for 5 years. Subsequently she has been undergoing regular screening mammograms. She self palpated a mass in the right subareolar region. This was followed by a mammogram and ultrasound in May 2019 which revealed a solid 1.1 x 0.9 x 0.5 cm right breast mass. No evidence of right axillary adenopathy. 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 Consideration includes DCIS involving a papilloma and encapsulated papillary carcinoma. Patient underwent lumpectomy on 07/09/2017 by Dr. Peyton Najjar which showed a 12 mm grade 1-2 DCIS with involvement of pre-existing papilloma and adjacent ducts. Lateral excision revealed atypical ductal hyperplasia. Margins were negative. Tumor was greater than ER 90% positive and greater than 90% PR positive. Pathologic stage Tis NX. Patientcompleted adjuvant RT and was started on arimidex. She could not tolerate arimidex and was switched to tamoxifen   Mammogram in September 2021 showed suspicious group of calcifications in the left breast which was biopsy-proven DCIS and patient decided to go through a bilateral mastectomy at this time.  Mastectomy specimen showed mild residual DCIS and grade 2 breasts with negative margins.  7 lymph nodes negative for malignancy.  No evidence of invasive mammary carcinoma in either breast.  Patient  had a nonhealing mastectomy flap and had to undergo residual flap excision which showed neutrophilic Inflammation  Interval history-left breast mastectomy wound has still not healed well and she follows up with wound care for this.  Overall patient feels well and reports that her appetite is getting better.  She did lose significant weight since her mastectomy surgery.  ECOG PS- 1 Pain scale- 0   Review of systems- Review of Systems  Constitutional: Positive for malaise/fatigue and weight loss. Negative for chills and fever.  HENT: Negative for congestion, ear discharge and nosebleeds.   Eyes: Negative for blurred vision.  Respiratory: Negative for cough, hemoptysis, sputum production, shortness of breath and wheezing.   Cardiovascular: Negative for chest pain, palpitations, orthopnea and claudication.  Gastrointestinal: Negative for abdominal pain, blood in stool, constipation, diarrhea, heartburn, melena, nausea and vomiting.  Genitourinary: Negative for dysuria, flank pain, frequency, hematuria and urgency.  Musculoskeletal: Negative for back pain, joint pain and myalgias.  Skin: Negative for rash.  Neurological: Negative for dizziness, tingling, focal weakness, seizures, weakness and headaches.  Endo/Heme/Allergies: Does not bruise/bleed easily.  Psychiatric/Behavioral: Negative for depression and suicidal ideas. The patient does not have insomnia.       No Known Allergies   Past Medical History:  Diagnosis Date  . Afib ([place])   . Arthritis   . Breast cancer ([place]) 2006   left breast  . Breast cancer ([place]) 2019   right breast  . Cardiomyopathy ([place])   . Chronic constipation   . Depression   . GERD (gastroesophageal reflux disease)   . Hypertension   . Insomnia   . Insomnia   . Moderate mitral regurgitation   . Moderate tricuspid regurgitation   . Personal history of radiation therapy 2006  36 tx.  . Personal history of radiation therapy 2019     Past Surgical  History:  Procedure Laterality Date  . ABDOMINAL HYSTERECTOMY    . APPLICATION OF WOUND VAC  11/01/2019   Procedure: APPLICATION OF WOUND VAC;  Surgeon: Herbert Pun, MD;  Location: [facility];  Service: General;;  . 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 BREAST BIOPSY Left 10/12/2019   affirm bx of calcs, x marker, path pending  . 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 BREAST LUMPECTOMY Right 07/09/2017   DCIS, clear margins  . BREAST LUMPECTOMY Left 2006   DCIS  . BREAST LUMPECTOMY WITH NEEDLE LOCALIZATION Right 07/09/2017   Procedure: BREAST LUMPECTOMY WITH NEEDLE LOCALIZATION;  Surgeon: Herbert Pun, MD;  Location: [facility];  Service: General;  Laterality: Right;  . BREAST RECONSTRUCTION WITH PLACEMENT OF TISSUE EXPANDER AND FLEX HD (ACELLULAR HYDRATED DERMIS) Bilateral 11/01/2019   Procedure: BREAST RECONSTRUCTION WITH PLACEMENT OF TISSUE EXPANDER AND FLEX HD (ACELLULAR HYDRATED DERMIS);  Surgeon: Cindra Presume, MD;  Location: [facility];  Service: Plastics;  Laterality: Bilateral;  . BREAST SURGERY     left breast lumpectomy  . CARDIOVERSION N/A 03/21/2016   Procedure: Cardioversion;  Surgeon: Corey Skains, MD;  Location: [facility];  Service: Cardiovascular;  Laterality: N/A;  . CATARACT EXTRACTION, BILATERAL    . DEBRIDEMENT AND CLOSURE WOUND Bilateral 11/26/2019   Procedure: Excision of bilateral mastectomy flap necrosis with complex closure;  Surgeon: Cindra Presume, MD;  Location: [place];  Service: Plastics;  Laterality: Bilateral;  1.5 hours total  . LAPAROSCOPIC CHOLECYSTECTOMY    . TISSUE EXPANDER PLACEMENT Bilateral 11/26/2019   Procedure: exchange of tissue expanders;  Surgeon: Cindra Presume, MD;  Location: [place];  Service: Plastics;  Laterality: Bilateral;  . TISSUE EXPANDER PLACEMENT Bilateral 02/01/2020   Procedure: removal of bilateral expander with closure;  Surgeon: Cindra Presume, MD;  Location: [place];  Service: Plastics;  Laterality: Bilateral;  . TONSILLECTOMY    . TOTAL MASTECTOMY Bilateral 11/01/2019   Procedure: TOTAL MASTECTOMY w/ immediate reconstruction and Sentinel Node Biopsy;  Surgeon: Herbert Pun, MD;  Location: [facility];  Service: General;  Laterality: Bilateral;  . VAGINAL HYSTERECTOMY      Social History   Socioeconomic History  . Marital status: Widowed    Spouse name: Not on file  . Number of children: 3  . Years of education: Not on file  . Highest education level: Not on file  Occupational History  . Not on file  Tobacco Use  . Smoking status: Never Smoker  . Smokeless tobacco: Never Used  Vaping Use  . Vaping Use: Never used  Substance and Sexual Activity  . Alcohol use: Never  . Drug use: No  . Sexual activity: Not Currently  Other Topics Concern  . Not on file  Social History Narrative  . Not on file   Social Determinants of Health   Financial Resource Strain: Low Risk   . Difficulty of Paying Living Expenses: Not hard at all  Food Insecurity: No Food Insecurity  . Worried About Charity fundraiser in the Last Year: Never true  . Ran Out of Food in the Last Year: Never true  Transportation Needs: No Transportation Needs  . Lack of Transportation (Medical): No  . Lack of Transportation (Non-Medical): No  Physical Activity: Inactive  . Days of Exercise per Week: 0  days  . Minutes of Exercise per Session: 0 min  Stress: No Stress Concern Present  . Feeling of Stress : Only a little  Social Connections: Socially Isolated  . Frequency of Communication with Friends and Family: More than three times a week  . Frequency of Social Gatherings with Friends and Family: More than three times a week  . Attends Religious Services: Never  . Active Member of Clubs or Organizations: No  . Attends Archivist Meetings: Never  . Marital Status: Widowed  Intimate Partner Violence: Not At Risk  . Fear of  Current or Ex-Partner: No  . Emotionally Abused: No  . Physically Abused: No  . Sexually Abused: No    Family History  Problem Relation Age of Onset  . Diabetes Sister   . Breast cancer Daughter        44's     Current Outpatient Medications:  .  alendronate (FOSAMAX) 70 MG tablet, TAKE 1 TABLET BY MOUTH ONCE A WEEK. TAKE WITH A FULL GLASS OF WATER ON AN EMPTY STOMACH., Disp: 12 tablet, Rfl: 0 .  apixaban (ELIQUIS) 5 MG TABS tablet, Take 5 mg by mouth 2 (two) times daily. , Disp: , Rfl:  .  calcium-vitamin D (OSCAL WITH D) 500-200 MG-UNIT TABS tablet, Take 1 tablet by mouth 2 (two) times daily. , Disp: , Rfl:  .  celecoxib (CELEBREX) 100 MG capsule, Take 1 capsule (100 mg total) by mouth daily., Disp: 30 capsule, Rfl: 1 .  flecainide (TAMBOCOR) 50 MG tablet, Take 50 mg by mouth 2 (two) times daily., Disp: , Rfl:  .  metoprolol tartrate (LOPRESSOR) 50 MG tablet, Take 1 tablet by mouth in the morning and at bedtime. kowalski, Disp: , Rfl:  .  pravastatin (PRAVACHOL) 20 MG tablet, Take 1 tablet (20 mg total) by mouth daily., Disp: 90 tablet, Rfl: 1 .  sertraline (ZOLOFT) 100 MG tablet, Take 1 tablet (100 mg total) by mouth daily., Disp: 90 tablet, Rfl: 1  Physical exam:  Vitals:   06/09/20 1315  BP: 119/65  Pulse: (!) 53  Resp: 16  Temp: 97.9 F (36.6 C)  TempSrc: Oral   Physical Exam Constitutional:      General: She is not in acute distress. Cardiovascular:     Rate and Rhythm: Normal rate and regular rhythm.     Heart sounds: Normal heart sounds.  Pulmonary:     Effort: Pulmonary effort is normal.     Breath sounds: Normal breath sounds.  Abdominal:     General: Bowel sounds are normal.     Palpations: Abdomen is soft.  Skin:    General: Skin is warm and dry.  Neurological:     Mental Status: She is alert and oriented to person, place, and time.   Breast exam: Patient is s/p bilateral mastectomy without reconstruction.  Right mastectomy wound has healed well with no  evidence of infection.  There is still early near open area in the left breast.  There is an overlying dressing in place.  Does not appear to be infected  CMP Latest Ref Rng & Units 04/26/2020  Glucose 65 - 99 mg/dL 92  BUN 8 - 27 mg/dL 17  Creatinine 0.57 - 1.00 mg/dL 0.96  Sodium 134 - 144 mmol/L 146(H)  Potassium 3.5 - 5.2 mmol/L 4.7  Chloride 96 - 106 mmol/L 108(H)  CO2 20 - 29 mmol/L 22  Calcium 8.7 - 10.3 mg/dL 9.1  Total Protein 6.0 - 8.5 g/dL 6.3  Total Bilirubin 0.0 - 1.2 mg/dL 0.8  Alkaline Phos 44 - 121 IU/L 44  AST 0 - 40 IU/L 23  ALT 0 - 32 IU/L 8   CBC Latest Ref Rng & Units 03/03/2020  WBC 3.4 - 10.8 x10E3/uL 6.4  Hemoglobin 11.1 - 15.9 g/dL 11.2  Hematocrit 34.0 - 46.6 % 36.0  Platelets 150 - 450 x10E3/uL 177     Assessment and plan- Patient is a 80 y.o. female with history of bilateral DCIS here for routine follow-up  Patient had history of left breast DCIS and then had right breast DCIS for which she was on tamoxifen.  While on tamoxifen she was found to have a new left breast DCIS and patient elected to go for a bilateral mastectomy.  He therefore does not require any hormone therapy at this time.  Clinically patient is doing well with no concerning signs and symptoms of recurrence based on today's exam.  He is still taking a long time to heal from her left mastectomy wound and follows up with wound care.  I will see her back in 1 year no labs   Visit Diagnosis 1. Ductal carcinoma in situ (DCIS) of right breast      Dr. Randa Evens, MD, MPH [facility] [phone] 06/09/2020 3:55 PM

## 2020-06-09 NOTE — Progress Notes (Signed)
Pt still has dressing on left breast bone. She is getting better but still has fatigue but getting better.

## 2020-06-12 ENCOUNTER — Other Ambulatory Visit: Payer: Self-pay

## 2020-06-12 ENCOUNTER — Ambulatory Visit (INDEPENDENT_AMBULATORY_CARE_PROVIDER_SITE_OTHER): Payer: Medicare Other | Admitting: Plastic Surgery

## 2020-06-12 DIAGNOSIS — S21002A Unspecified open wound of left breast, initial encounter: Secondary | ICD-10-CM | POA: Diagnosis not present

## 2020-06-12 NOTE — Progress Notes (Signed)
   Referring Provider Juline Patch, MD [address] [place],  [place] [address]   CC:  Chief Complaint  Patient presents with  . Follow-up      Melissa Chase is an 80 y.o. female.  HPI: Patient presents for follow-up regarding her left chest wound.  She initially underwent bilateral mastectomies and we attempted tissue expander based reconstruction.  She had persistent wound healing issues on the left side in particular that ultimately required removal of both expanders.  She had been radiated on the left and developed some skin breakdown even after the expanders were removed that has been being treated with local wound care.  Additionally after that round of surgeries she physically deteriorated to some degree regarding her functional status but she feels like she is doing much better at this point and is now driving independently and feels good.  Review of Systems General: Denies fevers or chills  Physical Exam Vitals with BMI 06/09/2020 04/26/2020 04/04/2020  Height - 5\' 5"  5\' 5"   Weight - 195 lbs 193 lbs  BMI - 71.69 67.89  Systolic 381 017 510  Diastolic 65 80 80  Pulse 53 64 68    General:  No acute distress,  Alert and oriented, Non-Toxic, Normal speech and affect Examination of the left chest shows a gradually healing wound.  There is a little bit of tunneling particularly laterally.  Everything looks healthy and I do not see any signs of purulence.  The right side looks to be healed well.  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  At the moment were planning to manage this conservatively given the impact that general anesthesia seems to have on her functional status.  She is content with that and will continue the home health visits which are being done once a week.  I will plan to see her again in another 2 months or so to reevaluate.  She knows to call with any questions or concerns in the meantime.  All of her  questions were answered today.  Cindra Presume 06/12/2020, 12:10 PM

## 2020-06-14 DIAGNOSIS — T8189XD Other complications of procedures, not elsewhere classified, subsequent encounter: Secondary | ICD-10-CM | POA: Diagnosis not present

## 2020-06-14 DIAGNOSIS — E43 Unspecified severe protein-calorie malnutrition: Secondary | ICD-10-CM | POA: Diagnosis not present

## 2020-06-14 DIAGNOSIS — N179 Acute kidney failure, unspecified: Secondary | ICD-10-CM | POA: Diagnosis not present

## 2020-06-14 DIAGNOSIS — I480 Paroxysmal atrial fibrillation: Secondary | ICD-10-CM | POA: Diagnosis not present

## 2020-06-14 DIAGNOSIS — I420 Dilated cardiomyopathy: Secondary | ICD-10-CM | POA: Diagnosis not present

## 2020-06-14 DIAGNOSIS — M7989 Other specified soft tissue disorders: Secondary | ICD-10-CM | POA: Diagnosis not present

## 2020-06-17 DIAGNOSIS — N179 Acute kidney failure, unspecified: Secondary | ICD-10-CM | POA: Diagnosis not present

## 2020-06-17 DIAGNOSIS — R634 Abnormal weight loss: Secondary | ICD-10-CM | POA: Diagnosis not present

## 2020-06-17 DIAGNOSIS — I420 Dilated cardiomyopathy: Secondary | ICD-10-CM | POA: Diagnosis not present

## 2020-06-17 DIAGNOSIS — Z853 Personal history of malignant neoplasm of breast: Secondary | ICD-10-CM | POA: Diagnosis not present

## 2020-06-17 DIAGNOSIS — D509 Iron deficiency anemia, unspecified: Secondary | ICD-10-CM | POA: Diagnosis not present

## 2020-06-17 DIAGNOSIS — T8189XD Other complications of procedures, not elsewhere classified, subsequent encounter: Secondary | ICD-10-CM | POA: Diagnosis not present

## 2020-06-17 DIAGNOSIS — I10 Essential (primary) hypertension: Secondary | ICD-10-CM | POA: Diagnosis not present

## 2020-06-17 DIAGNOSIS — Z85828 Personal history of other malignant neoplasm of skin: Secondary | ICD-10-CM | POA: Diagnosis not present

## 2020-06-17 DIAGNOSIS — I480 Paroxysmal atrial fibrillation: Secondary | ICD-10-CM | POA: Diagnosis not present

## 2020-06-17 DIAGNOSIS — Z7901 Long term (current) use of anticoagulants: Secondary | ICD-10-CM | POA: Diagnosis not present

## 2020-06-17 DIAGNOSIS — Z9013 Acquired absence of bilateral breasts and nipples: Secondary | ICD-10-CM | POA: Diagnosis not present

## 2020-06-17 DIAGNOSIS — M4696 Unspecified inflammatory spondylopathy, lumbar region: Secondary | ICD-10-CM | POA: Diagnosis not present

## 2020-06-17 DIAGNOSIS — E669 Obesity, unspecified: Secondary | ICD-10-CM | POA: Diagnosis not present

## 2020-06-17 DIAGNOSIS — E43 Unspecified severe protein-calorie malnutrition: Secondary | ICD-10-CM | POA: Diagnosis not present

## 2020-06-17 DIAGNOSIS — M7989 Other specified soft tissue disorders: Secondary | ICD-10-CM | POA: Diagnosis not present

## 2020-06-17 DIAGNOSIS — Z6832 Body mass index (BMI) 32.0-32.9, adult: Secondary | ICD-10-CM | POA: Diagnosis not present

## 2020-06-17 DIAGNOSIS — Z9181 History of falling: Secondary | ICD-10-CM | POA: Diagnosis not present

## 2020-06-22 DIAGNOSIS — I420 Dilated cardiomyopathy: Secondary | ICD-10-CM | POA: Diagnosis not present

## 2020-06-22 DIAGNOSIS — N179 Acute kidney failure, unspecified: Secondary | ICD-10-CM | POA: Diagnosis not present

## 2020-06-22 DIAGNOSIS — I480 Paroxysmal atrial fibrillation: Secondary | ICD-10-CM | POA: Diagnosis not present

## 2020-06-22 DIAGNOSIS — E43 Unspecified severe protein-calorie malnutrition: Secondary | ICD-10-CM | POA: Diagnosis not present

## 2020-06-22 DIAGNOSIS — T8189XD Other complications of procedures, not elsewhere classified, subsequent encounter: Secondary | ICD-10-CM | POA: Diagnosis not present

## 2020-06-22 DIAGNOSIS — M7989 Other specified soft tissue disorders: Secondary | ICD-10-CM | POA: Diagnosis not present

## 2020-06-29 DIAGNOSIS — I420 Dilated cardiomyopathy: Secondary | ICD-10-CM | POA: Diagnosis not present

## 2020-06-29 DIAGNOSIS — I480 Paroxysmal atrial fibrillation: Secondary | ICD-10-CM | POA: Diagnosis not present

## 2020-06-29 DIAGNOSIS — E43 Unspecified severe protein-calorie malnutrition: Secondary | ICD-10-CM | POA: Diagnosis not present

## 2020-06-29 DIAGNOSIS — M7989 Other specified soft tissue disorders: Secondary | ICD-10-CM | POA: Diagnosis not present

## 2020-06-29 DIAGNOSIS — N179 Acute kidney failure, unspecified: Secondary | ICD-10-CM | POA: Diagnosis not present

## 2020-06-29 DIAGNOSIS — T8189XD Other complications of procedures, not elsewhere classified, subsequent encounter: Secondary | ICD-10-CM | POA: Diagnosis not present

## 2020-07-03 ENCOUNTER — Telehealth: Payer: Self-pay | Admitting: Surgical

## 2020-07-03 NOTE — Telephone Encounter (Signed)
Amada Jupiter from [facility] called regarding Melissa Chase. Per Adonis Huguenin, her wound is healing very slowly. Excess moisture is trapped under gauze due to gel. Their wound care specialist is suggesting using Hydorfera Blue 2x a week

## 2020-07-06 ENCOUNTER — Telehealth: Payer: Self-pay

## 2020-07-06 DIAGNOSIS — I480 Paroxysmal atrial fibrillation: Secondary | ICD-10-CM | POA: Diagnosis not present

## 2020-07-06 DIAGNOSIS — T8189XD Other complications of procedures, not elsewhere classified, subsequent encounter: Secondary | ICD-10-CM | POA: Diagnosis not present

## 2020-07-06 DIAGNOSIS — E43 Unspecified severe protein-calorie malnutrition: Secondary | ICD-10-CM | POA: Diagnosis not present

## 2020-07-06 DIAGNOSIS — I420 Dilated cardiomyopathy: Secondary | ICD-10-CM | POA: Diagnosis not present

## 2020-07-06 DIAGNOSIS — M7989 Other specified soft tissue disorders: Secondary | ICD-10-CM | POA: Diagnosis not present

## 2020-07-06 DIAGNOSIS — N179 Acute kidney failure, unspecified: Secondary | ICD-10-CM | POA: Diagnosis not present

## 2020-07-06 NOTE — Telephone Encounter (Signed)
Called and [facility] @ 1:27pm) for Melissa Chase with [place] to return call regarding the message below.//AB/CMA

## 2020-07-06 NOTE — Telephone Encounter (Signed)
Amada Jupiter called from [facility] to say that Mammie Lorenzo, Certified Wound Ostomy Nurse at [facility], reviewed the patient's photos and her measurements.  Adonis Huguenin said that the left breast wound is staying very moist and is sort of at a halt in healing.  She would like for Dr. Claudia Desanctis to consider changing her dressing to hydrofera blue, cut to fit the wound using 4x4 or ABD tape to secure and changing it two times per week (cleaning wound with wound cleanser).  Please call.

## 2020-07-06 NOTE — Telephone Encounter (Signed)
Called and spoke with Melissa Chase with [facility] and informed her that I spoke with Dr. Claudia Desanctis regarding her message below.  Per Dr. Tommie Ard to change the patient's dressing to Hydrofera blue, cut to fit the wound using 4x4 or ABD tape to secure and changing it two times per week (cleaning wound with wound cleanser).  Melissa Chase verbalized understanding and agreed.  She stated that she can start the new dressing changes next week, or she can go back to the patient's house tomorrow and start the new dressing changes.    She asked when would Dr. Claudia Desanctis like for her to start the new dressing changes.  Informed her that I spoke with Dr. Claudia Desanctis and he stated that it was up to the patient.  Melissa Chase stated that she will give the patient a call and ask her when would she like to start the new dressing changes.//AB/CMA

## 2020-07-07 DIAGNOSIS — E43 Unspecified severe protein-calorie malnutrition: Secondary | ICD-10-CM | POA: Diagnosis not present

## 2020-07-07 DIAGNOSIS — M7989 Other specified soft tissue disorders: Secondary | ICD-10-CM | POA: Diagnosis not present

## 2020-07-07 DIAGNOSIS — I420 Dilated cardiomyopathy: Secondary | ICD-10-CM | POA: Diagnosis not present

## 2020-07-07 DIAGNOSIS — T8189XD Other complications of procedures, not elsewhere classified, subsequent encounter: Secondary | ICD-10-CM | POA: Diagnosis not present

## 2020-07-07 DIAGNOSIS — I480 Paroxysmal atrial fibrillation: Secondary | ICD-10-CM | POA: Diagnosis not present

## 2020-07-07 DIAGNOSIS — N179 Acute kidney failure, unspecified: Secondary | ICD-10-CM | POA: Diagnosis not present

## 2020-07-10 DIAGNOSIS — I420 Dilated cardiomyopathy: Secondary | ICD-10-CM | POA: Diagnosis not present

## 2020-07-10 DIAGNOSIS — I480 Paroxysmal atrial fibrillation: Secondary | ICD-10-CM | POA: Diagnosis not present

## 2020-07-10 DIAGNOSIS — E43 Unspecified severe protein-calorie malnutrition: Secondary | ICD-10-CM | POA: Diagnosis not present

## 2020-07-10 DIAGNOSIS — N179 Acute kidney failure, unspecified: Secondary | ICD-10-CM | POA: Diagnosis not present

## 2020-07-10 DIAGNOSIS — T8189XD Other complications of procedures, not elsewhere classified, subsequent encounter: Secondary | ICD-10-CM | POA: Diagnosis not present

## 2020-07-10 DIAGNOSIS — M7989 Other specified soft tissue disorders: Secondary | ICD-10-CM | POA: Diagnosis not present

## 2020-07-13 DIAGNOSIS — M7989 Other specified soft tissue disorders: Secondary | ICD-10-CM | POA: Diagnosis not present

## 2020-07-13 DIAGNOSIS — I420 Dilated cardiomyopathy: Secondary | ICD-10-CM | POA: Diagnosis not present

## 2020-07-13 DIAGNOSIS — N179 Acute kidney failure, unspecified: Secondary | ICD-10-CM | POA: Diagnosis not present

## 2020-07-13 DIAGNOSIS — T8189XD Other complications of procedures, not elsewhere classified, subsequent encounter: Secondary | ICD-10-CM | POA: Diagnosis not present

## 2020-07-13 DIAGNOSIS — I480 Paroxysmal atrial fibrillation: Secondary | ICD-10-CM | POA: Diagnosis not present

## 2020-07-13 DIAGNOSIS — E43 Unspecified severe protein-calorie malnutrition: Secondary | ICD-10-CM | POA: Diagnosis not present

## 2020-07-14 ENCOUNTER — Encounter: Payer: Self-pay | Admitting: Family Medicine

## 2020-07-14 ENCOUNTER — Telehealth: Payer: Self-pay

## 2020-07-14 ENCOUNTER — Ambulatory Visit (INDEPENDENT_AMBULATORY_CARE_PROVIDER_SITE_OTHER): Payer: Medicare Other | Admitting: Family Medicine

## 2020-07-14 ENCOUNTER — Other Ambulatory Visit: Payer: Self-pay

## 2020-07-14 VITALS — BP 110/62 | HR 60 | Ht 65.0 in | Wt 192.0 lb

## 2020-07-14 DIAGNOSIS — L659 Nonscarring hair loss, unspecified: Secondary | ICD-10-CM | POA: Diagnosis not present

## 2020-07-14 NOTE — Progress Notes (Signed)
Air loss   Date:  07/14/2020   Name:  Melissa Chase   DOB:  06/23/1940   MRN:  [id number]   Chief Complaint: Alopecia (Noticed it getting really bad in the past month or so.) and Nail Problem (Has noticed nail beds are white at the base and pink at the top)  Patient is a 80 year old female who presents for a comprehensive physical exam. The patient reports the following problems: hair loss. Health maintenance has been reviewed up to date.     Lab Results  Component Value Date   CREATININE 0.96 04/26/2020   BUN 17 04/26/2020   NA 146 (H) 04/26/2020   K 4.7 04/26/2020   CL 108 (H) 04/26/2020   CO2 22 04/26/2020   Lab Results  Component Value Date   CHOL 182 04/26/2020   HDL 53 04/26/2020   LDLCALC 105 (H) 04/26/2020   TRIG 138 04/26/2020   CHOLHDL 2.3 05/26/2019   Lab Results  Component Value Date   TSH 2.210 09/03/2018   Lab Results  Component Value Date   HGBA1C 5.4 01/01/2013   Lab Results  Component Value Date   WBC 6.4 03/03/2020   HGB 11.2 03/03/2020   HCT 36.0 03/03/2020   MCV 95 03/03/2020   PLT 177 03/03/2020   Lab Results  Component Value Date   ALT 8 04/26/2020   AST 23 04/26/2020   ALKPHOS 44 04/26/2020   BILITOT 0.8 04/26/2020     Review of Systems  Patient Active Problem List   Diagnosis Date Noted   S/P mastectomy, bilateral 11/15/2019   Breast cancer ([place]) 11/01/2019   Obesity (BMI 30-39.9) 10/26/2018   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   Goals of care, counseling/discussion 09/18/2017   Sacroiliitis ([place]) 02/04/2017   Dilated cardiomyopathy ([place]) 05/02/2016   Bradycardia 04/04/2016   A-fib ([place]) 03/20/2016   Acute dyspnea 02/20/2016   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   Arthritis 06/06/2014   Aggrieved 06/06/2014   Dermatitis, eczematoid 06/06/2014   Recurrent major depressive episodes ([place]) 06/06/2014   Essential (primary) hypertension  06/06/2014   Gastro-esophageal reflux disease without esophagitis 06/06/2014   Cannot sleep 06/06/2014   Menopause 06/06/2014    No Known Allergies  Past Surgical History:  Procedure Laterality Date   ABDOMINAL HYSTERECTOMY     APPLICATION OF WOUND VAC  11/01/2019   Procedure: APPLICATION OF WOUND VAC;  Surgeon: Herbert Pun, MD;  Location: [facility];  Service: General;;   BREAST BIOPSY Right 06/25/2017   ductal carcinoma in situ involving a papilloma and encapsulated papillary carcinoma   BREAST BIOPSY Left 10/12/2019   affirm bx of calcs, x marker, path pending   BREAST BIOPSY Left 2006   DCIS   BREAST LUMPECTOMY Right 07/09/2017   DCIS, clear margins   BREAST LUMPECTOMY Left 2006   DCIS   BREAST LUMPECTOMY WITH NEEDLE LOCALIZATION Right 07/09/2017   Procedure: BREAST LUMPECTOMY WITH NEEDLE LOCALIZATION;  Surgeon: Herbert Pun, MD;  Location: [facility];  Service: General;  Laterality: Right;   BREAST RECONSTRUCTION WITH PLACEMENT OF TISSUE EXPANDER AND FLEX HD (ACELLULAR HYDRATED DERMIS) Bilateral 11/01/2019   Procedure: BREAST RECONSTRUCTION WITH PLACEMENT OF TISSUE EXPANDER AND FLEX HD (ACELLULAR HYDRATED DERMIS);  Surgeon: Cindra Presume, MD;  Location: [facility];  Service: Plastics;  Laterality: Bilateral;   BREAST SURGERY     left breast lumpectomy   CARDIOVERSION N/A 03/21/2016   Procedure: Cardioversion;  Surgeon:  Corey Skains, MD;  Location: [facility];  Service: Cardiovascular;  Laterality: N/A;   CATARACT EXTRACTION, BILATERAL     DEBRIDEMENT AND CLOSURE WOUND Bilateral 11/26/2019   Procedure: Excision of bilateral mastectomy flap necrosis with complex closure;  Surgeon: Cindra Presume, MD;  Location: [place];  Service: Plastics;  Laterality: Bilateral;  1.5 hours total   LAPAROSCOPIC CHOLECYSTECTOMY     TISSUE EXPANDER PLACEMENT Bilateral 11/26/2019   Procedure: exchange of tissue expanders;  Surgeon: Cindra Presume, MD;  Location: [place];  Service:  Plastics;  Laterality: Bilateral;   TISSUE EXPANDER PLACEMENT Bilateral 02/01/2020   Procedure: removal of bilateral expander with closure;  Surgeon: Cindra Presume, MD;  Location: [place];  Service: Plastics;  Laterality: Bilateral;   TONSILLECTOMY     TOTAL MASTECTOMY Bilateral 11/01/2019   Procedure: TOTAL MASTECTOMY w/ immediate reconstruction and Sentinel Node Biopsy;  Surgeon: Herbert Pun, MD;  Location: [facility];  Service: General;  Laterality: Bilateral;   VAGINAL HYSTERECTOMY      Social History   Tobacco Use   Smoking status: Never   Smokeless tobacco: Never  Vaping Use   Vaping Use: Never used  Substance Use Topics   Alcohol use: Never   Drug use: No     Medication list has been reviewed and updated.  Current Meds  Medication Sig   apixaban (ELIQUIS) 5 MG TABS tablet Take 5 mg by mouth 2 (two) times daily.    calcium-vitamin D (OSCAL WITH D) 500-200 MG-UNIT TABS tablet Take 1 tablet by mouth 2 (two) times daily.    celecoxib (CELEBREX) 100 MG capsule Take 1 capsule (100 mg total) by mouth daily.   flecainide (TAMBOCOR) 50 MG tablet Take 50 mg by mouth 2 (two) times daily.   metoprolol tartrate (LOPRESSOR) 50 MG tablet Take 1 tablet by mouth in the morning and at bedtime. k[place]   pravastatin (PRAVACHOL) 20 MG tablet Take 1 tablet (20 mg total) by mouth daily.   sertraline (ZOLOFT) 100 MG tablet Take 1 tablet (100 mg total) by mouth daily.    PHQ 2/9 Scores 07/14/2020 04/26/2020 03/03/2020 12/13/2019  PHQ - 2 Score 1 0 0 0  PHQ- 9 Score 3 0 7 0    GAD 7 : Generalized Anxiety Score 07/14/2020 04/26/2020 03/03/2020 10/27/2019  Nervous, Anxious, on Edge 0 0 1 0  Control/stop worrying 0 1 2 0  Worry too much - different things 0 0 1 0  Trouble relaxing 0 0 0 0  Restless 0 0 0 0  Easily annoyed or irritable 0 0 2 0  Afraid - awful might happen 0 0 1 0  Total GAD 7 Score 0 1 7 0  Anxiety Difficulty - Not difficult at all Not difficult at all -    BP Readings  from Last 3 Encounters:  07/14/20 110/62  06/09/20 119/65  04/26/20 110/80    Physical Exam Vitals and nursing note reviewed.  Constitutional:      General: She is not in acute distress.    Appearance: She is not diaphoretic.  HENT:     Head: Normocephalic and atraumatic.     Right Ear: Tympanic membrane and external ear normal. There is no impacted cerumen.     Left Ear: Tympanic membrane and external ear normal. There is no impacted cerumen.     Nose: Nose normal. No congestion or rhinorrhea.     Mouth/Throat:     Mouth: Mucous membranes are moist.  Eyes:  General:        Right eye: No discharge.        Left eye: No discharge.     Conjunctiva/sclera: Conjunctivae normal.     Pupils: Pupils are equal, round, and reactive to light.  Neck:     Thyroid: No thyromegaly.     Vascular: No JVD.  Cardiovascular:     Rate and Rhythm: Normal rate and regular rhythm.     Heart sounds: Normal heart sounds. No murmur heard.   No friction rub. No gallop.  Pulmonary:     Effort: Pulmonary effort is normal.     Breath sounds: Normal breath sounds. No wheezing, rhonchi or rales.  Chest:     Chest wall: No tenderness.  Abdominal:     General: Bowel sounds are normal.     Palpations: Abdomen is soft. There is no mass.     Tenderness: There is no abdominal tenderness. There is no guarding.  Musculoskeletal:        General: Normal range of motion.     Cervical back: Normal range of motion and neck supple.  Lymphadenopathy:     Cervical: No cervical adenopathy.  Skin:    General: Skin is warm and dry.  Neurological:     Mental Status: She is alert.     Deep Tendon Reflexes: Reflexes are normal and symmetric.    Wt Readings from Last 3 Encounters:  07/14/20 192 lb (87.1 kg)  04/26/20 195 lb (88.5 kg)  04/04/20 193 lb (87.5 kg)    BP 110/62   Pulse 60   Ht 5\' 5"  (1.651 m)   Wt 192 lb (87.1 kg)   BMI 31.95 kg/m   Assessment and Plan:  1. Hair loss New onset.   Persistent.  Uncontrolled.  Complicated.  Patient's been under a lot of stress with her breast cancer and postsurgical circumstances.  She also was admitted for sepsis and February.  There are several reasons that could explain her sudden loss of hair including and not necessarily in the order of a current 1 she was recently been put on Tambocor for her heart and there is a side effect possibility of hair loss associated with this and information has been given.  2.  Other there is no other symptoms of hypothyroidism we we will check a TSH with thyroid panel to evaluate for this. 3.  Telogen effluvium is a possibility and that she had this episode of sepsis but there has not been any return of hair and I am concerned that this is an unlikely and the timing is not quite there as well.  4.  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 - Thyroid Panel With TSH - CBC with Differential/Platelet - Ambulatory referral to Dermatology

## 2020-07-14 NOTE — Patient Instructions (Signed)
Alopecia Areata, Adult ?Alopecia areata is a condition that causes hair loss. A person with this condition may lose hair on the scalp in patches. In some cases, a person may lose all the hair on the scalp or all the hair from the face and body. Having this condition can be emotionally difficult, but it is not dangerous. ?Alopecia areata is an autoimmune disease. This means that your body's defense system (immune system) mistakes normal parts of the body for germs or other things that can make you sick. When you have alopecia areata, the immune system attacks the hair follicles. ?What are the causes? ?The cause of this condition is not known. ?What increases the risk? ?You are more likely to develop this condition if you have: ?A family history of alopecia. ?A family history of another autoimmune disease, including type 1 diabetes and thyroid autoimmune disease. ?Eczema, asthma, and allergies. ?Down syndrome. ?What are the signs or symptoms? ?The main symptom of this condition is round spots of patchy hair loss on the scalp. The spots may be mildly itchy. Other symptoms include: ?Short dark hairs in the bald patches that are wider at the top (exclamation point hairs). ?Dents, white spots, or lines in the fingernails or toenails. ?Balding and body hair loss. This is rare. ?Alopecia areata usually develops in childhood, but it can develop at any age. For some people, their hair grows back on its own and hair loss does not happen again. For others, their hair may fall out and grow back in cycles. The hair loss may last many years. ?How is this diagnosed? ?This condition is diagnosed based on your symptoms and family history. Your health care provider will also check your scalp skin, teeth, and nails. Your health care provider may refer you to a specialist in hair and skin disorders (dermatologist). ?You may also have tests, including: ?A hair pull test. ?Blood tests or other screening tests to check for autoimmune  diseases, such as thyroid disease or diabetes. ?Skin biopsy to confirm the diagnosis. ?A procedure to examine the skin with a lighted magnifying instrument (dermoscopy). ?How is this treated? ?There is no cure for alopecia areata. The goals of treatment are to promote the regrowth of hair and prevent the immune system from overreacting. No single treatment is right for all people with alopecia areata. It depends on the type of hair loss you have and how severe it is. ?Work with your health care provider to find the best treatment for you. Treatment may include: ?Regular checkups to make sure the condition is not getting worse . This is called watchful waiting. ?Using steroid creams or pills for 6-8 weeks to stop the immune reaction and help hair to regrow more quickly. ?Using other medicines on your skin (topical medicines) to change the immune system response and support the hair growth cycle. ?Steroid injections. ?Therapy and counseling with a support group or therapist if you are having trouble coping with hair loss. ?Follow these instructions at home: ?Medicines ?Apply topical creams only as told by your health care provider. ?Take over-the-counter and prescription medicines only as told by your health care provider. ?General instructions ?Learn as much as you can about your condition. ?Consider getting a wig or products to make hair look fuller or to cover bald spots, if you feel uncomfortable with your appearance. ?Get therapy or counseling if you are having a hard time coping with hair loss. Ask your health care provider to recommend a counselor or support group. ?Keep all   follow-up visits as told by your health care provider. This is important. ?Where to find more information ?National Alopecia Areata Foundation: naaf.org ?Contact a health care provider if: ?Your hair loss gets worse, even with treatment. ?You have new symptoms. ?You are struggling emotionally. ?Get help right away if: ?You have a sudden  worsening of the hair loss. ?Summary ?Alopecia areata is an autoimmune condition that makes your body's defense system (immune system) attack the hair follicles. This causes you to lose hair. ?Having this condition can be emotionally difficult, but it is not dangerous. ?Treatments may include regular checkups to make sure that the condition is not getting worse, medicines, and steroid injections. ?This information is not intended to replace advice given to you by your health care provider. Make sure you discuss any questions you have with your health care provider. ?Document Revised: 03/23/2019 Document Reviewed: 03/23/2019 ?Elsevier Patient Education ? 2022 Elsevier Inc. ? ?

## 2020-07-14 NOTE — Telephone Encounter (Signed)
Copied from [place] [id number]. Topic: General - Other >> Jul 14, 2020  4:02 PM Celene Kras wrote: Reason for CRM: Pt called to let Baxter Flattery know that she forgot to get her blood work when she left. Pt states that she will try again on Monday. Please advise.

## 2020-07-16 ENCOUNTER — Other Ambulatory Visit: Payer: Self-pay | Admitting: Family Medicine

## 2020-07-16 DIAGNOSIS — M47816 Spondylosis without myelopathy or radiculopathy, lumbar region: Secondary | ICD-10-CM

## 2020-07-16 DIAGNOSIS — M5136 Other intervertebral disc degeneration, lumbar region: Secondary | ICD-10-CM

## 2020-07-17 DIAGNOSIS — I480 Paroxysmal atrial fibrillation: Secondary | ICD-10-CM | POA: Diagnosis not present

## 2020-07-17 DIAGNOSIS — N179 Acute kidney failure, unspecified: Secondary | ICD-10-CM | POA: Diagnosis not present

## 2020-07-17 DIAGNOSIS — I10 Essential (primary) hypertension: Secondary | ICD-10-CM | POA: Diagnosis not present

## 2020-07-17 DIAGNOSIS — Z6832 Body mass index (BMI) 32.0-32.9, adult: Secondary | ICD-10-CM | POA: Diagnosis not present

## 2020-07-17 DIAGNOSIS — D509 Iron deficiency anemia, unspecified: Secondary | ICD-10-CM | POA: Diagnosis not present

## 2020-07-17 DIAGNOSIS — Z7901 Long term (current) use of anticoagulants: Secondary | ICD-10-CM | POA: Diagnosis not present

## 2020-07-17 DIAGNOSIS — M7989 Other specified soft tissue disorders: Secondary | ICD-10-CM | POA: Diagnosis not present

## 2020-07-17 DIAGNOSIS — T8189XD Other complications of procedures, not elsewhere classified, subsequent encounter: Secondary | ICD-10-CM | POA: Diagnosis not present

## 2020-07-17 DIAGNOSIS — M4696 Unspecified inflammatory spondylopathy, lumbar region: Secondary | ICD-10-CM | POA: Diagnosis not present

## 2020-07-17 DIAGNOSIS — E43 Unspecified severe protein-calorie malnutrition: Secondary | ICD-10-CM | POA: Diagnosis not present

## 2020-07-17 DIAGNOSIS — Z853 Personal history of malignant neoplasm of breast: Secondary | ICD-10-CM | POA: Diagnosis not present

## 2020-07-17 DIAGNOSIS — E669 Obesity, unspecified: Secondary | ICD-10-CM | POA: Diagnosis not present

## 2020-07-17 DIAGNOSIS — L659 Nonscarring hair loss, unspecified: Secondary | ICD-10-CM | POA: Diagnosis not present

## 2020-07-17 DIAGNOSIS — I420 Dilated cardiomyopathy: Secondary | ICD-10-CM | POA: Diagnosis not present

## 2020-07-17 DIAGNOSIS — Z85828 Personal history of other malignant neoplasm of skin: Secondary | ICD-10-CM | POA: Diagnosis not present

## 2020-07-17 DIAGNOSIS — Z9181 History of falling: Secondary | ICD-10-CM | POA: Diagnosis not present

## 2020-07-17 DIAGNOSIS — R634 Abnormal weight loss: Secondary | ICD-10-CM | POA: Diagnosis not present

## 2020-07-17 DIAGNOSIS — Z9013 Acquired absence of bilateral breasts and nipples: Secondary | ICD-10-CM | POA: Diagnosis not present

## 2020-07-18 ENCOUNTER — Other Ambulatory Visit: Payer: Self-pay

## 2020-07-18 ENCOUNTER — Other Ambulatory Visit: Payer: Self-pay | Admitting: Family Medicine

## 2020-07-18 DIAGNOSIS — M5136 Other intervertebral disc degeneration, lumbar region: Secondary | ICD-10-CM

## 2020-07-18 DIAGNOSIS — M47816 Spondylosis without myelopathy or radiculopathy, lumbar region: Secondary | ICD-10-CM

## 2020-07-18 DIAGNOSIS — E039 Hypothyroidism, unspecified: Secondary | ICD-10-CM

## 2020-07-18 LAB — CBC WITH DIFFERENTIAL/PLATELET
Basophils Absolute: 0 10*3/uL (ref 0.0–0.2)
Basos: 1 %
EOS (ABSOLUTE): 0.2 10*3/uL (ref 0.0–0.4)
Eos: 3 %
Hematocrit: 40.7 % (ref 34.0–46.6)
Hemoglobin: 13.6 g/dL (ref 11.1–15.9)
Immature Grans (Abs): 0 10*3/uL (ref 0.0–0.1)
Immature Granulocytes: 0 %
Lymphocytes Absolute: 1.5 10*3/uL (ref 0.7–3.1)
Lymphs: 28 %
MCH: 30.7 pg (ref 26.6–33.0)
MCHC: 33.4 g/dL (ref 31.5–35.7)
MCV: 92 fL (ref 79–97)
Monocytes Absolute: 0.6 10*3/uL (ref 0.1–0.9)
Monocytes: 11 %
Neutrophils Absolute: 2.9 10*3/uL (ref 1.4–7.0)
Neutrophils: 57 %
Platelets: 177 10*3/uL (ref 150–450)
RBC: 4.43 x10E6/uL (ref 3.77–5.28)
RDW: 12.8 % (ref 11.7–15.4)
WBC: 5.2 10*3/uL (ref 3.4–10.8)

## 2020-07-18 LAB — THYROID PANEL WITH TSH
Free Thyroxine Index: 1.1 — ABNORMAL LOW (ref 1.2–4.9)
T3 Uptake Ratio: 22 % — ABNORMAL LOW (ref 24–39)
T4, Total: 5.2 ug/dL (ref 4.5–12.0)
TSH: 3.86 u[IU]/mL (ref 0.450–4.500)

## 2020-07-18 MED ORDER — LEVOTHYROXINE SODIUM 25 MCG PO TABS
25.0000 ug | ORAL_TABLET | Freq: Every day | ORAL | 1 refills | Status: DC
Start: 2020-07-18 — End: 2020-09-08

## 2020-07-18 NOTE — Progress Notes (Signed)
Levothyroxine sent in

## 2020-07-19 NOTE — Telephone Encounter (Signed)
Discussed with patient

## 2020-07-21 DIAGNOSIS — T8189XD Other complications of procedures, not elsewhere classified, subsequent encounter: Secondary | ICD-10-CM | POA: Diagnosis not present

## 2020-07-21 DIAGNOSIS — I420 Dilated cardiomyopathy: Secondary | ICD-10-CM | POA: Diagnosis not present

## 2020-07-21 DIAGNOSIS — N179 Acute kidney failure, unspecified: Secondary | ICD-10-CM | POA: Diagnosis not present

## 2020-07-21 DIAGNOSIS — M7989 Other specified soft tissue disorders: Secondary | ICD-10-CM | POA: Diagnosis not present

## 2020-07-21 DIAGNOSIS — E43 Unspecified severe protein-calorie malnutrition: Secondary | ICD-10-CM | POA: Diagnosis not present

## 2020-07-21 DIAGNOSIS — I480 Paroxysmal atrial fibrillation: Secondary | ICD-10-CM | POA: Diagnosis not present

## 2020-07-24 DIAGNOSIS — T8189XD Other complications of procedures, not elsewhere classified, subsequent encounter: Secondary | ICD-10-CM | POA: Diagnosis not present

## 2020-07-24 DIAGNOSIS — E43 Unspecified severe protein-calorie malnutrition: Secondary | ICD-10-CM | POA: Diagnosis not present

## 2020-07-24 DIAGNOSIS — M7989 Other specified soft tissue disorders: Secondary | ICD-10-CM | POA: Diagnosis not present

## 2020-07-24 DIAGNOSIS — I420 Dilated cardiomyopathy: Secondary | ICD-10-CM | POA: Diagnosis not present

## 2020-07-24 DIAGNOSIS — N179 Acute kidney failure, unspecified: Secondary | ICD-10-CM | POA: Diagnosis not present

## 2020-07-24 DIAGNOSIS — I480 Paroxysmal atrial fibrillation: Secondary | ICD-10-CM | POA: Diagnosis not present

## 2020-07-28 DIAGNOSIS — I480 Paroxysmal atrial fibrillation: Secondary | ICD-10-CM | POA: Diagnosis not present

## 2020-07-28 DIAGNOSIS — T8189XD Other complications of procedures, not elsewhere classified, subsequent encounter: Secondary | ICD-10-CM | POA: Diagnosis not present

## 2020-07-28 DIAGNOSIS — E43 Unspecified severe protein-calorie malnutrition: Secondary | ICD-10-CM | POA: Diagnosis not present

## 2020-07-28 DIAGNOSIS — M7989 Other specified soft tissue disorders: Secondary | ICD-10-CM | POA: Diagnosis not present

## 2020-07-28 DIAGNOSIS — I420 Dilated cardiomyopathy: Secondary | ICD-10-CM | POA: Diagnosis not present

## 2020-07-28 DIAGNOSIS — N179 Acute kidney failure, unspecified: Secondary | ICD-10-CM | POA: Diagnosis not present

## 2020-07-31 DIAGNOSIS — E43 Unspecified severe protein-calorie malnutrition: Secondary | ICD-10-CM | POA: Diagnosis not present

## 2020-07-31 DIAGNOSIS — N179 Acute kidney failure, unspecified: Secondary | ICD-10-CM | POA: Diagnosis not present

## 2020-07-31 DIAGNOSIS — T8189XD Other complications of procedures, not elsewhere classified, subsequent encounter: Secondary | ICD-10-CM | POA: Diagnosis not present

## 2020-07-31 DIAGNOSIS — M7989 Other specified soft tissue disorders: Secondary | ICD-10-CM | POA: Diagnosis not present

## 2020-07-31 DIAGNOSIS — I480 Paroxysmal atrial fibrillation: Secondary | ICD-10-CM | POA: Diagnosis not present

## 2020-07-31 DIAGNOSIS — I420 Dilated cardiomyopathy: Secondary | ICD-10-CM | POA: Diagnosis not present

## 2020-08-02 ENCOUNTER — Other Ambulatory Visit: Payer: Self-pay | Admitting: Family Medicine

## 2020-08-02 DIAGNOSIS — I420 Dilated cardiomyopathy: Secondary | ICD-10-CM

## 2020-08-02 DIAGNOSIS — I10 Essential (primary) hypertension: Secondary | ICD-10-CM

## 2020-08-04 DIAGNOSIS — E43 Unspecified severe protein-calorie malnutrition: Secondary | ICD-10-CM | POA: Diagnosis not present

## 2020-08-04 DIAGNOSIS — M7989 Other specified soft tissue disorders: Secondary | ICD-10-CM | POA: Diagnosis not present

## 2020-08-04 DIAGNOSIS — I480 Paroxysmal atrial fibrillation: Secondary | ICD-10-CM | POA: Diagnosis not present

## 2020-08-04 DIAGNOSIS — T8189XD Other complications of procedures, not elsewhere classified, subsequent encounter: Secondary | ICD-10-CM | POA: Diagnosis not present

## 2020-08-04 DIAGNOSIS — N179 Acute kidney failure, unspecified: Secondary | ICD-10-CM | POA: Diagnosis not present

## 2020-08-04 DIAGNOSIS — I420 Dilated cardiomyopathy: Secondary | ICD-10-CM | POA: Diagnosis not present

## 2020-08-07 DIAGNOSIS — I480 Paroxysmal atrial fibrillation: Secondary | ICD-10-CM | POA: Diagnosis not present

## 2020-08-07 DIAGNOSIS — I420 Dilated cardiomyopathy: Secondary | ICD-10-CM | POA: Diagnosis not present

## 2020-08-07 DIAGNOSIS — M7989 Other specified soft tissue disorders: Secondary | ICD-10-CM | POA: Diagnosis not present

## 2020-08-07 DIAGNOSIS — N179 Acute kidney failure, unspecified: Secondary | ICD-10-CM | POA: Diagnosis not present

## 2020-08-07 DIAGNOSIS — E43 Unspecified severe protein-calorie malnutrition: Secondary | ICD-10-CM | POA: Diagnosis not present

## 2020-08-07 DIAGNOSIS — T8189XD Other complications of procedures, not elsewhere classified, subsequent encounter: Secondary | ICD-10-CM | POA: Diagnosis not present

## 2020-08-09 ENCOUNTER — Telehealth: Payer: Self-pay | Admitting: Plastic Surgery

## 2020-08-09 NOTE — Telephone Encounter (Signed)
Cathlyn Parsons with [facility] is requesting new orders for this patient- Promogran Prisma with a change 3x a week. Per [facility] staff, wound is larger and is not healing. Please call Wells Guiles to update [phone]. Thanks.

## 2020-08-11 DIAGNOSIS — I480 Paroxysmal atrial fibrillation: Secondary | ICD-10-CM | POA: Diagnosis not present

## 2020-08-11 DIAGNOSIS — I420 Dilated cardiomyopathy: Secondary | ICD-10-CM | POA: Diagnosis not present

## 2020-08-11 DIAGNOSIS — E43 Unspecified severe protein-calorie malnutrition: Secondary | ICD-10-CM | POA: Diagnosis not present

## 2020-08-11 DIAGNOSIS — M7989 Other specified soft tissue disorders: Secondary | ICD-10-CM | POA: Diagnosis not present

## 2020-08-11 DIAGNOSIS — T8189XD Other complications of procedures, not elsewhere classified, subsequent encounter: Secondary | ICD-10-CM | POA: Diagnosis not present

## 2020-08-11 DIAGNOSIS — N179 Acute kidney failure, unspecified: Secondary | ICD-10-CM | POA: Diagnosis not present

## 2020-08-11 NOTE — Telephone Encounter (Signed)
Returned Blima Singer call with [facility]. LMVM, Per Dr. Tommie Ard to change dressings 3xs a week. She has a follow up appointment with Dr. Claudia Desanctis on 08/16/2020.

## 2020-08-14 ENCOUNTER — Telehealth: Payer: Self-pay | Admitting: Plastic Surgery

## 2020-08-14 DIAGNOSIS — N179 Acute kidney failure, unspecified: Secondary | ICD-10-CM | POA: Diagnosis not present

## 2020-08-14 DIAGNOSIS — M7989 Other specified soft tissue disorders: Secondary | ICD-10-CM | POA: Diagnosis not present

## 2020-08-14 DIAGNOSIS — I480 Paroxysmal atrial fibrillation: Secondary | ICD-10-CM | POA: Diagnosis not present

## 2020-08-14 DIAGNOSIS — E43 Unspecified severe protein-calorie malnutrition: Secondary | ICD-10-CM | POA: Diagnosis not present

## 2020-08-14 DIAGNOSIS — I420 Dilated cardiomyopathy: Secondary | ICD-10-CM | POA: Diagnosis not present

## 2020-08-14 DIAGNOSIS — T8189XD Other complications of procedures, not elsewhere classified, subsequent encounter: Secondary | ICD-10-CM | POA: Diagnosis not present

## 2020-08-14 NOTE — Telephone Encounter (Signed)
Received a call back from Amada Jupiter, nurse with [facility].  She stated that the patient has an appointment with Dr. Claudia Desanctis on Wed and they will like to get a referral to go to the wound clinic at [facility].    Informed Ms Wynetta Emery, nurse with [facility] that we will inform Dr. Claudia Desanctis of the request for the referral to wound clinic at [facility]

## 2020-08-14 NOTE — Telephone Encounter (Signed)
Called and LMOM @ 1:46pm asking Ms Melissa Chase to RTC regarding recent message below.//AB/CMA

## 2020-08-14 NOTE — Telephone Encounter (Signed)
Amada Jupiter, nurse with [facility], left voicemail regarding patient. Please call [phone].

## 2020-08-16 ENCOUNTER — Ambulatory Visit (INDEPENDENT_AMBULATORY_CARE_PROVIDER_SITE_OTHER): Payer: Medicare Other | Admitting: Plastic Surgery

## 2020-08-16 ENCOUNTER — Encounter: Payer: Self-pay | Admitting: Plastic Surgery

## 2020-08-16 ENCOUNTER — Other Ambulatory Visit: Payer: Self-pay

## 2020-08-16 DIAGNOSIS — M4696 Unspecified inflammatory spondylopathy, lumbar region: Secondary | ICD-10-CM | POA: Diagnosis not present

## 2020-08-16 DIAGNOSIS — M7989 Other specified soft tissue disorders: Secondary | ICD-10-CM | POA: Diagnosis not present

## 2020-08-16 DIAGNOSIS — Z6832 Body mass index (BMI) 32.0-32.9, adult: Secondary | ICD-10-CM | POA: Diagnosis not present

## 2020-08-16 DIAGNOSIS — E669 Obesity, unspecified: Secondary | ICD-10-CM | POA: Diagnosis not present

## 2020-08-16 DIAGNOSIS — Z853 Personal history of malignant neoplasm of breast: Secondary | ICD-10-CM | POA: Diagnosis not present

## 2020-08-16 DIAGNOSIS — E43 Unspecified severe protein-calorie malnutrition: Secondary | ICD-10-CM | POA: Diagnosis not present

## 2020-08-16 DIAGNOSIS — Z85828 Personal history of other malignant neoplasm of skin: Secondary | ICD-10-CM | POA: Diagnosis not present

## 2020-08-16 DIAGNOSIS — I10 Essential (primary) hypertension: Secondary | ICD-10-CM | POA: Diagnosis not present

## 2020-08-16 DIAGNOSIS — Z9013 Acquired absence of bilateral breasts and nipples: Secondary | ICD-10-CM | POA: Diagnosis not present

## 2020-08-16 DIAGNOSIS — I420 Dilated cardiomyopathy: Secondary | ICD-10-CM | POA: Diagnosis not present

## 2020-08-16 DIAGNOSIS — Z791 Long term (current) use of non-steroidal anti-inflammatories (NSAID): Secondary | ICD-10-CM | POA: Diagnosis not present

## 2020-08-16 DIAGNOSIS — Z9181 History of falling: Secondary | ICD-10-CM | POA: Diagnosis not present

## 2020-08-16 DIAGNOSIS — Z7901 Long term (current) use of anticoagulants: Secondary | ICD-10-CM | POA: Diagnosis not present

## 2020-08-16 DIAGNOSIS — D509 Iron deficiency anemia, unspecified: Secondary | ICD-10-CM | POA: Diagnosis not present

## 2020-08-16 DIAGNOSIS — I480 Paroxysmal atrial fibrillation: Secondary | ICD-10-CM | POA: Diagnosis not present

## 2020-08-16 DIAGNOSIS — N179 Acute kidney failure, unspecified: Secondary | ICD-10-CM | POA: Diagnosis not present

## 2020-08-16 DIAGNOSIS — S21002A Unspecified open wound of left breast, initial encounter: Secondary | ICD-10-CM

## 2020-08-16 DIAGNOSIS — R634 Abnormal weight loss: Secondary | ICD-10-CM | POA: Diagnosis not present

## 2020-08-16 DIAGNOSIS — T8189XD Other complications of procedures, not elsewhere classified, subsequent encounter: Secondary | ICD-10-CM | POA: Diagnosis not present

## 2020-08-16 NOTE — Progress Notes (Signed)
   Referring Provider Juline Patch, MD [address] [place],  [place] [address]   CC:  Chief Complaint  Patient presents with   Follow-up      Melissa Chase is an 80 y.o. female.  HPI: Patient presents several months out from failed tissue expander breast reconstruction.  She was unable to heal the wound on the left side.  She has been doing wound care on it and feels like it is made quite a bit of progress.  There is still a little bit of tunneling extending laterally in patients here for evaluation of that.  Review of Systems General: Denies fevers and chills  Physical Exam Vitals with BMI 07/14/2020 06/09/2020 04/26/2020  Height '5\' 5"'$  - '5\' 5"'$   Weight 192 lbs - 195 lbs  BMI [id number] - [id number]  Systolic [id number]  Diastolic 62 65 80  Pulse 60 53 64    General:  No acute distress,  Alert and oriented, Non-Toxic, Normal speech and affect Examination shows a approximately 2 cm open wound along her incision in the left chest area.  There is a little bit of tunneling that extends laterally.  There is no purulence or signs of infection.  Assessment/Plan I long discussion with the patient about her options.  I explained that surgical treatment would involve debriding the cavity and advancing a rotational flap to close it.  We did discuss the fact that she was set back quite a bit functionally by some of her prior operations.  She is in very good spirits and overall seems very enthusiastic but I would be concerned that an additional operation might cause her further functional decline and may not end up leaving her better off..  We agreed to try wound care center at this point and she seems close to a [facility] wound care center where she lives around the [place] area so we will try that for a few months and see how that goes.  Hopefully will close down even further and make a surgical intervention less appealing for her.  I will plan to see her again in 3 months.  All of her questions  were answered.  Cindra Presume 08/16/2020, 4:50 PM

## 2020-08-18 DIAGNOSIS — M7989 Other specified soft tissue disorders: Secondary | ICD-10-CM | POA: Diagnosis not present

## 2020-08-18 DIAGNOSIS — E43 Unspecified severe protein-calorie malnutrition: Secondary | ICD-10-CM | POA: Diagnosis not present

## 2020-08-18 DIAGNOSIS — N179 Acute kidney failure, unspecified: Secondary | ICD-10-CM | POA: Diagnosis not present

## 2020-08-18 DIAGNOSIS — T8189XD Other complications of procedures, not elsewhere classified, subsequent encounter: Secondary | ICD-10-CM | POA: Diagnosis not present

## 2020-08-18 DIAGNOSIS — I480 Paroxysmal atrial fibrillation: Secondary | ICD-10-CM | POA: Diagnosis not present

## 2020-08-18 DIAGNOSIS — I420 Dilated cardiomyopathy: Secondary | ICD-10-CM | POA: Diagnosis not present

## 2020-08-21 DIAGNOSIS — I420 Dilated cardiomyopathy: Secondary | ICD-10-CM | POA: Diagnosis not present

## 2020-08-21 DIAGNOSIS — E43 Unspecified severe protein-calorie malnutrition: Secondary | ICD-10-CM | POA: Diagnosis not present

## 2020-08-21 DIAGNOSIS — T8189XD Other complications of procedures, not elsewhere classified, subsequent encounter: Secondary | ICD-10-CM | POA: Diagnosis not present

## 2020-08-21 DIAGNOSIS — M7989 Other specified soft tissue disorders: Secondary | ICD-10-CM | POA: Diagnosis not present

## 2020-08-21 DIAGNOSIS — N179 Acute kidney failure, unspecified: Secondary | ICD-10-CM | POA: Diagnosis not present

## 2020-08-21 DIAGNOSIS — I480 Paroxysmal atrial fibrillation: Secondary | ICD-10-CM | POA: Diagnosis not present

## 2020-08-25 DIAGNOSIS — N179 Acute kidney failure, unspecified: Secondary | ICD-10-CM | POA: Diagnosis not present

## 2020-08-25 DIAGNOSIS — I480 Paroxysmal atrial fibrillation: Secondary | ICD-10-CM | POA: Diagnosis not present

## 2020-08-25 DIAGNOSIS — I420 Dilated cardiomyopathy: Secondary | ICD-10-CM | POA: Diagnosis not present

## 2020-08-25 DIAGNOSIS — E43 Unspecified severe protein-calorie malnutrition: Secondary | ICD-10-CM | POA: Diagnosis not present

## 2020-08-25 DIAGNOSIS — T8189XD Other complications of procedures, not elsewhere classified, subsequent encounter: Secondary | ICD-10-CM | POA: Diagnosis not present

## 2020-08-25 DIAGNOSIS — M7989 Other specified soft tissue disorders: Secondary | ICD-10-CM | POA: Diagnosis not present

## 2020-08-28 DIAGNOSIS — T8189XD Other complications of procedures, not elsewhere classified, subsequent encounter: Secondary | ICD-10-CM | POA: Diagnosis not present

## 2020-08-28 DIAGNOSIS — N179 Acute kidney failure, unspecified: Secondary | ICD-10-CM | POA: Diagnosis not present

## 2020-08-28 DIAGNOSIS — M7989 Other specified soft tissue disorders: Secondary | ICD-10-CM | POA: Diagnosis not present

## 2020-08-28 DIAGNOSIS — I420 Dilated cardiomyopathy: Secondary | ICD-10-CM | POA: Diagnosis not present

## 2020-08-28 DIAGNOSIS — I480 Paroxysmal atrial fibrillation: Secondary | ICD-10-CM | POA: Diagnosis not present

## 2020-08-28 DIAGNOSIS — E43 Unspecified severe protein-calorie malnutrition: Secondary | ICD-10-CM | POA: Diagnosis not present

## 2020-09-01 DIAGNOSIS — I420 Dilated cardiomyopathy: Secondary | ICD-10-CM | POA: Diagnosis not present

## 2020-09-01 DIAGNOSIS — E43 Unspecified severe protein-calorie malnutrition: Secondary | ICD-10-CM | POA: Diagnosis not present

## 2020-09-01 DIAGNOSIS — M7989 Other specified soft tissue disorders: Secondary | ICD-10-CM | POA: Diagnosis not present

## 2020-09-01 DIAGNOSIS — I480 Paroxysmal atrial fibrillation: Secondary | ICD-10-CM | POA: Diagnosis not present

## 2020-09-01 DIAGNOSIS — T8189XD Other complications of procedures, not elsewhere classified, subsequent encounter: Secondary | ICD-10-CM | POA: Diagnosis not present

## 2020-09-01 DIAGNOSIS — N179 Acute kidney failure, unspecified: Secondary | ICD-10-CM | POA: Diagnosis not present

## 2020-09-04 DIAGNOSIS — I420 Dilated cardiomyopathy: Secondary | ICD-10-CM | POA: Diagnosis not present

## 2020-09-04 DIAGNOSIS — I480 Paroxysmal atrial fibrillation: Secondary | ICD-10-CM | POA: Diagnosis not present

## 2020-09-04 DIAGNOSIS — N179 Acute kidney failure, unspecified: Secondary | ICD-10-CM | POA: Diagnosis not present

## 2020-09-04 DIAGNOSIS — M7989 Other specified soft tissue disorders: Secondary | ICD-10-CM | POA: Diagnosis not present

## 2020-09-04 DIAGNOSIS — T8189XD Other complications of procedures, not elsewhere classified, subsequent encounter: Secondary | ICD-10-CM | POA: Diagnosis not present

## 2020-09-04 DIAGNOSIS — E43 Unspecified severe protein-calorie malnutrition: Secondary | ICD-10-CM | POA: Diagnosis not present

## 2020-09-07 DIAGNOSIS — Z923 Personal history of irradiation: Secondary | ICD-10-CM | POA: Insufficient documentation

## 2020-09-07 DIAGNOSIS — Z79899 Other long term (current) drug therapy: Secondary | ICD-10-CM | POA: Diagnosis not present

## 2020-09-07 DIAGNOSIS — I10 Essential (primary) hypertension: Secondary | ICD-10-CM | POA: Diagnosis not present

## 2020-09-07 DIAGNOSIS — S21102A Unspecified open wound of left front wall of thorax without penetration into thoracic cavity, initial encounter: Secondary | ICD-10-CM | POA: Diagnosis not present

## 2020-09-07 DIAGNOSIS — Z9013 Acquired absence of bilateral breasts and nipples: Secondary | ICD-10-CM | POA: Diagnosis not present

## 2020-09-07 DIAGNOSIS — Z853 Personal history of malignant neoplasm of breast: Secondary | ICD-10-CM | POA: Diagnosis not present

## 2020-09-08 ENCOUNTER — Other Ambulatory Visit: Payer: Self-pay | Admitting: Family Medicine

## 2020-09-08 DIAGNOSIS — M7989 Other specified soft tissue disorders: Secondary | ICD-10-CM | POA: Diagnosis not present

## 2020-09-08 DIAGNOSIS — N179 Acute kidney failure, unspecified: Secondary | ICD-10-CM | POA: Diagnosis not present

## 2020-09-08 DIAGNOSIS — I480 Paroxysmal atrial fibrillation: Secondary | ICD-10-CM | POA: Diagnosis not present

## 2020-09-08 DIAGNOSIS — T8189XD Other complications of procedures, not elsewhere classified, subsequent encounter: Secondary | ICD-10-CM | POA: Diagnosis not present

## 2020-09-08 DIAGNOSIS — E039 Hypothyroidism, unspecified: Secondary | ICD-10-CM

## 2020-09-08 DIAGNOSIS — I420 Dilated cardiomyopathy: Secondary | ICD-10-CM | POA: Diagnosis not present

## 2020-09-08 DIAGNOSIS — E43 Unspecified severe protein-calorie malnutrition: Secondary | ICD-10-CM | POA: Diagnosis not present

## 2020-09-08 NOTE — Telephone Encounter (Signed)
  Requested medication (s) are on the active medication list: yes  Last refill:  08/14/2020  Future visit scheduled: yes  Notes to clinic:  Failed protocol  TSH needs to be rechecked within 3 months after an abnormal result. Refill until TSH is due  Requested Prescriptions  Pending Prescriptions Disp Refills   levothyroxine (SYNTHROID) 25 MCG tablet [Pharmacy Med Name: LEVOTHYROXINE 25 MCG TABLET] 30 tablet 1    Sig: TAKE 1 TABLET BY MOUTH EVERY DAY     Endocrinology:  Hypothyroid Agents Failed - 09/08/2020 12:30 PM      Failed - TSH needs to be rechecked within 3 months after an abnormal result. Refill until TSH is due.      Passed - TSH in normal range and within 360 days    TSH  Date Value Ref Range Status  07/17/2020 3.860 0.450 - 4.500 uIU/mL Final          Passed - Valid encounter within last 12 months    Recent Outpatient Visits           1 month ago Hair loss   [facility], MD   4 months ago Memory loss, short term   [facility] Juline Patch, MD   5 months ago Degenerative lumbar disc   [facility] Juline Patch, MD   6 months ago Degenerative lumbar disc   [facility] Juline Patch, MD   6 months ago Hospital discharge follow-up   [facility], MD       Future Appointments             In 1 month Juline Patch, MD [facility]

## 2020-09-11 DIAGNOSIS — T8189XD Other complications of procedures, not elsewhere classified, subsequent encounter: Secondary | ICD-10-CM | POA: Diagnosis not present

## 2020-09-11 DIAGNOSIS — M7989 Other specified soft tissue disorders: Secondary | ICD-10-CM | POA: Diagnosis not present

## 2020-09-11 DIAGNOSIS — I480 Paroxysmal atrial fibrillation: Secondary | ICD-10-CM | POA: Diagnosis not present

## 2020-09-11 DIAGNOSIS — I420 Dilated cardiomyopathy: Secondary | ICD-10-CM | POA: Diagnosis not present

## 2020-09-11 DIAGNOSIS — N179 Acute kidney failure, unspecified: Secondary | ICD-10-CM | POA: Diagnosis not present

## 2020-09-11 DIAGNOSIS — E43 Unspecified severe protein-calorie malnutrition: Secondary | ICD-10-CM | POA: Diagnosis not present

## 2020-09-15 DIAGNOSIS — M4696 Unspecified inflammatory spondylopathy, lumbar region: Secondary | ICD-10-CM | POA: Diagnosis not present

## 2020-09-15 DIAGNOSIS — N179 Acute kidney failure, unspecified: Secondary | ICD-10-CM | POA: Diagnosis not present

## 2020-09-15 DIAGNOSIS — R634 Abnormal weight loss: Secondary | ICD-10-CM | POA: Diagnosis not present

## 2020-09-15 DIAGNOSIS — Z9013 Acquired absence of bilateral breasts and nipples: Secondary | ICD-10-CM | POA: Diagnosis not present

## 2020-09-15 DIAGNOSIS — Z9181 History of falling: Secondary | ICD-10-CM | POA: Diagnosis not present

## 2020-09-15 DIAGNOSIS — Z6832 Body mass index (BMI) 32.0-32.9, adult: Secondary | ICD-10-CM | POA: Diagnosis not present

## 2020-09-15 DIAGNOSIS — T8189XD Other complications of procedures, not elsewhere classified, subsequent encounter: Secondary | ICD-10-CM | POA: Diagnosis not present

## 2020-09-15 DIAGNOSIS — Z853 Personal history of malignant neoplasm of breast: Secondary | ICD-10-CM | POA: Diagnosis not present

## 2020-09-15 DIAGNOSIS — E669 Obesity, unspecified: Secondary | ICD-10-CM | POA: Diagnosis not present

## 2020-09-15 DIAGNOSIS — Z85828 Personal history of other malignant neoplasm of skin: Secondary | ICD-10-CM | POA: Diagnosis not present

## 2020-09-15 DIAGNOSIS — I420 Dilated cardiomyopathy: Secondary | ICD-10-CM | POA: Diagnosis not present

## 2020-09-15 DIAGNOSIS — E43 Unspecified severe protein-calorie malnutrition: Secondary | ICD-10-CM | POA: Diagnosis not present

## 2020-09-15 DIAGNOSIS — M7989 Other specified soft tissue disorders: Secondary | ICD-10-CM | POA: Diagnosis not present

## 2020-09-15 DIAGNOSIS — Z7901 Long term (current) use of anticoagulants: Secondary | ICD-10-CM | POA: Diagnosis not present

## 2020-09-15 DIAGNOSIS — I480 Paroxysmal atrial fibrillation: Secondary | ICD-10-CM | POA: Diagnosis not present

## 2020-09-15 DIAGNOSIS — Z791 Long term (current) use of non-steroidal anti-inflammatories (NSAID): Secondary | ICD-10-CM | POA: Diagnosis not present

## 2020-09-15 DIAGNOSIS — D509 Iron deficiency anemia, unspecified: Secondary | ICD-10-CM | POA: Diagnosis not present

## 2020-09-15 DIAGNOSIS — I10 Essential (primary) hypertension: Secondary | ICD-10-CM | POA: Diagnosis not present

## 2020-09-18 DIAGNOSIS — I420 Dilated cardiomyopathy: Secondary | ICD-10-CM | POA: Diagnosis not present

## 2020-09-18 DIAGNOSIS — N179 Acute kidney failure, unspecified: Secondary | ICD-10-CM | POA: Diagnosis not present

## 2020-09-18 DIAGNOSIS — I480 Paroxysmal atrial fibrillation: Secondary | ICD-10-CM | POA: Diagnosis not present

## 2020-09-18 DIAGNOSIS — M7989 Other specified soft tissue disorders: Secondary | ICD-10-CM | POA: Diagnosis not present

## 2020-09-18 DIAGNOSIS — T8189XD Other complications of procedures, not elsewhere classified, subsequent encounter: Secondary | ICD-10-CM | POA: Diagnosis not present

## 2020-09-18 DIAGNOSIS — E43 Unspecified severe protein-calorie malnutrition: Secondary | ICD-10-CM | POA: Diagnosis not present

## 2020-09-22 ENCOUNTER — Telehealth: Payer: Self-pay

## 2020-09-22 NOTE — Telephone Encounter (Signed)
Contacted [facility] for the second time to give them Dr Silverio Lay Pace's information to sign papers. I had to leave a message with the above information.

## 2020-09-27 ENCOUNTER — Other Ambulatory Visit: Payer: Self-pay | Admitting: Family Medicine

## 2020-09-27 DIAGNOSIS — M7989 Other specified soft tissue disorders: Secondary | ICD-10-CM | POA: Diagnosis not present

## 2020-09-27 DIAGNOSIS — I480 Paroxysmal atrial fibrillation: Secondary | ICD-10-CM | POA: Diagnosis not present

## 2020-09-27 DIAGNOSIS — I420 Dilated cardiomyopathy: Secondary | ICD-10-CM | POA: Diagnosis not present

## 2020-09-27 DIAGNOSIS — M5136 Other intervertebral disc degeneration, lumbar region: Secondary | ICD-10-CM

## 2020-09-27 DIAGNOSIS — N179 Acute kidney failure, unspecified: Secondary | ICD-10-CM | POA: Diagnosis not present

## 2020-09-27 DIAGNOSIS — E43 Unspecified severe protein-calorie malnutrition: Secondary | ICD-10-CM | POA: Diagnosis not present

## 2020-09-27 DIAGNOSIS — T8189XD Other complications of procedures, not elsewhere classified, subsequent encounter: Secondary | ICD-10-CM | POA: Diagnosis not present

## 2020-09-27 DIAGNOSIS — M47816 Spondylosis without myelopathy or radiculopathy, lumbar region: Secondary | ICD-10-CM

## 2020-09-28 DIAGNOSIS — I420 Dilated cardiomyopathy: Secondary | ICD-10-CM | POA: Diagnosis not present

## 2020-09-28 DIAGNOSIS — N179 Acute kidney failure, unspecified: Secondary | ICD-10-CM | POA: Diagnosis not present

## 2020-09-28 DIAGNOSIS — M7989 Other specified soft tissue disorders: Secondary | ICD-10-CM | POA: Diagnosis not present

## 2020-09-28 DIAGNOSIS — E43 Unspecified severe protein-calorie malnutrition: Secondary | ICD-10-CM | POA: Diagnosis not present

## 2020-09-28 DIAGNOSIS — I480 Paroxysmal atrial fibrillation: Secondary | ICD-10-CM | POA: Diagnosis not present

## 2020-09-28 DIAGNOSIS — T8189XD Other complications of procedures, not elsewhere classified, subsequent encounter: Secondary | ICD-10-CM | POA: Diagnosis not present

## 2020-09-29 DIAGNOSIS — E43 Unspecified severe protein-calorie malnutrition: Secondary | ICD-10-CM | POA: Diagnosis not present

## 2020-09-29 DIAGNOSIS — N179 Acute kidney failure, unspecified: Secondary | ICD-10-CM | POA: Diagnosis not present

## 2020-09-29 DIAGNOSIS — I480 Paroxysmal atrial fibrillation: Secondary | ICD-10-CM | POA: Diagnosis not present

## 2020-09-29 DIAGNOSIS — T8189XD Other complications of procedures, not elsewhere classified, subsequent encounter: Secondary | ICD-10-CM | POA: Diagnosis not present

## 2020-09-29 DIAGNOSIS — M7989 Other specified soft tissue disorders: Secondary | ICD-10-CM | POA: Diagnosis not present

## 2020-09-29 DIAGNOSIS — I420 Dilated cardiomyopathy: Secondary | ICD-10-CM | POA: Diagnosis not present

## 2020-10-02 ENCOUNTER — Telehealth: Payer: Self-pay

## 2020-10-02 DIAGNOSIS — I420 Dilated cardiomyopathy: Secondary | ICD-10-CM | POA: Diagnosis not present

## 2020-10-02 DIAGNOSIS — N179 Acute kidney failure, unspecified: Secondary | ICD-10-CM | POA: Diagnosis not present

## 2020-10-02 DIAGNOSIS — M7989 Other specified soft tissue disorders: Secondary | ICD-10-CM | POA: Diagnosis not present

## 2020-10-02 DIAGNOSIS — T8189XD Other complications of procedures, not elsewhere classified, subsequent encounter: Secondary | ICD-10-CM | POA: Diagnosis not present

## 2020-10-02 DIAGNOSIS — E43 Unspecified severe protein-calorie malnutrition: Secondary | ICD-10-CM | POA: Diagnosis not present

## 2020-10-02 DIAGNOSIS — I480 Paroxysmal atrial fibrillation: Secondary | ICD-10-CM | POA: Diagnosis not present

## 2020-10-02 NOTE — Telephone Encounter (Signed)
Contacted AV Carlota Raspberry with [facility] services again. I gave her Dr Keane Scrape information as well as the [facility] to send the documents to them. Her call back is [phone]. She asked that I shred the papers and she would contact those offices.

## 2020-10-04 ENCOUNTER — Other Ambulatory Visit: Payer: Self-pay | Admitting: Family Medicine

## 2020-10-04 DIAGNOSIS — E039 Hypothyroidism, unspecified: Secondary | ICD-10-CM

## 2020-10-06 DIAGNOSIS — N179 Acute kidney failure, unspecified: Secondary | ICD-10-CM | POA: Diagnosis not present

## 2020-10-06 DIAGNOSIS — I480 Paroxysmal atrial fibrillation: Secondary | ICD-10-CM | POA: Diagnosis not present

## 2020-10-06 DIAGNOSIS — M7989 Other specified soft tissue disorders: Secondary | ICD-10-CM | POA: Diagnosis not present

## 2020-10-06 DIAGNOSIS — T8189XD Other complications of procedures, not elsewhere classified, subsequent encounter: Secondary | ICD-10-CM | POA: Diagnosis not present

## 2020-10-06 DIAGNOSIS — I420 Dilated cardiomyopathy: Secondary | ICD-10-CM | POA: Diagnosis not present

## 2020-10-06 DIAGNOSIS — E43 Unspecified severe protein-calorie malnutrition: Secondary | ICD-10-CM | POA: Diagnosis not present

## 2020-10-09 DIAGNOSIS — E43 Unspecified severe protein-calorie malnutrition: Secondary | ICD-10-CM | POA: Diagnosis not present

## 2020-10-09 DIAGNOSIS — T8189XD Other complications of procedures, not elsewhere classified, subsequent encounter: Secondary | ICD-10-CM | POA: Diagnosis not present

## 2020-10-09 DIAGNOSIS — M7989 Other specified soft tissue disorders: Secondary | ICD-10-CM | POA: Diagnosis not present

## 2020-10-09 DIAGNOSIS — I480 Paroxysmal atrial fibrillation: Secondary | ICD-10-CM | POA: Diagnosis not present

## 2020-10-09 DIAGNOSIS — I420 Dilated cardiomyopathy: Secondary | ICD-10-CM | POA: Diagnosis not present

## 2020-10-09 DIAGNOSIS — N179 Acute kidney failure, unspecified: Secondary | ICD-10-CM | POA: Diagnosis not present

## 2020-10-12 ENCOUNTER — Other Ambulatory Visit: Payer: Self-pay

## 2020-10-12 ENCOUNTER — Ambulatory Visit: Payer: Medicare Other | Admitting: Family Medicine

## 2020-10-12 DIAGNOSIS — I420 Dilated cardiomyopathy: Secondary | ICD-10-CM | POA: Diagnosis not present

## 2020-10-12 DIAGNOSIS — N179 Acute kidney failure, unspecified: Secondary | ICD-10-CM | POA: Diagnosis not present

## 2020-10-12 DIAGNOSIS — E43 Unspecified severe protein-calorie malnutrition: Secondary | ICD-10-CM | POA: Diagnosis not present

## 2020-10-12 DIAGNOSIS — T8189XD Other complications of procedures, not elsewhere classified, subsequent encounter: Secondary | ICD-10-CM | POA: Diagnosis not present

## 2020-10-12 DIAGNOSIS — M7989 Other specified soft tissue disorders: Secondary | ICD-10-CM | POA: Diagnosis not present

## 2020-10-12 DIAGNOSIS — I480 Paroxysmal atrial fibrillation: Secondary | ICD-10-CM | POA: Diagnosis not present

## 2020-10-13 DIAGNOSIS — I480 Paroxysmal atrial fibrillation: Secondary | ICD-10-CM | POA: Diagnosis not present

## 2020-10-13 DIAGNOSIS — N179 Acute kidney failure, unspecified: Secondary | ICD-10-CM | POA: Diagnosis not present

## 2020-10-13 DIAGNOSIS — T8189XD Other complications of procedures, not elsewhere classified, subsequent encounter: Secondary | ICD-10-CM | POA: Diagnosis not present

## 2020-10-13 DIAGNOSIS — M7989 Other specified soft tissue disorders: Secondary | ICD-10-CM | POA: Diagnosis not present

## 2020-10-13 DIAGNOSIS — E43 Unspecified severe protein-calorie malnutrition: Secondary | ICD-10-CM | POA: Diagnosis not present

## 2020-10-13 DIAGNOSIS — I420 Dilated cardiomyopathy: Secondary | ICD-10-CM | POA: Diagnosis not present

## 2020-10-15 DIAGNOSIS — D509 Iron deficiency anemia, unspecified: Secondary | ICD-10-CM | POA: Diagnosis not present

## 2020-10-15 DIAGNOSIS — R634 Abnormal weight loss: Secondary | ICD-10-CM | POA: Diagnosis not present

## 2020-10-15 DIAGNOSIS — Z79891 Long term (current) use of opiate analgesic: Secondary | ICD-10-CM | POA: Diagnosis not present

## 2020-10-15 DIAGNOSIS — Z7901 Long term (current) use of anticoagulants: Secondary | ICD-10-CM | POA: Diagnosis not present

## 2020-10-15 DIAGNOSIS — I480 Paroxysmal atrial fibrillation: Secondary | ICD-10-CM | POA: Diagnosis not present

## 2020-10-15 DIAGNOSIS — Z9181 History of falling: Secondary | ICD-10-CM | POA: Diagnosis not present

## 2020-10-15 DIAGNOSIS — Z791 Long term (current) use of non-steroidal anti-inflammatories (NSAID): Secondary | ICD-10-CM | POA: Diagnosis not present

## 2020-10-15 DIAGNOSIS — Z85828 Personal history of other malignant neoplasm of skin: Secondary | ICD-10-CM | POA: Diagnosis not present

## 2020-10-15 DIAGNOSIS — I420 Dilated cardiomyopathy: Secondary | ICD-10-CM | POA: Diagnosis not present

## 2020-10-15 DIAGNOSIS — T8189XD Other complications of procedures, not elsewhere classified, subsequent encounter: Secondary | ICD-10-CM | POA: Diagnosis not present

## 2020-10-15 DIAGNOSIS — N179 Acute kidney failure, unspecified: Secondary | ICD-10-CM | POA: Diagnosis not present

## 2020-10-15 DIAGNOSIS — M4696 Unspecified inflammatory spondylopathy, lumbar region: Secondary | ICD-10-CM | POA: Diagnosis not present

## 2020-10-15 DIAGNOSIS — Z9013 Acquired absence of bilateral breasts and nipples: Secondary | ICD-10-CM | POA: Diagnosis not present

## 2020-10-15 DIAGNOSIS — Z853 Personal history of malignant neoplasm of breast: Secondary | ICD-10-CM | POA: Diagnosis not present

## 2020-10-15 DIAGNOSIS — E669 Obesity, unspecified: Secondary | ICD-10-CM | POA: Diagnosis not present

## 2020-10-15 DIAGNOSIS — I10 Essential (primary) hypertension: Secondary | ICD-10-CM | POA: Diagnosis not present

## 2020-10-15 DIAGNOSIS — M7989 Other specified soft tissue disorders: Secondary | ICD-10-CM | POA: Diagnosis not present

## 2020-10-15 DIAGNOSIS — E43 Unspecified severe protein-calorie malnutrition: Secondary | ICD-10-CM | POA: Diagnosis not present

## 2020-10-15 DIAGNOSIS — Z6832 Body mass index (BMI) 32.0-32.9, adult: Secondary | ICD-10-CM | POA: Diagnosis not present

## 2020-10-16 DIAGNOSIS — Z923 Personal history of irradiation: Secondary | ICD-10-CM | POA: Diagnosis not present

## 2020-10-16 DIAGNOSIS — S21102A Unspecified open wound of left front wall of thorax without penetration into thoracic cavity, initial encounter: Secondary | ICD-10-CM | POA: Diagnosis not present

## 2020-10-16 DIAGNOSIS — T8189XD Other complications of procedures, not elsewhere classified, subsequent encounter: Secondary | ICD-10-CM | POA: Diagnosis not present

## 2020-10-16 DIAGNOSIS — T8189XA Other complications of procedures, not elsewhere classified, initial encounter: Secondary | ICD-10-CM | POA: Diagnosis not present

## 2020-10-16 DIAGNOSIS — S21102D Unspecified open wound of left front wall of thorax without penetration into thoracic cavity, subsequent encounter: Secondary | ICD-10-CM | POA: Diagnosis not present

## 2020-10-17 ENCOUNTER — Other Ambulatory Visit: Payer: Self-pay

## 2020-10-17 ENCOUNTER — Ambulatory Visit (INDEPENDENT_AMBULATORY_CARE_PROVIDER_SITE_OTHER): Payer: Medicare Other | Admitting: Family Medicine

## 2020-10-17 ENCOUNTER — Encounter: Payer: Self-pay | Admitting: Family Medicine

## 2020-10-17 VITALS — BP 128/70 | HR 68 | Ht 65.0 in | Wt 201.0 lb

## 2020-10-17 DIAGNOSIS — M47816 Spondylosis without myelopathy or radiculopathy, lumbar region: Secondary | ICD-10-CM

## 2020-10-17 DIAGNOSIS — R4189 Other symptoms and signs involving cognitive functions and awareness: Secondary | ICD-10-CM | POA: Diagnosis not present

## 2020-10-17 DIAGNOSIS — E7849 Other hyperlipidemia: Secondary | ICD-10-CM | POA: Diagnosis not present

## 2020-10-17 DIAGNOSIS — Z23 Encounter for immunization: Secondary | ICD-10-CM | POA: Diagnosis not present

## 2020-10-17 DIAGNOSIS — E039 Hypothyroidism, unspecified: Secondary | ICD-10-CM | POA: Diagnosis not present

## 2020-10-17 DIAGNOSIS — F339 Major depressive disorder, recurrent, unspecified: Secondary | ICD-10-CM | POA: Diagnosis not present

## 2020-10-17 DIAGNOSIS — E785 Hyperlipidemia, unspecified: Secondary | ICD-10-CM

## 2020-10-17 DIAGNOSIS — M5136 Other intervertebral disc degeneration, lumbar region: Secondary | ICD-10-CM

## 2020-10-17 MED ORDER — LEVOTHYROXINE SODIUM 25 MCG PO TABS: 25.0000 ug | ORAL_TABLET | Freq: Every day | ORAL | 1 refills | Status: DC

## 2020-10-17 MED ORDER — SERTRALINE HCL 100 MG PO TABS: 100.0000 mg | ORAL_TABLET | Freq: Every day | ORAL | 1 refills | Status: DC

## 2020-10-17 MED ORDER — PRAVASTATIN SODIUM 20 MG PO TABS: 20.0000 mg | ORAL_TABLET | Freq: Every day | ORAL | 1 refills | Status: DC

## 2020-10-17 NOTE — Progress Notes (Signed)
Date:  10/17/2020   Name:  Melissa Chase   DOB:  February 03, 1940   MRN:  [id number]   Chief Complaint: Hypothyroidism, Depression, Hyperlipidemia, Flu Vaccine, Altered Mental Status, and Insomnia (Cannot go to sleep- trazadone?)  Depression        This is a chronic problem.  The onset quality is gradual.   The problem occurs intermittently.  The problem has been gradually improving since onset.  Associated symptoms include insomnia and irritable.  Associated symptoms include no decreased concentration, no fatigue, no helplessness, no hopelessness, no restlessness, no decreased interest, no appetite change, no body aches, no myalgias, no headaches, no indigestion, not sad and no suicidal ideas.  Past treatments include SSRIs - Selective serotonin reuptake inhibitors.  Compliance with treatment is good.  Previous treatment provided moderate relief.  Past medical history includes hypothyroidism and thyroid problem.   Hyperlipidemia This is a chronic problem. The current episode started more than 1 year ago. Exacerbating diseases include hypothyroidism. She has no history of diabetes. Pertinent negatives include no chest pain, focal sensory loss, focal weakness, leg pain, myalgias or shortness of breath. Current antihyperlipidemic treatment includes statins. The current treatment provides moderate improvement of lipids. There are no compliance problems.  Risk factors for coronary artery disease include dyslipidemia and hypertension.  Altered Mental Status This is a new problem. The current episode started more than 1 year ago. The problem occurs intermittently. The problem has been gradually improving. Pertinent negatives include no abdominal pain, anorexia, chest pain, chills, coughing, fatigue, fever, headaches, myalgias, nausea, neck pain, rash or sore throat. The treatment provided moderate relief.  Insomnia Primary symptoms: difficulty falling asleep.   The problem has been waxing and waning since  onset. The symptoms are aggravated by SSRI use. PMH includes: depression.   Thyroid Problem Presents for follow-up visit. Patient reports no anxiety, constipation, diarrhea, fatigue or palpitations. The symptoms have been stable. Her past medical history is significant for hyperlipidemia. There is no history of diabetes.   Lab Results  Component Value Date   CREATININE 0.96 04/26/2020   BUN 17 04/26/2020   NA 146 (H) 04/26/2020   K 4.7 04/26/2020   CL 108 (H) 04/26/2020   CO2 22 04/26/2020   Lab Results  Component Value Date   CHOL 182 04/26/2020   HDL 53 04/26/2020   LDLCALC 105 (H) 04/26/2020   TRIG 138 04/26/2020   CHOLHDL 2.3 05/26/2019   Lab Results  Component Value Date   TSH 3.860 07/17/2020   Lab Results  Component Value Date   HGBA1C 5.4 01/01/2013   Lab Results  Component Value Date   WBC 5.2 07/17/2020   HGB 13.6 07/17/2020   HCT 40.7 07/17/2020   MCV 92 07/17/2020   PLT 177 07/17/2020   Lab Results  Component Value Date   ALT 8 04/26/2020   AST 23 04/26/2020   ALKPHOS 44 04/26/2020   BILITOT 0.8 04/26/2020     Review of Systems  Constitutional:  Negative for appetite change, chills, fatigue and fever.  HENT:  Negative for drooling, ear discharge, ear pain and sore throat.   Respiratory:  Negative for cough, shortness of breath and wheezing.   Cardiovascular:  Negative for chest pain, palpitations and leg swelling.  Gastrointestinal:  Negative for abdominal pain, anorexia, blood in stool, constipation, diarrhea and nausea.  Endocrine: Negative for polydipsia.  Genitourinary:  Negative for dysuria, frequency, hematuria and urgency.  Musculoskeletal:  Negative for back pain, myalgias and neck  pain.  Skin:  Negative for rash.  Allergic/Immunologic: Negative for environmental allergies.  Neurological:  Negative for dizziness, focal weakness and headaches.  Hematological:  Does not bruise/bleed easily.  Psychiatric/Behavioral:  Positive for depression.  Negative for decreased concentration and suicidal ideas. The patient has insomnia. The patient is not nervous/anxious.    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   Arthritis 06/06/2014   Aggrieved 06/06/2014   Dermatitis, eczematoid 06/06/2014   Recurrent major depressive episodes ([place]) 06/06/2014   Essential (primary) hypertension 06/06/2014   Gastro-esophageal reflux disease without esophagitis 06/06/2014   Cannot sleep 06/06/2014   Menopause 06/06/2014    No Known Allergies  Past Surgical History:  Procedure Laterality Date   ABDOMINAL HYSTERECTOMY     APPLICATION OF WOUND VAC  11/01/2019   Procedure: APPLICATION OF WOUND VAC;  Surgeon: Herbert Pun, MD;  Location: [facility];  Service: General;;   BREAST BIOPSY Right 06/25/2017   ductal carcinoma in situ involving a papilloma and encapsulated papillary carcinoma   BREAST BIOPSY Left 10/12/2019   affirm bx of calcs, x marker, path pending   BREAST BIOPSY Left 2006   DCIS   BREAST LUMPECTOMY Right 07/09/2017   DCIS, clear margins   BREAST LUMPECTOMY Left 2006   DCIS   BREAST LUMPECTOMY WITH NEEDLE LOCALIZATION Right 07/09/2017   Procedure: BREAST LUMPECTOMY WITH NEEDLE LOCALIZATION;  Surgeon: Herbert Pun, MD;  Location: [facility];  Service: General;  Laterality: Right;   BREAST RECONSTRUCTION WITH PLACEMENT OF TISSUE EXPANDER AND FLEX HD (ACELLULAR HYDRATED DERMIS) Bilateral 11/01/2019   Procedure: BREAST RECONSTRUCTION WITH  PLACEMENT OF TISSUE EXPANDER AND FLEX HD (ACELLULAR HYDRATED DERMIS);  Surgeon: Cindra Presume, MD;  Location: [facility];  Service: Plastics;  Laterality: Bilateral;   BREAST SURGERY     left breast lumpectomy   CARDIOVERSION N/A 03/21/2016   Procedure: Cardioversion;  Surgeon: Corey Skains, MD;  Location: [facility];  Service: Cardiovascular;  Laterality: N/A;   CATARACT EXTRACTION, BILATERAL     DEBRIDEMENT AND CLOSURE WOUND Bilateral 11/26/2019   Procedure: Excision of bilateral mastectomy flap necrosis with complex closure;  Surgeon: Cindra Presume, MD;  Location: [place];  Service: Plastics;  Laterality: Bilateral;  1.5 hours total   LAPAROSCOPIC CHOLECYSTECTOMY     TISSUE EXPANDER PLACEMENT Bilateral 11/26/2019   Procedure: exchange of tissue expanders;  Surgeon: Cindra Presume, MD;  Location: [place];  Service: Plastics;  Laterality: Bilateral;   TISSUE EXPANDER PLACEMENT Bilateral 02/01/2020   Procedure: removal of bilateral expander with closure;  Surgeon: Cindra Presume, MD;  Location: [place];  Service: Plastics;  Laterality: Bilateral;   TONSILLECTOMY     TOTAL MASTECTOMY Bilateral 11/01/2019   Procedure: TOTAL MASTECTOMY w/ immediate reconstruction and Sentinel Node Biopsy;  Surgeon: Herbert Pun, MD;  Location: [facility];  Service: General;  Laterality: Bilateral;   VAGINAL HYSTERECTOMY      Social History   Tobacco Use   Smoking status: Never   Smokeless tobacco: Never  Vaping Use   Vaping Use: Never used  Substance  Use Topics   Alcohol use: Never   Drug use: No     Medication list has been reviewed and updated.  Current Meds  Medication Sig   apixaban (ELIQUIS) 5 MG TABS tablet Take 5 mg by mouth 2 (two) times daily.    calcium-vitamin D (OSCAL WITH D) 500-200 MG-UNIT TABS tablet Take 1 tablet by mouth 2 (two) times daily.    flecainide (TAMBOCOR) 100 MG tablet Take 50 mg by mouth 2 (two) times daily.   furosemide (LASIX) 20 MG tablet Take 1 tablet by mouth  daily.   levothyroxine (SYNTHROID) 25 MCG tablet TAKE 1 TABLET BY MOUTH EVERY DAY   metoprolol tartrate (LOPRESSOR) 50 MG tablet Take 1 tablet by mouth in the morning and at bedtime. kowalski   pravastatin (PRAVACHOL) 20 MG tablet Take 1 tablet (20 mg total) by mouth daily.   sertraline (ZOLOFT) 100 MG tablet Take 1 tablet (100 mg total) by mouth daily.    PHQ 2/9 Scores 10/17/2020 07/14/2020 04/26/2020 03/03/2020  PHQ - 2 Score 0 1 0 0  PHQ- 9 Score 4 3 0 7    GAD 7 : Generalized Anxiety Score 10/17/2020 07/14/2020 04/26/2020 03/03/2020  Nervous, Anxious, on Edge 0 0 0 1  Control/stop worrying 0 0 1 2  Worry too much - different things 0 0 0 1  Trouble relaxing 0 0 0 0  Restless 0 0 0 0  Easily annoyed or irritable 1 0 0 2  Afraid - awful might happen 0 0 0 1  Total GAD 7 Score 1 0 1 7  Anxiety Difficulty Not difficult at all - Not difficult at all Not difficult at all    BP Readings from Last 3 Encounters:  10/17/20 128/70  07/14/20 110/62  06/09/20 119/65    Physical Exam Vitals and nursing note reviewed.  Constitutional:      General: She is irritable.     Appearance: She is well-developed.  HENT:     Head: Normocephalic.     Right Ear: Tympanic membrane, ear canal and external ear normal.     Left Ear: Tympanic membrane, ear canal and external ear normal.     Nose: Nose normal.  Eyes:     General: Lids are everted, no foreign bodies appreciated. No scleral icterus.       Left eye: No foreign body or hordeolum.     Conjunctiva/sclera: Conjunctivae normal.     Right eye: Right conjunctiva is not injected.     Left eye: Left conjunctiva is not injected.     Pupils: Pupils are equal, round, and reactive to light.  Neck:     Thyroid: No thyromegaly.     Vascular: No JVD.     Trachea: No tracheal deviation.  Cardiovascular:     Rate and Rhythm: Normal rate and regular rhythm.     Heart sounds: Normal heart sounds. No murmur heard.   No friction rub. No gallop.  Pulmonary:      Effort: Pulmonary effort is normal. No respiratory distress.     Breath sounds: Normal breath sounds. No wheezing, rhonchi or rales.  Abdominal:     General: Bowel sounds are normal.     Palpations: Abdomen is soft. There is no mass.     Tenderness: There is no abdominal tenderness. There is no guarding or rebound.  Musculoskeletal:        General: No tenderness. Normal range of motion.     Cervical back: Normal range of  motion and neck supple.  Lymphadenopathy:     Cervical: No cervical adenopathy.  Skin:    General: Skin is warm.     Findings: No rash.  Neurological:     Mental Status: She is alert and oriented to person, place, and time.     Cranial Nerves: No cranial nerve deficit.     Deep Tendon Reflexes: Reflexes normal.  Psychiatric:        Mood and Affect: Mood is not anxious or depressed.    Wt Readings from Last 3 Encounters:  10/17/20 201 lb (91.2 kg)  07/14/20 192 lb (87.1 kg)  04/26/20 195 lb (88.5 kg)    BP 128/70   Pulse 68   Ht 5\' 5"  (1.651 m)   Wt 201 lb (91.2 kg)   BMI 33.45 kg/m   Assessment and Plan:  1. Degenerative lumbar disc chronic.  Controlled.  Stable.  Patient is currently taking Os-Cal and vitamin D for prevention of compression fractures.   2. Facet arthritis of lumbar region As noted above patient also is taking Tylenol as needed basis  3. Hypothyroidism, unspecified type Chronic.  Controlled.  Stable.  Continue levothyroxine 25 mcg daily. - levothyroxine (SYNTHROID) 25 MCG tablet; Take 1 tablet (25 mcg total) by mouth daily.  Dispense: 90 tablet; Refill: 1  4. Dyslipidemia Chronic.  Controlled.  Stable.  Continue Pravachol 20 mg once a day. - pravastatin (PRAVACHOL) 20 MG tablet; Take 1 tablet (20 mg total) by mouth daily.  Dispense: 90 tablet; Refill: 1  5. Familial multiple lipoprotein-type hyperlipidemia As noted above. - pravastatin (PRAVACHOL) 20 MG tablet; Take 1 tablet (20 mg total) by mouth daily.  Dispense: 90 tablet;  Refill: 1  6. Recurrent major depressive episodes (HCC) Chronic.  Controlled.  Stable.  PHQ was 4 with a gad score of 1 which is significantly improved.  We will continue sertraline at 100 mg/day. - sertraline (ZOLOFT) 100 MG tablet; Take 1 tablet (100 mg total) by mouth daily.  Dispense: 90 tablet; Refill: 1  7. Cognitive change Relatively new recognition probably onset for greater than a year.  Has been gradual in nature.  Patient does have concerns about her ability to remember words but is not a true aphasia.  We will refer to neurology for evaluation of cognitive concerns. - Ambulatory referral to Neurology

## 2020-10-18 ENCOUNTER — Telehealth: Payer: Self-pay

## 2020-10-18 ENCOUNTER — Other Ambulatory Visit: Payer: Self-pay

## 2020-10-18 DIAGNOSIS — G4701 Insomnia due to medical condition: Secondary | ICD-10-CM

## 2020-10-18 NOTE — Telephone Encounter (Signed)
Copied from [place] [id number]. Topic: General - Other >> Oct 18, 2020 10:50 AM Bayard Beaver wrote: Reason for CRM: Patient called about medication to help her sleep that she said Dr Ronnald Ramp was gonna send over. Please call back.. She doesn't know the name of it

## 2020-10-19 DIAGNOSIS — R413 Other amnesia: Secondary | ICD-10-CM | POA: Diagnosis not present

## 2020-10-19 DIAGNOSIS — G4710 Hypersomnia, unspecified: Secondary | ICD-10-CM | POA: Diagnosis not present

## 2020-10-19 DIAGNOSIS — G3184 Mild cognitive impairment, so stated: Secondary | ICD-10-CM | POA: Diagnosis not present

## 2020-10-19 DIAGNOSIS — Z86000 Personal history of in-situ neoplasm of breast: Secondary | ICD-10-CM | POA: Diagnosis not present

## 2020-10-19 DIAGNOSIS — E669 Obesity, unspecified: Secondary | ICD-10-CM | POA: Diagnosis not present

## 2020-10-19 DIAGNOSIS — R0683 Snoring: Secondary | ICD-10-CM | POA: Diagnosis not present

## 2020-10-20 DIAGNOSIS — I480 Paroxysmal atrial fibrillation: Secondary | ICD-10-CM | POA: Diagnosis not present

## 2020-10-20 DIAGNOSIS — I420 Dilated cardiomyopathy: Secondary | ICD-10-CM | POA: Diagnosis not present

## 2020-10-20 DIAGNOSIS — N179 Acute kidney failure, unspecified: Secondary | ICD-10-CM | POA: Diagnosis not present

## 2020-10-20 DIAGNOSIS — E43 Unspecified severe protein-calorie malnutrition: Secondary | ICD-10-CM | POA: Diagnosis not present

## 2020-10-20 DIAGNOSIS — T8189XD Other complications of procedures, not elsewhere classified, subsequent encounter: Secondary | ICD-10-CM | POA: Diagnosis not present

## 2020-10-20 DIAGNOSIS — M7989 Other specified soft tissue disorders: Secondary | ICD-10-CM | POA: Diagnosis not present

## 2020-10-23 DIAGNOSIS — I480 Paroxysmal atrial fibrillation: Secondary | ICD-10-CM | POA: Diagnosis not present

## 2020-10-23 DIAGNOSIS — E43 Unspecified severe protein-calorie malnutrition: Secondary | ICD-10-CM | POA: Diagnosis not present

## 2020-10-23 DIAGNOSIS — N179 Acute kidney failure, unspecified: Secondary | ICD-10-CM | POA: Diagnosis not present

## 2020-10-23 DIAGNOSIS — M7989 Other specified soft tissue disorders: Secondary | ICD-10-CM | POA: Diagnosis not present

## 2020-10-23 DIAGNOSIS — T8189XD Other complications of procedures, not elsewhere classified, subsequent encounter: Secondary | ICD-10-CM | POA: Diagnosis not present

## 2020-10-23 DIAGNOSIS — I420 Dilated cardiomyopathy: Secondary | ICD-10-CM | POA: Diagnosis not present

## 2020-10-24 ENCOUNTER — Ambulatory Visit: Payer: Self-pay | Admitting: *Deleted

## 2020-10-24 NOTE — Telephone Encounter (Signed)
Patient is calling to report she is having pain in right side that travels into back and hip- patient does not recall moving such- that it would pull something- but she is using a walker to ambulate at times. Patient advised her provider is not in office- she states she was in last week and now remembers that- but she is requesting a muscle relaxer for the pain. Patient advised she may need appointment for this- but she does not feel like the pain of getting into car and coming in for appointment. Advised I would send her message- but she may be advised appointment/UC.

## 2020-10-24 NOTE — Telephone Encounter (Signed)
Pt called stating that she has been waking up x3 days and is in pain in her right side. She states that she has not seen any improvement and is having a hard time walking. Pt states that she has been trying to tylenol and it is not helping.  Pt is requesting to speak with nurse. Please advise.  Reason for Disposition  [1] SEVERE pain (e.g., excruciating, unable to do any normal activities) AND [2] not improved 2 hours after pain medicine  Answer Assessment - Initial Assessment Questions 1. LOCATION: "Where does it hurt?"      Upper Right side to hip 2. RADIATION: "Does the pain shoot anywhere else?" (e.g., chest, back)     Lower back and hip 3. ONSET: "When did the pain begin?" (e.g., minutes, hours or days ago)      yesterday 4. SUDDEN: "Gradual or sudden onset?"     sudden 5. PATTERN "Does the pain come and go, or is it constant?"    - If constant: "Is it getting better, staying the same, or worsening?"      (Note: Constant means the pain never goes away completely; most serious pain is constant and it progresses)     - If intermittent: "How long does it last?" "Do you have pain now?"     (Note: Intermittent means the pain goes away completely between bouts)     Constant if up- sitting/heating pad helps 6. SEVERITY: "How bad is the pain?"  (e.g., Scale 1-10; mild, moderate, or severe)   - MILD (1-3): doesn't interfere with normal activities, abdomen soft and not tender to touch    - MODERATE (4-7): interferes with normal activities or awakens from sleep, abdomen tender to touch    - SEVERE (8-10): excruciating pain, doubled over, unable to do any normal activities      Sitting- mild/moderate, up-severe 7. RECURRENT SYMPTOM: "Have you ever had this type of stomach pain before?" If Yes, ask: "When was the last time?" and "What happened that time?"      no 8. CAUSE: "What do you think is causing the stomach pain?"     unsure 9. RELIEVING/AGGRAVATING FACTORS: "What makes it better or  worse?" (e.g., movement, antacids, bowel movement)     Heat helps 10. OTHER SYMPTOMS: "Do you have any other symptoms?" (e.g., back pain, diarrhea, fever, urination pain, vomiting)       Back pain 11. PREGNANCY: "Is there any chance you are pregnant?" "When was your last menstrual period?"       N/a  Answer Assessment - Initial Assessment Questions 1. ONSET: "When did the muscle aches or body pains start?"      Right side and into hip and back 2. LOCATION: "What part of your body is hurting?" (e.g., entire body, arms, legs)      Right side and back/hip 3. SEVERITY: "How bad is the pain?" (Scale 1-10; or mild, moderate, severe)   - MILD (1-3): doesn't interfere with normal activities    - MODERATE (4-7): interferes with normal activities or awakens from sleep    - SEVERE (8-10):  excruciating pain, unable to do any normal activities      severe 4. CAUSE: "What do you think is causing the pains?"     unsure 5. FEVER: "Have you been having fever?"     no 6. OTHER SYMPTOMS: "Do you have any other symptoms?" (e.g., chest pain, weakness, rash, cold or flu symptoms, weight loss)     No 7.  PREGNANCY: "Is there any chance you are pregnant?" "When was your last menstrual period?"     N/a 8. TRAVEL: "Have you traveled out of the country in the last month?" (e.g., travel history, exposures)     no  Protocols used: Abdominal Pain - Female-A-AH, Muscle Aches and Body Pain-A-AH

## 2020-10-25 NOTE — Telephone Encounter (Signed)
Called pts home phone left VM to call back.  [facility] nurse may give results to patient if they return call to clinic, a CRM has been created.  KP

## 2020-10-26 NOTE — Telephone Encounter (Signed)
For your information  

## 2020-10-26 NOTE — Telephone Encounter (Signed)
Patient called and advised she will need an in-person visit as noted by Dr. Zigmund Daniel below, she says she would like to be scheduled with Dr. Ronnald Ramp when she is back in the office. Appointment scheduled for Tuesday 10/31/20 at 1100 with Dr. Ronnald Ramp.

## 2020-10-27 DIAGNOSIS — N179 Acute kidney failure, unspecified: Secondary | ICD-10-CM | POA: Diagnosis not present

## 2020-10-27 DIAGNOSIS — I420 Dilated cardiomyopathy: Secondary | ICD-10-CM | POA: Diagnosis not present

## 2020-10-27 DIAGNOSIS — E43 Unspecified severe protein-calorie malnutrition: Secondary | ICD-10-CM | POA: Diagnosis not present

## 2020-10-27 DIAGNOSIS — M7989 Other specified soft tissue disorders: Secondary | ICD-10-CM | POA: Diagnosis not present

## 2020-10-27 DIAGNOSIS — I480 Paroxysmal atrial fibrillation: Secondary | ICD-10-CM | POA: Diagnosis not present

## 2020-10-27 DIAGNOSIS — T8189XD Other complications of procedures, not elsewhere classified, subsequent encounter: Secondary | ICD-10-CM | POA: Diagnosis not present

## 2020-10-29 ENCOUNTER — Other Ambulatory Visit: Payer: Self-pay | Admitting: Family Medicine

## 2020-10-29 DIAGNOSIS — I10 Essential (primary) hypertension: Secondary | ICD-10-CM

## 2020-10-29 DIAGNOSIS — I420 Dilated cardiomyopathy: Secondary | ICD-10-CM

## 2020-10-30 DIAGNOSIS — E43 Unspecified severe protein-calorie malnutrition: Secondary | ICD-10-CM | POA: Diagnosis not present

## 2020-10-30 DIAGNOSIS — N179 Acute kidney failure, unspecified: Secondary | ICD-10-CM | POA: Diagnosis not present

## 2020-10-30 DIAGNOSIS — T8189XD Other complications of procedures, not elsewhere classified, subsequent encounter: Secondary | ICD-10-CM | POA: Diagnosis not present

## 2020-10-30 DIAGNOSIS — I420 Dilated cardiomyopathy: Secondary | ICD-10-CM | POA: Diagnosis not present

## 2020-10-30 DIAGNOSIS — M7989 Other specified soft tissue disorders: Secondary | ICD-10-CM | POA: Diagnosis not present

## 2020-10-30 DIAGNOSIS — I480 Paroxysmal atrial fibrillation: Secondary | ICD-10-CM | POA: Diagnosis not present

## 2020-10-31 ENCOUNTER — Encounter: Payer: Self-pay | Admitting: Family Medicine

## 2020-10-31 ENCOUNTER — Ambulatory Visit (INDEPENDENT_AMBULATORY_CARE_PROVIDER_SITE_OTHER): Payer: Medicare Other | Admitting: Family Medicine

## 2020-10-31 ENCOUNTER — Other Ambulatory Visit: Payer: Self-pay

## 2020-10-31 VITALS — BP 120/60 | HR 60 | Ht 65.0 in | Wt 202.0 lb

## 2020-10-31 DIAGNOSIS — E43 Unspecified severe protein-calorie malnutrition: Secondary | ICD-10-CM | POA: Diagnosis not present

## 2020-10-31 DIAGNOSIS — M47816 Spondylosis without myelopathy or radiculopathy, lumbar region: Secondary | ICD-10-CM | POA: Diagnosis not present

## 2020-10-31 DIAGNOSIS — M5136 Other intervertebral disc degeneration, lumbar region: Secondary | ICD-10-CM | POA: Diagnosis not present

## 2020-10-31 DIAGNOSIS — M542 Cervicalgia: Secondary | ICD-10-CM | POA: Diagnosis not present

## 2020-10-31 DIAGNOSIS — M7989 Other specified soft tissue disorders: Secondary | ICD-10-CM | POA: Diagnosis not present

## 2020-10-31 DIAGNOSIS — I420 Dilated cardiomyopathy: Secondary | ICD-10-CM | POA: Diagnosis not present

## 2020-10-31 DIAGNOSIS — T8189XD Other complications of procedures, not elsewhere classified, subsequent encounter: Secondary | ICD-10-CM | POA: Diagnosis not present

## 2020-10-31 DIAGNOSIS — N179 Acute kidney failure, unspecified: Secondary | ICD-10-CM | POA: Diagnosis not present

## 2020-10-31 DIAGNOSIS — I480 Paroxysmal atrial fibrillation: Secondary | ICD-10-CM | POA: Diagnosis not present

## 2020-10-31 MED ORDER — CELECOXIB 200 MG PO CAPS: 200.0000 mg | ORAL_CAPSULE | Freq: Every day | ORAL | 5 refills | Status: DC

## 2020-10-31 MED ORDER — GABAPENTIN 100 MG PO CAPS: 100.0000 mg | ORAL_CAPSULE | Freq: Every day | ORAL | 5 refills | Status: DC

## 2020-10-31 NOTE — Progress Notes (Signed)
Date:  10/31/2020   Name:  Melissa Chase   DOB:  10-01-1940   MRN:  [id number]   Chief Complaint: Arthritis ("Hurting all over"/ can't get out of bed by herself)  Arthritis Presents for initial visit. The disease course has been worsening. She complains of pain and stiffness. She reports no joint swelling or joint warmth. Affected locations include the neck, left wrist, left knee and right knee. Associated symptoms include pain at night and pain while resting. Pertinent negatives include no diarrhea, dry eyes, dry mouth, dysuria, fatigue, fever, rash, Raynaud's syndrome, uveitis or weight loss. Her past medical history is significant for osteoarthritis. Past treatments include acetaminophen. The treatment provided no relief. Factors aggravating her arthritis include activity, entering/exiting a vehicle and sitting. Compliance with prior treatments has been good.   Lab Results  Component Value Date   CREATININE 0.96 04/26/2020   BUN 17 04/26/2020   NA 146 (H) 04/26/2020   K 4.7 04/26/2020   CL 108 (H) 04/26/2020   CO2 22 04/26/2020   Lab Results  Component Value Date   CHOL 182 04/26/2020   HDL 53 04/26/2020   LDLCALC 105 (H) 04/26/2020   TRIG 138 04/26/2020   CHOLHDL 2.3 05/26/2019   Lab Results  Component Value Date   TSH 3.860 07/17/2020   Lab Results  Component Value Date   HGBA1C 5.4 01/01/2013   Lab Results  Component Value Date   WBC 5.2 07/17/2020   HGB 13.6 07/17/2020   HCT 40.7 07/17/2020   MCV 92 07/17/2020   PLT 177 07/17/2020   Lab Results  Component Value Date   ALT 8 04/26/2020   AST 23 04/26/2020   ALKPHOS 44 04/26/2020   BILITOT 0.8 04/26/2020     Review of Systems  Constitutional:  Negative for fatigue, fever and weight loss.  Gastrointestinal:  Negative for diarrhea.  Genitourinary:  Negative for dysuria.  Musculoskeletal:  Positive for arthralgias, arthritis, back pain, neck pain and stiffness. Negative for joint swelling.  Skin:   Negative for rash.  Neurological:  Negative for speech difficulty.   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   Arthritis 06/06/2014   Aggrieved 06/06/2014   Dermatitis, eczematoid 06/06/2014   Recurrent major depressive episodes ([place]) 06/06/2014   Essential (primary) hypertension 06/06/2014   Gastro-esophageal reflux disease without esophagitis 06/06/2014   Cannot sleep 06/06/2014   Menopause 06/06/2014    No Known Allergies  Past Surgical History:  Procedure Laterality Date   ABDOMINAL HYSTERECTOMY     APPLICATION OF WOUND VAC  11/01/2019   Procedure: APPLICATION OF WOUND VAC;  Surgeon: Herbert Pun, MD;  Location: [facility];  Service: General;;   BREAST BIOPSY Right 06/25/2017   ductal carcinoma in situ involving a papilloma and encapsulated papillary carcinoma   BREAST BIOPSY Left 10/12/2019   affirm bx of calcs, x marker, path pending   BREAST BIOPSY Left 2006   DCIS   BREAST LUMPECTOMY Right 07/09/2017   DCIS, clear margins   BREAST LUMPECTOMY Left 2006   DCIS  BREAST LUMPECTOMY WITH NEEDLE LOCALIZATION Right 07/09/2017   Procedure: BREAST LUMPECTOMY WITH NEEDLE LOCALIZATION;  Surgeon: Herbert Pun, MD;  Location: [facility];  Service: General;  Laterality: Right;   BREAST RECONSTRUCTION WITH PLACEMENT OF TISSUE EXPANDER AND FLEX HD (ACELLULAR HYDRATED DERMIS) Bilateral 11/01/2019   Procedure: BREAST RECONSTRUCTION WITH PLACEMENT OF TISSUE EXPANDER AND FLEX HD (ACELLULAR HYDRATED  DERMIS);  Surgeon: Cindra Presume, MD;  Location: [facility];  Service: Plastics;  Laterality: Bilateral;   BREAST SURGERY     left breast lumpectomy   CARDIOVERSION N/A 03/21/2016   Procedure: Cardioversion;  Surgeon: Corey Skains, MD;  Location: [facility];  Service: Cardiovascular;  Laterality: N/A;   CATARACT EXTRACTION, BILATERAL     DEBRIDEMENT AND CLOSURE WOUND Bilateral 11/26/2019   Procedure: Excision of bilateral mastectomy flap necrosis with complex closure;  Surgeon: Cindra Presume, MD;  Location: [place];  Service: Plastics;  Laterality: Bilateral;  1.5 hours total   LAPAROSCOPIC CHOLECYSTECTOMY     TISSUE EXPANDER PLACEMENT Bilateral 11/26/2019   Procedure: exchange of tissue expanders;  Surgeon: Cindra Presume, MD;  Location: [place];  Service: Plastics;  Laterality: Bilateral;   TISSUE EXPANDER PLACEMENT Bilateral 02/01/2020   Procedure: removal of bilateral expander with closure;  Surgeon: Cindra Presume, MD;  Location: [place];  Service: Plastics;  Laterality: Bilateral;   TONSILLECTOMY     TOTAL MASTECTOMY Bilateral 11/01/2019   Procedure: TOTAL MASTECTOMY w/ immediate reconstruction and Sentinel Node Biopsy;  Surgeon: Herbert Pun, MD;  Location: [facility];  Service: General;  Laterality: Bilateral;   VAGINAL HYSTERECTOMY      Social History   Tobacco Use   Smoking status: Never   Smokeless tobacco: Never  Vaping Use   Vaping Use: Never used  Substance Use Topics   Alcohol use: Never   Drug use: No     Medication list has been reviewed and updated.  Current Meds  Medication Sig   apixaban (ELIQUIS) 5 MG TABS tablet Take 5 mg by mouth 2 (two) times daily.    calcium-vitamin D (OSCAL WITH D) 500-200 MG-UNIT TABS tablet Take 1 tablet by mouth 2 (two) times daily.    flecainide (TAMBOCOR) 100 MG tablet Take 50 mg by mouth 2 (two) times daily.   furosemide (LASIX) 20 MG tablet Take 1 tablet by mouth daily.   hydrochlorothiazide (MICROZIDE) 12.5 MG capsule TAKE  1 CAPSULE BY MOUTH EVERY DAY   levothyroxine (SYNTHROID) 25 MCG tablet Take 1 tablet (25 mcg total) by mouth daily.   metoprolol tartrate (LOPRESSOR) 50 MG tablet Take 1 tablet by mouth in the morning and at bedtime. kowalski   pravastatin (PRAVACHOL) 20 MG tablet Take 1 tablet (20 mg total) by mouth daily.   sertraline (ZOLOFT) 100 MG tablet Take 1 tablet (100 mg total) by mouth daily.    PHQ 2/9 Scores 10/17/2020 07/14/2020 04/26/2020 03/03/2020  PHQ - 2 Score 0 1 0 0  PHQ- 9 Score 4 3 0 7    GAD 7 : Generalized Anxiety Score 10/17/2020 07/14/2020 04/26/2020 03/03/2020  Nervous, Anxious, on Edge 0 0 0 1  Control/stop worrying 0 0 1 2  Worry too much - different things 0 0 0 1  Trouble relaxing 0 0 0 0  Restless 0 0 0 0  Easily annoyed or irritable 1 0 0 2  Afraid - awful might happen 0 0 0 1  Total GAD 7 Score 1 0 1 7  Anxiety Difficulty Not difficult at all -  Not difficult at all Not difficult at all    BP Readings from Last 3 Encounters:  10/17/20 128/70  07/14/20 110/62  06/09/20 119/65    Physical Exam Vitals and nursing note reviewed.  Constitutional:      Appearance: She is well-developed.  HENT:     Head: Normocephalic.     Right Ear: Tympanic membrane and external ear normal.     Left Ear: Tympanic membrane and external ear normal.     Nose: Nose normal.  Eyes:     General: Lids are everted, no foreign bodies appreciated. No scleral icterus.       Left eye: No foreign body or hordeolum.     Conjunctiva/sclera: Conjunctivae normal.     Right eye: Right conjunctiva is not injected.     Left eye: Left conjunctiva is not injected.     Pupils: Pupils are equal, round, and reactive to light.  Neck:     Thyroid: No thyromegaly.     Vascular: No JVD.     Trachea: No tracheal deviation.  Cardiovascular:     Rate and Rhythm: Normal rate and regular rhythm.     Heart sounds: Normal heart sounds. No murmur heard.   No friction rub. No gallop.  Pulmonary:     Effort:  Pulmonary effort is normal. No respiratory distress.     Breath sounds: Normal breath sounds. No wheezing or rales.  Abdominal:     General: Bowel sounds are normal.     Palpations: Abdomen is soft. There is no mass.     Tenderness: There is no abdominal tenderness. There is no guarding or rebound.  Musculoskeletal:        General: No swelling or tenderness.     Cervical back: Neck supple. Pain with movement present. Decreased range of motion.     Lumbar back: Decreased range of motion. Negative right straight leg raise test and negative left straight leg raise test.  Lymphadenopathy:     Cervical: No cervical adenopathy.  Skin:    General: Skin is warm.     Findings: No rash.  Neurological:     Mental Status: She is alert and oriented to person, place, and time.     Cranial Nerves: No cranial nerve deficit.     Deep Tendon Reflexes: Reflexes normal.  Psychiatric:        Mood and Affect: Mood is not anxious or depressed.    Wt Readings from Last 3 Encounters:  10/31/20 202 lb (91.6 kg)  10/17/20 201 lb (91.2 kg)  07/14/20 192 lb (87.1 kg)    Ht 5\' 5"  (1.651 m)   Wt 202 lb (91.6 kg)   BMI 33.61 kg/m   Assessment and Plan:  1. Degenerative lumbar disc Chronic.  Controlled.  Stable.  This is been described as severe degenerative changes in the lower back.  Patient is currently unable to sleep due to the back pain and did not have relief at 100 mg Celebrex.  We will go to 200 mg in the morning followed by gabapentin 100 mg at night to see if this regimen may allow some relief of pain at night. - celecoxib (CELEBREX) 200 MG capsule; Take 1 capsule (200 mg total) by mouth at bedtime.  Dispense: 30 capsule; Refill: 5 - gabapentin (NEURONTIN) 100 MG capsule; Take 1 capsule (100 mg total) by mouth at bedtime.  Dispense: 30 capsule; Refill: 5  2. Facet arthritis of lumbar region As noted above - celecoxib (CELEBREX) 200  MG capsule; Take 1 capsule (200 mg total) by mouth at bedtime.   Dispense: 30 capsule; Refill: 5 - gabapentin (NEURONTIN) 100 MG capsule; Take 1 capsule (100 mg total) by mouth at bedtime.  Dispense: 30 capsule; Refill: 5  3. Neck pain She also has discomfort of her neck but review of the CT scan that was done of her cervical spine did not see significant degenerative changes but this would be likely helped by the Celebrex and Neurontin combination. - celecoxib (CELEBREX) 200 MG capsule; Take 1 capsule (200 mg total) by mouth at bedtime.  Dispense: 30 capsule; Refill: 5 - gabapentin (NEURONTIN) 100 MG capsule; Take 1 capsule (100 mg total) by mouth at bedtime.  Dispense: 30 capsule; Refill: 5

## 2020-11-02 DIAGNOSIS — R413 Other amnesia: Secondary | ICD-10-CM | POA: Diagnosis not present

## 2020-11-03 DIAGNOSIS — N179 Acute kidney failure, unspecified: Secondary | ICD-10-CM | POA: Diagnosis not present

## 2020-11-03 DIAGNOSIS — M7989 Other specified soft tissue disorders: Secondary | ICD-10-CM | POA: Diagnosis not present

## 2020-11-03 DIAGNOSIS — T8189XD Other complications of procedures, not elsewhere classified, subsequent encounter: Secondary | ICD-10-CM | POA: Diagnosis not present

## 2020-11-03 DIAGNOSIS — I480 Paroxysmal atrial fibrillation: Secondary | ICD-10-CM | POA: Diagnosis not present

## 2020-11-03 DIAGNOSIS — E43 Unspecified severe protein-calorie malnutrition: Secondary | ICD-10-CM | POA: Diagnosis not present

## 2020-11-03 DIAGNOSIS — I420 Dilated cardiomyopathy: Secondary | ICD-10-CM | POA: Diagnosis not present

## 2020-11-06 DIAGNOSIS — M7989 Other specified soft tissue disorders: Secondary | ICD-10-CM | POA: Diagnosis not present

## 2020-11-06 DIAGNOSIS — N179 Acute kidney failure, unspecified: Secondary | ICD-10-CM | POA: Diagnosis not present

## 2020-11-06 DIAGNOSIS — I420 Dilated cardiomyopathy: Secondary | ICD-10-CM | POA: Diagnosis not present

## 2020-11-06 DIAGNOSIS — I480 Paroxysmal atrial fibrillation: Secondary | ICD-10-CM | POA: Diagnosis not present

## 2020-11-06 DIAGNOSIS — T8189XD Other complications of procedures, not elsewhere classified, subsequent encounter: Secondary | ICD-10-CM | POA: Diagnosis not present

## 2020-11-06 DIAGNOSIS — E43 Unspecified severe protein-calorie malnutrition: Secondary | ICD-10-CM | POA: Diagnosis not present

## 2020-11-10 DIAGNOSIS — N179 Acute kidney failure, unspecified: Secondary | ICD-10-CM | POA: Diagnosis not present

## 2020-11-10 DIAGNOSIS — I420 Dilated cardiomyopathy: Secondary | ICD-10-CM | POA: Diagnosis not present

## 2020-11-10 DIAGNOSIS — E43 Unspecified severe protein-calorie malnutrition: Secondary | ICD-10-CM | POA: Diagnosis not present

## 2020-11-10 DIAGNOSIS — M7989 Other specified soft tissue disorders: Secondary | ICD-10-CM | POA: Diagnosis not present

## 2020-11-10 DIAGNOSIS — T8189XD Other complications of procedures, not elsewhere classified, subsequent encounter: Secondary | ICD-10-CM | POA: Diagnosis not present

## 2020-11-10 DIAGNOSIS — I480 Paroxysmal atrial fibrillation: Secondary | ICD-10-CM | POA: Diagnosis not present

## 2020-11-13 DIAGNOSIS — N179 Acute kidney failure, unspecified: Secondary | ICD-10-CM | POA: Diagnosis not present

## 2020-11-13 DIAGNOSIS — T8189XD Other complications of procedures, not elsewhere classified, subsequent encounter: Secondary | ICD-10-CM | POA: Diagnosis not present

## 2020-11-13 DIAGNOSIS — M7989 Other specified soft tissue disorders: Secondary | ICD-10-CM | POA: Diagnosis not present

## 2020-11-13 DIAGNOSIS — E43 Unspecified severe protein-calorie malnutrition: Secondary | ICD-10-CM | POA: Diagnosis not present

## 2020-11-13 DIAGNOSIS — I480 Paroxysmal atrial fibrillation: Secondary | ICD-10-CM | POA: Diagnosis not present

## 2020-11-13 DIAGNOSIS — I420 Dilated cardiomyopathy: Secondary | ICD-10-CM | POA: Diagnosis not present

## 2020-11-14 DIAGNOSIS — E46 Unspecified protein-calorie malnutrition: Secondary | ICD-10-CM | POA: Diagnosis not present

## 2020-11-14 DIAGNOSIS — N179 Acute kidney failure, unspecified: Secondary | ICD-10-CM | POA: Diagnosis not present

## 2020-11-14 DIAGNOSIS — Z791 Long term (current) use of non-steroidal anti-inflammatories (NSAID): Secondary | ICD-10-CM | POA: Diagnosis not present

## 2020-11-14 DIAGNOSIS — D509 Iron deficiency anemia, unspecified: Secondary | ICD-10-CM | POA: Diagnosis not present

## 2020-11-14 DIAGNOSIS — Z9013 Acquired absence of bilateral breasts and nipples: Secondary | ICD-10-CM | POA: Diagnosis not present

## 2020-11-14 DIAGNOSIS — R634 Abnormal weight loss: Secondary | ICD-10-CM | POA: Diagnosis not present

## 2020-11-14 DIAGNOSIS — Z6832 Body mass index (BMI) 32.0-32.9, adult: Secondary | ICD-10-CM | POA: Diagnosis not present

## 2020-11-14 DIAGNOSIS — I480 Paroxysmal atrial fibrillation: Secondary | ICD-10-CM | POA: Diagnosis not present

## 2020-11-14 DIAGNOSIS — Z79891 Long term (current) use of opiate analgesic: Secondary | ICD-10-CM | POA: Diagnosis not present

## 2020-11-14 DIAGNOSIS — T8189XD Other complications of procedures, not elsewhere classified, subsequent encounter: Secondary | ICD-10-CM | POA: Diagnosis not present

## 2020-11-14 DIAGNOSIS — Z7901 Long term (current) use of anticoagulants: Secondary | ICD-10-CM | POA: Diagnosis not present

## 2020-11-14 DIAGNOSIS — I420 Dilated cardiomyopathy: Secondary | ICD-10-CM | POA: Diagnosis not present

## 2020-11-14 DIAGNOSIS — M4696 Unspecified inflammatory spondylopathy, lumbar region: Secondary | ICD-10-CM | POA: Diagnosis not present

## 2020-11-14 DIAGNOSIS — E669 Obesity, unspecified: Secondary | ICD-10-CM | POA: Diagnosis not present

## 2020-11-14 DIAGNOSIS — S21102D Unspecified open wound of left front wall of thorax without penetration into thoracic cavity, subsequent encounter: Secondary | ICD-10-CM | POA: Diagnosis not present

## 2020-11-14 DIAGNOSIS — M7989 Other specified soft tissue disorders: Secondary | ICD-10-CM | POA: Diagnosis not present

## 2020-11-14 DIAGNOSIS — E43 Unspecified severe protein-calorie malnutrition: Secondary | ICD-10-CM | POA: Diagnosis not present

## 2020-11-14 DIAGNOSIS — Z853 Personal history of malignant neoplasm of breast: Secondary | ICD-10-CM | POA: Diagnosis not present

## 2020-11-14 DIAGNOSIS — Z9181 History of falling: Secondary | ICD-10-CM | POA: Diagnosis not present

## 2020-11-14 DIAGNOSIS — Z85828 Personal history of other malignant neoplasm of skin: Secondary | ICD-10-CM | POA: Diagnosis not present

## 2020-11-14 DIAGNOSIS — I10 Essential (primary) hypertension: Secondary | ICD-10-CM | POA: Diagnosis not present

## 2020-11-16 ENCOUNTER — Other Ambulatory Visit: Payer: Self-pay

## 2020-11-16 ENCOUNTER — Encounter: Payer: Self-pay | Admitting: Plastic Surgery

## 2020-11-16 ENCOUNTER — Ambulatory Visit (INDEPENDENT_AMBULATORY_CARE_PROVIDER_SITE_OTHER): Payer: Medicare Other | Admitting: Plastic Surgery

## 2020-11-16 ENCOUNTER — Telehealth: Payer: Self-pay

## 2020-11-16 DIAGNOSIS — S21002A Unspecified open wound of left breast, initial encounter: Secondary | ICD-10-CM | POA: Diagnosis not present

## 2020-11-16 NOTE — Progress Notes (Signed)
   Referring Provider Juline Patch, MD [address] [place],  [place] [address]   CC:  Chief Complaint  Patient presents with   Follow-up      Melissa Chase is an 80 y.o. female.  HPI: Patient presents for follow-up regarding a left chest wound.  We attempted tissue expander reconstruction after bilateral mastectomy with a history of radiation.  She did not heal well and ultimately required removal of both expanders.  She did have a persistent wound on the left side.  We elected to treat this conservatively and she is being seen at the [facility] wound center.  She is overall very happy with her care there and the progress that she is seen.  The wound is down to about a centimeter or so in size.  Review of Systems General: Denies fevers and chills  Physical Exam Vitals with BMI 10/31/2020 10/17/2020 07/14/2020  Height 5\' 5"  5\' 5"  5\' 5"   Weight 202 lbs 201 lbs 192 lbs  BMI 33.61 89.84 21.03  Systolic 128 118 867  Diastolic 60 70 62  Pulse 60 68 60    General:  No acute distress,  Alert and oriented, Non-Toxic, Normal speech and affect On exam the majority of her left chest is healed well.  There is about a centimeter open wound with packing in it.  No signs of infection.  Assessment/Plan Patient presents making great progress with the healing of her left chest wound.  She is getting continue with her care at the [facility] wound center for the time being.  I will plan to check her again in 6 months or so and I suspect she will be totally healed in that time.  All of her questions were answered.  Cindra Presume 11/16/2020, 1:51 PM

## 2020-11-16 NOTE — Telephone Encounter (Signed)
Faxed referral to [facility] for mastectomy supplies.

## 2020-11-17 DIAGNOSIS — I420 Dilated cardiomyopathy: Secondary | ICD-10-CM | POA: Diagnosis not present

## 2020-11-17 DIAGNOSIS — T8189XD Other complications of procedures, not elsewhere classified, subsequent encounter: Secondary | ICD-10-CM | POA: Diagnosis not present

## 2020-11-17 DIAGNOSIS — M7989 Other specified soft tissue disorders: Secondary | ICD-10-CM | POA: Diagnosis not present

## 2020-11-17 DIAGNOSIS — N179 Acute kidney failure, unspecified: Secondary | ICD-10-CM | POA: Diagnosis not present

## 2020-11-17 DIAGNOSIS — E43 Unspecified severe protein-calorie malnutrition: Secondary | ICD-10-CM | POA: Diagnosis not present

## 2020-11-17 DIAGNOSIS — I480 Paroxysmal atrial fibrillation: Secondary | ICD-10-CM | POA: Diagnosis not present

## 2020-11-20 DIAGNOSIS — T8189XD Other complications of procedures, not elsewhere classified, subsequent encounter: Secondary | ICD-10-CM | POA: Diagnosis not present

## 2020-11-20 DIAGNOSIS — E43 Unspecified severe protein-calorie malnutrition: Secondary | ICD-10-CM | POA: Diagnosis not present

## 2020-11-20 DIAGNOSIS — N179 Acute kidney failure, unspecified: Secondary | ICD-10-CM | POA: Diagnosis not present

## 2020-11-20 DIAGNOSIS — I420 Dilated cardiomyopathy: Secondary | ICD-10-CM | POA: Diagnosis not present

## 2020-11-20 DIAGNOSIS — M7989 Other specified soft tissue disorders: Secondary | ICD-10-CM | POA: Diagnosis not present

## 2020-11-20 DIAGNOSIS — I480 Paroxysmal atrial fibrillation: Secondary | ICD-10-CM | POA: Diagnosis not present

## 2020-11-24 DIAGNOSIS — I420 Dilated cardiomyopathy: Secondary | ICD-10-CM | POA: Diagnosis not present

## 2020-11-24 DIAGNOSIS — E43 Unspecified severe protein-calorie malnutrition: Secondary | ICD-10-CM | POA: Diagnosis not present

## 2020-11-24 DIAGNOSIS — T8189XD Other complications of procedures, not elsewhere classified, subsequent encounter: Secondary | ICD-10-CM | POA: Diagnosis not present

## 2020-11-24 DIAGNOSIS — M7989 Other specified soft tissue disorders: Secondary | ICD-10-CM | POA: Diagnosis not present

## 2020-11-24 DIAGNOSIS — I480 Paroxysmal atrial fibrillation: Secondary | ICD-10-CM | POA: Diagnosis not present

## 2020-11-24 DIAGNOSIS — N179 Acute kidney failure, unspecified: Secondary | ICD-10-CM | POA: Diagnosis not present

## 2020-11-27 DIAGNOSIS — I480 Paroxysmal atrial fibrillation: Secondary | ICD-10-CM | POA: Diagnosis not present

## 2020-11-27 DIAGNOSIS — E43 Unspecified severe protein-calorie malnutrition: Secondary | ICD-10-CM | POA: Diagnosis not present

## 2020-11-27 DIAGNOSIS — N179 Acute kidney failure, unspecified: Secondary | ICD-10-CM | POA: Diagnosis not present

## 2020-11-27 DIAGNOSIS — T8189XD Other complications of procedures, not elsewhere classified, subsequent encounter: Secondary | ICD-10-CM | POA: Diagnosis not present

## 2020-11-27 DIAGNOSIS — M7989 Other specified soft tissue disorders: Secondary | ICD-10-CM | POA: Diagnosis not present

## 2020-11-27 DIAGNOSIS — I420 Dilated cardiomyopathy: Secondary | ICD-10-CM | POA: Diagnosis not present

## 2020-12-01 DIAGNOSIS — I420 Dilated cardiomyopathy: Secondary | ICD-10-CM | POA: Diagnosis not present

## 2020-12-01 DIAGNOSIS — I480 Paroxysmal atrial fibrillation: Secondary | ICD-10-CM | POA: Diagnosis not present

## 2020-12-01 DIAGNOSIS — T8189XD Other complications of procedures, not elsewhere classified, subsequent encounter: Secondary | ICD-10-CM | POA: Diagnosis not present

## 2020-12-01 DIAGNOSIS — N179 Acute kidney failure, unspecified: Secondary | ICD-10-CM | POA: Diagnosis not present

## 2020-12-01 DIAGNOSIS — E43 Unspecified severe protein-calorie malnutrition: Secondary | ICD-10-CM | POA: Diagnosis not present

## 2020-12-01 DIAGNOSIS — M7989 Other specified soft tissue disorders: Secondary | ICD-10-CM | POA: Diagnosis not present

## 2020-12-04 DIAGNOSIS — E43 Unspecified severe protein-calorie malnutrition: Secondary | ICD-10-CM | POA: Diagnosis not present

## 2020-12-04 DIAGNOSIS — T8189XD Other complications of procedures, not elsewhere classified, subsequent encounter: Secondary | ICD-10-CM | POA: Diagnosis not present

## 2020-12-04 DIAGNOSIS — M7989 Other specified soft tissue disorders: Secondary | ICD-10-CM | POA: Diagnosis not present

## 2020-12-04 DIAGNOSIS — N179 Acute kidney failure, unspecified: Secondary | ICD-10-CM | POA: Diagnosis not present

## 2020-12-04 DIAGNOSIS — I480 Paroxysmal atrial fibrillation: Secondary | ICD-10-CM | POA: Diagnosis not present

## 2020-12-04 DIAGNOSIS — I420 Dilated cardiomyopathy: Secondary | ICD-10-CM | POA: Diagnosis not present

## 2020-12-05 DIAGNOSIS — T8189XD Other complications of procedures, not elsewhere classified, subsequent encounter: Secondary | ICD-10-CM | POA: Diagnosis not present

## 2020-12-05 DIAGNOSIS — Z923 Personal history of irradiation: Secondary | ICD-10-CM | POA: Diagnosis not present

## 2020-12-05 DIAGNOSIS — S21102D Unspecified open wound of left front wall of thorax without penetration into thoracic cavity, subsequent encounter: Secondary | ICD-10-CM | POA: Diagnosis not present

## 2020-12-08 DIAGNOSIS — N179 Acute kidney failure, unspecified: Secondary | ICD-10-CM | POA: Diagnosis not present

## 2020-12-08 DIAGNOSIS — I480 Paroxysmal atrial fibrillation: Secondary | ICD-10-CM | POA: Diagnosis not present

## 2020-12-08 DIAGNOSIS — M7989 Other specified soft tissue disorders: Secondary | ICD-10-CM | POA: Diagnosis not present

## 2020-12-08 DIAGNOSIS — I420 Dilated cardiomyopathy: Secondary | ICD-10-CM | POA: Diagnosis not present

## 2020-12-08 DIAGNOSIS — T8189XD Other complications of procedures, not elsewhere classified, subsequent encounter: Secondary | ICD-10-CM | POA: Diagnosis not present

## 2020-12-08 DIAGNOSIS — E43 Unspecified severe protein-calorie malnutrition: Secondary | ICD-10-CM | POA: Diagnosis not present

## 2020-12-12 DIAGNOSIS — N179 Acute kidney failure, unspecified: Secondary | ICD-10-CM | POA: Diagnosis not present

## 2020-12-12 DIAGNOSIS — T8189XD Other complications of procedures, not elsewhere classified, subsequent encounter: Secondary | ICD-10-CM | POA: Diagnosis not present

## 2020-12-12 DIAGNOSIS — I420 Dilated cardiomyopathy: Secondary | ICD-10-CM | POA: Diagnosis not present

## 2020-12-12 DIAGNOSIS — I480 Paroxysmal atrial fibrillation: Secondary | ICD-10-CM | POA: Diagnosis not present

## 2020-12-12 DIAGNOSIS — M7989 Other specified soft tissue disorders: Secondary | ICD-10-CM | POA: Diagnosis not present

## 2020-12-12 DIAGNOSIS — E43 Unspecified severe protein-calorie malnutrition: Secondary | ICD-10-CM | POA: Diagnosis not present

## 2020-12-20 ENCOUNTER — Ambulatory Visit (INDEPENDENT_AMBULATORY_CARE_PROVIDER_SITE_OTHER): Payer: Medicare Other

## 2020-12-20 DIAGNOSIS — Z0000 Encounter for general adult medical examination without abnormal findings: Secondary | ICD-10-CM | POA: Diagnosis not present

## 2020-12-20 NOTE — Patient Instructions (Signed)
Melissa Chase , Thank you for taking time to come for your Medicare Wellness Visit. I appreciate your ongoing commitment to your health goals. Please review the following plan we discussed and let me know if I can assist you in the future.   Screening recommendations/referrals: Colonoscopy: no longer required Bone Density: done 10/07/19 Recommended yearly ophthalmology/optometry visit for glaucoma screening and checkup Recommended yearly dental visit for hygiene and checkup  Vaccinations: Influenza vaccine: done 10/17/20 Pneumococcal vaccine: done 10/22/18; due for Pneumovax23 Tdap vaccine: due Shingles vaccine: Shingrix discussed. Please contact your pharmacy for coverage information.  Covid-19:declined  Advanced directives: Advance directive discussed with you today. I have provided a copy for you to complete at home and have notarized. Once this is complete please bring a copy in to our office so we can scan it into your chart.   Conditions/risks identified: Recommend continuing fall prevention in the home  Next appointment: Follow up in one year for your annual wellness visit    Preventive Care 65 Years and Older, Female Preventive care refers to lifestyle choices and visits with your health care provider that can promote health and wellness. What does preventive care include? A yearly physical exam. This is also called an annual well check. Dental exams once or twice a year. Routine eye exams. Ask your health care provider how often you should have your eyes checked. Personal lifestyle choices, including: Daily care of your teeth and gums. Regular physical activity. Eating a healthy diet. Avoiding tobacco and drug use. Limiting alcohol use. Practicing safe sex. Taking low-dose aspirin every day. Taking vitamin and mineral supplements as recommended by your health care provider. What happens during an annual well check? The services and screenings done by your health care provider  during your annual well check will depend on your age, overall health, lifestyle risk factors, and family history of disease. Counseling  Your health care provider may ask you questions about your: Alcohol use. Tobacco use. Drug use. Emotional well-being. Home and relationship well-being. Sexual activity. Eating habits. History of falls. Memory and ability to understand (cognition). Work and work Statistician. Reproductive health. Screening  You may have the following tests or measurements: Height, weight, and BMI. Blood pressure. Lipid and cholesterol levels. These may be checked every 5 years, or more frequently if you are over 102 years old. Skin check. Lung cancer screening. You may have this screening every year starting at age 103 if you have a 30-pack-year history of smoking and currently smoke or have quit within the past 15 years. Fecal occult blood test (FOBT) of the stool. You may have this test every year starting at age 33. Flexible sigmoidoscopy or colonoscopy. You may have a sigmoidoscopy every 5 years or a colonoscopy every 10 years starting at age 65. Hepatitis C blood test. Hepatitis B blood test. Sexually transmitted disease (STD) testing. Diabetes screening. This is done by checking your blood sugar (glucose) after you have not eaten for a while (fasting). You may have this done every 1-3 years. Bone density scan. This is done to screen for osteoporosis. You may have this done starting at age 58. Mammogram. This may be done every 1-2 years. Talk to your health care provider about how often you should have regular mammograms. Talk with your health care provider about your test results, treatment options, and if necessary, the need for more tests. Vaccines  Your health care provider may recommend certain vaccines, such as: Influenza vaccine. This is recommended every year. Tetanus, diphtheria,  and acellular pertussis (Tdap, Td) vaccine. You may need a Td booster every  10 years. Zoster vaccine. You may need this after age 76. Pneumococcal 13-valent conjugate (PCV13) vaccine. One dose is recommended after age 76. Pneumococcal polysaccharide (PPSV23) vaccine. One dose is recommended after age 100. Talk to your health care provider about which screenings and vaccines you need and how often you need them. This information is not intended to replace advice given to you by your health care provider. Make sure you discuss any questions you have with your health care provider. Document Released: 02/03/2015 Document Revised: 09/27/2015 Document Reviewed: 11/08/2014 Elsevier Interactive Patient Education  2017 [place] Prevention in the Home Falls can cause injuries. They can happen to people of all ages. There are many things you can do to make your home safe and to help prevent falls. What can I do on the outside of my home? Regularly fix the edges of walkways and driveways and fix any cracks. Remove anything that might make you trip as you walk through a door, such as a raised step or threshold. Trim any bushes or trees on the path to your home. Use bright outdoor lighting. Clear any walking paths of anything that might make someone trip, such as rocks or tools. Regularly check to see if handrails are loose or broken. Make sure that both sides of any steps have handrails. Any raised decks and porches should have guardrails on the edges. Have any leaves, snow, or ice cleared regularly. Use sand or salt on walking paths during winter. Clean up any spills in your garage right away. This includes oil or grease spills. What can I do in the bathroom? Use night lights. Install grab bars by the toilet and in the tub and shower. Do not use towel bars as grab bars. Use non-skid mats or decals in the tub or shower. If you need to sit down in the shower, use a plastic, non-slip stool. Keep the floor dry. Clean up any water that spills on the floor as soon as it  happens. Remove soap buildup in the tub or shower regularly. Attach bath mats securely with double-sided non-slip rug tape. Do not have throw rugs and other things on the floor that can make you trip. What can I do in the bedroom? Use night lights. Make sure that you have a light by your bed that is easy to reach. Do not use any sheets or blankets that are too big for your bed. They should not hang down onto the floor. Have a firm chair that has side arms. You can use this for support while you get dressed. Do not have throw rugs and other things on the floor that can make you trip. What can I do in the kitchen? Clean up any spills right away. Avoid walking on wet floors. Keep items that you use a lot in easy-to-reach places. If you need to reach something above you, use a strong step stool that has a grab bar. Keep electrical cords out of the way. Do not use floor polish or wax that makes floors slippery. If you must use wax, use non-skid floor wax. Do not have throw rugs and other things on the floor that can make you trip. What can I do with my stairs? Do not leave any items on the stairs. Make sure that there are handrails on both sides of the stairs and use them. Fix handrails that are broken or loose. Make sure  that handrails are as long as the stairways. Check any carpeting to make sure that it is firmly attached to the stairs. Fix any carpet that is loose or worn. Avoid having throw rugs at the top or bottom of the stairs. If you do have throw rugs, attach them to the floor with carpet tape. Make sure that you have a light switch at the top of the stairs and the bottom of the stairs. If you do not have them, ask someone to add them for you. What else can I do to help prevent falls? Wear shoes that: Do not have high heels. Have rubber bottoms. Are comfortable and fit you well. Are closed at the toe. Do not wear sandals. If you use a stepladder: Make sure that it is fully opened.  Do not climb a closed stepladder. Make sure that both sides of the stepladder are locked into place. Ask someone to hold it for you, if possible. Clearly mark and make sure that you can see: Any grab bars or handrails. First and last steps. Where the edge of each step is. Use tools that help you move around (mobility aids) if they are needed. These include: Canes. Walkers. Scooters. Crutches. Turn on the lights when you go into a dark area. Replace any light bulbs as soon as they burn out. Set up your furniture so you have a clear path. Avoid moving your furniture around. If any of your floors are uneven, fix them. If there are any pets around you, be aware of where they are. Review your medicines with your doctor. Some medicines can make you feel dizzy. This can increase your chance of falling. Ask your doctor what other things that you can do to help prevent falls. This information is not intended to replace advice given to you by your health care provider. Make sure you discuss any questions you have with your health care provider. Document Released: 11/03/2008 Document Revised: 06/15/2015 Document Reviewed: 02/11/2014 Elsevier Interactive Patient Education  2017 [employer].

## 2020-12-20 NOTE — Progress Notes (Signed)
Subjective:   Melissa Chase is a 80 y.o. female who presents for Medicare Annual (Subsequent) preventive examination.  Virtual Visit via Telephone Note  I connected with  Melissa Chase on 12/20/20 at 11:20 AM EST by telephone and verified that I am speaking with the correct person using two identifiers.  Location: Patient: home Provider: [facility] Persons participating in the virtual visit: [place]   I discussed the limitations, risks, security and privacy concerns of performing an evaluation and management service by telephone and the availability of in person appointments. The patient expressed understanding and agreed to proceed.  Interactive audio and video telecommunications were attempted between this nurse and patient, however failed, due to patient having technical difficulties OR patient did not have access to video capability.  We continued and completed visit with audio only.  Some vital signs may be absent or patient reported.   Clemetine Marker, LPN   Review of Systems     Cardiac Risk Factors include: advanced age (>67men, >71 women);dyslipidemia;hypertension;obesity (BMI >30kg/m2)     Objective:    There were no vitals filed for this visit. There is no height or weight on file to calculate BMI.  Advanced Directives 12/20/2020 06/09/2020 12/13/2019 12/07/2019 11/26/2019 11/01/2019 10/26/2019  Does Patient Have a Medical Advance Directive? No Yes Yes No Yes Yes Yes  Type of Advance Directive - Healthcare Power of [place];Living will - [place];Living will Healthcare Power of [place]  Does patient want to make changes to medical advance directive? - No - Patient declined - - No - Patient declined No - Patient declined No - Patient declined  Copy of [place] in Chart? - No - copy requested No - copy requested - No - copy requested No - copy requested No -  copy requested  Would patient like information on creating a medical advance directive? Yes (MAU/Ambulatory/Procedural Areas - Information given) - - - - - No - Patient declined    Current Medications (verified) Outpatient Encounter Medications as of 12/20/2020  Medication Sig   acetaminophen (TYLENOL) 500 MG tablet Take by mouth. Take 1,000 mg by mouth every 6 (six) hours Patient takes 500 mg in the am and 1,000 mg in the pm   apixaban (ELIQUIS) 5 MG TABS tablet Take 5 mg by mouth 2 (two) times daily.    calcium-vitamin D (OSCAL WITH D) 500-200 MG-UNIT TABS tablet Take 1 tablet by mouth 2 (two) times daily.    celecoxib (CELEBREX) 200 MG capsule Take 1 capsule (200 mg total) by mouth at bedtime.   flecainide (TAMBOCOR) 100 MG tablet Take 50 mg by mouth 2 (two) times daily.   furosemide (LASIX) 20 MG tablet Take 1 tablet by mouth daily.   gabapentin (NEURONTIN) 100 MG capsule Take 1 capsule (100 mg total) by mouth at bedtime.   hydrochlorothiazide (MICROZIDE) 12.5 MG capsule TAKE 1 CAPSULE BY MOUTH EVERY DAY   levothyroxine (SYNTHROID) 25 MCG tablet Take 1 tablet (25 mcg total) by mouth daily.   metoprolol tartrate (LOPRESSOR) 50 MG tablet Take 1 tablet by mouth in the morning and at bedtime. kowalski   pravastatin (PRAVACHOL) 20 MG tablet Take 1 tablet (20 mg total) by mouth daily.   sertraline (ZOLOFT) 100 MG tablet Take 1 tablet (100 mg total) by mouth daily.   No facility-administered encounter medications on file as of 12/20/2020.    Allergies (verified) Patient has no known allergies.  History: Past Medical History:  Diagnosis Date   Afib ([place])    Arthritis    Breast cancer ([place]) 2006   left breast   Breast cancer ([place]) 2019   right breast   Cardiomyopathy ([place])    Chronic constipation    Depression    GERD (gastroesophageal reflux disease)    Hypertension    Insomnia    Insomnia    Moderate mitral regurgitation    Moderate tricuspid regurgitation    Personal  history of radiation therapy 2006   37 tx.   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   BREAST LUMPECTOMY Right 07/09/2017   DCIS, clear margins   BREAST LUMPECTOMY Left 2006   DCIS   BREAST LUMPECTOMY WITH NEEDLE LOCALIZATION Right 07/09/2017   Procedure: BREAST LUMPECTOMY WITH NEEDLE LOCALIZATION;  Surgeon: Herbert Pun, MD;  Location: [facility];  Service: General;  Laterality: Right;   BREAST RECONSTRUCTION WITH PLACEMENT OF TISSUE EXPANDER AND FLEX HD (ACELLULAR HYDRATED DERMIS) Bilateral 11/01/2019   Procedure: BREAST RECONSTRUCTION WITH PLACEMENT OF TISSUE EXPANDER AND FLEX HD (ACELLULAR HYDRATED DERMIS);  Surgeon: Cindra Presume, MD;  Location: [facility];  Service: Plastics;  Laterality: Bilateral;   BREAST SURGERY     left breast lumpectomy   CARDIOVERSION N/A 03/21/2016   Procedure: Cardioversion;  Surgeon: Corey Skains, MD;  Location: [facility];  Service: Cardiovascular;  Laterality: N/A;   CATARACT EXTRACTION, BILATERAL     DEBRIDEMENT AND CLOSURE WOUND Bilateral 11/26/2019   Procedure: Excision of bilateral mastectomy flap necrosis with complex closure;  Surgeon: Cindra Presume, MD;  Location: [place];  Service: Plastics;  Laterality: Bilateral;  1.5 hours total   LAPAROSCOPIC CHOLECYSTECTOMY     TISSUE EXPANDER PLACEMENT Bilateral 11/26/2019   Procedure: exchange of tissue expanders;  Surgeon: Cindra Presume, MD;  Location: [place];  Service: Plastics;  Laterality: Bilateral;   TISSUE EXPANDER PLACEMENT  Bilateral 02/01/2020   Procedure: removal of bilateral expander with closure;  Surgeon: Cindra Presume, MD;  Location: [place];  Service: Plastics;  Laterality: Bilateral;   TONSILLECTOMY     TOTAL MASTECTOMY Bilateral 11/01/2019   Procedure: TOTAL MASTECTOMY w/ immediate reconstruction and Sentinel Node Biopsy;  Surgeon: Herbert Pun, MD;  Location: [facility];  Service: General;  Laterality: Bilateral;   VAGINAL HYSTERECTOMY     Family History  Problem Relation Age of Onset   Diabetes Sister    Breast cancer Daughter        53's   Social History   Socioeconomic History   Marital status: Widowed    Spouse name: Not on file   Number of children: 3   Years of education: Not on file   Highest education level: Not on file  Occupational History   Not on file  Tobacco Use   Smoking status: Never   Smokeless tobacco: Never  Vaping Use   Vaping Use: Never used  Substance and Sexual Activity   Alcohol use: Never   Drug use:  No   Sexual activity: Not Currently  Other Topics Concern   Not on file  Social History Narrative   Pt lives with a friend   Social Determinants of Health   Financial Resource Strain: Low Risk    Difficulty of Paying Living Expenses: Not hard at all  Food Insecurity: No Food Insecurity   Worried About Charity fundraiser in the Last Year: Never true   Arboriculturist in the Last Year: Never true  Transportation Needs: No Transportation Needs   Lack of Transportation (Medical): No   Lack of Transportation (Non-Medical): No  Physical Activity: Inactive   Days of Exercise per Week: 0 days   Minutes of Exercise per Session: 0 min  Stress: No Stress Concern Present   Feeling of Stress : Not at all  Social Connections: Socially Isolated   Frequency of Communication with Friends and Family: More than three times a week   Frequency of Social Gatherings with Friends and Family: More than three times a week   Attends Religious Services: Never   Building surveyor or Organizations: No   Attends Archivist Meetings: Never   Marital Status: Widowed    Tobacco Counseling Counseling given: Not Answered   Clinical Intake:  Pre-visit preparation completed: Yes  Pain : No/denies pain     Nutritional Risks: None Diabetes: No  How often do you need to have someone help you when you read instructions, pamphlets, or other written materials from your doctor or pharmacy?: 1 - Never    Interpreter Needed?: No  Information entered by :: Clemetine Marker LPN   Activities of Daily Living In your present state of health, do you have any difficulty performing the following activities: 12/20/2020 02/01/2020  Hearing? N -  Vision? N -  Difficulty concentrating or making decisions? N -  Walking or climbing stairs? Y -  Dressing or bathing? N -  Doing errands, shopping? N N  Preparing Food and eating ? N -  Using the Toilet? N -  In the past six months, have you accidently leaked urine? Y -  Do you have problems with loss of bowel control? N -  Managing your Medications? N -  Managing your Finances? N -  Housekeeping or managing your Housekeeping? N -  Some recent data might be hidden    Patient Care Team: Juline Patch, MD as PCP - General (Family Medicine)  Indicate any recent Medical Services you may have received from other than [facility] providers in the past year (date may be approximate).     Assessment:   This is a routine wellness examination for Melissa Chase.  Hearing/Vision screen Hearing Screening - Comments:: Pt denies hearing difficulty Vision Screening - Comments:: Annual vision screening done at the [facility] in [place]; due for exam  Dietary issues and exercise activities discussed: Current Exercise Habits: The patient does not participate in regular exercise at present, Exercise limited by: orthopedic condition(s)   Goals Addressed             This Visit's Progress    DIET - INCREASE WATER  INTAKE   On track    Recommend drinking 6-8 glasses of water per day       Depression Screen PHQ 2/9 Scores 12/20/2020 10/17/2020 07/14/2020 04/26/2020 03/03/2020 12/13/2019 10/27/2019  PHQ - 2 Score 0 0 1 0 0 0 0  PHQ- 9 Score - 4 3 0 7 0 0    Fall Risk  Fall Risk  12/20/2020 12/13/2019 10/27/2019 08/19/2019 05/25/2019  Falls in the past year? 1 0 0 0 0  Number falls in past yr: 0 0 - - -  Comment - - - - -  Injury with Fall? 0 0 - - -  Risk for fall due to : Impaired balance/gait No Fall Risks - - -  Follow up Falls prevention discussed Falls prevention discussed Falls evaluation completed Falls evaluation completed Falls evaluation completed    [place]:  Any stairs in or around the home? Yes  If so, are there any without handrails? No  Home free of loose throw rugs in walkways, pet beds, electrical cords, etc? Yes  Adequate lighting in your home to reduce risk of falls? Yes   ASSISTIVE DEVICES UTILIZED TO PREVENT FALLS:  Life alert? No  Use of a cane, walker or w/c? No  Grab bars in the bathroom? Yes  Shower chair or bench in shower? Yes  Elevated toilet seat or a handicapped toilet? No   TIMED UP AND GO:  Was the test performed? No . Telephonic visit  Cognitive Function: Normal cognitive status assessed by direct observation by this Nurse Health Advisor. No abnormalities found.       6CIT Screen 12/13/2019 11/18/2018  What Year? 0 points 0 points  What month? 0 points 0 points  What time? 0 points 0 points  Count back from 20 0 points 0 points  Months in reverse 4 points 0 points  Repeat phrase 2 points 2 points  Total Score 6 2    Immunizations Immunization History  Administered Date(s) Administered   Fluad Quad(high Dose 65+) 10/17/2020   Influenza,inj,Quad PF,6+ Mos 01/01/2013, 12/02/2013, 12/05/2014   Influenza-Unspecified 12/02/2013   Pneumococcal Conjugate-13 10/22/2018    TDAP status: Due, Education has been provided  regarding the importance of this vaccine. Advised may receive this vaccine at local pharmacy or Health Dept. Aware to provide a copy of the vaccination record if obtained from local pharmacy or Health Dept. Verbalized acceptance and understanding.  Flu Vaccine status: Up to date  Pneumococcal vaccine status: Due, Education has been provided regarding the importance of this vaccine. Advised may receive this vaccine at local pharmacy or Health Dept. Aware to provide a copy of the vaccination record if obtained from local pharmacy or Health Dept. Verbalized acceptance and understanding.  Covid-19 vaccine status: Declined, Education has been provided regarding the importance of this vaccine but patient still declined. Advised may receive this vaccine at local pharmacy or Health Dept.or vaccine clinic. Aware to provide a copy of the vaccination record if obtained from local pharmacy or Health Dept. Verbalized acceptance and understanding.  Qualifies for Shingles Vaccine? Yes   Zostavax completed No   Shingrix Completed?: No.    Education has been provided regarding the importance of this vaccine. Patient has been advised to call insurance company to determine out of pocket expense if they have not yet received this vaccine. Advised may also receive vaccine at local pharmacy or Health Dept. Verbalized acceptance and understanding.  Screening Tests Health Maintenance  Topic Date Due   Pneumonia Vaccine 62+ Years old (2 - PPSV23 if available, else PCV20) 10/22/2019   Zoster Vaccines- Shingrix (1 of 2) 01/16/2021 (Originally 01/10/1960)   Hepatitis C Screening  10/17/2021 (Originally 01/11/1959)   TETANUS/TDAP  10/31/2021 (Originally 01/10/1960)   INFLUENZA VACCINE  Completed   DEXA SCAN  Completed   HPV VACCINES  Aged Out  COVID-19 Vaccine  Discontinued    Health Maintenance  Health Maintenance Due  Topic Date Due   Pneumonia Vaccine 11+ Years old (2 - PPSV23 if available, else PCV20)  10/22/2019    Colorectal cancer screening: No longer required.   Mammogram status: No longer required due to bilateral mastectomy.  Bone Density status: Completed 10/07/19. Results reflect: Bone density results: OSTEOPENIA. Repeat every 2 years.  Lung Cancer Screening: (Low Dose CT Chest recommended if Age 16-80 years, 30 pack-year currently smoking OR have quit w/in 15years.) does not qualify.   Additional Screening:  Hepatitis C Screening: does qualify; postponed  Vision Screening: Recommended annual ophthalmology exams for early detection of glaucoma and other disorders of the eye. Is the patient up to date with their annual eye exam?  No  Who is the provider or what is the name of the office in which the patient attends annual eye exams? [facility].   Dental Screening: Recommended annual dental exams for proper oral hygiene  Community Resource Referral / Chronic Care Management: CRR required this visit?  No   CCM required this visit?  No      Plan:     I have personally reviewed and noted the following in the patient's chart:   Medical and social history Use of alcohol, tobacco or illicit drugs  Current medications and supplements including opioid prescriptions.  Functional ability and status Nutritional status Physical activity Advanced directives List of other physicians Hospitalizations, surgeries, and ER visits in previous 12 months Vitals Screenings to include cognitive, depression, and falls Referrals and appointments  In addition, I have reviewed and discussed with patient certain preventive protocols, quality metrics, and best practice recommendations. A written personalized care plan for preventive services as well as general preventive health recommendations were provided to patient.     Clemetine Marker, LPN   [id number]   Nurse Notes: none

## 2020-12-26 DIAGNOSIS — G3184 Mild cognitive impairment, so stated: Secondary | ICD-10-CM | POA: Diagnosis not present

## 2020-12-26 DIAGNOSIS — R0683 Snoring: Secondary | ICD-10-CM | POA: Diagnosis not present

## 2020-12-26 DIAGNOSIS — R413 Other amnesia: Secondary | ICD-10-CM | POA: Diagnosis not present

## 2021-01-16 ENCOUNTER — Encounter: Payer: Self-pay | Admitting: Family Medicine

## 2021-01-16 ENCOUNTER — Other Ambulatory Visit: Payer: Self-pay

## 2021-01-16 ENCOUNTER — Ambulatory Visit (INDEPENDENT_AMBULATORY_CARE_PROVIDER_SITE_OTHER): Payer: Medicare Other | Admitting: Family Medicine

## 2021-01-16 VITALS — BP 124/70 | HR 72 | Ht 65.0 in | Wt 208.0 lb

## 2021-01-16 DIAGNOSIS — R4189 Other symptoms and signs involving cognitive functions and awareness: Secondary | ICD-10-CM

## 2021-01-16 DIAGNOSIS — E039 Hypothyroidism, unspecified: Secondary | ICD-10-CM

## 2021-01-16 NOTE — Patient Instructions (Addendum)
Low-Sodium Eating Plan Sodium, which is an element that makes up salt, helps you maintain a healthy balance of fluids in your body. Too much sodium can increase your blood pressure and cause fluid and waste to be held in your body. Your health care provider or dietitian may recommend following this plan if you have high blood pressure (hypertension), kidney disease, liver disease, or heart failure. Eating less sodium can help lower your blood pressure, reduce swelling, and protect your heart, liver, and kidneys. What are tips for following this plan? Reading food labels The Nutrition Facts label lists the amount of sodium in one serving of the food. If you eat more than one serving, you must multiply the listed amount of sodium by the number of servings. Choose foods with less than 140 mg of sodium per serving. Avoid foods with 300 mg of sodium or more per serving.Cognitive Tips  Keep a journal/notebook with sections for the following (or use sections separately as needed):  Calendar and appointment sheet, schedule for each day, lists of reminders (such as grocery lists or "to do" list), homework assignments for therapy, important information such as family and friends names / addresses / phone numbers, medications, medical history and doctors name / phone numbers.  Avoid / remove clutter and unnecessary items from areas such as countertops / cabinets in kitchen and bathroom, closets, etc.  Organize items by purpose.  Baskets and bins help with this.  Leave notes for reminders above task to be completed.  For example: Note to turn off stove over the the stove; note to lock door beside the door, not to brush teeth then wash face by sink, note to take medication on table etc.  To help recall names of people of people or things, mentally or verbally go through the alphabet to try to determine the 1st letter of the word as this may trigger the name or word you are looking for.  If this is too difficult  and someone else knows the word, have them give you the first letter by asking, "does it start with an "A", "B", "C" etc. (or have them give you the first sound of the word or some other clue).  Review family events, occasions, names, etc.  Pictures are a good way to trigger memory.  Have others correct you if you answer something incorrectly.  Have them speak slowly with a few words to give you time to process and respond.  Don't let others automatically problem-solve. (For example: don't let them automatically lay out clothes in the correct position, but hand it to you folded so that you can figure it out for yourself.)  However, if you need help with tasks, they should give you as little as they can so that you can be successful.  If appropriate and safe, they may allow you to make mistakes so that you can figure out how to correct the error.  (For example, they may allow you to put your shoes on the wrong foot to see if you notice that is wrong).  If you struggle, they should give you a cue.  (Example: "Do your shoes feel right?"  "Do they look right?")Cognitive Tips  Keep a journal/notebook with sections for the following (or use sections separately as needed):  Calendar and appointment sheet, schedule for each day, lists of reminders (such as grocery lists or "to do" list), homework assignments for therapy, important information such as family and friends names / addresses / phone numbers, medications,  medical history and doctors name / phone numbers.  Avoid / remove clutter and unnecessary items from areas such as countertops / cabinets in kitchen and bathroom, closets, etc.  Organize items by purpose.  Baskets and bins help with this.  Leave notes for reminders above task to be completed.  For example: Note to turn off stove over the the stove; note to lock door beside the door, not to brush teeth then wash face by sink, note to take medication on table etc.  To help recall names of people of  people or things, mentally or verbally go through the alphabet to try to determine the 1st letter of the word as this may trigger the name or word you are looking for.  If this is too difficult and someone else knows the word, have them give you the first letter by asking, "does it start with an "A", "B", "C" etc. (or have them give you the first sound of the word or some other clue).  Review family events, occasions, names, etc.  Pictures are a good way to trigger memory.  Have others correct you if you answer something incorrectly.  Have them speak slowly with a few words to give you time to process and respond.  Don't let others automatically problem-solve. (For example: don't let them automatically lay out clothes in the correct position, but hand it to you folded so that you can figure it out for yourself.)  However, if you need help with tasks, they should give you as little as they can so that you can be successful.  If appropriate and safe, they may allow you to make mistakes so that you can figure out how to correct the error.  (For example, they may allow you to put your shoes on the wrong foot to see if you notice that is wrong).  If you struggle, they should give you a cue.  (Example: "Do your shoes feel right?"  "Do they look right?")Cognitive Tips  Keep a journal/notebook with sections for the following (or use sections separately as needed):  Calendar and appointment sheet, schedule for each day, lists of reminders (such as grocery lists or "to do" list), homework assignments for therapy, important information such as family and friends names / addresses / phone numbers, medications, medical history and doctors name / phone numbers.  Avoid / remove clutter and unnecessary items from areas such as countertops / cabinets in kitchen and bathroom, closets, etc.  Organize items by purpose.  Baskets and bins help with this.  Leave notes for reminders above task to be completed.  For example:  Note to turn off stove over the the stove; note to lock door beside the door, not to brush teeth then wash face by sink, note to take medication on table etc.  To help recall names of people of people or things, mentally or verbally go through the alphabet to try to determine the 1st letter of the word as this may trigger the name or word you are looking for.  If this is too difficult and someone else knows the word, have them give you the first letter by asking, "does it start with an "A", "B", "C" etc. (or have them give you the first sound of the word or some other clue).  Review family events, occasions, names, etc.  Pictures are a good way to trigger memory.  Have others correct you if you answer something incorrectly.  Have them speak slowly with a few  words to give you time to process and respond.  Don't let others automatically problem-solve. (For example: don't let them automatically lay out clothes in the correct position, but hand it to you folded so that you can figure it out for yourself.)  However, if you need help with tasks, they should give you as little as they can so that you can be successful.  If appropriate and safe, they may allow you to make mistakes so that you can figure out how to correct the error.  (For example, they may allow you to put your shoes on the wrong foot to see if you notice that is wrong).  If you struggle, they should give you a cue.  (Example: "Do your shoes feel right?"  "Do they look right?") Shopping  Look for lower-sodium products, often labeled as "low-sodium" or "no salt added." Always check the sodium content, even if foods are labeled as "unsalted" or "no salt added." Buy fresh foods. Avoid canned foods and pre-made or frozen meals. Avoid canned, cured, or processed meats. Buy breads that have less than 80 mg of sodium per slice. Cooking  Eat more home-cooked food and less restaurant, buffet, and fast food. Avoid adding salt when cooking. Use  salt-free seasonings or herbs instead of table salt or sea salt. Check with your health care provider or pharmacist before using salt substitutes. Cook with plant-based oils, such as canola, sunflower, or olive oil. Meal planning When eating at a restaurant, ask that your food be prepared with less salt or no salt, if possible. Avoid dishes labeled as brined, pickled, cured, smoked, or made with soy sauce, miso, or teriyaki sauce. Avoid foods that contain MSG (monosodium glutamate). MSG is sometimes added to [place] food, bouillon, and some canned foods. Make meals that can be grilled, baked, poached, roasted, or steamed. These are generally made with less sodium. General information Most people on this plan should limit their sodium intake to 1,500-2,000 mg (milligrams) of sodium each day. What foods should I eat? Fruits Fresh, frozen, or canned fruit. Fruit juice. Vegetables Fresh or frozen vegetables. "No salt added" canned vegetables. "No salt added" tomato sauce and paste. Low-sodium or reduced-sodium tomato and vegetable juice. Grains Low-sodium cereals, including oats, puffed wheat and rice, and shredded wheat. Low-sodium crackers. Unsalted rice. Unsalted pasta. Low-sodium bread. Whole-grain breads and whole-grain pasta. Meats and other proteins Fresh or frozen (no salt added) meat, poultry, seafood, and fish. Low-sodium canned tuna and salmon. Unsalted nuts. Dried peas, beans, and lentils without added salt. Unsalted canned beans. Eggs. Unsalted nut butters. Dairy Milk. Soy milk. Cheese that is naturally low in sodium, such as ricotta cheese, fresh mozzarella, or Swiss cheese. Low-sodium or reduced-sodium cheese. Cream cheese. Yogurt. Seasonings and condiments Fresh and dried herbs and spices. Salt-free seasonings. Low-sodium mustard and ketchup. Sodium-free salad dressing. Sodium-free light mayonnaise. Fresh or refrigerated horseradish. Lemon juice. Vinegar. Other foods Homemade,  reduced-sodium, or low-sodium soups. Unsalted popcorn and pretzels. Low-salt or salt-free chips. The items listed above may not be a complete list of foods and beverages you can eat. Contact a dietitian for more information. What foods should I avoid? Vegetables Sauerkraut, pickled vegetables, and relishes. Olives. [place] fries. Onion rings. Regular canned vegetables (not low-sodium or reduced-sodium). Regular canned tomato sauce and paste (not low-sodium or reduced-sodium). Regular tomato and vegetable juice (not low-sodium or reduced-sodium). Frozen vegetables in sauces. Grains Instant hot cereals. Bread stuffing, pancake, and biscuit mixes. Croutons. Seasoned rice or pasta mixes. Noodle soup cups.  Boxed or frozen macaroni and cheese. Regular salted crackers. Self-rising flour. Meats and other proteins Meat or fish that is salted, canned, smoked, spiced, or pickled. Precooked or cured meat, such as sausages or meat loaves. Berniece Salines. Ham. Pepperoni. Hot dogs. Corned beef. Chipped beef. Salt pork. Jerky. Pickled herring. Anchovies and sardines. Regular canned tuna. Salted nuts. Dairy Processed cheese and cheese spreads. Hard cheeses. Cheese curds. Blue cheese. Feta cheese. String cheese. Regular cottage cheese. Buttermilk. Canned milk. Fats and oils Salted butter. Regular margarine. Ghee. Bacon fat. Seasonings and condiments Onion salt, garlic salt, seasoned salt, table salt, and sea salt. Canned and packaged gravies. Worcestershire sauce. Tartar sauce. Barbecue sauce. Teriyaki sauce. Soy sauce, including reduced-sodium. Steak sauce. Fish sauce. Oyster sauce. Cocktail sauce. Horseradish that you find on the shelf. Regular ketchup and mustard. Meat flavorings and tenderizers. Bouillon cubes. Hot sauce. Pre-made or packaged marinades. Pre-made or packaged taco seasonings. Relishes. Regular salad dressings. Salsa. Other foods Salted popcorn and pretzels. Corn chips and puffs. Potato and tortilla chips.  Canned or dried soups. Pizza. Frozen entrees and pot pies. The items listed above may not be a complete list of foods and beverages you should avoid. Contact a dietitian for more information. Summary Eating less sodium can help lower your blood pressure, reduce swelling, and protect your heart, liver, and kidneys. Most people on this plan should limit their sodium intake to 1,500-2,000 mg (milligrams) of sodium each day. Canned, boxed, and frozen foods are high in sodium. Restaurant foods, fast foods, and pizza are also very high in sodium. You also get sodium by adding salt to food. Try to cook at home, eat more fresh fruits and vegetables, and eat less fast food and canned, processed, or prepared foods. This information is not intended to replace advice given to you by your health care provider. Make sure you discuss any questions you have with your health care provider. Document Revised: 02/12/2019 Document Reviewed: 12/09/2018 Elsevier Patient Education  2022 [employer]. Cognitive Tips  Keep a journal/notebook with sections for the following (or use sections separately as needed):  Calendar and appointment sheet, schedule for each day, lists of reminders (such as grocery lists or "to do" list), homework assignments for therapy, important information such as family and friends names / addresses / phone numbers, medications, medical history and doctors name / phone numbers.  Avoid / remove clutter and unnecessary items from areas such as countertops / cabinets in kitchen and bathroom, closets, etc.  Organize items by purpose.  Baskets and bins help with this.  Leave notes for reminders above task to be completed.  For example: Note to turn off stove over the the stove; note to lock door beside the door, not to brush teeth then wash face by sink, note to take medication on table etc.  To help recall names of people of people or things, mentally or verbally go through the alphabet to try to  determine the 1st letter of the word as this may trigger the name or word you are looking for.  If this is too difficult and someone else knows the word, have them give you the first letter by asking, "does it start with an "A", "B", "C" etc. (or have them give you the first sound of the word or some other clue).  Review family events, occasions, names, etc.  Pictures are a good way to trigger memory.  Have others correct you if you answer something incorrectly.  Have them speak slowly with a  few words to give you time to process and respond.  Don't let others automatically problem-solve. (For example: don't let them automatically lay out clothes in the correct position, but hand it to you folded so that you can figure it out for yourself.)  However, if you need help with tasks, they should give you as little as they can so that you can be successful.  If appropriate and safe, they may allow you to make mistakes so that you can figure out how to correct the error.  (For example, they may allow you to put your shoes on the wrong foot to see if you notice that is wrong).  If you struggle, they should give you a cue.  (Example: "Do your shoes feel right?"  "Do they look right?")

## 2021-01-16 NOTE — Progress Notes (Signed)
Date:  01/16/2021   Name:  Melissa Chase   DOB:  June 21, 1940   MRN:  [id number]   Chief Complaint: Hypothyroidism and Weight Gain (Had 6 lb weight gain since October)  Thyroid Problem Presents for follow-up visit. Symptoms include cold intolerance, leg swelling and weight gain. Patient reports no anxiety, constipation, depressed mood, diaphoresis, diarrhea, dry skin, fatigue, hair loss, heat intolerance, hoarse voice, nail problem, palpitations, tremors, visual change or weight loss. The symptoms have been stable.   Lab Results  Component Value Date   NA 146 (H) 04/26/2020   K 4.7 04/26/2020   CO2 22 04/26/2020   GLUCOSE 92 04/26/2020   BUN 17 04/26/2020   CREATININE 0.96 04/26/2020   CALCIUM 9.1 04/26/2020   EGFR 61 04/26/2020   GFRNONAA 64 03/03/2020   Lab Results  Component Value Date   CHOL 182 04/26/2020   HDL 53 04/26/2020   LDLCALC 105 (H) 04/26/2020   TRIG 138 04/26/2020   CHOLHDL 2.3 05/26/2019   Lab Results  Component Value Date   TSH 3.860 07/17/2020   Lab Results  Component Value Date   HGBA1C 5.4 01/01/2013   Lab Results  Component Value Date   WBC 5.2 07/17/2020   HGB 13.6 07/17/2020   HCT 40.7 07/17/2020   MCV 92 07/17/2020   PLT 177 07/17/2020   Lab Results  Component Value Date   ALT 8 04/26/2020   AST 23 04/26/2020   ALKPHOS 44 04/26/2020   BILITOT 0.8 04/26/2020   No results found for: 25OHVITD2, 25OHVITD3, VD25OH   Review of Systems  Constitutional:  Positive for weight gain. Negative for chills, diaphoresis, fatigue, fever and weight loss.  HENT:  Negative for drooling, ear discharge, ear pain, hoarse voice and sore throat.   Respiratory:  Negative for cough, shortness of breath and wheezing.   Cardiovascular:  Negative for chest pain, palpitations and leg swelling.  Gastrointestinal:  Negative for abdominal pain, blood in stool, constipation, diarrhea and nausea.  Endocrine: Positive for cold intolerance. Negative for heat  intolerance and polydipsia.  Genitourinary:  Negative for dysuria, frequency, hematuria and urgency.  Musculoskeletal:  Negative for back pain, myalgias and neck pain.  Skin:  Negative for rash.  Allergic/Immunologic: Negative for environmental allergies.  Neurological:  Negative for dizziness, tremors and headaches.  Hematological:  Does not bruise/bleed easily.  Psychiatric/Behavioral:  Negative for suicidal ideas. The patient is not nervous/anxious.    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   Arthritis 06/06/2014   Aggrieved 06/06/2014   Dermatitis, eczematoid 06/06/2014   Recurrent major depressive episodes ([place]) 06/06/2014   Essential (primary) hypertension 06/06/2014   Gastro-esophageal reflux disease without esophagitis 06/06/2014   Cannot sleep 06/06/2014   Menopause 06/06/2014    No Known Allergies  Past Surgical History:  Procedure Laterality Date   ABDOMINAL HYSTERECTOMY     APPLICATION OF WOUND VAC  11/01/2019   Procedure: APPLICATION OF WOUND VAC;  Surgeon: Herbert Pun, MD;  Location: [facility];  Service: General;;  BREAST BIOPSY Right 06/25/2017   ductal carcinoma in situ involving a papilloma and encapsulated papillary carcinoma   BREAST BIOPSY Left 10/12/2019   affirm bx of calcs, x marker, path pending   BREAST BIOPSY Left 2006   DCIS   BREAST LUMPECTOMY Right 07/09/2017   DCIS, clear margins   BREAST LUMPECTOMY Left 2006   DCIS    BREAST LUMPECTOMY WITH NEEDLE LOCALIZATION Right 07/09/2017   Procedure: BREAST LUMPECTOMY WITH NEEDLE LOCALIZATION;  Surgeon: Herbert Pun, MD;  Location: [facility];  Service: General;  Laterality: Right;   BREAST RECONSTRUCTION WITH PLACEMENT OF TISSUE EXPANDER AND FLEX HD (ACELLULAR HYDRATED DERMIS) Bilateral 11/01/2019   Procedure: BREAST RECONSTRUCTION WITH PLACEMENT OF TISSUE EXPANDER AND FLEX HD (ACELLULAR HYDRATED DERMIS);  Surgeon: Cindra Presume, MD;  Location: [facility];  Service: Plastics;  Laterality: Bilateral;   BREAST SURGERY     left breast lumpectomy   CARDIOVERSION N/A 03/21/2016   Procedure: Cardioversion;  Surgeon: Corey Skains, MD;  Location: [facility];  Service: Cardiovascular;  Laterality: N/A;   CATARACT EXTRACTION, BILATERAL     DEBRIDEMENT AND CLOSURE WOUND Bilateral 11/26/2019   Procedure: Excision of bilateral mastectomy flap necrosis with complex closure;  Surgeon: Cindra Presume, MD;  Location: [place];  Service: Plastics;  Laterality: Bilateral;  1.5 hours total   LAPAROSCOPIC CHOLECYSTECTOMY     TISSUE EXPANDER PLACEMENT Bilateral 11/26/2019   Procedure: exchange of tissue expanders;  Surgeon: Cindra Presume, MD;  Location: [place];  Service: Plastics;  Laterality: Bilateral;   TISSUE EXPANDER PLACEMENT Bilateral 02/01/2020   Procedure: removal of bilateral expander with closure;  Surgeon: Cindra Presume, MD;  Location: [place];  Service: Plastics;  Laterality: Bilateral;   TONSILLECTOMY     TOTAL MASTECTOMY Bilateral 11/01/2019   Procedure: TOTAL MASTECTOMY w/ immediate reconstruction and Sentinel Node Biopsy;  Surgeon: Herbert Pun, MD;  Location: [facility];  Service: General;  Laterality: Bilateral;   VAGINAL HYSTERECTOMY      Social History   Tobacco Use   Smoking status: Never   Smokeless tobacco: Never  Vaping Use   Vaping Use: Never used  Substance Use Topics   Alcohol use: Never   Drug use: No     Medication list has been reviewed  and updated.  Current Meds  Medication Sig   acetaminophen (TYLENOL) 500 MG tablet Take by mouth. Take 1,000 mg by mouth every 6 (six) hours Patient takes 500 mg in the am and 1,000 mg in the pm   apixaban (ELIQUIS) 5 MG TABS tablet Take 5 mg by mouth 2 (two) times daily.    calcium-vitamin D (OSCAL WITH D) 500-200 MG-UNIT TABS tablet Take 1 tablet by mouth 2 (two) times daily.    celecoxib (CELEBREX) 200 MG capsule Take 1 capsule (200 mg total) by mouth at bedtime.   flecainide (TAMBOCOR) 100 MG tablet Take 50 mg by mouth 2 (two) times daily.   furosemide (LASIX) 20 MG tablet Take 1 tablet by mouth daily.   hydrochlorothiazide (MICROZIDE) 12.5 MG capsule TAKE 1 CAPSULE BY MOUTH EVERY DAY   levothyroxine (SYNTHROID) 25 MCG tablet Take 1 tablet (25 mcg total) by mouth daily.   metoprolol tartrate (LOPRESSOR) 50 MG tablet Take 1 tablet by mouth in the morning and at bedtime. kowalski   pravastatin (PRAVACHOL) 20 MG tablet Take 1 tablet (20 mg total) by mouth daily.   sertraline (ZOLOFT) 100 MG tablet Take 1 tablet (100 mg total) by mouth daily.   [  DISCONTINUED] gabapentin (NEURONTIN) 100 MG capsule Take 1 capsule (100 mg total) by mouth at bedtime.    PHQ 2/9 Scores 01/16/2021 12/20/2020 10/17/2020 07/14/2020  PHQ - 2 Score 0 0 0 1  PHQ- 9 Score 0 - 4 3    GAD 7 : Generalized Anxiety Score 10/17/2020 07/14/2020 04/26/2020 03/03/2020  Nervous, Anxious, on Edge 0 0 0 1  Control/stop worrying 0 0 1 2  Worry too much - different things 0 0 0 1  Trouble relaxing 0 0 0 0  Restless 0 0 0 0  Easily annoyed or irritable 1 0 0 2  Afraid - awful might happen 0 0 0 1  Total GAD 7 Score 1 0 1 7  Anxiety Difficulty Not difficult at all - Not difficult at all Not difficult at all    BP Readings from Last 3 Encounters:  01/16/21 124/70  10/31/20 120/60  10/17/20 128/70    Physical Exam Vitals and nursing note reviewed.  Constitutional:      General: She is not in acute distress.    Appearance:  She is not diaphoretic.  HENT:     Head: Normocephalic and atraumatic.     Right Ear: Tympanic membrane, ear canal and external ear normal.     Left Ear: Tympanic membrane, ear canal and external ear normal.     Nose: Nose normal. No congestion or rhinorrhea.  Eyes:     General:        Right eye: No discharge.        Left eye: No discharge.     Conjunctiva/sclera: Conjunctivae normal.     Pupils: Pupils are equal, round, and reactive to light.  Neck:     Thyroid: No thyromegaly.     Vascular: No carotid bruit or JVD.  Cardiovascular:     Rate and Rhythm: Normal rate and regular rhythm.     Heart sounds: Normal heart sounds. No murmur heard.   No friction rub. No gallop.  Pulmonary:     Effort: Pulmonary effort is normal. No respiratory distress.     Breath sounds: Normal breath sounds. No stridor. No wheezing or rhonchi.  Abdominal:     General: Bowel sounds are normal.     Palpations: Abdomen is soft. There is no mass.     Tenderness: There is no abdominal tenderness. There is no guarding.  Musculoskeletal:        General: Swelling present. Normal range of motion.     Cervical back: Normal range of motion and neck supple. No rigidity or tenderness.  Lymphadenopathy:     Cervical: No cervical adenopathy.  Skin:    General: Skin is warm and dry.     Findings: No bruising or erythema.  Neurological:     Mental Status: She is alert.     Deep Tendon Reflexes: Reflexes are normal and symmetric.    Wt Readings from Last 3 Encounters:  01/16/21 208 lb (94.3 kg)  10/31/20 202 lb (91.6 kg)  10/17/20 201 lb (91.2 kg)    BP 124/70    Pulse 72    Ht $R'5\' 5"'JW$  (1.651 m)    Wt 208 lb (94.3 kg)    BMI 34.61 kg/m   Assessment and Plan:  1. Hypothyroidism, unspecified type Chronic.  Controlled.  Stable.  On the patient has had some weight gain that may be associated with insufficient thyroid replacement we will check TSH with thyroid panel to see what the current status of control is  currently patient is only on Synthroid 25 mcg this may not be sufficient that may be the reason for her weight gain.  So we will be prepared to increase his if TSH dictates.  In the meantime I have discussed with the patient needs to watch her sodium intake as well as her carbohydrates to control weight and not just eliminate concentrated sweets. - Thyroid Panel With TSH  2. Cognitive change Patient has noticed some cognitive change but was not a candidate for Aricept given that she has some bradycardia and that it sounds like they want to wait for later in the course for the [place].  At this point in time I do not know what her scores are but I have given her some cognitive tips and remembering things and suggested some word searches for cognitive surveillance as well.

## 2021-01-17 ENCOUNTER — Other Ambulatory Visit: Payer: Self-pay

## 2021-01-17 DIAGNOSIS — E039 Hypothyroidism, unspecified: Secondary | ICD-10-CM

## 2021-01-17 LAB — THYROID PANEL WITH TSH
Free Thyroxine Index: 1.6 (ref 1.2–4.9)
T3 Uptake Ratio: 23 % — ABNORMAL LOW (ref 24–39)
T4, Total: 6.8 ug/dL (ref 4.5–12.0)
TSH: 1.02 u[IU]/mL (ref 0.450–4.500)

## 2021-01-17 MED ORDER — LEVOTHYROXINE SODIUM 25 MCG PO TABS: 25.0000 ug | ORAL_TABLET | Freq: Every day | ORAL | 1 refills | Status: DC

## 2021-01-22 ENCOUNTER — Other Ambulatory Visit: Payer: Self-pay | Admitting: Family Medicine

## 2021-01-22 DIAGNOSIS — I420 Dilated cardiomyopathy: Secondary | ICD-10-CM

## 2021-01-22 DIAGNOSIS — I10 Essential (primary) hypertension: Secondary | ICD-10-CM

## 2021-01-29 DIAGNOSIS — I10 Essential (primary) hypertension: Secondary | ICD-10-CM | POA: Diagnosis not present

## 2021-01-29 DIAGNOSIS — I480 Paroxysmal atrial fibrillation: Secondary | ICD-10-CM | POA: Diagnosis not present

## 2021-01-29 DIAGNOSIS — E782 Mixed hyperlipidemia: Secondary | ICD-10-CM | POA: Diagnosis not present

## 2021-01-29 DIAGNOSIS — Z6841 Body Mass Index (BMI) 40.0 and over, adult: Secondary | ICD-10-CM | POA: Diagnosis not present

## 2021-02-15 ENCOUNTER — Telehealth: Payer: Self-pay | Admitting: *Deleted

## 2021-02-15 NOTE — Telephone Encounter (Signed)
Received Home Health Certification and Plan of Care from [facility] requesting signature,date and return.  Given to provider to sign.  Home Health Certification and Plan of Care signed and faxed back to [facility].  Confirmation received and copy scanned into the chart.//AB/CMA

## 2021-02-18 ENCOUNTER — Other Ambulatory Visit: Payer: Self-pay | Admitting: Family Medicine

## 2021-02-18 DIAGNOSIS — I10 Essential (primary) hypertension: Secondary | ICD-10-CM

## 2021-02-18 DIAGNOSIS — I420 Dilated cardiomyopathy: Secondary | ICD-10-CM

## 2021-02-18 NOTE — Telephone Encounter (Signed)
Rx 01/22/21 #90 Call to pharmacy- patient did get Rx- not needed now Requested Prescriptions  Pending Prescriptions Disp Refills   hydrochlorothiazide (MICROZIDE) 12.5 MG capsule [Pharmacy Med Name: HYDROCHLOROTHIAZIDE 12.5 MG CP] 90 capsule 0    Sig: TAKE 1 CAPSULE BY MOUTH EVERY DAY     Cardiovascular: Diuretics - Thiazide Failed - 02/18/2021 10:34 AM      Failed - Na in normal range and within 360 days    Sodium  Date Value Ref Range Status  04/26/2020 146 (H) 134 - 144 mmol/L Final         Passed - Ca in normal range and within 360 days    Calcium  Date Value Ref Range Status  04/26/2020 9.1 8.7 - 10.3 mg/dL Final         Passed - Cr in normal range and within 360 days    Creatinine, Ser  Date Value Ref Range Status  04/26/2020 0.96 0.57 - 1.00 mg/dL Final         Passed - K in normal range and within 360 days    Potassium  Date Value Ref Range Status  04/26/2020 4.7 3.5 - 5.2 mmol/L Final         Passed - Last BP in normal range    BP Readings from Last 1 Encounters:  01/16/21 124/70         Passed - Valid encounter within last 6 months    Recent Outpatient Visits          1 month ago Hypothyroidism, unspecified type   [facility] Juline Patch, MD   3 months ago Degenerative lumbar disc   [facility] Juline Patch, MD   4 months ago Hypothyroidism, unspecified type   [facility] Juline Patch, MD   7 months ago Hair loss   [facility] Juline Patch, MD   9 months ago Memory loss, short term   [facility] Juline Patch, MD      Future Appointments            In 4 months Juline Patch, MD [facility]

## 2021-04-02 DIAGNOSIS — Z20822 Contact with and (suspected) exposure to covid-19: Secondary | ICD-10-CM | POA: Diagnosis not present

## 2021-04-20 ENCOUNTER — Other Ambulatory Visit: Payer: Self-pay | Admitting: Family Medicine

## 2021-04-20 ENCOUNTER — Telehealth: Payer: Self-pay

## 2021-04-20 DIAGNOSIS — M47816 Spondylosis without myelopathy or radiculopathy, lumbar region: Secondary | ICD-10-CM

## 2021-04-20 DIAGNOSIS — M5136 Other intervertebral disc degeneration, lumbar region: Secondary | ICD-10-CM

## 2021-04-20 DIAGNOSIS — M542 Cervicalgia: Secondary | ICD-10-CM

## 2021-04-20 NOTE — Telephone Encounter (Signed)
Requested Prescriptions  ?Pending Prescriptions Disp Refills  ?? celecoxib (CELEBREX) 200 MG capsule [Pharmacy Med Name: CELECOXIB 200 MG CAPSULE] 30 capsule 5  ?  Sig: TAKE 1 CAPSULE BY MOUTH AT BEDTIME.  ?  ? Analgesics:  COX2 Inhibitors Failed - 04/20/2021  2:02 AM  ?  ?  Failed - Manual Review: Labs are only required if the patient has taken medication for more than 8 weeks.  ?  ?  Passed - HGB in normal range and within 360 days  ?  Hemoglobin  ?Date Value Ref Range Status  ?07/17/2020 13.6 11.1 - 15.9 g/dL Final  ?   ?  ?  Passed - Cr in normal range and within 360 days  ?  Creatinine, Ser  ?Date Value Ref Range Status  ?04/26/2020 0.96 0.57 - 1.00 mg/dL Final  ?   ?  ?  Passed - HCT in normal range and within 360 days  ?  Hematocrit  ?Date Value Ref Range Status  ?07/17/2020 40.7 34.0 - 46.6 % Final  ?   ?  ?  Passed - AST in normal range and within 360 days  ?  AST  ?Date Value Ref Range Status  ?04/26/2020 23 0 - 40 IU/L Final  ?   ?  ?  Passed - ALT in normal range and within 360 days  ?  ALT  ?Date Value Ref Range Status  ?04/26/2020 8 0 - 32 IU/L Final  ?   ?  ?  Passed - eGFR is 30 or above and within 360 days  ?  GFR calc Af Amer  ?Date Value Ref Range Status  ?03/03/2020 74 >59 mL/min/1.73 Final  ?  Comment:  ?  **In accordance with recommendations from the NKF-ASN Task force,** ?  [facility] is in the process of updating its eGFR calculation to the ?  2021 CKD-EPI creatinine equation that estimates kidney function ?  without a race variable. ?  ? ?GFR, Estimated  ?Date Value Ref Range Status  ?02/01/2020 53 (L) >60 mL/min Final  ?  Comment:  ?  (NOTE) ?Calculated using the CKD-EPI Creatinine Equation (2021) ?  ? ?GFR calc non Af Amer  ?Date Value Ref Range Status  ?03/03/2020 64 >59 mL/min/1.73 Final  ? ?eGFR  ?Date Value Ref Range Status  ?04/26/2020 61 >59 mL/min/1.73 Final  ?   ?  ?  Passed - Patient is not pregnant  ?  ?  Passed - Valid encounter within last 12 months  ?  Recent Outpatient Visits   ?       ? 3 months ago Hypothyroidism, unspecified type  ? [facility] Juline Patch, MD  ? 5 months ago Degenerative lumbar disc  ? [facility] Juline Patch, MD  ? 6 months ago Hypothyroidism, unspecified type  ? [facility] Juline Patch, MD  ? 9 months ago Hair loss  ? [facility] Juline Patch, MD  ? 11 months ago Memory loss, short term  ? [facility] Juline Patch, MD  ?  ?  ?Future Appointments   ?        ? In 2 months Juline Patch, MD [facility] [place]  ?  ? ?  ?  ?  ? ? ?

## 2021-04-20 NOTE — Telephone Encounter (Signed)
Called Melissa Chase- she will call cardio and then will let us know if they can prescribe the celebrex due to ins. Not wanting to pay for it. If they can't, we will try to send to ortho for further eval. Or she can take Tylenol as needed due to her A-Fib. ?

## 2021-05-10 DIAGNOSIS — Z20822 Contact with and (suspected) exposure to covid-19: Secondary | ICD-10-CM | POA: Diagnosis not present

## 2021-05-17 ENCOUNTER — Ambulatory Visit: Payer: Medicare Other | Admitting: Plastic Surgery

## 2021-05-19 DIAGNOSIS — Z20822 Contact with and (suspected) exposure to covid-19: Secondary | ICD-10-CM | POA: Diagnosis not present

## 2021-05-20 ENCOUNTER — Other Ambulatory Visit: Payer: Self-pay | Admitting: Family Medicine

## 2021-05-20 DIAGNOSIS — F339 Major depressive disorder, recurrent, unspecified: Secondary | ICD-10-CM

## 2021-05-20 DIAGNOSIS — I10 Essential (primary) hypertension: Secondary | ICD-10-CM

## 2021-05-20 DIAGNOSIS — I420 Dilated cardiomyopathy: Secondary | ICD-10-CM

## 2021-05-22 DIAGNOSIS — Z20822 Contact with and (suspected) exposure to covid-19: Secondary | ICD-10-CM | POA: Diagnosis not present

## 2021-06-05 DIAGNOSIS — H353121 Nonexudative age-related macular degeneration, left eye, early dry stage: Secondary | ICD-10-CM | POA: Diagnosis not present

## 2021-06-05 DIAGNOSIS — H524 Presbyopia: Secondary | ICD-10-CM | POA: Diagnosis not present

## 2021-06-08 ENCOUNTER — Inpatient Hospital Stay: Payer: Medicare Other | Admitting: Oncology

## 2021-06-09 ENCOUNTER — Telehealth: Payer: Self-pay | Admitting: *Deleted

## 2021-06-09 ENCOUNTER — Other Ambulatory Visit: Payer: Self-pay | Admitting: *Deleted

## 2021-06-09 NOTE — Telephone Encounter (Signed)
Melissa Chase sent secure chat that pt called at 11:15 that she could not make her appt today due to the was sick but will call back when she is feeling good and r/s the appt

## 2021-06-09 NOTE — Progress Notes (Signed)
error 

## 2021-06-17 ENCOUNTER — Other Ambulatory Visit: Payer: Self-pay | Admitting: Family Medicine

## 2021-06-17 DIAGNOSIS — F339 Major depressive disorder, recurrent, unspecified: Secondary | ICD-10-CM

## 2021-06-17 DIAGNOSIS — I10 Essential (primary) hypertension: Secondary | ICD-10-CM

## 2021-06-17 DIAGNOSIS — I420 Dilated cardiomyopathy: Secondary | ICD-10-CM

## 2021-07-11 ENCOUNTER — Telehealth: Payer: Self-pay | Admitting: *Deleted

## 2021-07-11 NOTE — Telephone Encounter (Signed)
Received on (03/28/2021) via fax DME Standard Written Order from [facility] requesting signature and return.  Given to provider to sign and return.    DME Standard Written Order signed and faxed to [facility].  Confirmation received and copy scanned into the chart.//AB/CMA

## 2021-07-17 ENCOUNTER — Encounter: Payer: Self-pay | Admitting: Family Medicine

## 2021-07-17 ENCOUNTER — Ambulatory Visit (INDEPENDENT_AMBULATORY_CARE_PROVIDER_SITE_OTHER): Payer: Medicare Other | Admitting: Family Medicine

## 2021-07-17 VITALS — BP 132/82 | HR 70 | Ht 65.0 in | Wt 223.0 lb

## 2021-07-17 DIAGNOSIS — M542 Cervicalgia: Secondary | ICD-10-CM | POA: Diagnosis not present

## 2021-07-17 DIAGNOSIS — M47816 Spondylosis without myelopathy or radiculopathy, lumbar region: Secondary | ICD-10-CM | POA: Diagnosis not present

## 2021-07-17 DIAGNOSIS — I420 Dilated cardiomyopathy: Secondary | ICD-10-CM | POA: Diagnosis not present

## 2021-07-17 DIAGNOSIS — I10 Essential (primary) hypertension: Secondary | ICD-10-CM

## 2021-07-17 DIAGNOSIS — E785 Hyperlipidemia, unspecified: Secondary | ICD-10-CM

## 2021-07-17 DIAGNOSIS — E039 Hypothyroidism, unspecified: Secondary | ICD-10-CM

## 2021-07-17 DIAGNOSIS — M5136 Other intervertebral disc degeneration, lumbar region: Secondary | ICD-10-CM | POA: Diagnosis not present

## 2021-07-17 DIAGNOSIS — E7849 Other hyperlipidemia: Secondary | ICD-10-CM

## 2021-07-17 DIAGNOSIS — F339 Major depressive disorder, recurrent, unspecified: Secondary | ICD-10-CM

## 2021-07-17 MED ORDER — LEVOTHYROXINE SODIUM 25 MCG PO TABS: 25.0000 ug | ORAL_TABLET | Freq: Every day | ORAL | 1 refills | Status: DC

## 2021-07-17 MED ORDER — CELECOXIB 200 MG PO CAPS: 200.0000 mg | ORAL_CAPSULE | Freq: Every day | ORAL | 2 refills | Status: DC

## 2021-07-17 MED ORDER — HYDROCHLOROTHIAZIDE 12.5 MG PO CAPS: ORAL_CAPSULE | ORAL | 1 refills | Status: DC

## 2021-07-17 MED ORDER — PRAVASTATIN SODIUM 20 MG PO TABS: 20.0000 mg | ORAL_TABLET | Freq: Every day | ORAL | 1 refills | Status: DC

## 2021-07-17 MED ORDER — SERTRALINE HCL 100 MG PO TABS: 100.0000 mg | ORAL_TABLET | Freq: Every day | ORAL | 1 refills | Status: DC

## 2021-07-18 ENCOUNTER — Ambulatory Visit: Payer: Medicare Other | Admitting: Plastic Surgery

## 2021-07-18 LAB — LIPID PANEL WITH LDL/HDL RATIO
Cholesterol, Total: 251 mg/dL — ABNORMAL HIGH (ref 100–199)
HDL: 90 mg/dL (ref >39)
LDL Chol Calc (NIH): 144 mg/dL — ABNORMAL HIGH (ref 0–99)
LDL/HDL Ratio: 1.6 ratio (ref 0.0–3.2)
Triglycerides: 100 mg/dL (ref 0–149)
VLDL Cholesterol Cal: 17 mg/dL (ref 5–40)

## 2021-07-18 LAB — COMPREHENSIVE METABOLIC PANEL
ALT: 15 IU/L (ref 0–32)
AST: 25 IU/L (ref 0–40)
Albumin/Globulin Ratio: 1.7 (ref 1.2–2.2)
Albumin: 4.4 g/dL (ref 3.7–4.7)
Alkaline Phosphatase: 48 IU/L (ref 44–121)
BUN/Creatinine Ratio: 21 (ref 12–28)
BUN: 25 mg/dL (ref 8–27)
Bilirubin Total: 0.7 mg/dL (ref 0.0–1.2)
CO2: 25 mmol/L (ref 20–29)
Calcium: 9.8 mg/dL (ref 8.7–10.3)
Chloride: 102 mmol/L (ref 96–106)
Creatinine, Ser: 1.2 mg/dL — ABNORMAL HIGH (ref 0.57–1.00)
Globulin, Total: 2.6 g/dL (ref 1.5–4.5)
Glucose: 92 mg/dL (ref 70–99)
Potassium: 4.7 mmol/L (ref 3.5–5.2)
Sodium: 140 mmol/L (ref 134–144)
Total Protein: 7 g/dL (ref 6.0–8.5)
eGFR: 46 mL/min/{1.73_m2} — ABNORMAL LOW (ref >59)

## 2021-08-06 DIAGNOSIS — I480 Paroxysmal atrial fibrillation: Secondary | ICD-10-CM | POA: Diagnosis not present

## 2021-08-06 DIAGNOSIS — I7121 Aneurysm of the ascending aorta, without rupture: Secondary | ICD-10-CM | POA: Diagnosis not present

## 2021-08-06 DIAGNOSIS — E782 Mixed hyperlipidemia: Secondary | ICD-10-CM | POA: Diagnosis not present

## 2021-08-06 DIAGNOSIS — I10 Essential (primary) hypertension: Secondary | ICD-10-CM | POA: Diagnosis not present

## 2021-10-22 ENCOUNTER — Telehealth: Payer: Self-pay | Admitting: Family Medicine

## 2021-10-22 NOTE — Telephone Encounter (Unsigned)
Referral Request - Has patient seen PCP for this complaint? Yes.   *If NO, is insurance requiring patient see PCP for this issue before PCP can refer them? Referral for which specialty: Neurology Preferred provider/office: "Same one that Arbie Cookey used"  Reason for referral: Daughter states Baxter Flattery knows

## 2021-10-29 ENCOUNTER — Ambulatory Visit
Admission: RE | Admit: 2021-10-29 | Discharge: 2021-10-29 | Disposition: A | Discharge status: Home / Self Care | Payer: Medicare Other | Source: Ambulatory Visit | Attending: Family Medicine | Admitting: Family Medicine

## 2021-10-29 ENCOUNTER — Encounter: Payer: Self-pay | Admitting: Family Medicine

## 2021-10-29 ENCOUNTER — Ambulatory Visit (INDEPENDENT_AMBULATORY_CARE_PROVIDER_SITE_OTHER): Payer: Medicare Other | Admitting: Family Medicine

## 2021-10-29 ENCOUNTER — Ambulatory Visit
Admission: RE | Admit: 2021-10-29 | Discharge: 2021-10-29 | Disposition: A | Discharge status: Home / Self Care | Payer: Medicare Other | Attending: Family Medicine | Admitting: Family Medicine

## 2021-10-29 VITALS — BP 124/76 | HR 68 | Ht 65.0 in | Wt 226.0 lb

## 2021-10-29 DIAGNOSIS — Z23 Encounter for immunization: Secondary | ICD-10-CM

## 2021-10-29 DIAGNOSIS — M47816 Spondylosis without myelopathy or radiculopathy, lumbar region: Secondary | ICD-10-CM | POA: Insufficient documentation

## 2021-10-29 DIAGNOSIS — M545 Low back pain, unspecified: Secondary | ICD-10-CM | POA: Diagnosis not present

## 2021-10-29 DIAGNOSIS — M5136 Other intervertebral disc degeneration, lumbar region: Secondary | ICD-10-CM | POA: Insufficient documentation

## 2021-10-29 MED ORDER — PREDNISONE 10 MG PO TABS: 10.0000 mg | ORAL_TABLET | Freq: Every day | ORAL | 0 refills | Status: DC

## 2021-10-29 NOTE — Progress Notes (Signed)
Date:  10/29/2021   Name:  Melissa Chase   DOB:  1940/04/06   MRN:  [id number]   Chief Complaint: Back Pain and Flu Vaccine  Back Pain This is a chronic problem. The current episode started more than 1 year ago. The problem has been waxing and waning since onset. The pain is present in the lumbar spine. The quality of the pain is described as aching. The pain radiates to the left thigh and right thigh. The pain is at a severity of 8/10. The pain is moderate. The pain is The same all the time. The symptoms are aggravated by standing, sitting and bending (walking). Associated symptoms include leg pain. Pertinent negatives include no abdominal pain, bladder incontinence, bowel incontinence, chest pain, dysuria, fever, headaches, numbness, paresis, tingling or weakness. She has tried NSAIDs for the symptoms.    Lab Results  Component Value Date   NA 140 07/17/2021   K 4.7 07/17/2021   CO2 25 07/17/2021   GLUCOSE 92 07/17/2021   BUN 25 07/17/2021   CREATININE 1.20 (H) 07/17/2021   CALCIUM 9.8 07/17/2021   EGFR 46 (L) 07/17/2021   GFRNONAA 64 03/03/2020   Lab Results  Component Value Date   CHOL 251 (H) 07/17/2021   HDL 90 07/17/2021   LDLCALC 144 (H) 07/17/2021   TRIG 100 07/17/2021   CHOLHDL 2.3 05/26/2019   Lab Results  Component Value Date   TSH 1.020 01/16/2021   Lab Results  Component Value Date   HGBA1C 5.4 01/01/2013   Lab Results  Component Value Date   WBC 5.2 07/17/2020   HGB 13.6 07/17/2020   HCT 40.7 07/17/2020   MCV 92 07/17/2020   PLT 177 07/17/2020   Lab Results  Component Value Date   ALT 15 07/17/2021   AST 25 07/17/2021   ALKPHOS 48 07/17/2021   BILITOT 0.7 07/17/2021   No results found for: "25OHVITD2", "25OHVITD3", "VD25OH"   Review of Systems  Constitutional:  Negative for chills and fever.  HENT:  Negative for drooling, ear discharge, ear pain and sore throat.   Respiratory:  Negative for cough, shortness of breath and wheezing.    Cardiovascular:  Negative for chest pain, palpitations and leg swelling.  Gastrointestinal:  Negative for abdominal pain, blood in stool, bowel incontinence, constipation, diarrhea and nausea.  Endocrine: Negative for polydipsia.  Genitourinary:  Negative for bladder incontinence, dysuria, frequency, hematuria and urgency.  Musculoskeletal:  Positive for back pain. Negative for myalgias and neck pain.  Skin:  Negative for rash.  Allergic/Immunologic: Negative for environmental allergies.  Neurological:  Negative for dizziness, tingling, weakness, numbness and headaches.  Hematological:  Does not bruise/bleed easily.  Psychiatric/Behavioral:  Negative for suicidal ideas. The patient is not nervous/anxious.     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   Arthritis 06/06/2014   Aggrieved 06/06/2014   Dermatitis, eczematoid 06/06/2014   Recurrent major depressive episodes ([place]) 06/06/2014   Essential (primary) hypertension 06/06/2014   Gastro-esophageal reflux disease without esophagitis 06/06/2014  Cannot sleep 06/06/2014   Menopause 06/06/2014    No Known Allergies  Past Surgical History:  Procedure Laterality Date   ABDOMINAL HYSTERECTOMY     APPLICATION OF WOUND VAC  11/01/2019   Procedure: APPLICATION OF WOUND VAC;  Surgeon: Herbert Pun, MD;  Location: [facility];  Service: General;;   BREAST BIOPSY Right 06/25/2017   ductal carcinoma in situ involving a  papilloma and encapsulated papillary carcinoma   BREAST BIOPSY Left 10/12/2019   affirm bx of calcs, x marker, path pending   BREAST BIOPSY Left 2006   DCIS   BREAST LUMPECTOMY Right 07/09/2017   DCIS, clear margins   BREAST LUMPECTOMY Left 2006   DCIS   BREAST LUMPECTOMY WITH NEEDLE LOCALIZATION Right 07/09/2017   Procedure: BREAST LUMPECTOMY WITH NEEDLE LOCALIZATION;  Surgeon: Herbert Pun, MD;  Location: [facility];  Service: General;  Laterality: Right;   BREAST RECONSTRUCTION WITH PLACEMENT OF TISSUE EXPANDER AND FLEX HD (ACELLULAR HYDRATED DERMIS) Bilateral 11/01/2019   Procedure: BREAST RECONSTRUCTION WITH PLACEMENT OF TISSUE EXPANDER AND FLEX HD (ACELLULAR HYDRATED DERMIS);  Surgeon: Cindra Presume, MD;  Location: [facility];  Service: Plastics;  Laterality: Bilateral;   BREAST SURGERY     left breast lumpectomy   CARDIOVERSION N/A 03/21/2016   Procedure: Cardioversion;  Surgeon: Corey Skains, MD;  Location: [facility];  Service: Cardiovascular;  Laterality: N/A;   CATARACT EXTRACTION, BILATERAL     DEBRIDEMENT AND CLOSURE WOUND Bilateral 11/26/2019   Procedure: Excision of bilateral mastectomy flap necrosis with complex closure;  Surgeon: Cindra Presume, MD;  Location: [place];  Service: Plastics;  Laterality: Bilateral;  1.5 hours total   LAPAROSCOPIC CHOLECYSTECTOMY     TISSUE EXPANDER PLACEMENT Bilateral 11/26/2019   Procedure: exchange of tissue expanders;  Surgeon: Cindra Presume, MD;  Location: [place];  Service: Plastics;  Laterality: Bilateral;   TISSUE EXPANDER PLACEMENT Bilateral 02/01/2020   Procedure: removal of bilateral expander with closure;  Surgeon: Cindra Presume, MD;  Location: [place];  Service: Plastics;  Laterality: Bilateral;   TONSILLECTOMY     TOTAL MASTECTOMY Bilateral 11/01/2019   Procedure: TOTAL MASTECTOMY w/ immediate reconstruction and Sentinel Node Biopsy;  Surgeon: Herbert Pun, MD;  Location: [facility];  Service: General;  Laterality:  Bilateral;   VAGINAL HYSTERECTOMY      Social History   Tobacco Use   Smoking status: Never   Smokeless tobacco: Never  Vaping Use   Vaping Use: Never used  Substance Use Topics   Alcohol use: Never   Drug use: No     Medication list has been reviewed and updated.  No outpatient medications have been marked as taking for the 10/29/21 encounter (Office Visit) with Juline Patch, MD.       10/29/2021   11:14 AM 07/17/2021   10:54 AM 10/17/2020    2:14 PM 07/14/2020    2:17 PM  GAD 7 : Generalized Anxiety Score  Nervous, Anxious, on Edge 0 1 0 0  Control/stop worrying 0 0 0 0  Worry too much - different things 0 0 0 0  Trouble relaxing 0 0 0 0  Restless 0 2 0 0  Easily annoyed or irritable 0 2 1 0  Afraid - awful might happen 0 0 0 0  Total GAD 7 Score 0 5 1 0  Anxiety Difficulty Not difficult at all Somewhat difficult Not difficult at all        10/29/2021   11:13 AM 07/17/2021  10:54 AM 01/16/2021   11:47 AM  Depression screen PHQ 2/9  Decreased Interest 0 0 0  Down, Depressed, Hopeless 0 0 0  PHQ - 2 Score 0 0 0  Altered sleeping 0 0 0  Tired, decreased energy 0 0 0  Change in appetite 0 3 0  Feeling bad or failure about yourself  0 0 0  Trouble concentrating 0 3 0  Moving slowly or fidgety/restless 0 0 0  Suicidal thoughts 0 0 0  PHQ-9 Score 0 6 0  Difficult doing work/chores Not difficult at all Extremely dIfficult     BP Readings from Last 3 Encounters:  10/29/21 124/76  07/17/21 132/82  01/16/21 124/70    Physical Exam Vitals and nursing note reviewed. Exam conducted with a chaperone present.  Constitutional:      General: She is not in acute distress.    Appearance: She is not diaphoretic.  HENT:     Head: Normocephalic and atraumatic.     Right Ear: Tympanic membrane and external ear normal.     Left Ear: Tympanic membrane and external ear normal.     Nose: Nose normal.     Mouth/Throat:     Mouth: Mucous membranes are moist.  Eyes:      General:        Right eye: No discharge.        Left eye: No discharge.     Conjunctiva/sclera: Conjunctivae normal.     Pupils: Pupils are equal, round, and reactive to light.  Neck:     Thyroid: No thyromegaly.     Vascular: No JVD.  Cardiovascular:     Rate and Rhythm: Normal rate and regular rhythm.     Heart sounds: Normal heart sounds. No murmur heard.    No friction rub. No gallop.  Pulmonary:     Effort: Pulmonary effort is normal.     Breath sounds: Normal breath sounds. No wheezing, rhonchi or rales.  Abdominal:     General: Bowel sounds are normal.     Palpations: Abdomen is soft. There is no mass.     Tenderness: There is no abdominal tenderness. There is no guarding.  Musculoskeletal:        General: Normal range of motion.     Cervical back: Normal range of motion and neck supple.     Lumbar back: Spasms present. No tenderness or bony tenderness. Negative right straight leg raise test and negative left straight leg raise test. Scoliosis present.  Lymphadenopathy:     Cervical: No cervical adenopathy.  Skin:    General: Skin is warm and dry.  Neurological:     Mental Status: She is alert.     Sensory: No sensory deficit.     Motor: No weakness.     Deep Tendon Reflexes: Reflexes are normal and symmetric. Reflexes normal.     Wt Readings from Last 3 Encounters:  10/29/21 226 lb (102.5 kg)  07/17/21 223 lb (101.2 kg)  01/16/21 208 lb (94.3 kg)    BP 124/76   Pulse 68   Ht $R'5\' 5"'mz$  (1.651 m)   Wt 226 lb (102.5 kg)   BMI 37.61 kg/m   Assessment and Plan:  1. Degenerative lumbar disc Chronic.  Persistent.  Episodically causing her some disability but for the most part has been able to do her daily activities.  Unable to walk substantial distances.  Paraspinal spasm noted with mild scoliosis no spinous process tenderness neurologic exam normal.  We  will obtain an LS spine and refer to orthopedic surgery for evaluation in the meantime patient will continue her  Celebrex with occasional supplemental Tylenol.  We will initiate a prednisone taper beginning at 40 mg over 12 days. - DG Lumbar Spine Complete - Ambulatory referral to Orthopedic Surgery - predniSONE (DELTASONE) 10 MG tablet; Take 1 tablet (10 mg total) by mouth daily with breakfast. Taper 4,4,4,3,3,3,2,2,2,1,1,1  Dispense: 30 tablet; Refill: 0  2. Facet arthritis of lumbar region As noted above. - DG Lumbar Spine Complete - Ambulatory referral to Orthopedic Surgery - predniSONE (DELTASONE) 10 MG tablet; Take 1 tablet (10 mg total) by mouth daily with breakfast. Taper 4,4,4,3,3,3,2,2,2,1,1,1  Dispense: 30 tablet; Refill: 0    Otilio Miu, MD

## 2021-10-31 ENCOUNTER — Telehealth: Payer: Self-pay | Admitting: Family Medicine

## 2021-10-31 NOTE — Telephone Encounter (Signed)
Copied from [place] [phone]. Topic: Appointment Scheduling - Scheduling Inquiry for Clinic >> Oct 31, 2021 12:50 PM Tiffany B wrote: Reason for CRM: Patient states she was instructed by Baxter Flattery to call her back if she has not heard from [place], [place].

## 2021-11-08 ENCOUNTER — Other Ambulatory Visit: Payer: Self-pay | Admitting: Family Medicine

## 2021-11-08 DIAGNOSIS — M5136 Other intervertebral disc degeneration, lumbar region: Secondary | ICD-10-CM

## 2021-11-08 DIAGNOSIS — M542 Cervicalgia: Secondary | ICD-10-CM

## 2021-11-08 DIAGNOSIS — M47816 Spondylosis without myelopathy or radiculopathy, lumbar region: Secondary | ICD-10-CM

## 2021-11-08 MED ORDER — CELECOXIB 200 MG PO CAPS: 200.0000 mg | ORAL_CAPSULE | Freq: Every day | ORAL | 0 refills | Status: DC

## 2021-11-08 NOTE — Telephone Encounter (Signed)
Medication Refill - Medication: Celebrex  Has the patient contacted their pharmacy? Yes.   (Agent: If no, request that the patient contact the pharmacy for the refill. If patient does not wish to contact the pharmacy document the reason why and proceed with request.) (Agent: If yes, when and what did the pharmacy advise?)  Preferred Pharmacy (with phone number or street name): [facility] Has the patient been seen for an appointment in the last year OR does the patient have an upcoming appointment? Yes.    Agent: Please be advised that RX refills may take up to 3 business days. We ask that you follow-up with your pharmacy.

## 2021-11-08 NOTE — Telephone Encounter (Signed)
Requested Prescriptions  Pending Prescriptions Disp Refills  . celecoxib (CELEBREX) 200 MG capsule 90 capsule 0    Sig: Take 1 capsule (200 mg total) by mouth at bedtime.     Analgesics:  COX2 Inhibitors Failed - 11/08/2021 10:49 AM      Failed - Manual Review: Labs are only required if the patient has taken medication for more than 8 weeks.      Failed - HGB in normal range and within 360 days    Hemoglobin  Date Value Ref Range Status  07/17/2020 13.6 11.1 - 15.9 g/dL Final         Failed - Cr in normal range and within 360 days    Creatinine, Ser  Date Value Ref Range Status  07/17/2021 1.20 (H) 0.57 - 1.00 mg/dL Final         Failed - HCT in normal range and within 360 days    Hematocrit  Date Value Ref Range Status  07/17/2020 40.7 34.0 - 46.6 % Final         Passed - AST in normal range and within 360 days    AST  Date Value Ref Range Status  07/17/2021 25 0 - 40 IU/L Final         Passed - ALT in normal range and within 360 days    ALT  Date Value Ref Range Status  07/17/2021 15 0 - 32 IU/L Final         Passed - eGFR is 30 or above and within 360 days    GFR calc Af Amer  Date Value Ref Range Status  03/03/2020 74 >59 mL/min/1.73 Final    Comment:    **In accordance with recommendations from the NKF-ASN Task force,**   [facility] is in the process of updating its eGFR calculation to the   2021 CKD-EPI creatinine equation that estimates kidney function   without a race variable.    GFR, Estimated  Date Value Ref Range Status  02/01/2020 53 (L) >60 mL/min Final    Comment:    (NOTE) Calculated using the CKD-EPI Creatinine Equation (2021)    GFR calc non Af Amer  Date Value Ref Range Status  03/03/2020 64 >59 mL/min/1.73 Final   eGFR  Date Value Ref Range Status  07/17/2021 46 (L) >59 mL/min/1.73 Final         Passed - Patient is not pregnant      Passed - Valid encounter within last 12 months    Recent Outpatient Visits          1 week ago  Degenerative lumbar disc   [facility] Jones, Deanna C, MD   3 months ago Essential hypertension   [facility] Jones, Deanna C, MD   9 months ago Hypothyroidism, unspecified type   [facility] [facility] Jones, Deanna C, MD   1 year ago Degenerative lumbar disc   [facility] [facility] Jones, Deanna C, MD   1 year ago Hypothyroidism, unspecified type   [place] Jones, Deanna C, MD      Future Appointments            In 2 months Jones, Deanna C, MD [facility]             

## 2021-11-09 DIAGNOSIS — M47816 Spondylosis without myelopathy or radiculopathy, lumbar region: Secondary | ICD-10-CM | POA: Diagnosis not present

## 2021-11-09 DIAGNOSIS — M47818 Spondylosis without myelopathy or radiculopathy, sacral and sacrococcygeal region: Secondary | ICD-10-CM | POA: Diagnosis not present

## 2021-12-24 ENCOUNTER — Ambulatory Visit: Payer: Medicare Other

## 2021-12-28 ENCOUNTER — Ambulatory Visit (INDEPENDENT_AMBULATORY_CARE_PROVIDER_SITE_OTHER): Payer: Medicare Other

## 2021-12-28 DIAGNOSIS — Z0000 Encounter for general adult medical examination without abnormal findings: Secondary | ICD-10-CM | POA: Diagnosis not present

## 2021-12-28 NOTE — Patient Instructions (Signed)

## 2021-12-28 NOTE — Progress Notes (Signed)
I connected with  Melissa Chase on 12/28/21 by a audio enabled telemedicine application and verified that I am speaking with the correct person using two identifiers.  Patient Location: Home  Provider Location: Office/Clinic  I discussed the limitations of evaluation and management by telemedicine. The patient expressed understanding and agreed to proceed.   Subjective:   Melissa Chase is a 81 y.o. female who presents for Medicare Annual (Subsequent) preventive examination.  Review of Systems    Per HPI unless specifically indicated below.  Cardiac Risk Factors include: advanced age (>4mn, >>53women);female gender, essential hypertension, obesity, and history A-Fib.        Objective:        10/29/2021   11:00 AM 07/17/2021   10:54 AM 01/16/2021   11:33 AM  Vitals with BMI  Height '5\' 5"'$  '5\' 5"'$  '5\' 5"'$   Weight 226 lbs 223 lbs 208 lbs  BMI 37.61 [phone] Systolic [id number] Diastolic 76 82 70  Pulse 68 70 72    There were no vitals filed for this visit. There is no height or weight on file to calculate BMI.     12/28/2021   11:18 AM 12/20/2020   11:33 AM 06/09/2020    1:09 PM 12/13/2019   11:41 AM 12/07/2019   11:41 AM 11/26/2019    7:43 AM 11/01/2019    3:00 PM  Advanced Directives  Does Patient Have a Medical Advance Directive? Yes No Yes Yes No Yes Yes  Type of AMaterials engineerof AFreescale SemiconductorPower of [place] will  [place] Does patient want to make changes to medical advance directive? No - Patient declined  No - Patient declined   No - Patient declined No - Patient declined  Copy of HRoyal Centerin Chart? No - copy requested  No - copy requested No - copy requested  No - copy requested No - copy requested  Would patient like information on creating a medical advance directive?  Yes (MAU/Ambulatory/Procedural Areas  - Information given)         Current Medications (verified) Outpatient Encounter Medications as of 12/28/2021  Medication Sig   acetaminophen (TYLENOL) 500 MG tablet Take by mouth. Take 1,000 mg by mouth every 6 (six) hours Patient takes 500 mg in the am and 1,000 mg in the pm   apixaban (ELIQUIS) 5 MG TABS tablet Take 5 mg by mouth 2 (two) times daily.    calcium-vitamin D (OSCAL WITH D) 500-200 MG-UNIT TABS tablet Take 1 tablet by mouth 2 (two) times daily.    celecoxib (CELEBREX) 200 MG capsule Take 1 capsule (200 mg total) by mouth at bedtime.   diphenhydramine-acetaminophen (TYLENOL PM) 25-500 MG TABS tablet Take 1 tablet by mouth at bedtime.   flecainide (TAMBOCOR) 100 MG tablet Take 50 mg by mouth 2 (two) times daily.   hydrochlorothiazide (MICROZIDE) 12.5 MG capsule TAKE 1 CAPSULE BY MOUTH EVERY DAY   levothyroxine (SYNTHROID) 25 MCG tablet Take 1 tablet (25 mcg total) by mouth daily.   pravastatin (PRAVACHOL) 20 MG tablet Take 1 tablet (20 mg total) by mouth daily.   sertraline (ZOLOFT) 100 MG tablet Take 1 tablet (100 mg total) by mouth daily.   furosemide (LASIX) 20 MG tablet Take 1 tablet by mouth daily.   [DISCONTINUED] predniSONE (DELTASONE) 10 MG tablet Take 1 tablet (10 mg total) by mouth  daily with breakfast. Taper 4,4,4,3,3,3,2,2,2,1,1,1 (Patient not taking: Reported on 12/28/2021)   No facility-administered encounter medications on file as of 12/28/2021.    Allergies (verified) Patient has no known allergies.   History: Past Medical History:  Diagnosis Date   Afib ([place])    Arthritis    Breast cancer ([place]) 2006   left breast   Breast cancer ([place]) 2019   right breast   Cardiomyopathy ([place])    Chronic constipation    Depression    GERD (gastroesophageal reflux disease)    Hypertension    Insomnia    Insomnia    Moderate mitral regurgitation    Moderate tricuspid regurgitation    Personal history of radiation therapy 2006   37 tx.   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   BREAST LUMPECTOMY Right 07/09/2017   DCIS, clear margins   BREAST LUMPECTOMY Left 2006   DCIS   BREAST LUMPECTOMY WITH NEEDLE LOCALIZATION Right 07/09/2017   Procedure: BREAST LUMPECTOMY WITH NEEDLE LOCALIZATION;  Surgeon: Herbert Pun, MD;  Location: [facility];  Service: General;  Laterality: Right;   BREAST RECONSTRUCTION WITH PLACEMENT OF TISSUE EXPANDER AND FLEX HD (ACELLULAR HYDRATED DERMIS) Bilateral 11/01/2019   Procedure: BREAST RECONSTRUCTION WITH PLACEMENT OF TISSUE EXPANDER AND FLEX HD (ACELLULAR HYDRATED DERMIS);  Surgeon: Cindra Presume, MD;  Location: [facility];  Service: Plastics;  Laterality: Bilateral;   BREAST SURGERY     left breast lumpectomy   CARDIOVERSION N/A 03/21/2016   Procedure: Cardioversion;  Surgeon: Corey Skains, MD;  Location: [facility];  Service: Cardiovascular;  Laterality: N/A;   CATARACT EXTRACTION, BILATERAL     DEBRIDEMENT AND CLOSURE WOUND Bilateral 11/26/2019   Procedure: Excision of bilateral mastectomy flap necrosis with complex closure;  Surgeon: Cindra Presume, MD;  Location: [place];  Service: Plastics;  Laterality: Bilateral;  1.5 hours total   LAPAROSCOPIC CHOLECYSTECTOMY     TISSUE EXPANDER PLACEMENT Bilateral 11/26/2019   Procedure: exchange of tissue expanders;  Surgeon: Cindra Presume, MD;  Location: [place];  Service: Plastics;  Laterality: Bilateral;   TISSUE EXPANDER PLACEMENT Bilateral 02/01/2020   Procedure: removal of bilateral expander with  closure;  Surgeon: Cindra Presume, MD;  Location: [place];  Service: Plastics;  Laterality: Bilateral;   TONSILLECTOMY     TOTAL MASTECTOMY Bilateral 11/01/2019   Procedure: TOTAL MASTECTOMY w/ immediate reconstruction and Sentinel Node Biopsy;  Surgeon: Herbert Pun, MD;  Location: [facility];  Service: General;  Laterality: Bilateral;   VAGINAL HYSTERECTOMY     Family History  Problem Relation Age of Onset   Diabetes Sister    Breast cancer Daughter        74's   Social History   Socioeconomic History   Marital status: Widowed    Spouse name: Not on file   Number of children: 3   Years of education: Not on file   Highest education level: Not on file  Occupational History   Occupation: [employer]  Tobacco Use   Smoking status: Never   Smokeless tobacco: Never  Vaping Use   Vaping Use: Never used  Substance and Sexual Activity   Alcohol use: Never   Drug use: No   Sexual activity: Not Currently  Other Topics Concern   Not on file  Social History Narrative   Pt lives with a friend   Social Determinants of Health   Financial Resource Strain: Low Risk  (12/20/2020)   Overall Financial Resource Strain (CARDIA)    Difficulty of Paying Living Expenses: Not hard at all  Food Insecurity: No Food Insecurity (12/28/2021)   Hunger Vital Sign    Worried About Running Out of Food in the Last Year: Never true    Ran Out of Food in the Last Year: Never true  Transportation Needs: No Transportation Needs (12/28/2021)   PRAPARE - Hydrologist (Medical): No    Lack of Transportation (Non-Medical): No  Physical Activity: Insufficiently Active (12/28/2021)   Exercise Vital Sign    Days of Exercise per Week: 2 days    Minutes of Exercise per Session: 30 min  Stress: No Stress Concern Present (12/28/2021)   [place]    Feeling of Stress : Not at all  Social Connections: Socially  Isolated (12/28/2021)   Social Connection and Isolation Panel [NHANES]    Frequency of Communication with Friends and Family: More than three times a week    Frequency of Social Gatherings with Friends and Family: More than three times a week    Attends Religious Services: Never    Marine scientist or Organizations: No    Attends Archivist Meetings: Never    Marital Status: Widowed    Tobacco Counseling Counseling given: No   Clinical Intake:  Pre-visit preparation completed: No  Pain : No/denies pain     Nutritional Status: BMI > 30  Obese Nutritional Risks: Unintentional weight gain Diabetes: No  How often do you need to have someone help you when you read instructions, pamphlets, or other written materials from your doctor or pharmacy?: 1 - Never  Diabetic?No  Interpreter Needed?: No      Activities of Daily Living    12/28/2021   11:30 AM  In your present state of health, do you have any difficulty performing the following activities:  Hearing? 0  Vision? [place]  Difficulty concentrating or making decisions? 1  Walking or climbing stairs? 0  Dressing or bathing? 0  Doing errands, shopping? 0    Patient Care Team: Juline Patch, MD as PCP - General (Family Medicine)  Indicate any recent Medical Services you may have received from other than [facility] providers in the past year (date may be approximate). No  hospitalization in the past 12 months.     Assessment:   This is a routine wellness examination for Melissa Chase.  Hearing/Vision screen Denies any hearing issues. Denies any vision issues. [place].  Dietary issues and exercise activities discussed: Current Exercise Habits: Structured exercise class, Time (Minutes): 30, Frequency (Times/Week): 2, Weekly Exercise (Minutes/Week): 60, Intensity: Mild, Exercise limited by: None identified   Goals Addressed   None    Depression Screen     12/28/2021   11:28 AM 10/29/2021   11:13 AM 07/17/2021   10:54 AM 01/16/2021   11:47 AM 12/20/2020   11:31 AM 10/17/2020    2:13 PM  07/14/2020    2:16 PM  PHQ 2/9 Scores  PHQ - 2 Score 0 0 0 0 0 0 1  PHQ- 9 Score  0 6 0  4 3    Fall Risk    12/28/2021   11:29 AM 10/29/2021   11:13 AM 07/17/2021   10:55 AM 12/20/2020   11:34 AM 12/13/2019   11:42 AM  Fall Risk   Falls in the past year? 0 0 0 1 0  Number falls in past yr: 0 0 0 0 0  Injury with Fall? 0 0 0 0 0  Risk for fall due to : No Fall Risks No Fall Risks No Fall Risks Impaired balance/gait No Fall Risks  Follow up Falls evaluation completed Falls evaluation completed Falls evaluation completed Falls prevention discussed Falls prevention discussed    FALL RISK PREVENTION PERTAINING TO THE HOME:  Any stairs in or around the home? Yes  If so, are there any without handrails? No  Home free of loose throw rugs in walkways, pet beds, electrical cords, etc? Yes  Adequate lighting in your home to reduce risk of falls? Yes   ASSISTIVE DEVICES UTILIZED TO PREVENT FALLS:  Life alert? No  Use of a cane, walker or w/c? No  Grab bars in the bathroom? Yes  Shower chair or bench in shower? No  Elevated toilet seat or a handicapped toilet? No   TIMED UP AND GO:  Was the test performed?  unable to perform, virtual appt  .  Cognitive Function:        12/28/2021   11:31 AM 12/13/2019   11:43 AM 11/18/2018    1:41 PM  6CIT Screen  What Year? 0 points 0 points 0 points  What month? 0 points 0 points 0 points  What time? 0 points 0 points 0 points  Count back from 20 0 points 0 points 0 points  Months in reverse 0 points 4 points 0 points  Repeat phrase 0 points 2 points 2 points  Total Score 0 points 6 points 2 points    Immunizations Immunization History  Administered Date(s) Administered   Fluad Quad(high Dose 65+) 10/17/2020, 10/29/2021   Influenza,inj,Quad PF,6+ Mos 01/01/2013, 12/02/2013, 12/05/2014    Influenza-Unspecified 12/02/2013   Pneumococcal Conjugate-13 10/22/2018    TDAP status: Due, Education has been provided regarding the importance of this vaccine. Advised may receive this vaccine at local pharmacy or Health Dept. Aware to provide a copy of the vaccination record if obtained from local pharmacy or Health Dept. Verbalized acceptance and understanding.  Flu Vaccine status: Up to date  Pneumococcal vaccine status: Up to date  Covid-19 vaccine status: Information provided on how to obtain vaccines.   Qualifies for Shingles Vaccine? Yes   Zostavax completed No   Shingrix Completed?: No.    Education has been provided regarding the importance of this vaccine. Patient has been advised to call insurance company to determine out of pocket expense if they have not yet received this vaccine. Advised may also receive vaccine at local pharmacy or Health Dept. Verbalized acceptance and understanding.  Screening Tests Health Maintenance  Topic Date Due   DTaP/Tdap/Td (1 - Tdap) Never done   Zoster Vaccines- Shingrix (1 of 2) 01/29/2022 (Originally 01/10/1960)   Pneumonia Vaccine 98+ Years old (2 - PPSV23 or PCV20) 07/18/2022 (Originally 10/22/2019)   Hepatitis C Screening  10/30/2022 (Originally 01/11/1959)   Medicare Annual Wellness (AWV)  12/29/2022   INFLUENZA VACCINE  Completed   DEXA SCAN  Completed   HPV VACCINES  Aged Out   COVID-19 Vaccine  Discontinued    Health Maintenance  Health Maintenance Due  Topic Date Due   DTaP/Tdap/Td (1 - Tdap) Never done    Colorectal cancer screening: No longer required.   Mammogram status: No longer required due to age.  DEXa scan: completed 10/07/2019  Lung Cancer Screening: (Low Dose CT Chest recommended if Age 33-80 years, 30 pack-year currently smoking OR have quit w/in 15years.) does not qualify.   Lung Cancer Screening Referral: not applicable   Additional Screening:  Hepatitis C Screening: does not qualify;  Vision  Screening: Recommended annual ophthalmology exams for early detection of glaucoma and other disorders of the eye. Is the patient up to date with their annual eye exam?  Yes  Who is the provider or what is the name of the office in which the patient attends annual eye exams? [facility]  If pt is not established with a provider, would they like to be referred to a provider to establish care? No .   Dental Screening: Recommended annual dental exams for proper oral hygiene  Community Resource Referral / Chronic Care Management: CRR required this visit?  No   CCM required this visit?  No      Plan:     I have personally reviewed and noted the following in the patient's chart:   Medical and social history Use of alcohol, tobacco or illicit drugs  Current medications and supplements including opioid prescriptions. Patient is not currently taking opioid prescriptions. Functional ability and status Nutritional status Physical activity Advanced directives List of other physicians Hospitalizations, surgeries, and ER visits in previous 12 months Vitals Screenings to include cognitive, depression, and falls Referrals and appointments  In addition, I have reviewed and discussed with patient certain preventive protocols, quality metrics, and best practice recommendations. A written personalized care plan for preventive services as well as general preventive health recommendations were provided to patient.     Melissa Chase , Thank you for taking time to come for your Medicare Wellness Visit. I appreciate your ongoing commitment to your health goals. Please review the following plan we discussed and let me know if I can assist you in the future.   These are the goals we discussed:  Goals      DIET - INCREASE WATER INTAKE     Recommend drinking 6-8 glasses of water per day        This is a list of the screening recommended for you and due dates:  Health Maintenance  Topic Date  Due   DTaP/Tdap/Td vaccine (1 - Tdap) Never done   Zoster (Shingles) Vaccine (1 of 2) 01/29/2022*   Pneumonia Vaccine (2 - PPSV23 or PCV20) 07/18/2022*   Hepatitis C Screening: USPSTF Recommendation to screen - Ages 18-79 yo.  10/30/2022*   Medicare Annual Wellness Visit  12/29/2022   Flu Shot  Completed   DEXA scan (bone density measurement)  Completed   HPV Vaccine  Aged Out   COVID-19 Vaccine  Discontinued  *Topic was postponed. The date shown is not the original due date.    Wilson Singer, [place]   12/28/2021   Nurse Notes: Approximately 30 minute Non-Face -To-Face Medicare Wellness Visit

## 2022-01-18 ENCOUNTER — Encounter: Payer: Self-pay | Admitting: Family Medicine

## 2022-01-18 ENCOUNTER — Ambulatory Visit (INDEPENDENT_AMBULATORY_CARE_PROVIDER_SITE_OTHER): Payer: Medicare Other | Admitting: Family Medicine

## 2022-01-18 VITALS — BP 120/68 | HR 56 | Ht 65.0 in | Wt 231.0 lb

## 2022-01-18 DIAGNOSIS — M5136 Other intervertebral disc degeneration, lumbar region: Secondary | ICD-10-CM | POA: Diagnosis not present

## 2022-01-18 DIAGNOSIS — M542 Cervicalgia: Secondary | ICD-10-CM | POA: Diagnosis not present

## 2022-01-18 DIAGNOSIS — E039 Hypothyroidism, unspecified: Secondary | ICD-10-CM

## 2022-01-18 DIAGNOSIS — I10 Essential (primary) hypertension: Secondary | ICD-10-CM

## 2022-01-18 DIAGNOSIS — M47816 Spondylosis without myelopathy or radiculopathy, lumbar region: Secondary | ICD-10-CM | POA: Diagnosis not present

## 2022-01-18 DIAGNOSIS — E785 Hyperlipidemia, unspecified: Secondary | ICD-10-CM

## 2022-01-18 DIAGNOSIS — F339 Major depressive disorder, recurrent, unspecified: Secondary | ICD-10-CM

## 2022-01-18 DIAGNOSIS — I420 Dilated cardiomyopathy: Secondary | ICD-10-CM | POA: Diagnosis not present

## 2022-01-18 DIAGNOSIS — E7849 Other hyperlipidemia: Secondary | ICD-10-CM

## 2022-01-18 MED ORDER — SERTRALINE HCL 100 MG PO TABS: 100.0000 mg | ORAL_TABLET | Freq: Every day | ORAL | 1 refills | Status: DC

## 2022-01-18 MED ORDER — CELECOXIB 200 MG PO CAPS: 200.0000 mg | ORAL_CAPSULE | Freq: Every day | ORAL | 0 refills | Status: DC

## 2022-01-18 MED ORDER — PRAVASTATIN SODIUM 20 MG PO TABS: 20.0000 mg | ORAL_TABLET | Freq: Every day | ORAL | 1 refills | Status: DC

## 2022-01-18 MED ORDER — HYDROCHLOROTHIAZIDE 12.5 MG PO CAPS: ORAL_CAPSULE | ORAL | 1 refills | Status: DC

## 2022-01-18 MED ORDER — LEVOTHYROXINE SODIUM 25 MCG PO TABS: 25.0000 ug | ORAL_TABLET | Freq: Every day | ORAL | 1 refills | Status: DC

## 2022-01-18 NOTE — Progress Notes (Signed)
Date:  01/18/2022   Name:  Melissa Chase   DOB:  28-Jul-1940   MRN:  [id number]   Chief Complaint: Hypertension, Hyperlipidemia, Depression, Hypothyroidism, and Arthritis  Hypertension This is a chronic problem. The current episode started more than 1 year ago. The problem has been gradually improving since onset. The problem is controlled. Pertinent negatives include no anxiety, blurred vision, chest pain, headaches, malaise/fatigue, neck pain, orthopnea, palpitations, peripheral edema, PND, shortness of breath or sweats. There are no associated agents to hypertension. There are no known risk factors for coronary artery disease. Past treatments include diuretics. There are no compliance problems.  There is no history of angina, kidney disease, CAD/MI, CVA, heart failure, left ventricular hypertrophy, PVD or retinopathy. Identifiable causes of hypertension include a thyroid problem. There is no history of chronic renal disease, a hypertension causing med or renovascular disease.  Hyperlipidemia This is a chronic problem. The current episode started more than 1 year ago. The problem is controlled. Recent lipid tests were reviewed and are normal. She has no history of chronic renal disease, diabetes, hypothyroidism, liver disease, obesity or nephrotic syndrome. Factors aggravating her hyperlipidemia include thiazides. Pertinent negatives include no chest pain, myalgias or shortness of breath. Current antihyperlipidemic treatment includes statins. The current treatment provides moderate improvement of lipids. There are no compliance problems.   Depression        This is a chronic problem.  The problem has been gradually improving since onset.  Associated symptoms include no decreased concentration, no fatigue, no helplessness, no hopelessness, does not have insomnia, not irritable, no restlessness, no decreased interest, no appetite change, no body aches, no myalgias, no headaches, no indigestion,  not sad and no suicidal ideas.  Past treatments include SSRIs - Selective serotonin reuptake inhibitors.  Compliance with treatment is variable.  Past medical history includes thyroid problem.     Pertinent negatives include no hypothyroidism and no anxiety. Arthritis Presents for follow-up visit. She reports no pain, stiffness, joint swelling or joint warmth. Pertinent negatives include no fatigue or weight loss.  Thyroid Problem Presents for follow-up visit. Patient reports no cold intolerance, constipation, depressed mood, dry skin, fatigue, hair loss, nail problem, palpitations, weight gain or weight loss. Her past medical history is significant for hyperlipidemia. There is no history of diabetes or heart failure.    Lab Results  Component Value Date   NA 140 07/17/2021   K 4.7 07/17/2021   CO2 25 07/17/2021   GLUCOSE 92 07/17/2021   BUN 25 07/17/2021   CREATININE 1.20 (H) 07/17/2021   CALCIUM 9.8 07/17/2021   EGFR 46 (L) 07/17/2021   GFRNONAA 64 03/03/2020   Lab Results  Component Value Date   CHOL 251 (H) 07/17/2021   HDL 90 07/17/2021   LDLCALC 144 (H) 07/17/2021   TRIG 100 07/17/2021   CHOLHDL 2.3 05/26/2019   Lab Results  Component Value Date   TSH 1.020 01/16/2021   Lab Results  Component Value Date   HGBA1C 5.4 01/01/2013   Lab Results  Component Value Date   WBC 5.2 07/17/2020   HGB 13.6 07/17/2020   HCT 40.7 07/17/2020   MCV 92 07/17/2020   PLT 177 07/17/2020   Lab Results  Component Value Date   ALT 15 07/17/2021   AST 25 07/17/2021   ALKPHOS 48 07/17/2021   BILITOT 0.7 07/17/2021   No results found for: "25OHVITD2", "25OHVITD3", "VD25OH"   Review of Systems  Constitutional:  Negative for appetite change, fatigue,  malaise/fatigue, weight gain and weight loss.  HENT:  Negative for postnasal drip and rhinorrhea.   Eyes:  Negative for blurred vision.  Respiratory:  Negative for shortness of breath.   Cardiovascular:  Negative for chest pain,  palpitations, orthopnea and PND.  Gastrointestinal:  Negative for abdominal distention and constipation.  Endocrine: Negative for cold intolerance.  Musculoskeletal:  Positive for arthritis. Negative for joint swelling, myalgias, neck pain and stiffness.  Neurological:  Negative for headaches.  Psychiatric/Behavioral:  Positive for depression. Negative for decreased concentration and suicidal ideas. The patient does not have insomnia.     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   Arthritis 06/06/2014   Aggrieved 06/06/2014   Dermatitis, eczematoid 06/06/2014   Recurrent major depressive episodes ([place]) 06/06/2014   Essential (primary) hypertension 06/06/2014   Gastro-esophageal reflux disease without esophagitis 06/06/2014   Cannot sleep 06/06/2014   Menopause 06/06/2014    No Known Allergies  Past Surgical History:  Procedure Laterality Date   ABDOMINAL HYSTERECTOMY     APPLICATION OF WOUND VAC  11/01/2019   Procedure: APPLICATION OF WOUND VAC;  Surgeon: Herbert Pun, MD;  Location: [facility];  Service: General;;   BREAST BIOPSY Right 06/25/2017   ductal carcinoma in situ involving a papilloma and encapsulated papillary carcinoma   BREAST BIOPSY Left 10/12/2019   affirm bx of calcs, x marker, path pending   BREAST BIOPSY Left 2006   DCIS   BREAST LUMPECTOMY Right 07/09/2017   DCIS, clear margins   BREAST LUMPECTOMY Left 2006   DCIS   BREAST LUMPECTOMY  WITH NEEDLE LOCALIZATION Right 07/09/2017   Procedure: BREAST LUMPECTOMY WITH NEEDLE LOCALIZATION;  Surgeon: Herbert Pun, MD;  Location: [facility];  Service: General;  Laterality: Right;   BREAST RECONSTRUCTION WITH PLACEMENT OF TISSUE EXPANDER AND FLEX HD (ACELLULAR HYDRATED DERMIS) Bilateral 11/01/2019   Procedure: BREAST RECONSTRUCTION WITH PLACEMENT OF TISSUE EXPANDER AND FLEX HD (ACELLULAR HYDRATED DERMIS);  Surgeon: Cindra Presume, MD;  Location: [facility];  Service: Plastics;  Laterality: Bilateral;   BREAST SURGERY     left breast lumpectomy   CARDIOVERSION N/A 03/21/2016   Procedure: Cardioversion;  Surgeon: Corey Skains, MD;  Location: [facility];  Service: Cardiovascular;  Laterality: N/A;   CATARACT EXTRACTION, BILATERAL     DEBRIDEMENT AND CLOSURE WOUND Bilateral 11/26/2019   Procedure: Excision of bilateral mastectomy flap necrosis with complex closure;  Surgeon: Cindra Presume, MD;  Location: [place];  Service: Plastics;  Laterality: Bilateral;  1.5 hours total   LAPAROSCOPIC CHOLECYSTECTOMY     TISSUE EXPANDER PLACEMENT Bilateral 11/26/2019   Procedure: exchange of tissue expanders;  Surgeon: Cindra Presume, MD;  Location: [place];  Service: Plastics;  Laterality: Bilateral;   TISSUE EXPANDER PLACEMENT Bilateral 02/01/2020   Procedure: removal of bilateral expander with closure;  Surgeon: Cindra Presume, MD;  Location: [place];  Service: Plastics;  Laterality: Bilateral;   TONSILLECTOMY     TOTAL MASTECTOMY Bilateral 11/01/2019   Procedure: TOTAL MASTECTOMY w/ immediate reconstruction and  Sentinel Node Biopsy;  Surgeon: Herbert Pun, MD;  Location: [facility];  Service: General;  Laterality: Bilateral;   VAGINAL HYSTERECTOMY      Social History   Tobacco Use   Smoking status: Never   Smokeless tobacco: Never  Vaping Use   Vaping Use: Never used  Substance Use Topics   Alcohol use: Never   Drug use: No     Medication list has been reviewed and  updated.  Current Meds  Medication Sig   acetaminophen (TYLENOL) 500 MG tablet Take by mouth. Take 1,000 mg by mouth every 6 (six) hours Patient takes 500 mg in the am and 1,000 mg in the pm   apixaban (ELIQUIS) 5 MG TABS tablet Take 5 mg by mouth 2 (two) times daily.    calcium-vitamin D (OSCAL WITH D) 500-200 MG-UNIT TABS tablet Take 1 tablet by mouth 2 (two) times daily.    celecoxib (CELEBREX) 200 MG capsule Take 1 capsule (200 mg total) by mouth at bedtime.   diphenhydramine-acetaminophen (TYLENOL PM) 25-500 MG TABS tablet Take 1 tablet by mouth at bedtime.   flecainide (TAMBOCOR) 100 MG tablet Take 50 mg by mouth 2 (two) times daily.   furosemide (LASIX) 20 MG tablet Take 1 tablet by mouth daily.   hydrochlorothiazide (MICROZIDE) 12.5 MG capsule TAKE 1 CAPSULE BY MOUTH EVERY DAY   levothyroxine (SYNTHROID) 25 MCG tablet Take 1 tablet (25 mcg total) by mouth daily.   pravastatin (PRAVACHOL) 20 MG tablet Take 1 tablet (20 mg total) by mouth daily.   sertraline (ZOLOFT) 100 MG tablet Take 1 tablet (100 mg total) by mouth daily.       01/18/2022   10:45 AM 10/29/2021   11:14 AM 07/17/2021   10:54 AM 10/17/2020    2:14 PM  GAD 7 : Generalized Anxiety Score  Nervous, Anxious, on Edge 0 0 1 0  Control/stop worrying 0 0 0 0  Worry too much - different things 0 0 0 0  Trouble relaxing 0 0 0 0  Restless 0 0 2 0  Easily annoyed or irritable 0 0 2 1  Afraid - awful might happen 0 0 0 0  Total GAD 7 Score 0 0 5 1  Anxiety Difficulty Not difficult at all Not difficult at all Somewhat difficult Not difficult at all       01/18/2022   10:45 AM 12/28/2021   11:28 AM 10/29/2021   11:13 AM  Depression screen PHQ 2/9  Decreased Interest 0 0 0  Down, Depressed, Hopeless 2 0 0  PHQ - 2 Score 2 0 0  Altered sleeping 0  0  Tired, decreased energy 0  0  Change in appetite 0  0  Feeling bad or failure about yourself  0  0  Trouble concentrating 0  0  Moving slowly or fidgety/restless 0  0   Suicidal thoughts 0  0  PHQ-9 Score 2  0  Difficult doing work/chores Somewhat difficult  Not difficult at all    BP Readings from Last 3 Encounters:  01/18/22 120/68  10/29/21 124/76  07/17/21 132/82    Physical Exam Vitals and nursing note reviewed.  Constitutional:      General: She is not irritable.    Appearance: She is well-developed.  HENT:     Head: Normocephalic.     Right Ear: Tympanic membrane and external ear normal.     Left Ear: Tympanic membrane and external ear normal.     Mouth/Throat:  Mouth: Mucous membranes are moist.  Eyes:     General: Lids are everted, no foreign bodies appreciated. No scleral icterus.       Left eye: No foreign body or hordeolum.     Conjunctiva/sclera: Conjunctivae normal.     Right eye: Right conjunctiva is not injected.     Left eye: Left conjunctiva is not injected.     Pupils: Pupils are equal, round, and reactive to light.  Neck:     Thyroid: No thyromegaly.     Vascular: No JVD.     Trachea: No tracheal deviation.  Cardiovascular:     Rate and Rhythm: Normal rate and regular rhythm.     Heart sounds: Normal heart sounds. No murmur heard.    No friction rub. No gallop.  Pulmonary:     Effort: Pulmonary effort is normal. No respiratory distress.     Breath sounds: Normal breath sounds. No wheezing, rhonchi or rales.  Abdominal:     General: Bowel sounds are normal.     Palpations: Abdomen is soft. There is no mass.     Tenderness: There is no abdominal tenderness. There is no guarding or rebound.  Musculoskeletal:        General: No tenderness. Normal range of motion.     Cervical back: Normal range of motion and neck supple.  Lymphadenopathy:     Cervical: No cervical adenopathy.  Skin:    General: Skin is warm.     Findings: No rash.  Neurological:     Mental Status: She is alert and oriented to person, place, and time.     Cranial Nerves: No cranial nerve deficit.     Deep Tendon Reflexes: Reflexes normal.   Psychiatric:        Mood and Affect: Mood is not anxious or depressed.    Wt Readings from Last 3 Encounters:  01/18/22 231 lb (104.8 kg)  10/29/21 226 lb (102.5 kg)  07/17/21 223 lb (101.2 kg)    BP 120/68   Pulse (!) 56   Ht _0  (1.651 m)   Wt 231 lb (104.8 kg)   SpO2 97%   BMI 38.44 kg/m   Assessment and Plan:  1. Essential hypertension Chronic.  Controlled.  Stable.  Continue hydrochlorothiazide 12.5 mg once a day.  Will check renal function panel for electrolytes and GFR. - hydrochlorothiazide (MICROZIDE) 12.5 MG capsule; TAKE 1 CAPSULE BY MOUTH EVERY DAY  Dispense: 90 capsule; Refill: 1 - Renal Function Panel  2. Hypothyroidism, unspecified type Chronic.  Controlled.  Stable.  Check thyroid panel with TSH and current me stable on dose we will continue Synthroid 25 mcg daily. - levothyroxine (SYNTHROID) 25 MCG tablet; Take 1 tablet (25 mcg total) by mouth daily.  Dispense: 90 tablet; Refill: 1 - Thyroid Panel With TSH  3. Familial multiple lipoprotein-type hyperlipidemia Chronic.  Controlled.  Stable.  Continue pravastatin 20 mg once a day. - pravastatin (PRAVACHOL) 20 MG tablet; Take 1 tablet (20 mg total) by mouth daily.  Dispense: 90 tablet; Refill: 1  4. Neck pain .  Episodic.  Secondary to facet arthritis of the lumbar and cervical areas.  Will continue Celebrex 200 mg once a day. - celecoxib (CELEBREX) 200 MG capsule; Take 1 capsule (200 mg total) by mouth at bedtime.  Dispense: 90 capsule; Refill: 0  5. Facet arthritis of lumbar region As noted above. - celecoxib (CELEBREX) 200 MG capsule; Take 1 capsule (200 mg total) by mouth at bedtime.  Dispense:  90 capsule; Refill: 0  6. Degenerative lumbar disc As noted above. - celecoxib (CELEBREX) 200 MG capsule; Take 1 capsule (200 mg total) by mouth at bedtime.  Dispense: 90 capsule; Refill: 0  7. Dilated cardiomyopathy (HCC) Chronic.  Controlled.  Stable.  Continue hydrochlorothiazide 12.5 mg once a day. -  hydrochlorothiazide (MICROZIDE) 12.5 MG capsule; TAKE 1 CAPSULE BY MOUTH EVERY DAY  Dispense: 90 capsule; Refill: 1  8. Dyslipidemia As noted above. - pravastatin (PRAVACHOL) 20 MG tablet; Take 1 tablet (20 mg total) by mouth daily.  Dispense: 90 tablet; Refill: 1  9. Recurrent major depressive episodes (HCC) Chronic.  Controlled.  Stable.  PHQ 2 is 2 Gad score is 0 continue sertraline 100 mg daily this is down from 200 mg but she is tolerating this and doing well. - sertraline (ZOLOFT) 100 MG tablet; Take 1 tablet (100 mg total) by mouth daily.  Dispense: 90 tablet; Refill: 1    Otilio Miu, MD

## 2022-01-19 LAB — RENAL FUNCTION PANEL
Albumin: 4.4 g/dL (ref 3.8–4.8)
BUN/Creatinine Ratio: 16 (ref 12–28)
BUN: 26 mg/dL (ref 8–27)
CO2: 27 mmol/L (ref 20–29)
Calcium: 9.5 mg/dL (ref 8.7–10.3)
Chloride: 101 mmol/L (ref 96–106)
Creatinine, Ser: 1.62 mg/dL — ABNORMAL HIGH (ref 0.57–1.00)
Glucose: 96 mg/dL (ref 70–99)
Phosphorus: 3.5 mg/dL (ref 3.0–4.3)
Potassium: 4 mmol/L (ref 3.5–5.2)
Sodium: 142 mmol/L (ref 134–144)
eGFR: 32 mL/min/{1.73_m2} — ABNORMAL LOW (ref >59)

## 2022-01-19 LAB — THYROID PANEL WITH TSH
Free Thyroxine Index: 1.7 (ref 1.2–4.9)
T3 Uptake Ratio: 25 % (ref 24–39)
T4, Total: 6.8 ug/dL (ref 4.5–12.0)
TSH: 2.19 u[IU]/mL (ref 0.450–4.500)

## 2022-01-25 ENCOUNTER — Telehealth: Payer: Self-pay | Admitting: Family Medicine

## 2022-01-25 NOTE — Telephone Encounter (Signed)
Copied from [place] [phone]. Topic: General - Inquiry >> Jan 25, 2022 12:49 PM Devoria Glassing wrote: Reason for CRM: pt wants to know when to come in for her lab appt?  She said she was given a specific time

## 2022-02-05 ENCOUNTER — Other Ambulatory Visit: Payer: Self-pay

## 2022-02-05 DIAGNOSIS — E860 Dehydration: Secondary | ICD-10-CM | POA: Diagnosis not present

## 2022-02-05 NOTE — Progress Notes (Signed)
Lab printed 

## 2022-02-06 LAB — RENAL FUNCTION PANEL
Albumin: 4.3 g/dL (ref 3.8–4.8)
BUN/Creatinine Ratio: 20 (ref 12–28)
BUN: 26 mg/dL (ref 8–27)
CO2: 23 mmol/L (ref 20–29)
Calcium: 9.5 mg/dL (ref 8.7–10.3)
Chloride: 103 mmol/L (ref 96–106)
Creatinine, Ser: 1.31 mg/dL — ABNORMAL HIGH (ref 0.57–1.00)
Glucose: 123 mg/dL — ABNORMAL HIGH (ref 70–99)
Phosphorus: 2.9 mg/dL — ABNORMAL LOW (ref 3.0–4.3)
Potassium: 4.5 mmol/L (ref 3.5–5.2)
Sodium: 142 mmol/L (ref 134–144)
eGFR: 41 mL/min/{1.73_m2} — ABNORMAL LOW (ref >59)

## 2022-02-07 DIAGNOSIS — R0683 Snoring: Secondary | ICD-10-CM | POA: Diagnosis not present

## 2022-02-07 DIAGNOSIS — E559 Vitamin D deficiency, unspecified: Secondary | ICD-10-CM | POA: Diagnosis not present

## 2022-02-07 DIAGNOSIS — Z6841 Body Mass Index (BMI) 40.0 and over, adult: Secondary | ICD-10-CM | POA: Diagnosis not present

## 2022-02-07 DIAGNOSIS — I480 Paroxysmal atrial fibrillation: Secondary | ICD-10-CM | POA: Diagnosis not present

## 2022-02-07 DIAGNOSIS — Z820 Family history of epilepsy and other diseases of the nervous system: Secondary | ICD-10-CM | POA: Diagnosis not present

## 2022-02-07 DIAGNOSIS — E538 Deficiency of other specified B group vitamins: Secondary | ICD-10-CM | POA: Diagnosis not present

## 2022-02-07 DIAGNOSIS — R4189 Other symptoms and signs involving cognitive functions and awareness: Secondary | ICD-10-CM | POA: Diagnosis not present

## 2022-02-07 DIAGNOSIS — G3184 Mild cognitive impairment, so stated: Secondary | ICD-10-CM | POA: Diagnosis not present

## 2022-02-12 ENCOUNTER — Other Ambulatory Visit (HOSPITAL_COMMUNITY): Payer: Self-pay | Admitting: Neurology

## 2022-02-12 DIAGNOSIS — G3184 Mild cognitive impairment, so stated: Secondary | ICD-10-CM

## 2022-02-12 DIAGNOSIS — Z820 Family history of epilepsy and other diseases of the nervous system: Secondary | ICD-10-CM

## 2022-02-17 ENCOUNTER — Ambulatory Visit (HOSPITAL_COMMUNITY)
Admission: RE | Admit: 2022-02-17 | Discharge: 2022-02-17 | Disposition: A | Discharge status: Home / Self Care | Payer: Medicare HMO | Source: Ambulatory Visit | Attending: Neurology | Admitting: Neurology

## 2022-02-17 DIAGNOSIS — Z820 Family history of epilepsy and other diseases of the nervous system: Secondary | ICD-10-CM

## 2022-02-17 DIAGNOSIS — G3184 Mild cognitive impairment, so stated: Secondary | ICD-10-CM

## 2022-02-17 MED ORDER — GADOBUTROL 1 MMOL/ML IV SOLN
10.0000 mL | Freq: Once | INTRAVENOUS | Status: AC | PRN
  Administered 2022-02-17: 10 mL via INTRAVENOUS

## 2022-03-04 ENCOUNTER — Telehealth: Payer: Self-pay | Admitting: Family Medicine

## 2022-03-04 NOTE — Telephone Encounter (Signed)
Copied from [place] [phone]. Topic: General - Other >> Mar 04, 2022  9:22 AM Ludger Nutting wrote: Patient's daughter, [name] would like a call back from [facility] Flattery to discuss patient.

## 2022-04-27 ENCOUNTER — Other Ambulatory Visit: Payer: Self-pay | Admitting: Family Medicine

## 2022-04-27 DIAGNOSIS — M5136 Other intervertebral disc degeneration, lumbar region: Secondary | ICD-10-CM

## 2022-04-27 DIAGNOSIS — M542 Cervicalgia: Secondary | ICD-10-CM

## 2022-04-27 DIAGNOSIS — M47816 Spondylosis without myelopathy or radiculopathy, lumbar region: Secondary | ICD-10-CM

## 2022-04-29 NOTE — Telephone Encounter (Signed)
Requested medications are due for refill today.  yes  Requested medications are on the active medications list.  yes  Last refill. 01/11/2022   Future visit scheduled.   yes  Notes to clinic.  Labs are expired.    Requested Prescriptions  Pending Prescriptions Disp Refills   celecoxib (CELEBREX) 200 MG capsule [Pharmacy Med Name: CELECOXIB 200 MG CAPSULE] 90 capsule 0    Sig: TAKE 1 CAPSULE BY MOUTH AT BEDTIME.     Analgesics:  COX2 Inhibitors Failed - 04/27/2022  8:47 AM      Failed - Manual Review: Labs are only required if the patient has taken medication for more than 8 weeks.      Failed - HGB in normal range and within 360 days    Hemoglobin  Date Value Ref Range Status  07/17/2020 13.6 11.1 - 15.9 g/dL Final         Failed - Cr in normal range and within 360 days    Creatinine, Ser  Date Value Ref Range Status  02/05/2022 1.31 (H) 0.57 - 1.00 mg/dL Final         Failed - HCT in normal range and within 360 days    Hematocrit  Date Value Ref Range Status  07/17/2020 40.7 34.0 - 46.6 % Final         Passed - AST in normal range and within 360 days    AST  Date Value Ref Range Status  07/17/2021 25 0 - 40 IU/L Final         Passed - ALT in normal range and within 360 days    ALT  Date Value Ref Range Status  07/17/2021 15 0 - 32 IU/L Final         Passed - eGFR is 30 or above and within 360 days    GFR calc Af Amer  Date Value Ref Range Status  03/03/2020 74 >59 mL/min/1.73 Final    Comment:    **In accordance with recommendations from the NKF-ASN Task force,**   [facility] is in the process of updating its eGFR calculation to the   2021 CKD-EPI creatinine equation that estimates kidney function   without a race variable.    GFR, Estimated  Date Value Ref Range Status  02/01/2020 53 (L) >60 mL/min Final    Comment:    (NOTE) Calculated using the CKD-EPI Creatinine Equation (2021)    GFR calc non Af Amer  Date Value Ref Range Status  03/03/2020 64  >59 mL/min/1.73 Final   eGFR  Date Value Ref Range Status  02/05/2022 41 (L) >59 mL/min/1.73 Final         Passed - Patient is not pregnant      Passed - Valid encounter within last 12 months    Recent Outpatient Visits           3 months ago Essential hypertension   [facility] Deanna C, [facility]   6 months ago Degenerative lumbar disc   [facility] [facility] Deanna C, MD   9 months ago Essential hypertension   [place] [facility] Deanna C, MD   1 year ago Hypothyroidism, unspecified type   [facility] De[facility] C, MD   1 year ago Degenerative lumbar disc   [place] [facility], MD       Future Appointments             In 2 months Duanne Limerick, MD [facility] at [facility], [facility]

## 2022-05-09 DIAGNOSIS — R0683 Snoring: Secondary | ICD-10-CM | POA: Diagnosis not present

## 2022-05-09 DIAGNOSIS — G309 Alzheimer's disease, unspecified: Secondary | ICD-10-CM | POA: Diagnosis not present

## 2022-05-09 DIAGNOSIS — I480 Paroxysmal atrial fibrillation: Secondary | ICD-10-CM | POA: Diagnosis not present

## 2022-05-09 DIAGNOSIS — R69 Illness, unspecified: Secondary | ICD-10-CM | POA: Diagnosis not present

## 2022-07-19 ENCOUNTER — Ambulatory Visit (INDEPENDENT_AMBULATORY_CARE_PROVIDER_SITE_OTHER): Payer: Medicare HMO | Admitting: Family Medicine

## 2022-07-19 ENCOUNTER — Encounter: Payer: Self-pay | Admitting: Family Medicine

## 2022-07-19 VITALS — BP 122/64 | HR 54 | Ht 65.0 in | Wt 234.0 lb

## 2022-07-19 DIAGNOSIS — I10 Essential (primary) hypertension: Secondary | ICD-10-CM | POA: Diagnosis not present

## 2022-07-19 DIAGNOSIS — E7849 Other hyperlipidemia: Secondary | ICD-10-CM | POA: Diagnosis not present

## 2022-07-19 DIAGNOSIS — F339 Major depressive disorder, recurrent, unspecified: Secondary | ICD-10-CM | POA: Diagnosis not present

## 2022-07-19 DIAGNOSIS — I420 Dilated cardiomyopathy: Secondary | ICD-10-CM

## 2022-07-19 DIAGNOSIS — E785 Hyperlipidemia, unspecified: Secondary | ICD-10-CM

## 2022-07-19 DIAGNOSIS — H00011 Hordeolum externum right upper eyelid: Secondary | ICD-10-CM

## 2022-07-19 DIAGNOSIS — E039 Hypothyroidism, unspecified: Secondary | ICD-10-CM | POA: Diagnosis not present

## 2022-07-19 MED ORDER — SERTRALINE HCL 100 MG PO TABS: 100.0000 mg | ORAL_TABLET | Freq: Every day | ORAL | 1 refills | Status: DC

## 2022-07-19 MED ORDER — PRAVASTATIN SODIUM 20 MG PO TABS
20.0000 mg | ORAL_TABLET | Freq: Every day | ORAL | 1 refills | Status: DC
Start: 2022-07-19 — End: 2022-11-11

## 2022-07-19 MED ORDER — LEVOTHYROXINE SODIUM 25 MCG PO TABS
25.0000 ug | ORAL_TABLET | Freq: Every day | ORAL | 1 refills | Status: DC
Start: 2022-07-19 — End: 2022-11-15

## 2022-07-19 MED ORDER — HYDROCHLOROTHIAZIDE 12.5 MG PO CAPS: ORAL_CAPSULE | ORAL | 1 refills | Status: DC

## 2022-07-19 NOTE — Progress Notes (Signed)
Date:  07/19/2022   Name:  Melissa Chase   DOB:  11/24/1940   MRN:  [id number]   Chief Complaint: Hypertension, Hyperlipidemia, Hypothyroidism, and Depression  Hypertension This is a chronic problem. The current episode started more than 1 year ago. The problem has been waxing and waning since onset. The problem is controlled. Pertinent negatives include no blurred vision, chest pain, headaches, orthopnea, palpitations, PND or shortness of breath. There are no associated agents to hypertension. Risk factors for coronary artery disease include dyslipidemia. Past treatments include diuretics. The current treatment provides moderate improvement. There are no compliance problems.  There is no history of CAD/MI or CVA. There is no history of chronic renal disease, a hypertension causing med or renovascular disease.  Hyperlipidemia This is a chronic problem. The current episode started more than 1 year ago. The problem is controlled. Recent lipid tests were reviewed and are normal. She has no history of chronic renal disease, diabetes or obesity. Pertinent negatives include no chest pain, myalgias or shortness of breath. Current antihyperlipidemic treatment includes statins. The current treatment provides mild improvement of lipids. There are no compliance problems.   Depression        The current episode started more than 1 year ago.   The onset quality is gradual.   The problem has been gradually improving since onset.  Associated symptoms include no restlessness, no myalgias and no headaches.  Past treatments include SSRIs - Selective serotonin reuptake inhibitors.  Previous treatment provided moderate relief.   Lab Results  Component Value Date   NA 142 02/05/2022   K 4.5 02/05/2022   CO2 23 02/05/2022   GLUCOSE 123 (H) 02/05/2022   BUN 26 02/05/2022   CREATININE 1.31 (H) 02/05/2022   CALCIUM 9.5 02/05/2022   EGFR 41 (L) 02/05/2022   GFRNONAA 64 03/03/2020   Lab Results  Component  Value Date   CHOL 251 (H) 07/17/2021   HDL 90 07/17/2021   LDLCALC 144 (H) 07/17/2021   TRIG 100 07/17/2021   CHOLHDL 2.3 05/26/2019   Lab Results  Component Value Date   TSH 2.190 01/18/2022   Lab Results  Component Value Date   HGBA1C 5.4 01/01/2013   Lab Results  Component Value Date   WBC 5.2 07/17/2020   HGB 13.6 07/17/2020   HCT 40.7 07/17/2020   MCV 92 07/17/2020   PLT 177 07/17/2020   Lab Results  Component Value Date   ALT 15 07/17/2021   AST 25 07/17/2021   ALKPHOS 48 07/17/2021   BILITOT 0.7 07/17/2021   No results found for: "25OHVITD2", "25OHVITD3", "VD25OH"   Review of Systems  Constitutional:  Negative for diaphoresis and unexpected weight change.  HENT:  Negative for congestion, ear pain, rhinorrhea, sore throat and tinnitus.   Eyes:  Positive for itching. Negative for blurred vision, photophobia and visual disturbance.  Respiratory:  Negative for cough, chest tightness, shortness of breath and wheezing.   Cardiovascular:  Negative for chest pain, palpitations, orthopnea, leg swelling and PND.  Gastrointestinal:  Negative for abdominal pain, blood in stool, constipation and diarrhea.  Endocrine: Negative for polydipsia and polyuria.  Musculoskeletal:  Negative for myalgias.  Neurological:  Negative for headaches.  Psychiatric/Behavioral:  Positive for depression.     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   Arthritis 06/06/2014    Aggrieved 06/06/2014   Dermatitis, eczematoid 06/06/2014   Recurrent major depressive episodes (HCC) 06/06/2014   Essential (primary) hypertension 06/06/2014   Gastro-esophageal reflux disease without esophagitis 06/06/2014   Cannot sleep 06/06/2014   Menopause 06/06/2014    No Known Allergies  Past Surgical History:  Procedure Laterality Date   ABDOMINAL HYSTERECTOMY     APPLICATION OF WOUND VAC  11/01/2019   Procedure: APPLICATION OF WOUND VAC;  Surgeon: Carolan Shiver, MD;  Location: [facility];  Service: General;;   BREAST BIOPSY Right 06/25/2017   ductal carcinoma in situ involving a papilloma and encapsulated papillary carcinoma   BREAST BIOPSY Left 10/12/2019   affirm bx of calcs, x marker, path pending   BREAST BIOPSY Left 2006   DCIS   BREAST LUMPECTOMY Right 07/09/2017   DCIS, clear margins   BREAST LUMPECTOMY Left 2006   DCIS   BREAST LUMPECTOMY WITH NEEDLE LOCALIZATION Right 07/09/2017   Procedure: BREAST LUMPECTOMY WITH NEEDLE LOCALIZATION;  Surgeon: Carolan Shiver, MD;  Location: [facility];  Service: General;  Laterality: Right;   BREAST RECONSTRUCTION WITH PLACEMENT OF TISSUE EXPANDER AND FLEX HD (ACELLULAR HYDRATED DERMIS) Bilateral 11/01/2019   Procedure: BREAST RECONSTRUCTION WITH PLACEMENT OF TISSUE EXPANDER AND FLEX HD (ACELLULAR HYDRATED DERMIS);  Surgeon: Allena Napoleon, MD;  Location: [facility];  Service: Plastics;  Laterality: Bilateral;   BREAST SURGERY     left breast lumpectomy   CARDIOVERSION N/A 03/21/2016   Procedure: Cardioversion;  Surgeon: Lamar Blinks, MD;  Location: [facility];  Service: Cardiovascular;  Laterality: N/A;   CATARACT EXTRACTION, BILATERAL     DEBRIDEMENT AND CLOSURE WOUND Bilateral 11/26/2019   Procedure: Excision of bilateral mastectomy flap necrosis with complex closure;  Surgeon: Allena Napoleon, MD;  Location: [facility];  Service: Plastics;  Laterality: Bilateral;  1.5 hours total   LAPAROSCOPIC CHOLECYSTECTOMY      TISSUE EXPANDER PLACEMENT Bilateral 11/26/2019   Procedure: exchange of tissue expanders;  Surgeon: Allena Napoleon, MD;  Location: [facility];  Service: Plastics;  Laterality: Bilateral;   TISSUE EXPANDER PLACEMENT Bilateral 02/01/2020   Procedure: removal of bilateral expander with closure;  Surgeon: Allena Napoleon, MD;  Location: [facility] OR;  Service: Plastics;  Laterality: Bilateral;   TONSILLECTOMY     TOTAL MASTECTOMY Bilateral 11/01/2019   Procedure: TOTAL MASTECTOMY w/ immediate reconstruction and Sentinel Node Biopsy;  Surgeon: Carolan Shiver, MD;  Location: [facility];  Service: General;  Laterality: Bilateral;   VAGINAL HYSTERECTOMY      Social History   Tobacco Use   Smoking status: Never   Smokeless tobacco: Never  Vaping Use   Vaping Use: Never used  Substance Use Topics   Alcohol use: Never   Drug use: No     Medication list has been reviewed and updated.  Current Meds  Medication Sig   acetaminophen (TYLENOL) 500 MG tablet Take by mouth. Take 1,000 mg by mouth every 6 (six) hours Patient takes 500 mg in the am and 1,000 mg in the pm   calcium-vitamin D (OSCAL WITH D) 500-200 MG-UNIT TABS tablet Take 1 tablet by mouth 2 (two) times daily.    celecoxib (CELEBREX) 200  MG capsule TAKE 1 CAPSULE BY MOUTH AT BEDTIME.   diphenhydramine-acetaminophen (TYLENOL PM) 25-500 MG TABS tablet Take 1 tablet by mouth at bedtime.   flecainide (TAMBOCOR) 100 MG tablet Take 50 mg by mouth 2 (two) times daily.   furosemide (LASIX) 20 MG tablet Take 1 tablet by mouth daily.   hydrochlorothiazide (MICROZIDE) 12.5 MG capsule TAKE 1 CAPSULE BY MOUTH EVERY DAY   levothyroxine (SYNTHROID) 25 MCG tablet Take 1 tablet (25 mcg total) by mouth daily.   pravastatin (PRAVACHOL) 20 MG tablet Take 1 tablet (20 mg total) by mouth daily.   sertraline (ZOLOFT) 100 MG tablet Take 1 tablet (100 mg total) by mouth daily.       07/19/2022   10:50 AM 01/18/2022   10:45 AM 10/29/2021   11:14 AM 07/17/2021    10:54 AM  GAD 7 : Generalized Anxiety Score  Nervous, Anxious, on Edge 0 0 0 1  Control/stop worrying 0 0 0 0  Worry too much - different things 0 0 0 0  Trouble relaxing 0 0 0 0  Restless 0 0 0 2  Easily annoyed or irritable 0 0 0 2  Afraid - awful might happen 0 0 0 0  Total GAD 7 Score 0 0 0 5  Anxiety Difficulty Not difficult at all Not difficult at all Not difficult at all Somewhat difficult       07/19/2022   10:50 AM 01/18/2022   10:45 AM 12/28/2021   11:28 AM  Depression screen PHQ 2/9  Decreased Interest 0 0 0  Down, Depressed, Hopeless 0 2 0  PHQ - 2 Score 0 2 0  Altered sleeping 0 0   Tired, decreased energy 0 0   Change in appetite 0 0   Feeling bad or failure about yourself  0 0   Trouble concentrating 0 0   Moving slowly or fidgety/restless 0 0   Suicidal thoughts 0 0   PHQ-9 Score 0 2   Difficult doing work/chores Not difficult at all Somewhat difficult     BP Readings from Last 3 Encounters:  07/19/22 122/64  01/18/22 120/68  10/29/21 124/76    Physical Exam Vitals and nursing note reviewed. Exam conducted with a chaperone present.  Constitutional:      General: She is not in acute distress.    Appearance: She is not diaphoretic.  HENT:     Head: Normocephalic and atraumatic.     Right Ear: Tympanic membrane and external ear normal.     Left Ear: Tympanic membrane and external ear normal.     Nose: Nose normal.     Mouth/Throat:     Mouth: Mucous membranes are moist.  Eyes:     General: Lids are everted, no foreign bodies appreciated.        Right eye: Hordeolum present. No discharge.        Left eye: No discharge.     Conjunctiva/sclera: Conjunctivae normal.     Pupils: Pupils are equal, round, and reactive to light.  Neck:     Thyroid: No thyromegaly.     Vascular: No JVD.  Cardiovascular:     Rate and Rhythm: Normal rate and regular rhythm.     Heart sounds: Normal heart sounds. No murmur heard.    No friction rub. No gallop.   Pulmonary:     Effort: Pulmonary effort is normal.     Breath sounds: Normal breath sounds.  Abdominal:     General: Bowel sounds are  normal.     Palpations: Abdomen is soft. There is no mass.     Tenderness: There is no abdominal tenderness. There is no guarding.  Musculoskeletal:        General: Normal range of motion.     Cervical back: Normal range of motion and neck supple.  Lymphadenopathy:     Cervical: No cervical adenopathy.  Skin:    General: Skin is warm and dry.  Neurological:     Mental Status: She is alert.     Deep Tendon Reflexes: Reflexes are normal and symmetric.     Wt Readings from Last 3 Encounters:  07/19/22 234 lb (106.1 kg)  01/18/22 231 lb (104.8 kg)  10/29/21 226 lb (102.5 kg)    BP 122/64   Pulse (!) 54   Ht 5\' 5"  (1.651 m)   Wt 234 lb (106.1 kg)   SpO2 98%   BMI 38.94 kg/m   Assessment and Plan:  1. Essential hypertension Chronic.  Controlled.  Stable.  Blood pressure 122/64.  Asymptomatic.  Tolerating medication well.  Continue hydrochlorothiazide that I prescribed 12.5 mg once a day.  Will check renal function panel for electrolytes and GFR.  Will recheck in 6 months. - hydrochlorothiazide (MICROZIDE) 12.5 MG capsule; TAKE 1 CAPSULE BY MOUTH EVERY DAY  Dispense: 90 capsule; Refill: 1 - Renal Function Panel  2. Dilated cardiomyopathy (HCC) As noted above. - hydrochlorothiazide (MICROZIDE) 12.5 MG capsule; TAKE 1 CAPSULE BY MOUTH EVERY DAY  Dispense: 90 capsule; Refill: 1  3. Hypothyroidism, unspecified type Chronic.  Controlled.  Stable.  Asymptomatic.  Currently on levothyroxine 25 mcg daily.  Will check TSH for current status of control. - levothyroxine (SYNTHROID) 25 MCG tablet; Take 1 tablet (25 mcg total) by mouth daily.  Dispense: 90 tablet; Refill: 1 - TSH  4. Familial multiple lipoprotein-type hyperlipidemia Chronic.  Controlled.  Stable.  Continue pravastatin 20 mg once a day. - pravastatin (PRAVACHOL) 20 MG tablet; Take 1  tablet (20 mg total) by mouth daily.  Dispense: 90 tablet; Refill: 1  5. Dyslipidemia As noted above - pravastatin (PRAVACHOL) 20 MG tablet; Take 1 tablet (20 mg total) by mouth daily.  Dispense: 90 tablet; Refill: 1  6. Recurrent major depressive episodes (HCC) .  Controlled.  Stable.  PHQ is 0 GAD score is 0 continue sertraline 100 mg daily. - sertraline (ZOLOFT) 100 MG tablet; Take 1 tablet (100 mg total) by mouth daily.  Dispense: 90 tablet; Refill: 1  7. Hordeolum externum of right upper eyelid Patient had some cosmetic procedures involving eyelashes afterwards developed a knot that is more to the external and internal.  I do not know if this is scar tissue or an early hordeolum we will refer to ophthalmology for evaluation and that patient wants me to remove it and I do not have the skill or the equipment for so. - Ambulatory referral to Ophthalmology    Elizabeth Sauer, MD

## 2022-07-20 LAB — RENAL FUNCTION PANEL
Albumin: 4.1 g/dL (ref 3.8–4.8)
BUN/Creatinine Ratio: 17 (ref 12–28)
BUN: 24 mg/dL (ref 8–27)
CO2: 26 mmol/L (ref 20–29)
Calcium: 9.9 mg/dL (ref 8.7–10.3)
Chloride: 107 mmol/L — ABNORMAL HIGH (ref 96–106)
Creatinine, Ser: 1.39 mg/dL — ABNORMAL HIGH (ref 0.57–1.00)
Glucose: 96 mg/dL (ref 70–99)
Phosphorus: 3.2 mg/dL (ref 3.0–4.3)
Potassium: 5.5 mmol/L — ABNORMAL HIGH (ref 3.5–5.2)
Sodium: 147 mmol/L — ABNORMAL HIGH (ref 134–144)
eGFR: 38 mL/min/{1.73_m2} — ABNORMAL LOW (ref >59)

## 2022-07-20 LAB — TSH: TSH: 1.71 u[IU]/mL (ref 0.450–4.500)

## 2022-07-21 ENCOUNTER — Other Ambulatory Visit: Payer: Self-pay | Admitting: Family Medicine

## 2022-07-21 DIAGNOSIS — M542 Cervicalgia: Secondary | ICD-10-CM

## 2022-07-21 DIAGNOSIS — M47816 Spondylosis without myelopathy or radiculopathy, lumbar region: Secondary | ICD-10-CM

## 2022-07-21 DIAGNOSIS — M5136 Other intervertebral disc degeneration, lumbar region: Secondary | ICD-10-CM

## 2022-07-28 ENCOUNTER — Other Ambulatory Visit: Payer: Self-pay | Admitting: Family Medicine

## 2022-07-28 DIAGNOSIS — M542 Cervicalgia: Secondary | ICD-10-CM

## 2022-07-28 DIAGNOSIS — M5136 Other intervertebral disc degeneration, lumbar region: Secondary | ICD-10-CM

## 2022-07-28 DIAGNOSIS — M47816 Spondylosis without myelopathy or radiculopathy, lumbar region: Secondary | ICD-10-CM

## 2022-08-22 DIAGNOSIS — E782 Mixed hyperlipidemia: Secondary | ICD-10-CM | POA: Diagnosis not present

## 2022-08-22 DIAGNOSIS — I480 Paroxysmal atrial fibrillation: Secondary | ICD-10-CM | POA: Diagnosis not present

## 2022-08-22 DIAGNOSIS — I10 Essential (primary) hypertension: Secondary | ICD-10-CM | POA: Diagnosis not present

## 2022-08-27 DIAGNOSIS — M1990 Unspecified osteoarthritis, unspecified site: Secondary | ICD-10-CM | POA: Diagnosis not present

## 2022-08-27 DIAGNOSIS — Z008 Encounter for other general examination: Secondary | ICD-10-CM | POA: Diagnosis not present

## 2022-08-27 DIAGNOSIS — D6869 Other thrombophilia: Secondary | ICD-10-CM | POA: Diagnosis not present

## 2022-08-27 DIAGNOSIS — N1832 Chronic kidney disease, stage 3b: Secondary | ICD-10-CM | POA: Diagnosis not present

## 2022-08-27 DIAGNOSIS — Z8249 Family history of ischemic heart disease and other diseases of the circulatory system: Secondary | ICD-10-CM | POA: Diagnosis not present

## 2022-08-27 DIAGNOSIS — G3184 Mild cognitive impairment, so stated: Secondary | ICD-10-CM | POA: Diagnosis not present

## 2022-08-27 DIAGNOSIS — E785 Hyperlipidemia, unspecified: Secondary | ICD-10-CM | POA: Diagnosis not present

## 2022-08-27 DIAGNOSIS — E039 Hypothyroidism, unspecified: Secondary | ICD-10-CM | POA: Diagnosis not present

## 2022-08-27 DIAGNOSIS — I429 Cardiomyopathy, unspecified: Secondary | ICD-10-CM | POA: Diagnosis not present

## 2022-08-27 DIAGNOSIS — I129 Hypertensive chronic kidney disease with stage 1 through stage 4 chronic kidney disease, or unspecified chronic kidney disease: Secondary | ICD-10-CM | POA: Diagnosis not present

## 2022-08-27 DIAGNOSIS — F419 Anxiety disorder, unspecified: Secondary | ICD-10-CM | POA: Diagnosis not present

## 2022-08-27 DIAGNOSIS — I4891 Unspecified atrial fibrillation: Secondary | ICD-10-CM | POA: Diagnosis not present

## 2022-08-27 DIAGNOSIS — H538 Other visual disturbances: Secondary | ICD-10-CM | POA: Diagnosis not present

## 2022-09-05 DIAGNOSIS — F0280 Dementia in other diseases classified elsewhere without behavioral disturbance: Secondary | ICD-10-CM | POA: Diagnosis not present

## 2022-09-05 DIAGNOSIS — F0150 Vascular dementia without behavioral disturbance: Secondary | ICD-10-CM | POA: Diagnosis not present

## 2022-09-05 DIAGNOSIS — H02402 Unspecified ptosis of left eyelid: Secondary | ICD-10-CM | POA: Diagnosis not present

## 2022-09-05 DIAGNOSIS — F5101 Primary insomnia: Secondary | ICD-10-CM | POA: Diagnosis not present

## 2022-09-05 DIAGNOSIS — G309 Alzheimer's disease, unspecified: Secondary | ICD-10-CM | POA: Diagnosis not present

## 2022-09-06 ENCOUNTER — Other Ambulatory Visit: Payer: Self-pay | Admitting: Student

## 2022-09-06 DIAGNOSIS — H02402 Unspecified ptosis of left eyelid: Secondary | ICD-10-CM

## 2022-10-17 DIAGNOSIS — G309 Alzheimer's disease, unspecified: Secondary | ICD-10-CM | POA: Diagnosis not present

## 2022-10-17 DIAGNOSIS — F5101 Primary insomnia: Secondary | ICD-10-CM | POA: Diagnosis not present

## 2022-10-17 DIAGNOSIS — F0150 Vascular dementia without behavioral disturbance: Secondary | ICD-10-CM | POA: Diagnosis not present

## 2022-10-17 DIAGNOSIS — F0280 Dementia in other diseases classified elsewhere without behavioral disturbance: Secondary | ICD-10-CM | POA: Diagnosis not present

## 2022-10-17 DIAGNOSIS — H02402 Unspecified ptosis of left eyelid: Secondary | ICD-10-CM | POA: Diagnosis not present

## 2022-11-04 DIAGNOSIS — I480 Paroxysmal atrial fibrillation: Secondary | ICD-10-CM | POA: Diagnosis not present

## 2022-11-11 ENCOUNTER — Other Ambulatory Visit: Payer: Self-pay | Admitting: Family Medicine

## 2022-11-11 DIAGNOSIS — E785 Hyperlipidemia, unspecified: Secondary | ICD-10-CM

## 2022-11-11 DIAGNOSIS — E7849 Other hyperlipidemia: Secondary | ICD-10-CM

## 2022-11-15 ENCOUNTER — Encounter: Payer: Self-pay | Admitting: Family Medicine

## 2022-11-15 ENCOUNTER — Ambulatory Visit (INDEPENDENT_AMBULATORY_CARE_PROVIDER_SITE_OTHER): Payer: Medicare HMO | Admitting: Family Medicine

## 2022-11-15 VITALS — BP 118/78 | HR 82 | Ht 65.0 in | Wt 234.0 lb

## 2022-11-15 DIAGNOSIS — I10 Essential (primary) hypertension: Secondary | ICD-10-CM

## 2022-11-15 DIAGNOSIS — E785 Hyperlipidemia, unspecified: Secondary | ICD-10-CM

## 2022-11-15 DIAGNOSIS — F339 Major depressive disorder, recurrent, unspecified: Secondary | ICD-10-CM | POA: Diagnosis not present

## 2022-11-15 DIAGNOSIS — E039 Hypothyroidism, unspecified: Secondary | ICD-10-CM

## 2022-11-15 DIAGNOSIS — E7849 Other hyperlipidemia: Secondary | ICD-10-CM

## 2022-11-15 MED ORDER — LEVOTHYROXINE SODIUM 25 MCG PO TABS
25.0000 ug | ORAL_TABLET | Freq: Every day | ORAL | 1 refills | Status: AC
Start: 2022-11-15 — End: ?

## 2022-11-15 MED ORDER — HYDROCHLOROTHIAZIDE 12.5 MG PO CAPS
ORAL_CAPSULE | ORAL | 1 refills | Status: DC
Start: 2022-11-15 — End: 2023-04-04

## 2022-11-15 MED ORDER — PRAVASTATIN SODIUM 20 MG PO TABS
20.0000 mg | ORAL_TABLET | Freq: Every day | ORAL | 1 refills | Status: AC
Start: 2022-11-15 — End: ?

## 2022-11-15 MED ORDER — SERTRALINE HCL 100 MG PO TABS
100.0000 mg | ORAL_TABLET | Freq: Every day | ORAL | 1 refills | Status: DC
Start: 2022-11-15 — End: 2023-05-26

## 2022-11-15 NOTE — Progress Notes (Signed)
Date:  11/15/2022   Name:  Melissa Chase   DOB:  [birth year]   MRN:  [id number]   Chief Complaint: Hypertension, Depression, Hyperlipidemia, and Hypothyroidism  Hypertension This is a chronic problem. The current episode started more than 1 year ago. The problem has been waxing and waning since onset. The problem is controlled. Pertinent negatives include no anxiety, blurred vision, chest pain, headaches, malaise/fatigue, neck pain, orthopnea, palpitations, peripheral edema, PND, shortness of breath or sweats. There are no associated agents to hypertension. Risk factors for coronary artery disease include dyslipidemia. Past treatments include diuretics. There is no history of angina, kidney disease, CAD/MI, CVA, heart failure, left ventricular hypertrophy, PVD or retinopathy. There is no history of chronic renal disease, a hypertension causing med or renovascular disease.  Depression        Associated symptoms include no headaches.   Pertinent negatives include no anxiety. Hyperlipidemia She has no history of chronic renal disease. Pertinent negatives include no chest pain or shortness of breath.    Lab Results  Component Value Date   NA 147 (H) 07/19/2022   K 5.5 (H) 07/19/2022   CO2 26 07/19/2022   GLUCOSE 96 07/19/2022   BUN 24 07/19/2022   CREATININE 1.39 (H) 07/19/2022   CALCIUM 9.9 07/19/2022   EGFR 38 (L) 07/19/2022   GFRNONAA 64 03/03/2020   Lab Results  Component Value Date   CHOL 251 (H) 07/17/2021   HDL 90 07/17/2021   LDLCALC 144 (H) 07/17/2021   TRIG 100 07/17/2021   CHOLHDL 2.3 05/26/2019   Lab Results  Component Value Date   TSH 1.710 07/19/2022   Lab Results  Component Value Date   HGBA1C 5.4 01/01/2013   Lab Results  Component Value Date   WBC 5.2 07/17/2020   HGB 13.6 07/17/2020   HCT 40.7 07/17/2020   MCV 92 07/17/2020   PLT 177 07/17/2020   Lab Results  Component Value Date   ALT 15 07/17/2021   AST 25 07/17/2021   ALKPHOS 48  07/17/2021   BILITOT 0.7 07/17/2021   No results found for: "25OHVITD2", "25OHVITD3", "VD25OH"   Review of Systems  Constitutional:  Negative for diaphoresis and malaise/fatigue.  HENT:  Negative for congestion.   Eyes:  Negative for blurred vision, photophobia, pain, redness, itching and visual disturbance.  Respiratory:  Negative for apnea, cough, choking, chest tightness, shortness of breath, wheezing and stridor.   Cardiovascular:  Negative for chest pain, palpitations, orthopnea, leg swelling and PND.  Gastrointestinal:  Negative for abdominal distention, abdominal pain, anal bleeding and blood in stool.  Musculoskeletal:  Negative for neck pain.  Neurological:  Negative for headaches.  Psychiatric/Behavioral:  Positive for depression.     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   Arthritis 06/06/2014   Aggrieved 06/06/2014   Dermatitis, eczematoid 06/06/2014   Recurrent major depressive episodes (HCC) 06/06/2014   Essential (primary) hypertension 06/06/2014   Gastro-esophageal reflux disease without esophagitis 06/06/2014   Cannot sleep 06/06/2014   Menopause 06/06/2014  No Known Allergies  Past Surgical History:  Procedure Laterality Date   ABDOMINAL HYSTERECTOMY     APPLICATION OF WOUND VAC  11/01/2019   Procedure: APPLICATION OF WOUND VAC;  Surgeon: Carolan Shiver, MD;  Location: [facility];  Service: General;;   BREAST BIOPSY Right 06/25/2017    ductal carcinoma in situ involving a papilloma and encapsulated papillary carcinoma   BREAST BIOPSY Left 10/12/2019   affirm bx of calcs, x marker, path pending   BREAST BIOPSY Left 2006   DCIS   BREAST LUMPECTOMY Right 07/09/2017   DCIS, clear margins   BREAST LUMPECTOMY Left 2006   DCIS   BREAST LUMPECTOMY WITH NEEDLE LOCALIZATION Right 07/09/2017   Procedure: BREAST LUMPECTOMY WITH NEEDLE LOCALIZATION;  Surgeon: Carolan Shiver, MD;  Location: [facility];  Service: General;  Laterality: Right;   BREAST RECONSTRUCTION WITH PLACEMENT OF TISSUE EXPANDER AND FLEX HD (ACELLULAR HYDRATED DERMIS) Bilateral 11/01/2019   Procedure: BREAST RECONSTRUCTION WITH PLACEMENT OF TISSUE EXPANDER AND FLEX HD (ACELLULAR HYDRATED DERMIS);  Surgeon: Allena Napoleon, MD;  Location: [facility];  Service: Plastics;  Laterality: Bilateral;   BREAST SURGERY     left breast lumpectomy   CARDIOVERSION N/A 03/21/2016   Procedure: Cardioversion;  Surgeon: Lamar Blinks, MD;  Location: [facility];  Service: Cardiovascular;  Laterality: N/A;   CATARACT EXTRACTION, BILATERAL     DEBRIDEMENT AND CLOSURE WOUND Bilateral 11/26/2019   Procedure: Excision of bilateral mastectomy flap necrosis with complex closure;  Surgeon: Allena Napoleon, MD;  Location: [facility] OR;  Service: Plastics;  Laterality: Bilateral;  1.5 hours total   LAPAROSCOPIC CHOLECYSTECTOMY     TISSUE EXPANDER PLACEMENT Bilateral 11/26/2019   Procedure: exchange of tissue expanders;  Surgeon: Allena Napoleon, MD;  Location: [facility] OR;  Service: Plastics;  Laterality: Bilateral;   TISSUE EXPANDER PLACEMENT Bilateral 02/01/2020   Procedure: removal of bilateral expander with closure;  Surgeon: Allena Napoleon, MD;  Location: [facility] OR;  Service: Plastics;  Laterality: Bilateral;   TONSILLECTOMY     TOTAL MASTECTOMY Bilateral 11/01/2019   Procedure: TOTAL MASTECTOMY w/ immediate reconstruction and Sentinel Node Biopsy;  Surgeon: Carolan Shiver, MD;  Location: [facility]  ORS;  Service: General;  Laterality: Bilateral;   VAGINAL HYSTERECTOMY      Social History   Tobacco Use   Smoking status: Never   Smokeless tobacco: Never  Vaping Use   Vaping status: Never Used  Substance Use Topics   Alcohol use: Never   Drug use: No     Medication list has been reviewed and updated.  Current Meds  Medication Sig   acetaminophen (TYLENOL) 500 MG tablet Take by mouth. Take 1,000 mg by mouth every 6 (six) hours Patient takes 500 mg in the am and 1,000 mg in the pm   apixaban (ELIQUIS) 5 MG TABS tablet Take 5 mg by mouth 2 (two) times daily.   calcium-vitamin D (OSCAL WITH D) 500-200 MG-UNIT TABS tablet Take 1 tablet by mouth 2 (two) times daily.    celecoxib (CELEBREX) 200 MG capsule TAKE 1 CAPSULE BY MOUTH AT BEDTIME.   diphenhydramine-acetaminophen (TYLENOL PM) 25-500 MG TABS tablet Take 1 tablet by mouth at bedtime.   flecainide (TAMBOCOR) 100 MG tablet Take 50 mg by mouth 2 (two) times daily.   hydrochlorothiazide (MICROZIDE) 12.5 MG capsule TAKE 1 CAPSULE BY MOUTH EVERY DAY   levothyroxine (SYNTHROID) 25 MCG tablet Take 1 tablet (25 mcg total) by mouth daily.   memantine (NAMENDA) 5 MG tablet Take 5  mg by mouth 2 (two) times daily.   pravastatin (PRAVACHOL) 20 MG tablet TAKE 1 TABLET BY MOUTH EVERY DAY   sertraline (ZOLOFT) 100 MG tablet Take 1 tablet (100 mg total) by mouth daily.       07/19/2022   10:50 AM 01/18/2022   10:45 AM 10/29/2021   11:14 AM 07/17/2021   10:54 AM  GAD 7 : Generalized Anxiety Score  Nervous, Anxious, on Edge 0 0 0 1  Control/stop worrying 0 0 0 0  Worry too much - different things 0 0 0 0  Trouble relaxing 0 0 0 0  Restless 0 0 0 2  Easily annoyed or irritable 0 0 0 2  Afraid - awful might happen 0 0 0 0  Total GAD 7 Score 0 0 0 5  Anxiety Difficulty Not difficult at all Not difficult at all Not difficult at all Somewhat difficult       07/19/2022   10:50 AM 01/18/2022   10:45 AM 12/28/2021   11:28 AM  Depression  screen PHQ 2/9  Decreased Interest 0 0 0  Down, Depressed, Hopeless 0 2 0  PHQ - 2 Score 0 2 0  Altered sleeping 0 0   Tired, decreased energy 0 0   Change in appetite 0 0   Feeling bad or failure about yourself  0 0   Trouble concentrating 0 0   Moving slowly or fidgety/restless 0 0   Suicidal thoughts 0 0   PHQ-9 Score 0 2   Difficult doing work/chores Not difficult at all Somewhat difficult     BP Readings from Last 3 Encounters:  11/15/22 (!) 142/78  07/19/22 122/64  01/18/22 120/68    Physical Exam Vitals and nursing note reviewed. Exam conducted with a chaperone present.  Constitutional:      General: She is not in acute distress.    Appearance: She is not diaphoretic.  HENT:     Head: Normocephalic and atraumatic.     Right Ear: Tympanic membrane and external ear normal.     Left Ear: Tympanic membrane and external ear normal.     Nose: Nose normal.  Eyes:     General:        Right eye: No discharge.        Left eye: No discharge.     Conjunctiva/sclera: Conjunctivae normal.     Pupils: Pupils are equal, round, and reactive to light.  Neck:     Thyroid: No thyromegaly.     Vascular: No JVD.  Cardiovascular:     Rate and Rhythm: Normal rate and regular rhythm.     Heart sounds: Normal heart sounds. No murmur heard.    No friction rub. No gallop.  Pulmonary:     Effort: Pulmonary effort is normal.     Breath sounds: Normal breath sounds.  Abdominal:     General: Bowel sounds are normal.     Palpations: Abdomen is soft. There is no mass.     Tenderness: There is no abdominal tenderness. There is no guarding.  Musculoskeletal:        General: Normal range of motion.     Cervical back: Normal range of motion and neck supple.  Lymphadenopathy:     Cervical: No cervical adenopathy.  Skin:    General: Skin is warm and dry.  Neurological:     Mental Status: She is alert.     Deep Tendon Reflexes: Reflexes are normal and symmetric.  Wt Readings from  Last 3 Encounters:  11/15/22 234 lb (106.1 kg)  07/19/22 234 lb (106.1 kg)  01/18/22 231 lb (104.8 kg)    BP (!) 142/78   Pulse 82   Ht 5\' 5"  (1.651 m)   Wt 234 lb (106.1 kg)   SpO2 94%   BMI 38.94 kg/m   Assessment and Plan:  1. Essential hypertension Chronic.  Controlled.  Stable.  Blood pressure 118/78.  Asymptomatic.  Tolerating medication well.  Continue hydrochlorothiazide 12.5 mg once a day and renal function panel will be done to assess electrolytes and GFR. - hydrochlorothiazide (MICROZIDE) 12.5 MG capsule; TAKE 1 CAPSULE BY MOUTH EVERY DAY  Dispense: 90 capsule; Refill: 1 - Renal Function Panel  2. Hypothyroidism, unspecified type Chronic.  Controlled.  Stable.  Will likely continue levothyroxine 25 mcg daily pending TSH level. - levothyroxine (SYNTHROID) 25 MCG tablet; Take 1 tablet (25 mcg total) by mouth daily.  Dispense: 90 tablet; Refill: 1 - TSH  3. Familial multiple lipoprotein-type hyperlipidemia Chronic.  Controlled.  Stable.  Continue pravastatin 20 mg once a day.  Will recheck lipid panel in 6 months. - pravastatin (PRAVACHOL) 20 MG tablet; Take 1 tablet (20 mg total) by mouth daily.  Dispense: 90 tablet; Refill: 1 - Lipid Panel With LDL/HDL Ratio  4. Dyslipidemia As noted above - pravastatin (PRAVACHOL) 20 MG tablet; Take 1 tablet (20 mg total) by mouth daily.  Dispense: 90 tablet; Refill: 1 - Lipid Panel With LDL/HDL Ratio  5. Recurrent major depressive episodes (HCC) Chronic.  Controlled.  Stable.  PHQ was 0 GAD score 0 continue sertraline 100 mg once a day.  Will recheck patient in 6 months. - sertraline (ZOLOFT) 100 MG tablet; Take 1 tablet (100 mg total) by mouth daily.  Dispense: 90 tablet; Refill: 1    Elizabeth Sauer, MD

## 2022-11-16 ENCOUNTER — Encounter: Payer: Self-pay | Admitting: Family Medicine

## 2022-11-16 LAB — RENAL FUNCTION PANEL
Albumin: 4.7 g/dL (ref 3.8–4.8)
BUN/Creatinine Ratio: 14 (ref 12–28)
BUN: 20 mg/dL (ref 8–27)
CO2: 25 mmol/L (ref 20–29)
Calcium: 9.9 mg/dL (ref 8.7–10.3)
Chloride: 105 mmol/L (ref 96–106)
Creatinine, Ser: 1.39 mg/dL — ABNORMAL HIGH (ref 0.57–1.00)
Glucose: 93 mg/dL (ref 70–99)
Phosphorus: 3.2 mg/dL (ref 3.0–4.3)
Potassium: 5 mmol/L (ref 3.5–5.2)
Sodium: 143 mmol/L (ref 134–144)
eGFR: 38 mL/min/{1.73_m2} — ABNORMAL LOW (ref >59)

## 2022-11-16 LAB — LIPID PANEL WITH LDL/HDL RATIO
Cholesterol, Total: 223 mg/dL — ABNORMAL HIGH (ref 100–199)
HDL: 100 mg/dL (ref >39)
LDL Chol Calc (NIH): 106 mg/dL — ABNORMAL HIGH (ref 0–99)
LDL/HDL Ratio: 1.1 ratio (ref 0.0–3.2)
Triglycerides: 101 mg/dL (ref 0–149)
VLDL Cholesterol Cal: 17 mg/dL (ref 5–40)

## 2022-11-16 LAB — TSH: TSH: 1.3 u[IU]/mL (ref 0.450–4.500)

## 2022-12-02 DIAGNOSIS — E782 Mixed hyperlipidemia: Secondary | ICD-10-CM | POA: Diagnosis not present

## 2022-12-02 DIAGNOSIS — I10 Essential (primary) hypertension: Secondary | ICD-10-CM | POA: Diagnosis not present

## 2022-12-02 DIAGNOSIS — I480 Paroxysmal atrial fibrillation: Secondary | ICD-10-CM | POA: Diagnosis not present

## 2022-12-10 DIAGNOSIS — G7000 Myasthenia gravis without (acute) exacerbation: Secondary | ICD-10-CM | POA: Diagnosis not present

## 2022-12-10 DIAGNOSIS — F0150 Vascular dementia without behavioral disturbance: Secondary | ICD-10-CM | POA: Diagnosis not present

## 2022-12-10 DIAGNOSIS — G309 Alzheimer's disease, unspecified: Secondary | ICD-10-CM | POA: Diagnosis not present

## 2022-12-10 DIAGNOSIS — F0280 Dementia in other diseases classified elsewhere without behavioral disturbance: Secondary | ICD-10-CM | POA: Diagnosis not present

## 2023-01-20 ENCOUNTER — Ambulatory Visit: Payer: Medicare HMO | Admitting: Family Medicine

## 2023-02-03 ENCOUNTER — Other Ambulatory Visit
Admission: RE | Admit: 2023-02-03 | Discharge: 2023-02-03 | Disposition: A | Discharge status: Home / Self Care | Payer: Medicare HMO | Source: Home / Self Care | Attending: Family Medicine | Admitting: Family Medicine

## 2023-02-03 ENCOUNTER — Encounter: Payer: Self-pay | Admitting: Family Medicine

## 2023-02-03 ENCOUNTER — Ambulatory Visit
Admission: RE | Admit: 2023-02-03 | Discharge: 2023-02-03 | Disposition: A | Discharge status: Home / Self Care | Payer: Medicare HMO | Attending: Family Medicine | Admitting: Family Medicine

## 2023-02-03 ENCOUNTER — Ambulatory Visit: Payer: Medicare HMO | Admitting: Family Medicine

## 2023-02-03 ENCOUNTER — Ambulatory Visit
Admission: RE | Admit: 2023-02-03 | Discharge: 2023-02-03 | Disposition: A | Discharge status: Home / Self Care | Payer: Medicare HMO | Source: Ambulatory Visit | Attending: Family Medicine | Admitting: Family Medicine

## 2023-02-03 ENCOUNTER — Other Ambulatory Visit: Payer: Self-pay | Admitting: Family Medicine

## 2023-02-03 VITALS — BP 124/77 | HR 69 | Temp 98.2°F | Ht 65.0 in | Wt 234.0 lb

## 2023-02-03 DIAGNOSIS — I771 Stricture of artery: Secondary | ICD-10-CM | POA: Diagnosis not present

## 2023-02-03 DIAGNOSIS — R5383 Other fatigue: Secondary | ICD-10-CM

## 2023-02-03 DIAGNOSIS — R6889 Other general symptoms and signs: Secondary | ICD-10-CM

## 2023-02-03 DIAGNOSIS — U099 Post covid-19 condition, unspecified: Secondary | ICD-10-CM | POA: Diagnosis not present

## 2023-02-03 DIAGNOSIS — R2990 Unspecified symptoms and signs involving the nervous system: Secondary | ICD-10-CM | POA: Insufficient documentation

## 2023-02-03 DIAGNOSIS — R059 Cough, unspecified: Secondary | ICD-10-CM | POA: Diagnosis not present

## 2023-02-03 DIAGNOSIS — R0600 Dyspnea, unspecified: Secondary | ICD-10-CM | POA: Diagnosis not present

## 2023-02-03 DIAGNOSIS — R058 Other specified cough: Secondary | ICD-10-CM

## 2023-02-03 LAB — CBC WITH DIFFERENTIAL/PLATELET
Abs Immature Granulocytes: 0.04 10*3/uL (ref 0.00–0.07)
Basophils Absolute: 0 10*3/uL (ref 0.0–0.1)
Basophils Relative: 0 %
Eosinophils Absolute: 0 10*3/uL (ref 0.0–0.5)
Eosinophils Relative: 0 %
HCT: 41.8 % (ref 36.0–46.0)
Hemoglobin: 14.4 g/dL (ref 12.0–15.0)
Immature Granulocytes: 1 %
Lymphocytes Relative: 9 %
Lymphs Abs: 0.8 10*3/uL (ref 0.7–4.0)
MCH: 32.4 pg (ref 26.0–34.0)
MCHC: 34.4 g/dL (ref 30.0–36.0)
MCV: 94.1 fL (ref 80.0–100.0)
Monocytes Absolute: 1.1 10*3/uL — ABNORMAL HIGH (ref 0.1–1.0)
Monocytes Relative: 13 %
Neutro Abs: 6.4 10*3/uL (ref 1.7–7.7)
Neutrophils Relative %: 77 %
Platelets: 146 10*3/uL — ABNORMAL LOW (ref 150–400)
RBC: 4.44 MIL/uL (ref 3.87–5.11)
RDW: 13.1 % (ref 11.5–15.5)
WBC: 8.4 10*3/uL (ref 4.0–10.5)
nRBC: 0 % (ref 0.0–0.2)

## 2023-02-03 LAB — RENAL FUNCTION PANEL
Albumin: 4.2 g/dL (ref 3.5–5.0)
Anion gap: 8 (ref 5–15)
BUN: 20 mg/dL (ref 8–23)
CO2: 25 mmol/L (ref 22–32)
Calcium: 9.3 mg/dL (ref 8.9–10.3)
Chloride: 104 mmol/L (ref 98–111)
Creatinine, Ser: 1.26 mg/dL — ABNORMAL HIGH (ref 0.44–1.00)
GFR, Estimated: 43 mL/min — ABNORMAL LOW (ref >60)
Glucose, Bld: 98 mg/dL (ref 70–99)
Phosphorus: 3.1 mg/dL (ref 2.5–4.6)
Potassium: 4 mmol/L (ref 3.5–5.1)
Sodium: 137 mmol/L (ref 135–145)

## 2023-02-03 LAB — POCT INFLUENZA A/B
Influenza A, POC: NEGATIVE
Influenza B, POC: NEGATIVE

## 2023-02-03 LAB — POC COVID19 BINAXNOW: SARS Coronavirus 2 Ag: POSITIVE — AB

## 2023-02-03 MED ORDER — MOLNUPIRAVIR EUA 200MG CAPSULE: 4.0000 | ORAL_CAPSULE | Freq: Two times a day (BID) | ORAL | 0 refills | Status: AC

## 2023-02-03 NOTE — Progress Notes (Signed)
 Date:  02/03/2023   Name:  Melissa Chase   DOB:  08-Mar-1940   MRN:  [id number]   Chief Complaint: Cough, Nasal Congestion (Pt is having runny nose and chest congestion and weakness. Pt states symptoms started on Saturday 02/01/2023.), and Headache  Cough This is a new problem. The current episode started in the past 7 days (2 days). The problem occurs every few minutes. The cough is Non-productive. Associated symptoms include myalgias, nasal congestion, rhinorrhea, a sore throat and shortness of breath. Pertinent negatives include no chest pain, fever, headaches, hemoptysis, postnasal drip or wheezing. She has tried nothing for the symptoms.  Shortness of Breath This is a new problem. The current episode started in the past 7 days (2day). The problem occurs intermittently. The problem has been unchanged. Associated symptoms include rhinorrhea and a sore throat. Pertinent negatives include no chest pain, fever, headaches, hemoptysis or wheezing. The treatment provided mild relief. There is no history of allergies.    Lab Results  Component Value Date   NA 143 11/15/2022   K 5.0 11/15/2022   CO2 25 11/15/2022   GLUCOSE 93 11/15/2022   BUN 20 11/15/2022   CREATININE 1.39 (H) 11/15/2022   CALCIUM  9.9 11/15/2022   EGFR 38 (L) 11/15/2022   GFRNONAA 64 03/03/2020   Lab Results  Component Value Date   CHOL 223 (H) 11/15/2022   HDL 100 11/15/2022   LDLCALC 106 (H) 11/15/2022   TRIG 101 11/15/2022   CHOLHDL 2.3 05/26/2019   Lab Results  Component Value Date   TSH 1.300 11/15/2022   Lab Results  Component Value Date   HGBA1C 5.4 01/01/2013   Lab Results  Component Value Date   WBC 5.2 07/17/2020   HGB 13.6 07/17/2020   HCT 40.7 07/17/2020   MCV 92 07/17/2020   PLT 177 07/17/2020   Lab Results  Component Value Date   ALT 15 07/17/2021   AST 25 07/17/2021   ALKPHOS 48 07/17/2021   BILITOT 0.7 07/17/2021   No results found for: 25OHVITD2, 25OHVITD3, VD25OH    Review of Systems  Constitutional:  Negative for fever.  HENT:  Positive for congestion, rhinorrhea and sore throat. Negative for postnasal drip, sinus pressure, sinus pain, sneezing and trouble swallowing.   Respiratory:  Positive for cough and shortness of breath. Negative for apnea, hemoptysis, chest tightness, wheezing and stridor.   Cardiovascular:  Negative for chest pain and palpitations.  Musculoskeletal:  Positive for arthralgias and myalgias.  Neurological:  Negative for headaches.    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   Arthritis 06/06/2014   Aggrieved 06/06/2014   Dermatitis, eczematoid 06/06/2014   Recurrent major depressive episodes (HCC) 06/06/2014   Essential (primary) hypertension 06/06/2014   Gastro-esophageal reflux disease without esophagitis 06/06/2014   Cannot sleep 06/06/2014   Menopause 06/06/2014    No Known Allergies  Past Surgical History:  Procedure Laterality Date   ABDOMINAL HYSTERECTOMY     APPLICATION OF WOUND VAC  11/01/2019   Procedure:  APPLICATION OF WOUND VAC;  Surgeon: Rodolph Romano, MD;  Location: [facility] ORS;  Service: General;;   BREAST BIOPSY Right 06/25/2017   ductal carcinoma in situ involving a papilloma and encapsulated papillary carcinoma   BREAST BIOPSY Left 10/12/2019   affirm bx of calcs, x marker, path pending   BREAST BIOPSY Left 2006   DCIS   BREAST LUMPECTOMY Right 07/09/2017   DCIS, clear  margins   BREAST LUMPECTOMY Left 2006   DCIS   BREAST LUMPECTOMY WITH NEEDLE LOCALIZATION Right 07/09/2017   Procedure: BREAST LUMPECTOMY WITH NEEDLE LOCALIZATION;  Surgeon: Rodolph Romano, MD;  Location: [facility];  Service: General;  Laterality: Right;   BREAST RECONSTRUCTION WITH PLACEMENT OF TISSUE EXPANDER AND FLEX HD (ACELLULAR HYDRATED DERMIS) Bilateral 11/01/2019   Procedure: BREAST RECONSTRUCTION WITH PLACEMENT OF TISSUE EXPANDER AND FLEX HD (ACELLULAR HYDRATED DERMIS);  Surgeon: Elisabeth Craig RAMAN, MD;  Location: [facility];  Service: Plastics;  Laterality: Bilateral;   BREAST SURGERY     left breast lumpectomy   CARDIOVERSION N/A 03/21/2016   Procedure: Cardioversion;  Surgeon: Wolm JINNY Rhyme, MD;  Location: [facility];  Service: Cardiovascular;  Laterality: N/A;   CATARACT EXTRACTION, BILATERAL     DEBRIDEMENT AND CLOSURE WOUND Bilateral 11/26/2019   Procedure: Excision of bilateral mastectomy flap necrosis with complex closure;  Surgeon: Elisabeth Craig RAMAN, MD;  Location: [facility] OR;  Service: Plastics;  Laterality: Bilateral;  1.5 hours total   LAPAROSCOPIC CHOLECYSTECTOMY     TISSUE EXPANDER PLACEMENT Bilateral 11/26/2019   Procedure: exchange of tissue expanders;  Surgeon: Elisabeth Craig RAMAN, MD;  Location: [facility] OR;  Service: Plastics;  Laterality: Bilateral;   TISSUE EXPANDER PLACEMENT Bilateral 02/01/2020   Procedure: removal of bilateral expander with closure;  Surgeon: Elisabeth Craig RAMAN, MD;  Location: [facility] OR;  Service: Plastics;  Laterality: Bilateral;   TONSILLECTOMY     TOTAL MASTECTOMY Bilateral 11/01/2019   Procedure: TOTAL MASTECTOMY w/ immediate reconstruction and Sentinel Node Biopsy;  Surgeon: Rodolph Romano, MD;  Location: [facility] OR[facility];  Service: General;  Laterality: Bilateral;   VAGINAL HYSTERECTOMY      Social History   Tobacco Use   Smoking status: Never   Smokeless tobacco: Never  Vaping Use   Vaping status: Never Used  Substance Use Topics   Alcohol use: Never    Drug use: No     Medication list has been reviewed and updated.  No outpatient medications have been marked as taking for the 02/03/23 encounter (Office Visit) with Joshua Cathryne BROCKS, MD.       07/19/2022   10:50 AM 01/18/2022   10:45 AM 10/29/2021   11:14 AM 07/17/2021   10:54 AM  GAD 7 : Generalized Anxiety Score  Nervous, Anxious, on Edge 0 0 0 1  Control/stop worrying 0 0 0 0  Worry too much - different things 0 0 0 0  Trouble relaxing 0 0 0 0  Restless 0 0 0 2  Easily annoyed or irritable 0 0 0 2  Afraid - awful might happen 0 0 0 0  Total GAD 7 Score 0 0 0 5  Anxiety Difficulty Not difficult at all Not difficult at all Not difficult at all Somewhat difficult       07/19/2022   10:50 AM 01/18/2022   10:45 AM 12/28/2021   11:28 AM  Depression screen PHQ 2/9  Decreased Interest 0 0 0  Down, Depressed, Hopeless 0 2 0  PHQ - 2 Score 0 2 0  Altered sleeping 0  0   Tired, decreased energy 0 0   Change in appetite 0 0   Feeling bad or failure about yourself  0 0   Trouble concentrating 0 0   Moving slowly or fidgety/restless 0 0   Suicidal thoughts 0 0   PHQ-9 Score 0 2   Difficult doing work/chores Not difficult at all Somewhat difficult     BP Readings from Last 3 Encounters:  02/03/23 124/77  11/15/22 118/78  07/19/22 122/64    Physical Exam Vitals and nursing note reviewed.  Constitutional:      General: She is not in acute distress.    Appearance: She is not diaphoretic.  HENT:     Head: Normocephalic and atraumatic.     Right Ear: External ear normal.     Left Ear: External ear normal.     Nose: Nose normal.     Mouth/Throat:     Mouth: Mucous membranes are moist.  Eyes:     General:        Right eye: No discharge.        Left eye: No discharge.     Extraocular Movements: Extraocular movements intact.     Conjunctiva/sclera: Conjunctivae normal.     Pupils: Pupils are equal, round, and reactive to light.  Neck:     Thyroid : No thyromegaly.      Vascular: No JVD.  Cardiovascular:     Rate and Rhythm: Normal rate and regular rhythm.     Heart sounds: Normal heart sounds. No murmur heard.    No friction rub. No gallop.  Pulmonary:     Effort: Pulmonary effort is normal.     Breath sounds: Rales present. No wheezing or rhonchi.     Comments: Noted bilateral basilar rales. Abdominal:     General: Bowel sounds are normal.     Palpations: Abdomen is soft. There is no mass.     Tenderness: There is no abdominal tenderness. There is no guarding.  Musculoskeletal:        General: Normal range of motion.     Cervical back: Normal range of motion and neck supple.  Lymphadenopathy:     Cervical: No cervical adenopathy.  Skin:    General: Skin is warm and dry.  Neurological:     Mental Status: She is alert.     Deep Tendon Reflexes: Reflexes are normal and symmetric.     Wt Readings from Last 3 Encounters:  02/03/23 234 lb (106.1 kg)  11/15/22 234 lb (106.1 kg)  07/19/22 234 lb (106.1 kg)    BP 124/77   Pulse 69   Temp 98.2 F (36.8 C) (Oral)   Ht 5' 5 (1.651 m)   Wt 234 lb (106.1 kg)   SpO2 97%   BMI 38.94 kg/m   Assessment and Plan:  1. Flu-like symptoms (Primary) Patient with flulike symptoms with myalgias arthralgias nonproductive cough and general malaise.  Overall fatigue with muscle weakness.  We will obtain influenza and COVID testing. - POCT Influenza A/B - POC COVID-19 BinaxNow - CBC with Differential/Platelet  2. 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  Patient's been encouraged to use fluids for hydration status we will obtain a chest x-ray since there is some bilateral rales noted on pulmonary exam. -  Renal Function Panel - CBC with Differential/Platelet - DG Chest 2 View  Pending  renal function panel to assess GFR we will likely initiate Paxlovid as well as if there is a bilateral pneumonia we may treat for an atypical/mycoplasma disorder. Reviewing information on Cy, I am notes that the chest x-ray is unremarkable for pneumonia however patient is on 2 medications at 1 time once she is not taking anymore and that would be Tambocor which she is on Eliquis  for preventative atrial fibrillation clots.  Looking at the risk benefit we will call in molnupiravir  so that she will not have to stop this medication but if the price is prohibitive seen that she has had 48 hours of this may be more the risk would be greater to take the medication particularly if it is extremely expensive versus treating this symptomatically at this time and I will leave this up to discussion with her daughter as to what would be the best course of action.  We will of course check patient on as-needed basis I am here tomorrow and I will probably be here Thursday Friday if necessary. Cathryne Molt, MD

## 2023-02-04 NOTE — Telephone Encounter (Signed)
 Requested medication (s) are due for refill today - no  Requested medication (s) are on the active medication list -yes  Future visit scheduled -no  Last refill: 02/03/23  Notes to clinic:   Pharmacy comment: Alternative Requested:NOT COVERED TRY PAXLOVID.    Requested Prescriptions  Pending Prescriptions Disp Refills   LAGEVRIO  200 MG CAPS capsule [Pharmacy Med Name: LAGEVRIO  200 MG CAP (EUA-COMM)] 40 capsule 0    Sig: TAKE 4 CAPSULES (800 MG TOTAL) BY MOUTH TWICE A DAY FOR 5 DAYS     Off-Protocol Failed - 02/04/2023 10:08 AM      Failed - Medication not assigned to a protocol, review manually.      Passed - Valid encounter within last 12 months    Recent Outpatient Visits           Yesterday Flu-like symptoms   [facility] Joshua Cathryne JAYSON, MD   2 months ago Essential hypertension   [place] [facility] Joshua Cathryne JAYSON, MD   6 months ago Essential hypertension   [facility] [facility] Joshua Cathryne JAYSON, MD   1 year ago Essential hypertension   [place] [facility] Joshua Cathryne JAYSON, MD   1 year ago Degenerative lumbar disc   [place] [facility] [facility], MD                 Requested Prescriptions  Pending Prescriptions Disp Refills   LAGEVRIO  200 MG CAPS capsule [Pharmacy Med Name: LAGEVRIO  200 MG CAP (EUA-COMM)] 40 capsule 0    Sig: TAKE 4 CAPSULES (800 MG TOTAL) BY MOUTH TWICE A DAY FOR 5 DAYS     Off-Protocol Failed - 02/04/2023 10:08 AM      Failed - Medication not assigned to a protocol, review manually.      Passed - Valid encounter within last 12 months    Recent Outpatient Visits           Yesterday Flu-like symptoms   [place] [facility] Joshua Cathryne JAYSON, MD   2 months ago Essential  hypertension   [place] [facility] Joshua Cathryne JAYSON, MD   6 months ago Essential hypertension   [place] [facility] Joshua Cathryne JAYSON, MD   1 year ago Essential hypertension   [place] [facility] Joshua Cathryne JAYSON, MD   1 year ago Degenerative lumbar disc   [place] [facility] Joshua Cathryne JAYSON, MD

## 2023-02-14 ENCOUNTER — Telehealth: Payer: Medicare HMO | Admitting: Physician Assistant

## 2023-02-14 DIAGNOSIS — R051 Acute cough: Secondary | ICD-10-CM

## 2023-02-14 DIAGNOSIS — U071 COVID-19: Secondary | ICD-10-CM

## 2023-02-14 NOTE — Progress Notes (Signed)
  Because you have recently had Covid 19 and having a worsening cough, I feel your condition warrants further evaluation and I recommend that you be seen in a face-to-face visit. It may be best to have a Chest X-ray to rule out pneumonia.    NOTE: There will be NO CHARGE for this E-Visit   If you are having a true medical emergency, please call 911.     For an urgent face to face visit, [place] has multiple urgent care centers for your convenience.  Click the link below for the full list of locations and hours, walk-in wait times, appointment scheduling options and driving directions:  Urgent Care - [place], [place], [place], [place], [place], [place]  [place]     Your MyChart E-visit questionnaire answers were reviewed by a board certified advanced clinical practitioner to complete your personal care plan based on your specific symptoms.    Thank you for using e-Visits.     I have spent 5 minutes in review of e-visit questionnaire, review and updating patient chart, medical decision making and response to patient.   Margaretann Loveless, PA-C

## 2023-02-26 DIAGNOSIS — I10 Essential (primary) hypertension: Secondary | ICD-10-CM | POA: Diagnosis not present

## 2023-02-26 DIAGNOSIS — E782 Mixed hyperlipidemia: Secondary | ICD-10-CM | POA: Diagnosis not present

## 2023-02-26 DIAGNOSIS — I480 Paroxysmal atrial fibrillation: Secondary | ICD-10-CM | POA: Diagnosis not present

## 2023-03-11 DIAGNOSIS — J069 Acute upper respiratory infection, unspecified: Secondary | ICD-10-CM | POA: Diagnosis not present

## 2023-03-19 DIAGNOSIS — F5101 Primary insomnia: Secondary | ICD-10-CM | POA: Diagnosis not present

## 2023-03-19 DIAGNOSIS — G309 Alzheimer's disease, unspecified: Secondary | ICD-10-CM | POA: Diagnosis not present

## 2023-03-19 DIAGNOSIS — Z8616 Personal history of COVID-19: Secondary | ICD-10-CM | POA: Diagnosis not present

## 2023-03-19 DIAGNOSIS — F0150 Vascular dementia without behavioral disturbance: Secondary | ICD-10-CM | POA: Diagnosis not present

## 2023-03-19 DIAGNOSIS — F0280 Dementia in other diseases classified elsewhere without behavioral disturbance: Secondary | ICD-10-CM | POA: Diagnosis not present

## 2023-04-03 ENCOUNTER — Other Ambulatory Visit: Payer: Self-pay | Admitting: Family Medicine

## 2023-04-03 DIAGNOSIS — I10 Essential (primary) hypertension: Secondary | ICD-10-CM

## 2023-04-08 DIAGNOSIS — M5441 Lumbago with sciatica, right side: Secondary | ICD-10-CM | POA: Diagnosis not present

## 2023-04-15 DIAGNOSIS — M545 Low back pain, unspecified: Secondary | ICD-10-CM | POA: Diagnosis not present

## 2023-04-15 DIAGNOSIS — G7000 Myasthenia gravis without (acute) exacerbation: Secondary | ICD-10-CM | POA: Diagnosis not present

## 2023-04-15 DIAGNOSIS — M4807 Spinal stenosis, lumbosacral region: Secondary | ICD-10-CM | POA: Diagnosis not present

## 2023-04-15 DIAGNOSIS — M9923 Subluxation stenosis of neural canal of lumbar region: Secondary | ICD-10-CM | POA: Diagnosis not present

## 2023-04-16 ENCOUNTER — Other Ambulatory Visit: Payer: Self-pay | Admitting: Orthopedic Surgery

## 2023-04-16 DIAGNOSIS — M4807 Spinal stenosis, lumbosacral region: Secondary | ICD-10-CM

## 2023-04-23 ENCOUNTER — Ambulatory Visit
Admission: RE | Admit: 2023-04-23 | Discharge: 2023-04-23 | Disposition: A | Discharge status: Home / Self Care | Source: Ambulatory Visit | Attending: Student | Admitting: Student

## 2023-04-23 ENCOUNTER — Ambulatory Visit
Admission: RE | Admit: 2023-04-23 | Discharge: 2023-04-23 | Disposition: A | Discharge status: Home / Self Care | Source: Ambulatory Visit | Attending: Orthopedic Surgery | Admitting: Orthopedic Surgery

## 2023-04-23 DIAGNOSIS — H02402 Unspecified ptosis of left eyelid: Secondary | ICD-10-CM

## 2023-04-23 DIAGNOSIS — G309 Alzheimer's disease, unspecified: Secondary | ICD-10-CM | POA: Diagnosis not present

## 2023-04-23 DIAGNOSIS — M4807 Spinal stenosis, lumbosacral region: Secondary | ICD-10-CM | POA: Insufficient documentation

## 2023-04-23 DIAGNOSIS — R4189 Other symptoms and signs involving cognitive functions and awareness: Secondary | ICD-10-CM | POA: Diagnosis not present

## 2023-04-23 DIAGNOSIS — F0280 Dementia in other diseases classified elsewhere without behavioral disturbance: Secondary | ICD-10-CM | POA: Insufficient documentation

## 2023-04-23 DIAGNOSIS — M5125 Other intervertebral disc displacement, thoracolumbar region: Secondary | ICD-10-CM | POA: Diagnosis not present

## 2023-04-23 DIAGNOSIS — F0150 Vascular dementia without behavioral disturbance: Secondary | ICD-10-CM | POA: Diagnosis not present

## 2023-04-23 DIAGNOSIS — M5135 Other intervertebral disc degeneration, thoracolumbar region: Secondary | ICD-10-CM | POA: Diagnosis not present

## 2023-04-23 DIAGNOSIS — M47816 Spondylosis without myelopathy or radiculopathy, lumbar region: Secondary | ICD-10-CM | POA: Diagnosis not present

## 2023-04-23 DIAGNOSIS — M48061 Spinal stenosis, lumbar region without neurogenic claudication: Secondary | ICD-10-CM | POA: Diagnosis not present

## 2023-04-28 DIAGNOSIS — M5416 Radiculopathy, lumbar region: Secondary | ICD-10-CM | POA: Diagnosis not present

## 2023-04-28 DIAGNOSIS — G8929 Other chronic pain: Secondary | ICD-10-CM | POA: Diagnosis not present

## 2023-04-28 DIAGNOSIS — M5441 Lumbago with sciatica, right side: Secondary | ICD-10-CM | POA: Diagnosis not present

## 2023-04-28 DIAGNOSIS — M5442 Lumbago with sciatica, left side: Secondary | ICD-10-CM | POA: Diagnosis not present

## 2023-05-04 ENCOUNTER — Other Ambulatory Visit: Payer: Self-pay | Admitting: Family Medicine

## 2023-05-04 DIAGNOSIS — F339 Major depressive disorder, recurrent, unspecified: Secondary | ICD-10-CM

## 2023-05-15 DIAGNOSIS — M5416 Radiculopathy, lumbar region: Secondary | ICD-10-CM | POA: Diagnosis not present

## 2023-05-25 ENCOUNTER — Other Ambulatory Visit: Payer: Self-pay | Admitting: Family Medicine

## 2023-05-25 DIAGNOSIS — F339 Major depressive disorder, recurrent, unspecified: Secondary | ICD-10-CM

## 2023-06-26 DIAGNOSIS — I480 Paroxysmal atrial fibrillation: Secondary | ICD-10-CM | POA: Diagnosis not present

## 2023-06-26 DIAGNOSIS — E782 Mixed hyperlipidemia: Secondary | ICD-10-CM | POA: Diagnosis not present

## 2023-06-26 DIAGNOSIS — I10 Essential (primary) hypertension: Secondary | ICD-10-CM | POA: Diagnosis not present

## 2023-08-11 NOTE — Progress Notes (Signed)
 [facility] Quality Team Note  Name: Pheobe Sandiford Date of Birth: 1941-01-06 MRN: [id number] Date: 08/11/2023  [facility] Quality Team has reviewed this patient's chart, please see recommendations below:  [facility] Quality Other; (CHART REVIEWED FOR CBP, ABSTRACTED FOR GAP CLOSURE.)

## 2023-09-03 DIAGNOSIS — L304 Erythema intertrigo: Secondary | ICD-10-CM | POA: Diagnosis not present

## 2023-09-19 DIAGNOSIS — F5101 Primary insomnia: Secondary | ICD-10-CM | POA: Diagnosis not present

## 2023-09-19 DIAGNOSIS — R413 Other amnesia: Secondary | ICD-10-CM | POA: Diagnosis not present

## 2023-09-24 DIAGNOSIS — I10 Essential (primary) hypertension: Secondary | ICD-10-CM | POA: Diagnosis not present

## 2023-09-24 DIAGNOSIS — E782 Mixed hyperlipidemia: Secondary | ICD-10-CM | POA: Diagnosis not present

## 2023-09-24 DIAGNOSIS — I480 Paroxysmal atrial fibrillation: Secondary | ICD-10-CM | POA: Diagnosis not present

## 2023-09-24 DIAGNOSIS — Z5181 Encounter for therapeutic drug level monitoring: Secondary | ICD-10-CM | POA: Diagnosis not present

## 2023-09-24 DIAGNOSIS — Z79899 Other long term (current) drug therapy: Secondary | ICD-10-CM | POA: Diagnosis not present

## 2023-12-24 DIAGNOSIS — Z79899 Other long term (current) drug therapy: Secondary | ICD-10-CM | POA: Diagnosis not present

## 2023-12-24 DIAGNOSIS — Z5181 Encounter for therapeutic drug level monitoring: Secondary | ICD-10-CM | POA: Diagnosis not present

## 2023-12-24 DIAGNOSIS — I10 Essential (primary) hypertension: Secondary | ICD-10-CM | POA: Diagnosis not present

## 2023-12-24 DIAGNOSIS — E782 Mixed hyperlipidemia: Secondary | ICD-10-CM | POA: Diagnosis not present

## 2023-12-24 DIAGNOSIS — I480 Paroxysmal atrial fibrillation: Secondary | ICD-10-CM | POA: Diagnosis not present

## 2023-12-29 DIAGNOSIS — M19042 Primary osteoarthritis, left hand: Secondary | ICD-10-CM | POA: Diagnosis not present
